# Patient Record
Sex: Male | Born: 1950
Health system: Southern US, Community
[De-identification: ages and names within clinical notes are randomized; demographics above are authoritative.]

## PROBLEM LIST (undated history)

## (undated) DIAGNOSIS — F10231 Alcohol dependence with withdrawal delirium: Secondary | ICD-10-CM

## (undated) DIAGNOSIS — I10 Essential (primary) hypertension: Secondary | ICD-10-CM

## (undated) DIAGNOSIS — E876 Hypokalemia: Secondary | ICD-10-CM

## (undated) DIAGNOSIS — E440 Moderate protein-calorie malnutrition: Secondary | ICD-10-CM

## (undated) HISTORY — PX: NO PAST SURGERIES: SHX2092

---
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
## 2015-03-06 ENCOUNTER — Encounter (HOSPITAL_COMMUNITY): Payer: Self-pay | Admitting: *Deleted

## 2015-03-06 ENCOUNTER — Emergency Department (HOSPITAL_COMMUNITY)
Admission: EM | Admit: 2015-03-06 | Discharge: 2015-03-06 | Disposition: A | Discharge status: Home / Self Care | Payer: Self-pay | Attending: Emergency Medicine | Admitting: Emergency Medicine

## 2015-03-06 DIAGNOSIS — Y9389 Activity, other specified: Secondary | ICD-10-CM | POA: Insufficient documentation

## 2015-03-06 DIAGNOSIS — Y9289 Other specified places as the place of occurrence of the external cause: Secondary | ICD-10-CM | POA: Insufficient documentation

## 2015-03-06 DIAGNOSIS — W458XXA Other foreign body or object entering through skin, initial encounter: Secondary | ICD-10-CM | POA: Insufficient documentation

## 2015-03-06 DIAGNOSIS — Y998 Other external cause status: Secondary | ICD-10-CM | POA: Insufficient documentation

## 2015-03-06 DIAGNOSIS — I10 Essential (primary) hypertension: Secondary | ICD-10-CM | POA: Insufficient documentation

## 2015-03-06 DIAGNOSIS — S30852A Superficial foreign body of penis, initial encounter: Secondary | ICD-10-CM | POA: Insufficient documentation

## 2015-03-06 DIAGNOSIS — S3994XA Unspecified injury of external genitals, initial encounter: Secondary | ICD-10-CM

## 2015-03-06 DIAGNOSIS — Z23 Encounter for immunization: Secondary | ICD-10-CM | POA: Insufficient documentation

## 2015-03-06 LAB — I-STAT CHEM 8, ED
BUN: 14 mg/dL (ref 6–20)
CALCIUM ION: 1.14 mmol/L (ref 1.13–1.30)
CHLORIDE: 103 mmol/L (ref 101–111)
CREATININE: 1 mg/dL (ref 0.61–1.24)
GLUCOSE: 117 mg/dL — AB (ref 65–99)
HCT: 47 % (ref 39.0–52.0)
Hemoglobin: 16 g/dL (ref 13.0–17.0)
POTASSIUM: 3.6 mmol/L (ref 3.5–5.1)
Sodium: 142 mmol/L (ref 135–145)
TCO2: 25 mmol/L (ref 0–100)

## 2015-03-06 LAB — CBC WITH DIFFERENTIAL/PLATELET
Basophils Absolute: 0 10*3/uL (ref 0.0–0.1)
Basophils Relative: 1 %
EOS PCT: 1 %
Eosinophils Absolute: 0.1 10*3/uL (ref 0.0–0.7)
HCT: 40.1 % (ref 39.0–52.0)
Hemoglobin: 13.7 g/dL (ref 13.0–17.0)
LYMPHS ABS: 1.6 10*3/uL (ref 0.7–4.0)
LYMPHS PCT: 32 %
MCH: 30.9 pg (ref 26.0–34.0)
MCHC: 34.2 g/dL (ref 30.0–36.0)
MCV: 90.3 fL (ref 78.0–100.0)
MONO ABS: 0.4 10*3/uL (ref 0.1–1.0)
Monocytes Relative: 8 %
Neutro Abs: 2.8 10*3/uL (ref 1.7–7.7)
Neutrophils Relative %: 58 %
PLATELETS: 263 10*3/uL (ref 150–400)
RBC: 4.44 MIL/uL (ref 4.22–5.81)
RDW: 14.6 % (ref 11.5–15.5)
WBC: 4.9 10*3/uL (ref 4.0–10.5)

## 2015-03-06 MED ORDER — TETANUS-DIPHTH-ACELL PERTUSSIS 5-2.5-18.5 LF-MCG/0.5 IM SUSP
0.5000 mL | Freq: Once | INTRAMUSCULAR | Status: AC
  Administered 2015-03-06: 0.5 mL via INTRAMUSCULAR
  Filled 2015-03-06: qty 0.5

## 2015-03-06 MED ORDER — CEPHALEXIN 500 MG PO CAPS: 500.0000 mg | ORAL_CAPSULE | Freq: Four times a day (QID) | ORAL | Status: DC

## 2015-03-06 MED ORDER — HYDROCHLOROTHIAZIDE 25 MG PO TABS: 25.0000 mg | ORAL_TABLET | Freq: Every day | ORAL | Status: DC

## 2015-03-06 MED ORDER — LIDOCAINE HCL (PF) 1 % IJ SOLN
30.0000 mL | Freq: Once | INTRAMUSCULAR | Status: AC
  Administered 2015-03-06: 30 mL
  Filled 2015-03-06: qty 30

## 2015-03-06 MED ORDER — BACITRACIN ZINC 500 UNIT/GM EX OINT
TOPICAL_OINTMENT | Freq: Two times a day (BID) | CUTANEOUS | Status: DC
  Administered 2015-03-06: 1 via TOPICAL
  Filled 2015-03-06: qty 0.9

## 2015-03-06 MED ORDER — HYDROCODONE-ACETAMINOPHEN 5-325 MG PO TABS: 1.0000 | ORAL_TABLET | ORAL | Status: DC | PRN

## 2015-03-06 MED ORDER — HYDROCHLOROTHIAZIDE 25 MG PO TABS
25.0000 mg | ORAL_TABLET | Freq: Every day | ORAL | Status: DC
  Administered 2015-03-06: 25 mg via ORAL
  Filled 2015-03-06: qty 1

## 2015-03-06 MED ORDER — BUPIVACAINE HCL (PF) 0.25 % IJ SOLN
20.0000 mL | Freq: Once | INTRAMUSCULAR | Status: DC
  Filled 2015-03-06: qty 20

## 2015-03-06 NOTE — ED Notes (Signed)
PA spoke with charge nurse. Will move pt to POD C when bed comes available.

## 2015-03-06 NOTE — Consult Note (Signed)
Urology Consult  Referring physician: Clayton Bibles, PA-C Reason for referral: Penile zipper injury  Chief Complaint: Zipper stuck to penile skin  History of Present Illness:  64 yo male who was zipping his pants yesterday at noon and caught his right distal penile shaft skin in his zipper. He was able to remove a portion of it over the past 24 hours but was unable to remove the last 2 cm of zipper and presents today. This has never happened before. The penis and zipper are tender to palpation. He denies fevers, dysuria, hematuria or other problems. He is circumcised.  History reviewed. No pertinent past medical history. History reviewed. No pertinent past surgical history.  Medications: I have reviewed the patient's current medications. Allergies: No Known Allergies  History reviewed. No pertinent family history. Social History:  reports that he has never smoked. He does not have any smokeless tobacco history on file. He reports that he drinks alcohol. He reports that he does not use illicit drugs.  ROS 12 system review was performed and was negative except for the pertinent positives listed in the HPI.  Physical Exam:  Vital signs in last 24 hours: Temp:  [98.3 F (36.8 C)-98.8 F (37.1 C)] 98.8 F (37.1 C) (11/04 1534) Pulse Rate:  [97-115] 115 (11/04 1534) Resp:  [16-18] 16 (11/04 1534) BP: (211-224)/(112-114) 224/112 mmHg (11/04 1534) SpO2:  [99 %-100 %] 99 % (11/04 1534) Weight:  [57.153 kg (126 lb)] 57.153 kg (126 lb) (11/04 1129) Physical Exam Genitourinary: Metal zipper is zipped to right distal shaft skin   Laboratory Data:  Results for orders placed or performed during the hospital encounter of 03/06/15 (from the past 72 hour(s))  CBC with Differential     Status: None   Collection Time: 03/06/15 12:54 PM  Result Value Ref Range   WBC 4.9 4.0 - 10.5 K/uL   RBC 4.44 4.22 - 5.81 MIL/uL   Hemoglobin 13.7 13.0 - 17.0 g/dL   HCT 40.1 39.0 - 52.0 %   MCV 90.3 78.0 - 100.0  fL   MCH 30.9 26.0 - 34.0 pg   MCHC 34.2 30.0 - 36.0 g/dL   RDW 14.6 11.5 - 15.5 %   Platelets 263 150 - 400 K/uL   Neutrophils Relative % 58 %   Neutro Abs 2.8 1.7 - 7.7 K/uL   Lymphocytes Relative 32 %   Lymphs Abs 1.6 0.7 - 4.0 K/uL   Monocytes Relative 8 %   Monocytes Absolute 0.4 0.1 - 1.0 K/uL   Eosinophils Relative 1 %   Eosinophils Absolute 0.1 0.0 - 0.7 K/uL   Basophils Relative 1 %   Basophils Absolute 0.0 0.0 - 0.1 K/uL  I-stat chem 8, ed     Status: Abnormal   Collection Time: 03/06/15  1:15 PM  Result Value Ref Range   Sodium 142 135 - 145 mmol/L   Potassium 3.6 3.5 - 5.1 mmol/L   Chloride 103 101 - 111 mmol/L   BUN 14 6 - 20 mg/dL   Creatinine, Ser 1.00 0.61 - 1.24 mg/dL   Glucose, Bld 117 (H) 65 - 99 mg/dL   Calcium, Ion 1.14 1.13 - 1.30 mmol/L   TCO2 25 0 - 100 mmol/L   Hemoglobin 16.0 13.0 - 17.0 g/dL   HCT 47.0 39.0 - 52.0 %   No results found for this or any previous visit (from the past 240 hour(s)). Creatinine:  Recent Labs  03/06/15 1315  CREATININE 1.00    Impression/Assessment:  64  yo M with zipper stuck to right lateral penile shaft skin.   Procedure: The patient was prepped and draped with betadine and sterile towels at the bedside. We performed a dorsal and ring block with 20 mL of 0.25% marcaine plain. We were able to slowly manipulate the zipper off the preputial skin with a resulting 4 cm x 0.5 cm defect in the skin. Additional betadine was used to clean the defect. The edges were debrided sharply and excellent hemostasis was achieved with eye cautery. The incision was closed vertically with interrupted 4-0 chromic with excellent hemostasis and cosmetic effect. We made sure to sharply trim the dog ear at each end prior to closing it. This was dressed with bacitracin, xeroform gauze, kerlix and coban.  Plan:  1. Discharge to home on 7 day course of Keflex or bactrim 2. Remove dressing tomorrow 3. Ok to use ice packs as needed over next 24  hours 4. Ok to shower no bathing for 2 weeks 5. No sexual activity for 4 weeks 6. Follow up with Dr. Junious Silk in 1-2 weeks for a post-op check  Acie Fredrickson 03/06/2015, 3:48 PM

## 2015-03-06 NOTE — ED Provider Notes (Signed)
CSN: [id number]     Arrival date & time 03/06/15  1121 History  By signing my name below, I, Soijett Blue, attest that this documentation has been prepared under the direction and in the presence of Clayton Bibles, PA-C Electronically Signed: Soijett Blue, ED Scribe. 03/06/2015. 12:46 PM.   Chief Complaint  Patient presents with  . Penis Injury      The history is provided by the patient. No language interpreter was used.    HPI Comments: Dan Henry is a 64 y.o. male who presents to the Emergency Department complaining of penis injury onset yesterday. He reports that he was zipping his pants up when he accidentally got his penis caught on the zipper. He notes that he does not have any pain at this time and that he cut off his pants but the zipper is still attached to his penis. He states that he has not tried any medications for the relief for his symptoms. He denies CP, SOB, confusion, weakness or numbness of the extremities, and any other symptoms. He denies having a PCP at this time and he denies ever taking medication for his high blood pressure.   History reviewed. No pertinent past medical history. History reviewed. No pertinent past surgical history. History reviewed. No pertinent family history. Social History  Substance Use Topics  . Smoking status: Never Smoker   . Smokeless tobacco: None  . Alcohol Use: Yes     Comment: occ    Review of Systems  Constitutional: Negative for fever.  Genitourinary: Negative for scrotal swelling and testicular pain.       Zipped up penis with zipper attached  Skin: Negative for color change and rash.  Allergic/Immunologic: Negative for immunocompromised state.  Neurological: Negative for numbness.  Hematological: Does not bruise/bleed easily.  Psychiatric/Behavioral: Negative for self-injury (accidental).      Allergies  Review of patient's allergies indicates no known allergies.  Home Medications   Prior to Admission medications    Not on File   BP 211/112 mmHg  Pulse 97  Temp(Src) 98.3 F (36.8 C) (Oral)  Resp 18  Ht 5\' 5"  (1.651 m)  Wt 126 lb (57.153 kg)  BMI 20.97 kg/m2  SpO2 100% Physical Exam  Constitutional: He appears well-developed and well-nourished. No distress.  HENT:  Head: Normocephalic and atraumatic.  Neck: Neck supple.  Pulmonary/Chest: Effort normal.  Genitourinary:     Metal zipper zipped into foreskin of right side of penis, front of zipper is flush with skin.   Neurological: He is alert.  Skin: He is not diaphoretic.  Nursing note and vitals reviewed.   ED Course  Procedures (including critical care time) DIAGNOSTIC STUDIES: Oxygen Saturation is 100% on RA, nl by my interpretation.    COORDINATION OF CARE: 12:36 PM Discussed treatment plan with pt at bedside which includes I-stat Chem 8, consult with attending Dr. Colin Rhein and pt agreed to plan.  12:49 PM-Dr Colin Rhein has seen and evaluated the patient, advised urology consultation.    1:20 PM- Consult with Urologist, Dr. Junious Silk who recommends that the pt be made NPO and he will see the pt in the ED   Labs Review Labs Reviewed  I-STAT CHEM 8, ED - Abnormal; Notable for the following:    Glucose, Bld 117 (*)    All other components within normal limits  CBC WITH DIFFERENTIAL/PLATELET    Imaging Review No results found. I have personally reviewed and evaluated these lab results as part of my medical decision-making.  EKG Interpretation None         MDM   Final diagnoses:  Superficial foreign body of penis, initial encounter  Essential hypertension  Penile trauma, initial encounter    Afebrile, nontoxic patient with accidental trauma to penis with skin of penis caught in zipper.  Please see urology note regarding repair of this, performed in ED.  Pt also noted to be hypertensive.  Pt asymptomatic.  Started on HCTZ.   D/C home with HCTZ, keflex per urology, norco, urology follow up - instructions per urology.   Discussed result, findings, treatment, and follow up  with patient.  Pt given return precautions.  Pt verbalizes understanding and agrees with plan.     I personally performed the services described in this documentation, which was scribed in my presence. The recorded information has been reviewed and is accurate.    Clayton Bibles, PA-C 03/06/15 1703  Debby Freiberg, MD 03/12/15 [phone]

## 2015-03-06 NOTE — Discharge Instructions (Signed)
Read the information below.  Use the prescribed medication as directed.  Please discuss all new medications with your pharmacist.  Do not take additional tylenol while taking the prescribed pain medication to avoid overdose.  You may return to the Emergency Department at any time for worsening condition or any new symptoms that concern you.    If you develop redness, swelling, pus draining from the wound, or fevers greater than 100.4, return to the ER immediately for a recheck.    Keep the bandage on for the next 24 hours.  You may shower but do not bathe until you see the urologist Dr Junious Silk in his office. No sexual activity for the next 6 weeks.    Please have your blood pressure rechecked within the week.  Follow up with a primary care provider as soon as possible to discuss this.  Hypertension Hypertension is another name for high blood pressure. High blood pressure forces your heart to work harder to pump blood. A blood pressure reading has two numbers, which includes a higher number over a lower number (example: 110/72). HOME CARE   Have your blood pressure rechecked by your doctor.  Only take medicine as told by your doctor. Follow the directions carefully. The medicine does not work as well if you skip doses. Skipping doses also puts you at risk for problems.  Do not smoke.  Monitor your blood pressure at home as told by your doctor. GET HELP IF:  You think you are having a reaction to the medicine you are taking.  You have repeat headaches or feel dizzy.  You have puffiness (swelling) in your ankles.  You have trouble with your vision. GET HELP RIGHT AWAY IF:   You get a very bad headache and are confused.  You feel weak, numb, or faint.  You get chest or belly (abdominal) pain.  You throw up (vomit).  You cannot breathe very well. MAKE SURE YOU:   Understand these instructions.  Will watch your condition.  Will get help right away if you are not doing well or  get worse.   This information is not intended to replace advice given to you by your health care provider. Make sure you discuss any questions you have with your health care provider.   Document Released: 10/05/2007 Document Revised: 04/23/2013 Document Reviewed: 02/08/2013 Elsevier Interactive Patient Education 2016 [employer].   Emergency Department Resource Guide 1) Find a Doctor and Pay Out of Pocket Although you won't have to find out who is covered by your insurance plan, it is a good idea to ask around and get recommendations. You will then need to call the office and see if the doctor you have chosen will accept you as a new patient and what types of options they offer for patients who are self-pay. Some doctors offer discounts or will set up payment plans for their patients who do not have insurance, but you will need to ask so you aren't surprised when you get to your appointment.  2) Contact Your Local Health Department Not all health departments have doctors that can see patients for sick visits, but many do, so it is worth a call to see if yours does. If you don't know where your local health department is, you can check in your phone book. The CDC also has a tool to help you locate your state's health department, and many state websites also have listings of all of their local health departments.  3) Find a  Walk-in Clinic If your illness is not likely to be very severe or complicated, you may want to try a walk in clinic. These are popping up all over the country in pharmacies, drugstores, and shopping centers. They're usually staffed by nurse practitioners or physician assistants that have been trained to treat common illnesses and complaints. They're usually fairly quick and inexpensive. However, if you have serious medical issues or chronic medical problems, these are probably not your best option.  No Primary Care Doctor: - Call Health Connect at  [phone] - they can help you  locate a primary care doctor that  accepts your insurance, provides certain services, etc. - Physician Referral Service- [phone]  Chronic Pain Problems: Organization         Address  Phone   Notes  [facility]  [phone] Patients need to be referred by their primary care doctor.   Medication Assistance: Organization         Address  Phone   Notes  [facility] Medication [facility] [address] [place], [place] [address] [phone] --Must be a resident of [facility] -- Must have NO insurance coverage whatsoever (no Medicaid/ Medicare, etc.) -- The pt. MUST have a primary care doctor that directs their care regularly and follows them in the community   [facility]ist  [phone]   [employer]  [phone]    Agencies that provide inexpensive medical care: Organization         Address  Phone   Notes  [place]  [phone]   [facility]    [phone]   [facility] [place], [place] [address] [phone]   [place] [address] [place] [phone]   Planned Parenthood    [phone]   [facility]    [phone]   [place] and [place] [address], [place] Phone:  [phone], Fax:  [phone] Hours of Operation:  9 am - 6 pm, M-F.  Also accepts Medicaid/Medicare and self-pay.  [facility] for [place], [address], [place] Phone: [phone], Fax: [phone]. Hours of Operation:  8:30 am - 5:30 pm, M-F.  Also accepts Medicaid and self-pay.  [facility] [address], [place] Phone: [phone]   [place], [place], [place] [phone], Ext. [phone] Mondays & Thursdays: 7-9 AM.  First 15 patients are seen on a first come, first serve basis.    [place]  Providers:  Organization         Address  Phone   Notes  [facility] [address], [place] [phone] Also accepts self-pay patients.  [facility] [address], [place]  [phone]   [place], [address], [place] [phone]   [facility] [address], [place] [phone]   Lucianne Lei [address], [place]   [phone] Only accepts [place] Access [place] patients after they have their name applied to their card.   Self-Pay (no insurance) in [facility]:  Organization         Address  Phone   Notes  Sickle Cell Patients, [facility] Internal [facility] [place],  [place] [phone]   [facility] [place] [phone]   [facility] [place], [place], [place] [phone]   [facility]/Dr. Osei-Bonsu  [address] [place] or [address], [place] [phone] Phone number for both [place] and [place] locations is the same.  Urgent Medical and [facility] [address] [place] [phone]   [facility] [address], [place] or [address] [phone]   [facility] [address], [place] [phone], phone; [phone], fax Sees patients 1st and 3rd Saturday of every month.  Must not qualify for public or private insurance (i.e. Medicaid, Medicare, [place] Health Choice, Veterans' Benefits)  Household income should be no more than 200% of the poverty level The clinic cannot treat you if you are pregnant or think you are pregnant  Sexually transmitted diseases are not treated at the clinic.    Dental Care: Organization         Address  Phone  Notes  [facility] Department of [facility] [place] [phone] Accepts children up to age 29 who are enrolled in [place] or [place]; pregnant women with a Medicaid card; and children who have applied for Medicaid or [place] Health Choice, but were declined, whose parents can pay a reduced fee at time of service.  [facility] Department of [facility]  [address], [place] [phone] Accepts children up to age 74 who are enrolled in [place] or [place]; pregnant women with a Medicaid card; and children who have applied for Medicaid or [place] Health Choice, but were declined, whose parents can pay a reduced fee at time of service.  [place] Adult Dental Access PROGRAM  [place] [phone] Patients are seen by appointment only. Walk-ins are not accepted. [place] will see patients 60 years of age and older. Monday - Tuesday (8am-5pm) Most Wednesdays (8:30-5pm) $30 per visit, cash only  [facility] Adult Dental Access PROGRAM  [address], [facility] [phone] Patients are seen by appointment only. Walk-ins are not accepted. [place] will see patients 67 years of age and older. One Wednesday Evening (Monthly: Volunteer Based).  $30 per visit, cash only  [place]  [phone] for adults; Children under age 37, call Graduate Pediatric Dentistry at [phone]. Children aged 16-14, please call [phone] to request a pediatric application.  Dental services are provided in all areas of dental care including fillings, crowns and bridges, complete and partial dentures, implants, gum treatment, root canals, and extractions. Preventive care is also provided. Treatment is provided to both adults and children. Patients are selected via a lottery and there is often a waiting list.   [facility] [address], [place]  [phone] [email or web address]   [facility]  [address] [place], [place] [phone], Ext. [phone] Second and Fourth Thursday of each month, opens at 6:30 AM; Clinic ends at 9 AM.  Patients are seen on a first-come first-served basis, and a limited number are seen during each clinic.   [facility]  [address] Hillard Danker [place], [place] [phone]   Eligibility Requirements You must have lived in [place], [place], or [place]  counties for at least the last three months.   You cannot be eligible for state or federal sponsored [employer], including [employer], [place], or [employer].   You generally cannot be eligible for healthcare insurance through your employer.    How to apply: Eligibility screenings are held every Tuesday and Wednesday afternoon from 1:00 pm until 4:00 pm. You do not need an appointment for the interview!  [facility] [address] [place], [place]   [place]  [place] Department  [place]  [phone]    Behavioral Health Resources in the Community: Intensive Outpatient Programs Organization         Address  Phone  Notes  [place] [place]. [address] [place], [place] [phone]   [facility] Outpatient [address] [place], [place]   ADS: Alcohol & Drug Svcs [address] [place], [place]   [place] [address]  [place], [place] or [phone]   Substance Abuse Resources Organization         Address  Phone  Notes  Alcohol and Drug Services  3084070831   [place]  [phone]   [facility] [place]   Chinita Pester  [phone]   Residential & Outpatient Substance Abuse Program  [phone]   Psychological Services Organization         Address  Phone  Notes  [facility] [place]  [place]  [phone]   [place] [address], [place] or [phone]    Mobile Crisis Teams Organization         Address  Phone  Notes  [facility]atives, Mobile Crisis Care Unit  [phone]   [facility]ive Psychotherapeutic Services  [address] [place], [place]   [place] [address] [place] [phone]    Self-Help/Support Groups Organization         Address  Phone             Notes  [place]. of [place] - variety of support groups  [place] Call for more information  Narcotics Anonymous (NA), [facility] [address], [employer] [place]  2 meetings at this location   Special educational needs teacher         Address  Phone  Notes  [facility] Residential Treatment [place],    [place]  [phone]   [facility]  [address], [place] [phone], [place], [place]   [place], [place] [phone] Admissions: 8am-3pm M-F  Incentives Substance [place] [address]    [place], [place] [phone]   [facility] [address] [place], [place], [place]   [facility] [address],  [place], [place]   [facility] Programs - Intensive Outpatient [address], [place], [place]   [facility] [place].,  [place], [place] or [phone]   [facility] Services (RTS) [address] [place], [place] Accepts Medicaid  [place] [address]  [place] [phone] Substance Abuse/Addiction Treatment   [facility] Resources Organization         Address  Phone  Notes  Lexicographer Services  (  [phone]   Domenic Schwab, PhD [address] [place], [place]   [phone] or [phone]    [name], [place] [phone]   [address], [place], [place] [phone] Insurance/Medicaid/sponsorship through [facility] and Families [address]                                     [place], [place] [phone] [place] [address], [place] [phone]    Dr. Adele Schilder  [phone]   [facility] Dept. 1) [address] [place] 2) [place] 3)  [address], [place] [phone]  [phone]   [place] [phone] or [phone] (After Hours)

## 2015-03-06 NOTE — ED Notes (Addendum)
Pt reports getting dressed yesterday, accidentally injured penis on zipper. Denies any bleeding, only pain. Hypertensive at triage.

## 2015-03-30 ENCOUNTER — Ambulatory Visit: Payer: Self-pay

## 2015-04-02 ENCOUNTER — Ambulatory Visit: Payer: Self-pay

## 2015-05-21 ENCOUNTER — Ambulatory Visit (INDEPENDENT_AMBULATORY_CARE_PROVIDER_SITE_OTHER): Payer: Self-pay | Admitting: Internal Medicine

## 2015-05-21 ENCOUNTER — Encounter: Payer: Self-pay | Admitting: Internal Medicine

## 2015-05-21 VITALS — BP 219/122 | HR 103 | Temp 97.8°F | Ht 65.5 in | Wt 121.3 lb

## 2015-05-21 DIAGNOSIS — I10 Essential (primary) hypertension: Secondary | ICD-10-CM

## 2015-05-21 DIAGNOSIS — Z0000 Encounter for general adult medical examination without abnormal findings: Secondary | ICD-10-CM

## 2015-05-21 MED ORDER — LISINOPRIL-HYDROCHLOROTHIAZIDE 10-12.5 MG PO TABS: 1.0000 | ORAL_TABLET | Freq: Every day | ORAL | Status: DC

## 2015-05-21 NOTE — Progress Notes (Signed)
Case discussed with Dr. Hoffman at the time of the visit.  We reviewed the resident's history and exam and pertinent patient test results.  I agree with the assessment, diagnosis and plan of care documented in the resident's note. 

## 2015-05-21 NOTE — Patient Instructions (Signed)
General Instructions:  I want you to start taking Lisinopril-HCTZ 10-12.5mg  daily and return in 2 weeks  Please bring your medicines with you each time you come to clinic.  Medicines may include prescription medications, over-the-counter medications, herbal remedies, eye drops, vitamins, or other pills.   Progress Toward Treatment Goals:  No flowsheet data found.  Self Care Goals & Plans:  No flowsheet data found.  No flowsheet data found.   Care Management & Community Referrals:  No flowsheet data found.     Hypertension Hypertension, commonly called high blood pressure, is when the force of blood pumping through your arteries is too strong. Your arteries are the blood vessels that carry blood from your heart throughout your body. A blood pressure reading consists of a higher number over a lower number, such as 110/72. The higher number (systolic) is the pressure inside your arteries when your heart pumps. The lower number (diastolic) is the pressure inside your arteries when your heart relaxes. Ideally you want your blood pressure below 120/80. Hypertension forces your heart to work harder to pump blood. Your arteries may become narrow or stiff. Having untreated or uncontrolled hypertension can cause heart attack, stroke, kidney disease, and other problems. RISK FACTORS Some risk factors for high blood pressure are controllable. Others are not.  Risk factors you cannot control include:   Race. You may be at higher risk if you are African American.  Age. Risk increases with age.  Gender. Men are at higher risk than women before age 61 years. After age 55, women are at higher risk than men. Risk factors you can control include:  Not getting enough exercise or physical activity.  Being overweight.  Getting too much fat, sugar, calories, or salt in your diet.  Drinking too much alcohol. SIGNS AND SYMPTOMS Hypertension does not usually cause signs or symptoms. Extremely  high blood pressure (hypertensive crisis) may cause headache, anxiety, shortness of breath, and nosebleed. DIAGNOSIS To check if you have hypertension, your health care provider will measure your blood pressure while you are seated, with your arm held at the level of your heart. It should be measured at least twice using the same arm. Certain conditions can cause a difference in blood pressure between your right and left arms. A blood pressure reading that is higher than normal on one occasion does not mean that you need treatment. If it is not clear whether you have high blood pressure, you may be asked to return on a different day to have your blood pressure checked again. Or, you may be asked to monitor your blood pressure at home for 1 or more weeks. TREATMENT Treating high blood pressure includes making lifestyle changes and possibly taking medicine. Living a healthy lifestyle can help lower high blood pressure. You may need to change some of your habits. Lifestyle changes may include:  Following the DASH diet. This diet is high in fruits, vegetables, and whole grains. It is low in salt, red meat, and added sugars.  Keep your sodium intake below 2,300 mg per day.  Getting at least 30-45 minutes of aerobic exercise at least 4 times per week.  Losing weight if necessary.  Not smoking.  Limiting alcoholic beverages.  Learning ways to reduce stress. Your health care provider may prescribe medicine if lifestyle changes are not enough to get your blood pressure under control, and if one of the following is true:  You are 71-76 years of age and your systolic blood pressure is above 140.  You are 43 years of age or older, and your systolic blood pressure is above 150.  Your diastolic blood pressure is above 90.  You have diabetes, and your systolic blood pressure is over [id number] or your diastolic blood pressure is over 90.  You have kidney disease and your blood pressure is above  140/90.  You have heart disease and your blood pressure is above 140/90. Your personal target blood pressure may vary depending on your medical conditions, your age, and other factors. HOME CARE INSTRUCTIONS  Have your blood pressure rechecked as directed by your health care provider.   Take medicines only as directed by your health care provider. Follow the directions carefully. Blood pressure medicines must be taken as prescribed. The medicine does not work as well when you skip doses. Skipping doses also puts you at risk for problems.  Do not smoke.   Monitor your blood pressure at home as directed by your health care provider. SEEK MEDICAL CARE IF:   You think you are having a reaction to medicines taken.  You have recurrent headaches or feel dizzy.  You have swelling in your ankles.  You have trouble with your vision. SEEK IMMEDIATE MEDICAL CARE IF:  You develop a severe headache or confusion.  You have unusual weakness, numbness, or feel faint.  You have severe chest or abdominal pain.  You vomit repeatedly.  You have trouble breathing. MAKE SURE YOU:   Understand these instructions.  Will watch your condition.  Will get help right away if you are not doing well or get worse.   This information is not intended to replace advice given to you by your health care provider. Make sure you discuss any questions you have with your health care provider.   Document Released: 04/18/2005 Document Revised: 09/02/2014 Document Reviewed: 02/08/2013 Elsevier Interactive Patient Education [employer].

## 2015-05-21 NOTE — Assessment & Plan Note (Signed)
A: Essential Hypertension, uncontrolled  P: Will start patient on lisinopril-HCTZ 10-12.5 and have close follow up in 2 weeks.  Can then titrate both medications up and monitor labs

## 2015-05-21 NOTE — Addendum Note (Signed)
Addended by: Oval Linsey D on: 05/21/2015 04:16 PM   Modules accepted: Level of Service

## 2015-05-21 NOTE — Assessment & Plan Note (Signed)
-  Patient is currently uninsured and wishes to delay non urgent issues till February when he will be eligible for medicare.

## 2015-05-21 NOTE — Progress Notes (Signed)
[facility] Subjective:   Patient ID: Dan Henry male   DOB: 30-Oct-1950 65 y.o.   MRN: [id number]  HPI: Dan Henry is a 65 y.o. male with no known PMH and no surgical history who presents to establish care.  He was seen in the ED about 2 months ago after a laceration of the penis when it was caught in his zipper.  That has healed well however he was noted to have high blood pressure at that time.  He was given a 1 month Rx of HCTZ which he took.  He however has run out and he checked his pressure at the pharmacy and it was still elevated at about 200/110.  He has a very significant family history of HTN.  He denies any headaches, blurry vision, changes in vision.      No past medical history on file. Current Outpatient Prescriptions  Medication Sig Dispense Refill  . lisinopril-hydrochlorothiazide (ZESTORETIC) 10-12.5 MG tablet Take 1 tablet by mouth daily. 30 tablet 2   No current facility-administered medications for this visit.   Family History  Problem Relation Age of Onset  . Hypertension Mother   . Hypertension Father   . Hypertension Sister   . Hypertension Sister   . Stroke Sister   . Cancer Brother     throat   Social History   Social History  . Marital Status: Single    Spouse Name: N/A  . Number of Children: N/A  . Years of Education: N/A   Social History Main Topics  . Smoking status: Never Smoker   . Smokeless tobacco: None  . Alcohol Use: Yes     Comment: occ  . Drug Use: No  . Sexual Activity: Not Asked   Other Topics Concern  . 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  HENT: Positive for nosebleeds (occasionally last one in november).   Eyes: Negative for blurred vision and photophobia.  Respiratory: Negative for cough and shortness of breath.    Cardiovascular: Negative for chest pain and leg swelling.  Gastrointestinal: Negative for heartburn and abdominal pain.  Genitourinary: Negative for dysuria and frequency.  Musculoskeletal: Negative for falls.  Neurological: Negative for dizziness and headaches.  Endo/Heme/Allergies: Negative for polydipsia.  Psychiatric/Behavioral: Negative for substance abuse.     Objective:  Physical Exam: Filed Vitals:   05/21/15 1046  BP: 219/122  Pulse: 103  Temp: 97.8 F (36.6 C)  TempSrc: Oral  Height: 5' 5.5" (1.664 m)  Weight: 121 lb 4.8 oz (55.021 kg)  SpO2: 100%  Physical Exam  Constitutional: He is oriented to person, place, and time and well-developed, well-nourished, and in no distress.  HENT:  Head: Normocephalic and atraumatic.  Cardiovascular: Normal rate and regular rhythm.   Pulmonary/Chest: Effort normal and breath sounds normal.  Abdominal: Soft. Bowel sounds are normal.  Musculoskeletal: He exhibits no edema.  Neurological: He is alert and oriented to person, place, and time.  Skin: Skin is warm and dry.  Psychiatric: Mood and affect normal.  Nursing note and vitals reviewed.   Assessment & Plan:  Case discussed with Dr. Eppie Gibson  Essential hypertension A: Essential Hypertension, uncontrolled  P: Will start patient on lisinopril-HCTZ 10-12.5 and have close follow up in 2 weeks.  Can then titrate both medications up and monitor labs  Healthcare maintenance -  Patient is currently uninsured and wishes to delay non urgent issues till February when he will be eligible for medicare.    Medications Ordered Meds ordered this encounter  Medications  . lisinopril-hydrochlorothiazide (ZESTORETIC) 10-12.5 MG tablet    Sig: Take 1 tablet by mouth daily.    Dispense:  30 tablet    Refill:  2   Other Orders No orders of the defined types were placed in this encounter.   Follow Up: Return in about 2 weeks (around 06/04/2015).

## 2015-06-04 ENCOUNTER — Ambulatory Visit (INDEPENDENT_AMBULATORY_CARE_PROVIDER_SITE_OTHER): Payer: Self-pay | Admitting: Internal Medicine

## 2015-06-04 ENCOUNTER — Ambulatory Visit: Payer: Self-pay

## 2015-06-04 ENCOUNTER — Encounter: Payer: Self-pay | Admitting: Internal Medicine

## 2015-06-04 VITALS — BP 139/84 | HR 76 | Temp 97.7°F | Wt 121.5 lb

## 2015-06-04 DIAGNOSIS — I10 Essential (primary) hypertension: Secondary | ICD-10-CM

## 2015-06-04 DIAGNOSIS — Z79899 Other long term (current) drug therapy: Secondary | ICD-10-CM

## 2015-06-04 NOTE — Progress Notes (Signed)
Dan Henry Subjective:   Patient ID: Dan Henry male   DOB: Sep 07, 1950 65 y.o.   MRN: Dan Henry  HPI: Dan Henry is a 65 y.o. male with PMH of HTN who presents for 2 week follow up of uncontrolled hypertension.  Please see problem based charting below for the status of his chronic medical problems.    No past medical history on file. Current Outpatient Prescriptions  Medication Sig Dispense Refill  . lisinopril-hydrochlorothiazide (ZESTORETIC) 10-12.5 MG tablet Take 1 tablet by mouth daily. 30 tablet 2   No current facility-administered medications for this visit.   Family History  Problem Relation Age of Onset  . Hypertension Mother   . Hypertension Father   . Hypertension Sister   . Hypertension Sister   . Stroke Sister   . Cancer Brother     throat   Social History   Social History  . Marital Status: Single    Spouse Name: N/A  . Number of Children: N/A  . Years of Education: N/A   Social History Main Topics  . Smoking status: Never Smoker   . Smokeless tobacco: None  . Alcohol Use: 0.0 oz/week    0 Standard drinks or equivalent per week     Comment: occ  . Drug Use: No  . Sexual Activity: Not Asked   Other Topics Concern  . 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   Eyes: Negative for blurred vision.  Respiratory: Negative for cough and shortness of breath.   Neurological: Negative for dizziness and headaches.     Objective:  Physical Exam: Filed Vitals:   06/04/15 1026  BP: 139/84  Pulse: 76  Temp: 97.7 F (36.5 C)  TempSrc: Oral  Weight: 121 lb 8 oz (55.112 kg)  SpO2: 100%  Physical Exam  Constitutional: He is well-developed, well-nourished, and in no distress.  Cardiovascular: Normal rate and regular rhythm.   Pulmonary/Chest: Effort normal and breath sounds normal.   Musculoskeletal: He exhibits no edema.  Nursing note and vitals reviewed.   Assessment & Plan:  Case discussed with Dr. Dareen Piano  Essential hypertension HPI: He was started on Lisinopril-HCTZ 10-12.5 2 weeks ago.  He is tolerating the medication well without side effects.  He has not had another nosebleed since starting the medication.  He denies HA, blurry vision, and cough.  A: Essential Hypertension- controlled  P: - He has responded remarkably well to lose dose Lisinopril-HCTZ 10-12.5mg .  We will continue this medication.  I will repeat a BMP today, and see him for follow up in 3 months, at that time he will be eligible for medicare and we can get a lipid panel as well as his overdue healthcare maintainence.    Medications Ordered No orders of the defined types were placed in this encounter.   Other Orders Orders Placed This Encounter  Procedures  . BMP8+Anion Gap   Follow Up: Return in about 3 months (around 09/01/2015).

## 2015-06-04 NOTE — Assessment & Plan Note (Addendum)
HPI: He was started on Lisinopril-HCTZ 10-12.5 2 weeks ago.  He is tolerating the medication well without side effects.  He has not had another nosebleed since starting the medication.  He denies HA, blurry vision, and cough.  A: Essential Hypertension- controlled  P: - He has responded remarkably well to lose dose Lisinopril-HCTZ 10-12.5mg .  We will continue this medication.  I will repeat a BMP today, and see him for follow up in 3 months, at that time he will be eligible for medicare and we can get a lipid panel as well as his overdue healthcare maintainence.

## 2015-06-04 NOTE — Progress Notes (Signed)
Internal Medicine Clinic Attending  Case discussed with Dr. Hoffman soon after the resident saw the patient.  We reviewed the resident's history and exam and pertinent patient test results.  I agree with the assessment, diagnosis, and plan of care documented in the resident's note. 

## 2015-06-04 NOTE — Patient Instructions (Signed)
General Instructions:  If you are close to running out of medication before your next appointment request refills from your pharmacy.  Keep up the good work and we will see you in May  Please bring your medicines with you each time you come to clinic.  Medicines may include prescription medications, over-the-counter medications, herbal remedies, eye drops, vitamins, or other pills.   Progress Toward Treatment Goals:  Treatment Goal 06/04/2015  Blood pressure at goal    Self Care Goals & Plans:  Self Care Goal 06/04/2015  Manage my medications take my medicines as prescribed; bring my medications to every visit; refill my medications on time  Monitor my health keep track of my blood pressure  Eat healthy foods eat more vegetables; eat foods that are low in salt; eat baked foods instead of fried foods  Be physically active find an activity I enjoy    No flowsheet data found.   Care Management & Community Referrals:  Referral 06/04/2015  Referrals made for care management support none needed

## 2015-06-05 LAB — BMP8+ANION GAP
ANION GAP: 21 mmol/L — AB (ref 10.0–18.0)
BUN/Creatinine Ratio: 13 (ref 10–22)
BUN: 17 mg/dL (ref 8–27)
CO2: 21 mmol/L (ref 18–29)
CREATININE: 1.29 mg/dL — AB (ref 0.76–1.27)
Calcium: 9.3 mg/dL (ref 8.6–10.2)
Chloride: 103 mmol/L (ref 96–106)
GFR calc Af Amer: 67 mL/min/{1.73_m2} (ref >59)
GFR calc non Af Amer: 58 mL/min/{1.73_m2} — ABNORMAL LOW (ref >59)
Glucose: 98 mg/dL (ref 65–99)
Potassium: 4.1 mmol/L (ref 3.5–5.2)
SODIUM: 145 mmol/L — AB (ref 134–144)

## 2015-06-05 NOTE — Addendum Note (Signed)
Addended by: Joni Reining C on: 06/05/2015 12:59 PM   Modules accepted: Orders

## 2015-06-08 ENCOUNTER — Telehealth: Payer: Self-pay | Admitting: Internal Medicine

## 2015-06-08 NOTE — Telephone Encounter (Signed)
APPT. REMINDER CALL, LMTCB IF HE NEEDS TO CANCEL °

## 2015-06-09 ENCOUNTER — Ambulatory Visit: Payer: Self-pay

## 2015-06-11 ENCOUNTER — Ambulatory Visit: Payer: Self-pay

## 2015-06-18 ENCOUNTER — Other Ambulatory Visit (INDEPENDENT_AMBULATORY_CARE_PROVIDER_SITE_OTHER): Payer: Self-pay

## 2015-06-18 ENCOUNTER — Ambulatory Visit: Payer: Self-pay

## 2015-06-18 DIAGNOSIS — I10 Essential (primary) hypertension: Secondary | ICD-10-CM

## 2015-06-19 LAB — BMP8+ANION GAP
Anion Gap: 19 mmol/L — ABNORMAL HIGH (ref 10.0–18.0)
BUN / CREAT RATIO: 18 (ref 10–22)
BUN: 17 mg/dL (ref 8–27)
CHLORIDE: 99 mmol/L (ref 96–106)
CO2: 23 mmol/L (ref 18–29)
Calcium: 9.6 mg/dL (ref 8.6–10.2)
Creatinine, Ser: 0.97 mg/dL (ref 0.76–1.27)
GFR calc Af Amer: 95 mL/min/{1.73_m2} (ref >59)
GFR calc non Af Amer: 82 mL/min/{1.73_m2} (ref >59)
GLUCOSE: 96 mg/dL (ref 65–99)
Potassium: 4 mmol/L (ref 3.5–5.2)
SODIUM: 141 mmol/L (ref 134–144)

## 2017-04-25 ENCOUNTER — Other Ambulatory Visit: Payer: Self-pay

## 2017-04-25 ENCOUNTER — Encounter (HOSPITAL_COMMUNITY): Payer: Self-pay | Admitting: Emergency Medicine

## 2017-04-25 ENCOUNTER — Emergency Department (HOSPITAL_COMMUNITY): Payer: Medicare Other

## 2017-04-25 ENCOUNTER — Emergency Department (HOSPITAL_COMMUNITY)
Admission: EM | Admit: 2017-04-25 | Discharge: 2017-04-25 | Disposition: A | Discharge status: Home / Self Care | Payer: Medicare Other | Attending: Emergency Medicine | Admitting: Emergency Medicine

## 2017-04-25 DIAGNOSIS — Z79899 Other long term (current) drug therapy: Secondary | ICD-10-CM | POA: Insufficient documentation

## 2017-04-25 DIAGNOSIS — R42 Dizziness and giddiness: Secondary | ICD-10-CM | POA: Diagnosis not present

## 2017-04-25 DIAGNOSIS — I10 Essential (primary) hypertension: Secondary | ICD-10-CM | POA: Diagnosis not present

## 2017-04-25 DIAGNOSIS — R61 Generalized hyperhidrosis: Secondary | ICD-10-CM | POA: Diagnosis not present

## 2017-04-25 DIAGNOSIS — R509 Fever, unspecified: Secondary | ICD-10-CM | POA: Diagnosis not present

## 2017-04-25 DIAGNOSIS — R251 Tremor, unspecified: Secondary | ICD-10-CM | POA: Diagnosis not present

## 2017-04-25 DIAGNOSIS — I160 Hypertensive urgency: Secondary | ICD-10-CM | POA: Insufficient documentation

## 2017-04-25 DIAGNOSIS — R1084 Generalized abdominal pain: Secondary | ICD-10-CM | POA: Diagnosis not present

## 2017-04-25 HISTORY — DX: Essential (primary) hypertension: I10

## 2017-04-25 LAB — DIFFERENTIAL
BASOS PCT: 0 %
Basophils Absolute: 0 10*3/uL (ref 0.0–0.1)
EOS ABS: 0 10*3/uL (ref 0.0–0.7)
EOS PCT: 0 %
Lymphocytes Relative: 14 %
Lymphs Abs: 1.3 10*3/uL (ref 0.7–4.0)
MONO ABS: 0.9 10*3/uL (ref 0.1–1.0)
Monocytes Relative: 10 %
NEUTROS PCT: 76 %
Neutro Abs: 7.2 10*3/uL (ref 1.7–7.7)

## 2017-04-25 LAB — COMPREHENSIVE METABOLIC PANEL
ALBUMIN: 4.6 g/dL (ref 3.5–5.0)
ALK PHOS: 69 U/L (ref 38–126)
ALT: 43 U/L (ref 17–63)
ANION GAP: 17 — AB (ref 5–15)
AST: 119 U/L — ABNORMAL HIGH (ref 15–41)
BUN: 18 mg/dL (ref 6–20)
CALCIUM: 9.4 mg/dL (ref 8.9–10.3)
CHLORIDE: 99 mmol/L — AB (ref 101–111)
CO2: 19 mmol/L — AB (ref 22–32)
Creatinine, Ser: 1.85 mg/dL — ABNORMAL HIGH (ref 0.61–1.24)
GFR calc non Af Amer: 36 mL/min — ABNORMAL LOW (ref >60)
GFR, EST AFRICAN AMERICAN: 42 mL/min — AB (ref >60)
Glucose, Bld: 109 mg/dL — ABNORMAL HIGH (ref 65–99)
POTASSIUM: 3.2 mmol/L — AB (ref 3.5–5.1)
SODIUM: 135 mmol/L (ref 135–145)
Total Bilirubin: 1.6 mg/dL — ABNORMAL HIGH (ref 0.3–1.2)
Total Protein: 8.3 g/dL — ABNORMAL HIGH (ref 6.5–8.1)

## 2017-04-25 LAB — APTT: aPTT: 31 seconds (ref 24–36)

## 2017-04-25 LAB — CBC
HCT: 38.2 % — ABNORMAL LOW (ref 39.0–52.0)
Hemoglobin: 13.3 g/dL (ref 13.0–17.0)
MCH: 31.4 pg (ref 26.0–34.0)
MCHC: 34.8 g/dL (ref 30.0–36.0)
MCV: 90.3 fL (ref 78.0–100.0)
PLATELETS: 237 10*3/uL (ref 150–400)
RBC: 4.23 MIL/uL (ref 4.22–5.81)
RDW: 14.4 % (ref 11.5–15.5)
WBC: 9.5 10*3/uL (ref 4.0–10.5)

## 2017-04-25 LAB — I-STAT CHEM 8, ED
BUN: 20 mg/dL (ref 6–20)
CALCIUM ION: 1.08 mmol/L — AB (ref 1.15–1.40)
CHLORIDE: 102 mmol/L (ref 101–111)
Creatinine, Ser: 1.8 mg/dL — ABNORMAL HIGH (ref 0.61–1.24)
Glucose, Bld: 111 mg/dL — ABNORMAL HIGH (ref 65–99)
HEMATOCRIT: 42 % (ref 39.0–52.0)
Hemoglobin: 14.3 g/dL (ref 13.0–17.0)
Potassium: 3.2 mmol/L — ABNORMAL LOW (ref 3.5–5.1)
SODIUM: 138 mmol/L (ref 135–145)
TCO2: 22 mmol/L (ref 22–32)

## 2017-04-25 LAB — PROTIME-INR
INR: 1.01
PROTHROMBIN TIME: 13.2 s (ref 11.4–15.2)

## 2017-04-25 LAB — ETHANOL

## 2017-04-25 LAB — I-STAT TROPONIN, ED: Troponin i, poc: 0.01 ng/mL (ref 0.00–0.08)

## 2017-04-25 LAB — CBG MONITORING, ED: Glucose-Capillary: 103 mg/dL — ABNORMAL HIGH (ref 65–99)

## 2017-04-25 MED ORDER — METOPROLOL TARTRATE 5 MG/5ML IV SOLN
5.0000 mg | Freq: Once | INTRAVENOUS | Status: AC
  Administered 2017-04-25: 5 mg via INTRAVENOUS
  Filled 2017-04-25: qty 5

## 2017-04-25 MED ORDER — HYDRALAZINE HCL 25 MG PO TABS: 25.0000 mg | ORAL_TABLET | Freq: Three times a day (TID) | ORAL | 0 refills | Status: DC

## 2017-04-25 MED ORDER — SODIUM CHLORIDE 0.9 % IV BOLUS (SEPSIS)
1000.0000 mL | Freq: Once | INTRAVENOUS | Status: AC
Start: 2017-04-25 — End: 2017-04-25
  Administered 2017-04-25: 1000 mL via INTRAVENOUS

## 2017-04-25 MED ORDER — METOPROLOL TARTRATE 50 MG PO TABS: 50.0000 mg | ORAL_TABLET | Freq: Every day | ORAL | 0 refills | Status: DC

## 2017-04-25 MED ORDER — HYDRALAZINE HCL 20 MG/ML IJ SOLN
10.0000 mg | Freq: Once | INTRAMUSCULAR | Status: AC
  Administered 2017-04-25: 10 mg via INTRAVENOUS
  Filled 2017-04-25: qty 1

## 2017-04-25 NOTE — ED Triage Notes (Signed)
Pt states he has been off his BP medication x 1 year comes to the ED today for reports of HTN, feeling off balance, shaky, dizzy, and fevers

## 2017-04-25 NOTE — ED Triage Notes (Signed)
Pt stating that is 2017 and that he is 65 but oriented to place, self, and situation.

## 2017-04-25 NOTE — ED Provider Notes (Signed)
[facility] EMERGENCY DEPARTMENT Provider Note   CSN: [id number] Arrival date & time: 04/25/17  1615     History   Chief Complaint Chief Complaint  Patient presents with  . Hypertension  . Dizziness    HPI Dan Henry is a 66 y.o. male.  The history is provided by the patient and a relative.  Illness  This is a chronic problem. Episode onset: Since Thanksgiving. Episode frequency: Intermittently. The problem has not changed since onset.Associated symptoms include abdominal pain. Pertinent negatives include no chest pain and no shortness of breath. Associated symptoms comments: Dizziness, sweating. Nothing aggravates the symptoms. Nothing relieves the symptoms. He has tried nothing for the symptoms.    Past Medical History:  Diagnosis Date  . Hypertension     Patient Active Problem List   Diagnosis Date Noted  . Essential hypertension 05/21/2015  . Healthcare maintenance 05/21/2015    History reviewed. No pertinent surgical history.     Home Medications    Prior to Admission medications   Medication Sig Start Date End Date Taking? Authorizing Provider  acetaminophen (TYLENOL) 500 MG tablet Take 1,000 mg by mouth every 6 (six) hours as needed for mild pain or headache.   Yes [provider]  ibuprofen (ADVIL,MOTRIN) 100 MG chewable tablet Chew 400 mg by mouth every 8 (eight) hours as needed for mild pain.   Yes [provider]  hydrALAZINE (APRESOLINE) 25 MG tablet Take 1 tablet (25 mg total) by mouth 3 (three) times daily. 04/25/17 05/25/17  Jenny Reichmann, MD  lisinopril-hydrochlorothiazide (ZESTORETIC) 10-12.5 MG tablet Take 1 tablet by mouth daily. Patient not taking: Reported on 04/25/2017 05/21/15   Lucious Groves, DO  metoprolol tartrate (LOPRESSOR) 50 MG tablet Take 1 tablet (50 mg total) by mouth daily. 04/25/17 05/25/17  Jenny Reichmann, MD    Family History Family History  Problem Relation Age of Onset  .  Hypertension Mother   . Hypertension Father   . Hypertension Sister   . Hypertension Sister   . Stroke Sister   . Cancer Brother        throat    Social History Social History   Tobacco Use  . Smoking status: Never Smoker  . Smokeless tobacco: Never Used  Substance Use Topics  . Alcohol use: Yes    Alcohol/week: 2.4 oz    Types: 4 Standard drinks or equivalent per week    Comment: occ  . Drug use: No     Allergies   Patient has no known allergies.   Review of Systems Review of Systems  Constitutional: Positive for diaphoresis. Negative for chills and fever.  HENT: Negative for rhinorrhea and sore throat.   Eyes: Negative for visual disturbance.  Respiratory: Negative for cough and shortness of breath.   Cardiovascular: Negative for chest pain and palpitations.  Gastrointestinal: Positive for abdominal pain. Negative for diarrhea, nausea and vomiting.  Genitourinary: Negative for dysuria.  Musculoskeletal: Negative for arthralgias, back pain and neck pain.  Skin: Negative for rash.  Neurological: Positive for dizziness and tremors. Negative for seizures and syncope.  Psychiatric/Behavioral: Negative for confusion.  All other systems reviewed and are negative.    Physical Exam Updated Vital Signs BP (!) 172/91   Pulse 99   Temp 98.2 F (36.8 C) (Oral)   Resp 17   Ht 5\' 5"  (1.651 m)   Wt 54.4 kg (120 lb)   SpO2 100%   BMI 19.97 kg/m   Physical Exam  Constitutional: He  is oriented to person, place, and time. He appears well-developed and well-nourished.  HENT:  Head: Normocephalic and atraumatic.  Eyes: Conjunctivae are normal.  Neck: Neck supple.  Cardiovascular: Regular rhythm.  No murmur heard. Tachycardic  Pulmonary/Chest: Effort normal and breath sounds normal. No respiratory distress.  Abdominal: Soft. There is no tenderness.  Musculoskeletal: Normal range of motion. He exhibits no edema.  Neurological: He is alert and oriented to person, place,  and time.  CN 2-12 grossly intact, 5/5 strength in all 4ext, no focal sensory deficits, resting tremor  Skin: Skin is warm and dry.  Psychiatric: He has a normal mood and affect.  Nursing note and vitals reviewed.    ED Treatments / Results  Labs (all labs ordered are listed, but only abnormal results are displayed) Labs Reviewed  CBC - Abnormal; Notable for the following components:      Result Value   HCT 38.2 (*)    All other components within normal limits  COMPREHENSIVE METABOLIC PANEL - Abnormal; Notable for the following components:   Potassium 3.2 (*)    Chloride 99 (*)    CO2 19 (*)    Glucose, Bld 109 (*)    Creatinine, Ser 1.85 (*)    Total Protein 8.3 (*)    AST 119 (*)    Total Bilirubin 1.6 (*)    GFR calc non Af Amer 36 (*)    GFR calc Af Amer 42 (*)    Anion gap 17 (*)    All other components within normal limits  CBG MONITORING, ED - Abnormal; Notable for the following components:   Glucose-Capillary 103 (*)    All other components within normal limits  I-STAT CHEM 8, ED - Abnormal; Notable for the following components:   Potassium 3.2 (*)    Creatinine, Ser 1.80 (*)    Glucose, Bld 111 (*)    Calcium, Ion 1.08 (*)    All other components within normal limits  PROTIME-INR  APTT  DIFFERENTIAL  ETHANOL  I-STAT TROPONIN, ED  CBG MONITORING, ED    EKG  EKG Interpretation None       Radiology Ct Head Wo Contrast  Result Date: 04/25/2017 CLINICAL DATA:  Pt states he has been off his BP medication x 1 year comes to the ED today for reports of HTN, feeling off balance, shaky, dizzy, and fevers EXAM: CT HEAD WITHOUT CONTRAST TECHNIQUE: Contiguous axial images were obtained from the base of the skull through the vertex without intravenous contrast. COMPARISON:  None. FINDINGS: Brain: No evidence of acute infarction, hemorrhage, hydrocephalus, extra-axial collection or mass lesion/mass effect. There is mild, age-appropriate, ventricular and sulcal  enlargement. Mild periventricular white matter hypoattenuation is noted consistent with chronic microvascular ischemic change. Vascular: No hyperdense vessel or unexpected calcification. Skull: Choose Sinuses/Orbits: Visualize globes and orbits are unremarkable. The visualized there is a small amount of dependent fluid in the left sphenoid sinus. Mild mucosal thickening noted in sphenoid, posterior right ethmoid and maxillary sinuses. Mastoid air cells are clear. Other: None. IMPRESSION: 1. No acute intracranial abnormalities. 2. Mild chronic microvascular ischemic change. 3. Mild sinus disease as detailed. Electronically Signed   By: Lajean Manes M.D.   On: 04/25/2017 16:58   Dg Chest Portable 1 View  Result Date: 04/25/2017 CLINICAL DATA:  Fever. EXAM: PORTABLE CHEST 1 VIEW COMPARISON:  None. FINDINGS: The heart size and mediastinal contours are within normal limits. Both lungs are clear. No pneumothorax or pleural effusion is noted. The visualized skeletal  structures are unremarkable. IMPRESSION: No acute cardiopulmonary abnormality seen. Electronically Signed   By: Marijo Conception, M.D.   On: 04/25/2017 18:56    Procedures Procedures (including critical care time)  Medications Ordered in ED Medications  metoprolol tartrate (LOPRESSOR) injection 5 mg (5 mg Intravenous Given 04/25/17 1824)  sodium chloride 0.9 % bolus 1,000 mL (0 mLs Intravenous Stopped 04/25/17 1855)  hydrALAZINE (APRESOLINE) injection 10 mg (10 mg Intravenous Given 04/25/17 1919)     Initial Impression / Assessment and Plan / ED Course  I have reviewed the triage vital signs and the nursing notes.  Pertinent labs & imaging results that were available during my care of the patient were reviewed by me and considered in my medical decision making (see chart for details).     Pt with h/o HTN presents with dizziness. Pt has been feeling intermittent episodes of dizziness, a sense of being off balance, sweating, and "stomach  pains" since Thanksgiving; he told his niece about them & intended on going to a physician, but never followed through. Today, his niece was with him for the holiday and witnessed an episode of the dizziness, so she brought him in for evaluation. Niece notes that there is a family history of HTN and stroke, so she was concerned about his symptoms. Pt was on Zestoretic (lisinopril-HCTZ combo) approximately 2yr ago, but stopped taking it and cannot explain why.  VS & exam as above. EKG: ST @ 115bpm w/LVH; wandering baseline makes it difficult to determine if there are STD in inferolateral leads & there are no priors for comparison. CXR WNL. Labs remarkable for Crt 1.80 (last prior was 0.97 in 2/17). IV Lopressor & NS bolus given in the ED.  On re-evaluation, Lopressor had minimal effect, so IV Hydralazine given with appropriate decrease in BP.  Unclear if increased creatinine represents AKI or chronic issue 2/2 to uncontrolled HTN. Will prescribe anti-hypertensives and have the Pt follow up with a PCP for medication management & repeat blood work.  Explained all results to the Pt. Will discharge the Pt home. Recommending follow-up with PCP. ED return precautions provided. Pt acknowledged understanding of, and concurrence with the plan. All questions answered to his satisfaction. In stable condition at the time of discharge.  Final Clinical Impressions(s) / ED Diagnoses   Final diagnoses:  Hypertensive urgency    ED Discharge Orders        Ordered    hydrALAZINE (APRESOLINE) 25 MG tablet  3 times daily     04/25/17 1953    metoprolol tartrate (LOPRESSOR) 50 MG tablet  Daily     04/25/17 1953       Jenny Reichmann, MD 04/25/17 2357    Drenda Freeze, MD 04/27/17 [phone]

## 2017-04-25 NOTE — ED Notes (Signed)
Patient ambulated in room without assistance. Pt endorses mild sensation of "off-balance". Upper and lower extremity tremors significantly worsened with ambulation but patient was able to ambulate back to bed.

## 2017-05-03 ENCOUNTER — Ambulatory Visit: Payer: Medicare Other

## 2017-05-08 ENCOUNTER — Encounter: Payer: Self-pay | Admitting: Internal Medicine

## 2017-05-08 ENCOUNTER — Ambulatory Visit (HOSPITAL_COMMUNITY)
Admission: RE | Admit: 2017-05-08 | Discharge: 2017-05-08 | Disposition: A | Discharge status: Home / Self Care | Payer: Medicare Other | Source: Ambulatory Visit | Attending: Internal Medicine | Admitting: Internal Medicine

## 2017-05-08 ENCOUNTER — Ambulatory Visit (INDEPENDENT_AMBULATORY_CARE_PROVIDER_SITE_OTHER): Payer: Medicare Other | Admitting: Internal Medicine

## 2017-05-08 VITALS — BP 219/95 | HR 70 | Temp 98.3°F | Wt 114.1 lb

## 2017-05-08 DIAGNOSIS — Z79899 Other long term (current) drug therapy: Secondary | ICD-10-CM | POA: Diagnosis not present

## 2017-05-08 DIAGNOSIS — F1090 Alcohol use, unspecified, uncomplicated: Secondary | ICD-10-CM

## 2017-05-08 DIAGNOSIS — R0989 Other specified symptoms and signs involving the circulatory and respiratory systems: Secondary | ICD-10-CM | POA: Diagnosis not present

## 2017-05-08 DIAGNOSIS — N179 Acute kidney failure, unspecified: Secondary | ICD-10-CM

## 2017-05-08 DIAGNOSIS — Z8249 Family history of ischemic heart disease and other diseases of the circulatory system: Secondary | ICD-10-CM | POA: Diagnosis not present

## 2017-05-08 DIAGNOSIS — R079 Chest pain, unspecified: Secondary | ICD-10-CM | POA: Diagnosis not present

## 2017-05-08 DIAGNOSIS — Z823 Family history of stroke: Secondary | ICD-10-CM | POA: Diagnosis not present

## 2017-05-08 DIAGNOSIS — Z950 Presence of cardiac pacemaker: Secondary | ICD-10-CM

## 2017-05-08 DIAGNOSIS — I10 Essential (primary) hypertension: Secondary | ICD-10-CM

## 2017-05-08 DIAGNOSIS — Z0000 Encounter for general adult medical examination without abnormal findings: Secondary | ICD-10-CM

## 2017-05-08 DIAGNOSIS — Z7289 Other problems related to lifestyle: Secondary | ICD-10-CM

## 2017-05-08 DIAGNOSIS — R9431 Abnormal electrocardiogram [ECG] [EKG]: Secondary | ICD-10-CM | POA: Diagnosis not present

## 2017-05-08 HISTORY — DX: Acute kidney failure, unspecified: N17.9

## 2017-05-08 MED ORDER — METOPROLOL TARTRATE 50 MG PO TABS: 50.0000 mg | ORAL_TABLET | Freq: Two times a day (BID) | ORAL | 2 refills | Status: DC

## 2017-05-08 MED ORDER — CHLORTHALIDONE 25 MG PO TABS: 25.0000 mg | ORAL_TABLET | Freq: Every day | ORAL | 2 refills | Status: DC

## 2017-05-08 MED ORDER — AMLODIPINE BESYLATE 10 MG PO TABS: 10.0000 mg | ORAL_TABLET | Freq: Every day | ORAL | 11 refills | Status: DC

## 2017-05-08 NOTE — Assessment & Plan Note (Signed)
Appreciated on exam today.  Plan: --will need carotid dopplers in future; getting lipids today.

## 2017-05-08 NOTE — Patient Instructions (Addendum)
For your blood pressure: Stop hydralazine (three times a day) Take metoprolol 50mg  twice daily Start amlodipine 10mg  daily Start chlorthalidone 25mg  daily  We will do some labs today and call you with results if we need to change anything. Please make an appointment to be seen in 1 week to recheck on your blood pressure and to see how the change in medication is working.

## 2017-05-08 NOTE — Assessment & Plan Note (Addendum)
Patient continues to be hypertensive to 214/89 on repeat. He is asymptomatic currently.   Plan: --stop hydral and start amlodipine 10mg  daily --switch metoprolol to 50mg  BID --start chlorthalidone 25mg  daily --repeat Bmet --f/u lipid panel - will likely need asa and statin for primary prevention but will calculate his ASCVD risk score. --will need Echo going forward to evaluate LVH --f/u in 1 wk  Addendum: ASCVD risk score 32%; at f/u please discuss starting high intensity statin and ASA 81mg  daily for primary prevention. We discussed this possibility and patient wanted to wait until f/u for any additional medication changes.

## 2017-05-08 NOTE — Progress Notes (Signed)
   CC: Hypertension  HPI:  [name] is a 67 y.o. with a PMH of HTN presenting to clinic for ED follow up on hypertensive urgency.  Patient seen in ED 04/25/17 for dizziness, tremor at rest and hypertension; BP in ED was 172/91. EKG at the time showed tachycardia and LVH; Cmet was remarkable for Cr 1.85 (previous 0.9 one year prior), hypoK to 3.2, and bicarb 19. He was discharged on metoprolol tartrate 50mg  daily and hydral 25mg  TID. Patient was previously on HCTZ-lisinopril but stopped about 9 months ago for no particular reason per patient.  Today patient states he has been compliant with medications since ED visit; he reports that prior to starting medications he used to experience frequent left sided headaches which have now subsided. He also reports that prior to starting medications he experienced tremors, unsteady gait which have also resolved. He denies vision changes, dysarthria, dysphagia, focal weakness or numbness, falls. He does endorse some dizziness for about 20 mins after taking BP meds which resolves on its own; he denies falling due to this. He endorses one episode of chest pain last week that was very focal to his LUSB, dull in quality, occurred while he was walking and resolved at rest. He denies further recurrence of this chest pain even with exertion. He denies shortness of breath, cough, LE edema, fevers, chills.  Patient denies prior surgeries. He endorses a family history of HTN and stroke in his younger sister at age 68; he denies MIs or kidney problems in his family. He denies prior tobacco or illicit drug use. He endorses prior to Christmas drinking about 2 shots of liquor per week in addition to beer; he now endorses drinking about 4 beers per week. He lives alone but has a sister and niece in town.  Please see problem based Assessment and Plan for status of patients chronic conditions.  Past Medical History:  Diagnosis Date  . Hypertension     Review of  Systems:   ROS Per HPI  Physical Exam:  Vitals:   05/08/17 0923 05/08/17 0930  BP: (!) 248/104 (!) 219/95  Pulse: (!) 273 70  Temp: 98.3 F (36.8 C)   TempSrc: Oral   SpO2: 100%   Weight: 114 lb 1.6 oz (51.8 kg)    GENERAL- alert, co-operative, appears as stated age, not in any distress. HEENT- Atraumatic, normocephalic, PERRL, EOMI, oral mucosa appears moist, R sided carotid bruit CARDIAC- RRR, no murmurs, rubs or gallops. Laterally displaced PMI RESP- Moving equal volumes of air, and clear to auscultation bilaterally, no wheezes or crackles. ABDOMEN- Soft, nontender, nondistended NEURO- CN 2-12 intact, strength and sensation intact throughout. EXTREMITIES- pulse 2+, symmetric, no pedal edema. SKIN- Warm, dry, no rash or lesion. PSYCH- Normal mood and affect, appropriate thought content and speech.  EKG personally reviewed 05/08/17: Sinus rhythm, LVH, no q waves, no ischemic ST elevation or TWI  Assessment & Plan:   See Encounters Tab for problem based charting.   Patient discussed with Dr. Gerrit Friends, MD Internal Medicine PGY2

## 2017-05-08 NOTE — Assessment & Plan Note (Signed)
Getting lipid panel and HepC screen today.  Will need colonoscopy and PNA vaccination in future.

## 2017-05-08 NOTE — Assessment & Plan Note (Signed)
During ED visit, Cr 1.85 compared to 0.9 a year prior. Etiology CKD vs AKI in setting of uncontrolled HTN. Patient denies urinary symptoms.  Plan: --F/u Bmet

## 2017-05-09 LAB — LIPID PANEL
CHOL/HDL RATIO: 1.7 ratio (ref 0.0–5.0)
Cholesterol, Total: 173 mg/dL (ref 100–199)
HDL: 104 mg/dL (ref >39)
LDL CALC: 59 mg/dL (ref 0–99)
Triglycerides: 50 mg/dL (ref 0–149)
VLDL Cholesterol Cal: 10 mg/dL (ref 5–40)

## 2017-05-09 LAB — HEPATIC FUNCTION PANEL
ALBUMIN: 4.7 g/dL (ref 3.6–4.8)
ALT: 70 IU/L — AB (ref 0–44)
AST: 73 IU/L — ABNORMAL HIGH (ref 0–40)
Alkaline Phosphatase: 91 IU/L (ref 39–117)
BILIRUBIN TOTAL: 0.4 mg/dL (ref 0.0–1.2)
BILIRUBIN, DIRECT: 0.16 mg/dL (ref 0.00–0.40)
Total Protein: 7.6 g/dL (ref 6.0–8.5)

## 2017-05-09 LAB — BMP8+ANION GAP
ANION GAP: 19 mmol/L — AB (ref 10.0–18.0)
BUN/Creatinine Ratio: 14 (ref 10–24)
BUN: 14 mg/dL (ref 8–27)
CALCIUM: 9.4 mg/dL (ref 8.6–10.2)
CHLORIDE: 100 mmol/L (ref 96–106)
CO2: 22 mmol/L (ref 20–29)
Creatinine, Ser: 1.02 mg/dL (ref 0.76–1.27)
GFR calc non Af Amer: 76 mL/min/{1.73_m2} (ref >59)
GFR, EST AFRICAN AMERICAN: 88 mL/min/{1.73_m2} (ref >59)
GLUCOSE: 95 mg/dL (ref 65–99)
Potassium: 4.2 mmol/L (ref 3.5–5.2)
Sodium: 141 mmol/L (ref 134–144)

## 2017-05-09 LAB — HEPATITIS C ANTIBODY

## 2017-05-09 NOTE — Progress Notes (Signed)
Internal Medicine Clinic Attending  Case discussed with Dr. Svalina  at the time of the visit.  We reviewed the resident's history and exam and pertinent patient test results.  I agree with the assessment, diagnosis, and plan of care documented in the resident's note.  

## 2017-05-16 ENCOUNTER — Other Ambulatory Visit: Payer: Self-pay

## 2017-05-16 ENCOUNTER — Ambulatory Visit (INDEPENDENT_AMBULATORY_CARE_PROVIDER_SITE_OTHER): Payer: Medicare Other | Admitting: Internal Medicine

## 2017-05-16 ENCOUNTER — Encounter: Payer: Self-pay | Admitting: Internal Medicine

## 2017-05-16 VITALS — BP 186/79 | HR 69 | Temp 97.7°F | Ht 64.0 in | Wt 115.7 lb

## 2017-05-16 DIAGNOSIS — I10 Essential (primary) hypertension: Secondary | ICD-10-CM

## 2017-05-16 DIAGNOSIS — R7989 Other specified abnormal findings of blood chemistry: Secondary | ICD-10-CM | POA: Diagnosis not present

## 2017-05-16 DIAGNOSIS — I2510 Atherosclerotic heart disease of native coronary artery without angina pectoris: Secondary | ICD-10-CM | POA: Diagnosis not present

## 2017-05-16 DIAGNOSIS — Z79899 Other long term (current) drug therapy: Secondary | ICD-10-CM | POA: Diagnosis not present

## 2017-05-16 DIAGNOSIS — R945 Abnormal results of liver function studies: Secondary | ICD-10-CM

## 2017-05-16 MED ORDER — CHLORTHALIDONE 50 MG PO TABS: 50.0000 mg | ORAL_TABLET | Freq: Every day | ORAL | 1 refills | Status: DC

## 2017-05-16 MED ORDER — ATORVASTATIN CALCIUM 40 MG PO TABS: 40.0000 mg | ORAL_TABLET | Freq: Every day | ORAL | 2 refills | Status: DC

## 2017-05-16 NOTE — Progress Notes (Signed)
   CC: Follow-up on essential hypertension  HPI:  [name] is a 66 y.o. male with history noted below that presents to the acute care clinic for follow-up on essential hypertension. Please see problem based charting for the status of patient's chronic medical conditions.  Past Medical History:  Diagnosis Date  . Hypertension     Review of Systems:  Review of Systems  Respiratory: Negative for shortness of breath.   Cardiovascular: Negative for chest pain and leg swelling.  Neurological: Negative for dizziness.     Physical Exam:  Vitals:   05/16/17 0920  BP: (!) 186/79  Pulse: 69  Temp: 97.7 F (36.5 C)  TempSrc: Oral  SpO2: 100%  Weight: 115 lb 11.2 oz (52.5 kg)  Height: 5\' 4"  (1.626 m)   Physical Exam  Constitutional: He is well-developed, well-nourished, and in no distress.  Cardiovascular: Normal rate, regular rhythm and normal heart sounds. Exam reveals no gallop and no friction rub.  No murmur heard. Pulmonary/Chest: Effort normal and breath sounds normal. No respiratory distress. He has no wheezes. He has no rales.    Assessment & Plan:   See encounters tab for problem based medical decision making.    Patient discussed with Dr. Angelia Mould

## 2017-05-16 NOTE — Assessment & Plan Note (Signed)
Assessment:  ASCVD risk of 32% Patient had a lipid panel on 05/08/2017 and ASCVD risk score was 32%. Will start  atorvastatin 40 mg daily for primary prevention.  Plan -atorvastatin 40mg 

## 2017-05-16 NOTE — Assessment & Plan Note (Addendum)
Assessment:  Elevated LFTs Patient has elevated AST and ALT. Hep C is negative.  Patient states he is an occasional alcohol drinker. Will obtain hep B and hep A labs. and order a right upper quadrant ultrasound.  Plan -Hep B and Hep A -RUQ ultarsound   Addendum:  Hep B antibody negative.  Will need to discuss immunization at next visit on 06/2017 RUQ US showed robable hemangioma in the right hepatic lobe. Follow-up ultrasound in 6 months is recommended.

## 2017-05-16 NOTE — Assessment & Plan Note (Addendum)
Assessment:  Essential hypertension Patient was seen in the clinic 05/08/2017 with blood pressure of 214/89. At that time hydralazine was stopped and amlodipine 10 mg started.  Patient was increased to metoprolol 50 mg twice a day and started on chlorthalidone 25 mg daily. Today's blood pressure is elevated at 186/79 however improved from previous blood pressure reading.  Will increase chlorthalidone to 50mg  daily.  Plan -BMET - Increase chlorthalidone to 50mg  daily  - follow up with primary care physician in 1.22months

## 2017-05-16 NOTE — Patient Instructions (Signed)
Dan Henry,  Please follow up with Dr. Benjamine Mola in 1.88months. Please increase your chlorthalidone to two pills a day until you run out.  Your new prescription will be one pill a day (50mg  daily)

## 2017-05-16 NOTE — Progress Notes (Signed)
Internal Medicine Clinic Attending  Case discussed with Dr. Anshul Meddings at the time of the visit.  We reviewed the resident's history and exam and pertinent patient test results.  I agree with the assessment, diagnosis, and plan of care documented in the resident's note.  

## 2017-05-17 LAB — BMP8+ANION GAP
Anion Gap: 16 mmol/L (ref 10.0–18.0)
BUN / CREAT RATIO: 18 (ref 10–24)
BUN: 20 mg/dL (ref 8–27)
CHLORIDE: 98 mmol/L (ref 96–106)
CO2: 26 mmol/L (ref 20–29)
Calcium: 9.9 mg/dL (ref 8.6–10.2)
Creatinine, Ser: 1.09 mg/dL (ref 0.76–1.27)
GFR, EST AFRICAN AMERICAN: 81 mL/min/{1.73_m2} (ref >59)
GFR, EST NON AFRICAN AMERICAN: 70 mL/min/{1.73_m2} (ref >59)
GLUCOSE: 113 mg/dL — AB (ref 65–99)
POTASSIUM: 5 mmol/L (ref 3.5–5.2)
SODIUM: 140 mmol/L (ref 134–144)

## 2017-05-17 LAB — HEPATITIS B SURFACE ANTIBODY,QUALITATIVE: Hep B Surface Ab, Qual: NONREACTIVE

## 2017-05-17 LAB — HEPATITIS B CORE ANTIBODY, TOTAL: Hep B Core Total Ab: NEGATIVE

## 2017-05-17 LAB — HEPATITIS A ANTIBODY, TOTAL: Hep A Total Ab: POSITIVE — AB

## 2017-05-17 LAB — HEPATITIS B SURFACE ANTIGEN: Hepatitis B Surface Ag: NEGATIVE

## 2017-05-22 ENCOUNTER — Ambulatory Visit (INDEPENDENT_AMBULATORY_CARE_PROVIDER_SITE_OTHER): Payer: Medicare Other | Admitting: Neurology

## 2017-05-22 ENCOUNTER — Telehealth: Payer: Self-pay | Admitting: Neurology

## 2017-05-22 ENCOUNTER — Encounter: Payer: Self-pay | Admitting: Neurology

## 2017-05-22 VITALS — BP 141/70 | HR 68 | Ht 64.0 in | Wt 114.0 lb

## 2017-05-22 DIAGNOSIS — R251 Tremor, unspecified: Secondary | ICD-10-CM

## 2017-05-22 DIAGNOSIS — R5382 Chronic fatigue, unspecified: Secondary | ICD-10-CM

## 2017-05-22 DIAGNOSIS — R0989 Other specified symptoms and signs involving the circulatory and respiratory systems: Secondary | ICD-10-CM

## 2017-05-22 DIAGNOSIS — I2510 Atherosclerotic heart disease of native coronary artery without angina pectoris: Secondary | ICD-10-CM | POA: Diagnosis not present

## 2017-05-22 NOTE — Progress Notes (Signed)
Reason for visit: Tremor  Referring physician:   [name] is a 67 y.o. male  History of present illness:  Dan Henry is a 67 year old right-handed black male with a history of a tremor that he believes started around Thanksgiving of 2018.  The patient was in the emergency room on 25 April 2017 with reports of dizziness, tremor, and his blood pressure was quite high.  The patient since has been placed on metoprolol, he has noted that his blood pressures are much better controlled and his dizziness and tremors have essentially gone away.  The patient had noted that the tremors were present with using the arms, not with rest.  The tremors were affecting both arms and both legs.  He had some change in handwriting and with holding objects, he would notice a tremor.  The patient denies any head tremor or jaw tremor, he denies any vocal tremor.  He denies any family history of tremor.  Overall, he is feeling much better now.  He comes to this office for an evaluation.  Past Medical History:  Diagnosis Date  . Hypertension     Past Surgical History:  Procedure Laterality Date  . NO PAST SURGERIES      Family History  Problem Relation Age of Onset  . Hypertension Mother   . Hypertension Father   . Hypertension Sister   . Hypertension Sister   . Stroke Sister   . Cancer Brother        throat    Social history:  reports that  has never smoked. he has never used smokeless tobacco. He reports that he drinks about 2.4 oz of alcohol per week. He reports that he does not use drugs.  Medications:  Prior to Admission medications   Medication Sig Start Date End Date Taking? Authorizing Provider  acetaminophen (TYLENOL) 500 MG tablet Take 1,000 mg by mouth every 6 (six) hours as needed for mild pain or headache.   Yes [provider]  amLODipine (NORVASC) 10 MG tablet Take 1 tablet (10 mg total) by mouth daily. 05/08/17 05/08/18 Yes Alphonzo Grieve, MD  atorvastatin  (LIPITOR) 40 MG tablet Take 1 tablet (40 mg total) by mouth daily. 05/16/17  Yes Hoffman, Jessica Ratliff, DO  chlorthalidone (HYGROTON) 50 MG tablet Take 1 tablet (50 mg total) by mouth daily. 05/16/17  Yes Hoffman, Jessica Ratliff, DO  metoprolol tartrate (LOPRESSOR) 50 MG tablet Take 1 tablet (50 mg total) by mouth 2 (two) times daily. 05/08/17 06/07/17 Yes Alphonzo Grieve, MD     No Known Allergies  ROS:  Out of a complete 14 system review of symptoms, the patient complains only of the following symptoms, and all other reviewed systems are negative.  Weight loss Chest pain Feeling hot, cold Muscle cramps Headache, tremor  Blood pressure (!) 141/70, pulse 68, height 5\' 4"  (1.626 m), weight 114 lb (51.7 kg), SpO2 98 %.  Physical Exam  General: The patient is alert and cooperative at the time of the examination.  Eyes: Pupils are equal, round, and reactive to light. Discs are flat bilaterally.  Neck: The neck is supple, bilateral carotid bruits are noted, right greater than left.  Respiratory: The respiratory examination is clear.  Cardiovascular: The cardiovascular examination reveals a regular rate and rhythm, no obvious murmurs or rubs are noted.  Skin: Extremities are without significant edema.  Neurologic Exam  Mental status: The patient is alert and oriented x 3 at the time of the examination. The patient  has apparent normal recent and remote memory, with an apparently normal attention span and concentration ability.  Cranial nerves: Facial symmetry is present. There is good sensation of the face to pinprick and soft touch bilaterally. The strength of the facial muscles and the muscles to head turning and shoulder shrug are normal bilaterally. Speech is well enunciated, no aphasia or dysarthria is noted. Extraocular movements are full. Visual fields are full. The tongue is midline, and the patient has symmetric elevation of the soft palate. No obvious hearing deficits are  noted.  Motor: The motor testing reveals 5 over 5 strength of all 4 extremities. Good symmetric motor tone is noted throughout.  Sensory: Sensory testing is intact to pinprick, soft touch, vibration sensation, and position sense on all 4 extremities. No evidence of extinction is noted.  Coordination: Cerebellar testing reveals good finger-nose-finger and heel-to-shin bilaterally.  No significant tremor was seen.  The patient is able to draw a spiral without translation of tremor into handwriting.  Gait and station: Gait is normal. Tandem gait is normal. Romberg is negative. No drift is seen.  Reflexes: Deep tendon reflexes are symmetric and normal bilaterally. Toes are downgoing bilaterally.   CT brain 04/25/17:  IMPRESSION: 1. No acute intracranial abnormalities. 2. Mild chronic microvascular ischemic change. 3. Mild sinus disease as detailed.  * CT scan images were reviewed online. I agree with the written report.    Assessment/Plan:  1.  Tremor, essential tremor or physiologic tremor  2.  Bilateral carotid bruits  The patient has had good improvement with the use of metoprolol with the tremor.  He no longer reports any dizziness.  His blood pressures have been under much better control.  He has undergone a CT scan of the brain that was relatively unremarkable.  The patient will be sent for blood work to check a thyroid panel, he will follow-up through this office if needed.  He will contact me if the tremor returns.  We will consider carotid Doppler study to evaluate the bilateral carotid bruits.   Jill Alexanders MD 05/22/2017 9:30 AM  [facility] [address] [place], [place] [address]  Phone [phone] Fax [phone]

## 2017-05-22 NOTE — Telephone Encounter (Signed)
I called the patient.  The clinical examination today revealed bilateral carotid bruits, right greater than left.  I will get a carotid Doppler study to evaluate this.

## 2017-05-23 ENCOUNTER — Telehealth: Payer: Self-pay | Admitting: *Deleted

## 2017-05-23 LAB — TSH: TSH: 0.876 u[IU]/mL (ref 0.450–4.500)

## 2017-05-23 NOTE — Telephone Encounter (Signed)
Called and spoke with patient about unremarkable labs per CW,MD note. Pt verbalized understanding.   Patient had questions about carotid doppler that was ordered yesterday by CW,MD.  I explained again that Dr. Jannifer Franklin said "The clinical examination today revealed bilateral carotid bruits, right greater than left" I explained this is a murmur sound heard from his exam. He wants to make sure there are no blockages, arteries are patent. He should hear within the next week to get scheduled. If he does not, advised him to call me. He verbalized understanding and appreciation for call.

## 2017-05-23 NOTE — Telephone Encounter (Signed)
-----   Message from Kathrynn Ducking, MD sent at 05/23/2017  7:18 AM EST -----  The blood work results are unremarkable. Please call the patient.  ----- Message ----- From: Lavone Neri Lab Results In Sent: 05/23/2017   5:41 AM To: Kathrynn Ducking, MD

## 2017-05-24 ENCOUNTER — Ambulatory Visit (HOSPITAL_COMMUNITY)
Admission: RE | Admit: 2017-05-24 | Discharge: 2017-05-24 | Disposition: A | Discharge status: Home / Self Care | Payer: Medicare Other | Source: Ambulatory Visit | Attending: Internal Medicine | Admitting: Internal Medicine

## 2017-05-24 DIAGNOSIS — R7989 Other specified abnormal findings of blood chemistry: Secondary | ICD-10-CM

## 2017-05-24 DIAGNOSIS — R945 Abnormal results of liver function studies: Secondary | ICD-10-CM | POA: Diagnosis not present

## 2017-05-25 ENCOUNTER — Other Ambulatory Visit: Payer: Self-pay | Admitting: *Deleted

## 2017-05-25 DIAGNOSIS — R0989 Other specified symptoms and signs involving the circulatory and respiratory systems: Secondary | ICD-10-CM

## 2017-05-25 NOTE — Telephone Encounter (Signed)
Called and spoke with patient again. Relayed again what the carotid doppler test was for and what we are trying to r/o as previously discussed. Again advised that he should be hearing within the next week to be scheduled. I will check with our scheduler to make sure she has the order. He verbalized understanding and appreciation for call.

## 2017-05-25 NOTE — Telephone Encounter (Signed)
Pt has called back asking to speak with RN [name] concerning what was discussed on 01-22 re: blockages in his neck.  Please call

## 2017-06-20 ENCOUNTER — Telehealth: Payer: Self-pay | Admitting: Neurology

## 2017-06-20 ENCOUNTER — Ambulatory Visit (HOSPITAL_COMMUNITY)
Admission: RE | Admit: 2017-06-20 | Discharge: 2017-06-20 | Disposition: A | Discharge status: Home / Self Care | Payer: Medicare Other | Source: Ambulatory Visit | Attending: Neurology | Admitting: Neurology

## 2017-06-20 DIAGNOSIS — R0989 Other specified symptoms and signs involving the circulatory and respiratory systems: Secondary | ICD-10-CM | POA: Insufficient documentation

## 2017-06-20 NOTE — Progress Notes (Signed)
Carotid duplex prelim: 1-39% ICA stenosis.  Kenidy Crossland Eunice, RDMS, RVT   

## 2017-06-20 NOTE — Telephone Encounter (Signed)
  I called the patient.  The carotid Doppler study is unremarkable.  No source of carotid bruits noted.  Carotid doppler 06/20/17:  Final Interpretation: Right Carotid: Velocities in the right ICA are consistent with a 1-39% stenosis.  Left Carotid: Velocities in the left ICA are consistent with a 1-39% stenosis.  Vertebrals: Both vertebral arteries were patent with antegrade flow.

## 2017-06-30 ENCOUNTER — Other Ambulatory Visit: Payer: Self-pay

## 2017-06-30 ENCOUNTER — Encounter: Payer: Self-pay | Admitting: Internal Medicine

## 2017-06-30 ENCOUNTER — Ambulatory Visit (INDEPENDENT_AMBULATORY_CARE_PROVIDER_SITE_OTHER): Payer: Medicare Other | Admitting: Internal Medicine

## 2017-06-30 VITALS — BP 135/75 | HR 80 | Temp 97.9°F | Ht 64.0 in | Wt 117.6 lb

## 2017-06-30 DIAGNOSIS — Z79899 Other long term (current) drug therapy: Secondary | ICD-10-CM | POA: Diagnosis not present

## 2017-06-30 DIAGNOSIS — R634 Abnormal weight loss: Secondary | ICD-10-CM

## 2017-06-30 DIAGNOSIS — E876 Hypokalemia: Secondary | ICD-10-CM

## 2017-06-30 DIAGNOSIS — I10 Essential (primary) hypertension: Secondary | ICD-10-CM

## 2017-06-30 DIAGNOSIS — B159 Hepatitis A without hepatic coma: Secondary | ICD-10-CM

## 2017-06-30 DIAGNOSIS — Z0000 Encounter for general adult medical examination without abnormal findings: Secondary | ICD-10-CM

## 2017-06-30 DIAGNOSIS — Z6820 Body mass index (BMI) 20.0-20.9, adult: Secondary | ICD-10-CM | POA: Diagnosis not present

## 2017-06-30 DIAGNOSIS — R945 Abnormal results of liver function studies: Secondary | ICD-10-CM

## 2017-06-30 DIAGNOSIS — R251 Tremor, unspecified: Secondary | ICD-10-CM | POA: Diagnosis not present

## 2017-06-30 DIAGNOSIS — Z972 Presence of dental prosthetic device (complete) (partial): Secondary | ICD-10-CM | POA: Diagnosis not present

## 2017-06-30 DIAGNOSIS — K029 Dental caries, unspecified: Secondary | ICD-10-CM | POA: Diagnosis not present

## 2017-06-30 DIAGNOSIS — R7989 Other specified abnormal findings of blood chemistry: Secondary | ICD-10-CM

## 2017-06-30 MED ORDER — METOPROLOL TARTRATE 50 MG PO TABS: 50.0000 mg | ORAL_TABLET | Freq: Two times a day (BID) | ORAL | 5 refills | Status: DC

## 2017-06-30 MED ORDER — CHLORTHALIDONE 50 MG PO TABS: 50.0000 mg | ORAL_TABLET | Freq: Every day | ORAL | 5 refills | Status: DC

## 2017-06-30 NOTE — Assessment & Plan Note (Addendum)
A: Dan Henry is now very much improved with BP of 135/75 down from 186/79 at his visit on 1/15 before increasing clorthalidone to 50mg  daily. Late last year he was evaluated at the ED and then in clinic with SBPs over 210. He has not had any more headaches since his previous visit.  P: Continue amlodipine 10mg  daily and chlorthalidone 50mg  daily. Repeat CMP at this time RTC in 6 months to also discuss F/U liver ultrasound is fine  Bmet checked in clinic shows mild hypokalemia at 3.2 and a serum creatining up to 1.31 from 1.09 which does not meet criteria for AKI. I will recommend Dan Henry to decrease his chlorthalidone dose back for 25mg  daily and follow up in about 2 months. If BP is uncontrolled again on this lower dose we will try a 3rd agent such as ARB rather than high dose thiazides.

## 2017-06-30 NOTE — Patient Instructions (Signed)
It was a pleasure to see you today Dan Henry.  You are doing a fantastic job with treating your high blood pressure. Keep taking your medications and staying active, this is the best way to reduce your risk of a heart attack or stroke in the future.  We are checking your blood work to make sure you are doing well with the current medicine doses. If this looks good we can decrease testing to much less often.  Please obtain a stool sample at home and deliver the kit to [place] so we can make sure you are up to date in screening for hidden intestinal disease or cancers.  We will recommend seeing you again this summer. Your previous abdominal ultrasound noted some irregularity in the liver with recommended 6 month repeat ultrasound to be sure it is nothing needing more testing.

## 2017-06-30 NOTE — Assessment & Plan Note (Signed)
A: Mildly elevated LFTs without obvious associated symptoms. At this time significance is unclear. Hep A Ab was found to be positive. He is not Hep B immune but also not at high risk. RUQ [place] identified probable hemangioma recommending 6 mo follow up.  P: LFTs on metabolic panel today Follow up approximately July with repeat US for low risk liver lesion on [place] Future Hepatitis B immunization series is recommended although not urgent

## 2017-06-30 NOTE — Progress Notes (Signed)
CC: Follow up for hypertension  HPI:  DanOmarion Henry is a 67 y.o. male with PMHx detailed below presenting at a 6 week follow up after increasing his medication doses for uncontrolled hypertension. In th past there may have been an association between his severe hypertension and headaches. He has no new acute complaints over the interval. He was also seen by Dr. Jannifer Franklin with neurology for his essential tremor and has seen a large improvement in this after starting metoprolol.  See problem based assessment and plan below for additional details.  Essential hypertension A: Mr. Cumming is now very much improved with BP of 135/75 down from 186/79 at his visit on 1/15 before increasing clorthalidone to 50mg  daily. Late last year he was evaluated at the ED and then in clinic with SBPs over 210. He has not had any more headaches since his previous visit.  P: Continue amlodipine 10mg  daily and chlorthalidone 50mg  daily. Repeat CMP at this time RTC in 6 months to also discuss F/U liver ultrasound is fine  Bmet checked in clinic shows mild hypokalemia at 3.2 and a serum creatining up to 1.31 from 1.09 which does not meet criteria for AKI. I will recommend Mr. Ceja to decrease his chlorthalidone dose back for 25mg  daily and follow up in about 2 months. If BP is uncontrolled again on this lower dose we will try a 3rd agent such as ARB rather than high dose thiazides.  Elevated LFTs A: Mildly elevated LFTs without obvious associated symptoms. At this time significance is unclear. Hep A Ab was found to be positive. He is not Hep B immune but also not at high risk. RUQ [place] identified probable hemangioma recommending 6 mo follow up.  P: LFTs on metabolic panel today Follow up approximately July with repeat US for low risk liver lesion on [place] Future Hepatitis B immunization series is recommended although not urgent  Healthcare maintenance Mr. Konigsberg is interested in getting caught up with age  appropriate healthcare screening. He is agreeable to fecal immunochemical testing for occult blood and this was provided in clinic today.   Past Medical History:  Diagnosis Date  . Hypertension     Review of Systems: Review of Systems  Constitutional: Positive for weight loss. Negative for chills, fever and malaise/fatigue.  HENT: Negative for hearing loss.   Respiratory: Negative for cough and shortness of breath.   Cardiovascular: Negative for leg swelling.  Gastrointestinal: Negative for diarrhea and nausea.  Musculoskeletal: Negative for falls.  Skin: Negative for rash.  Neurological: Positive for tremors. Negative for dizziness.  Psychiatric/Behavioral: The patient does not have insomnia.      Physical Exam: Vitals:   06/30/17 1412  BP: 135/75  Pulse: 80  Temp: 97.9 F (36.6 C)  TempSrc: Oral  SpO2: 100%  Weight: 117 lb 9.6 oz (53.3 kg)  Height: 5\' 4"  (1.626 m)   GENERAL- alert, co-operative, NAD HEENT- Dental implants, caries and gingival recession on remaining native teeth, difficulty opening mouth fully without pain, TMJ dysfunction, or obvious trismus CARDIAC- RRR, no murmurs, rubs or gallops. RESP- CTAB, no wheezes or crackles. ABDOMEN- Soft, nontender, no guarding or rebound NEURO- No tremors, strength upper and lower extremities- 5/5, Sensation intact globally EXTREMITIES- pulse 2+, symmetric, no pedal edema. SKIN- Warm, dry, No rash or lesion. PSYCH- Normal mood and affect, appropriate thought content and speech.   Assessment & Plan:   See encounters tab for problem based medical decision making.   Patient discussed with Dr. Rebeca Alert

## 2017-07-01 LAB — CMP14 + ANION GAP
A/G RATIO: 1.5 (ref 1.2–2.2)
ALK PHOS: 73 IU/L (ref 39–117)
ALT: 29 IU/L (ref 0–44)
ANION GAP: 17 mmol/L (ref 10.0–18.0)
AST: 52 IU/L — ABNORMAL HIGH (ref 0–40)
Albumin: 4.8 g/dL (ref 3.6–4.8)
BUN/Creatinine Ratio: 16 (ref 10–24)
BUN: 21 mg/dL (ref 8–27)
Bilirubin Total: 0.7 mg/dL (ref 0.0–1.2)
CO2: 20 mmol/L (ref 20–29)
CREATININE: 1.31 mg/dL — AB (ref 0.76–1.27)
Calcium: 9 mg/dL (ref 8.6–10.2)
Chloride: 101 mmol/L (ref 96–106)
GFR calc Af Amer: 65 mL/min/{1.73_m2} (ref >59)
GFR calc non Af Amer: 56 mL/min/{1.73_m2} — ABNORMAL LOW (ref >59)
Globulin, Total: 3.1 g/dL (ref 1.5–4.5)
Glucose: 92 mg/dL (ref 65–99)
Potassium: 3.2 mmol/L — ABNORMAL LOW (ref 3.5–5.2)
Sodium: 138 mmol/L (ref 134–144)
Total Protein: 7.9 g/dL (ref 6.0–8.5)

## 2017-07-03 MED ORDER — CHLORTHALIDONE 25 MG PO TABS: 25.0000 mg | ORAL_TABLET | Freq: Every day | ORAL | 2 refills | Status: DC

## 2017-07-03 NOTE — Assessment & Plan Note (Signed)
Dan Henry is interested in getting caught up with age appropriate healthcare screening. He is agreeable to fecal immunochemical testing for occult blood and this was provided in clinic today.

## 2017-07-11 NOTE — Progress Notes (Signed)
Internal Medicine Clinic Attending  Case discussed with Dr. Rice  at the time of the visit.  We reviewed the resident's history and exam and pertinent patient test results.  I agree with the assessment, diagnosis, and plan of care documented in the resident's note.  Alexander N Raines, MD   

## 2017-07-19 ENCOUNTER — Other Ambulatory Visit: Payer: Self-pay

## 2017-07-19 ENCOUNTER — Ambulatory Visit (INDEPENDENT_AMBULATORY_CARE_PROVIDER_SITE_OTHER): Payer: Medicare Other | Admitting: Internal Medicine

## 2017-07-19 VITALS — BP 133/83 | HR 75 | Temp 98.1°F | Ht 64.0 in | Wt 119.0 lb

## 2017-07-19 DIAGNOSIS — I10 Essential (primary) hypertension: Secondary | ICD-10-CM | POA: Diagnosis not present

## 2017-07-19 DIAGNOSIS — E876 Hypokalemia: Secondary | ICD-10-CM

## 2017-07-19 DIAGNOSIS — Z79899 Other long term (current) drug therapy: Secondary | ICD-10-CM | POA: Diagnosis not present

## 2017-07-19 DIAGNOSIS — G250 Essential tremor: Secondary | ICD-10-CM | POA: Insufficient documentation

## 2017-07-19 DIAGNOSIS — F1099 Alcohol use, unspecified with unspecified alcohol-induced disorder: Secondary | ICD-10-CM

## 2017-07-19 HISTORY — DX: Hypokalemia: E87.6

## 2017-07-19 NOTE — Patient Instructions (Addendum)
Thank you for visiting clinic today. It appears you had one episode of worsening of your tremors, we will check some lab today and will call you with any abnormal results. Continue taking your medication as that will help with your tremors and blood pressure both. If your symptoms recur or started becoming more frequent please come to the clinic.

## 2017-07-19 NOTE — Assessment & Plan Note (Addendum)
He has hypokalemia with potassium of 3.2 during last CMP done on June 30, 2017.  Repeat BMP, along with magnesium.  Addendum.  Patient remained hypokalemic with potassium of 3.2, sent a prescription of potassium 10 mEq daily for 2 weeks, discontinue chlorthalidone need to follow-up in 2 weeks for reevaluation of his blood pressure and BMP, may benefit with an ACE inhibitor. Called the patient with no answer, will try calling him again later today.

## 2017-07-19 NOTE — Assessment & Plan Note (Signed)
He has mild intention tremors, which do improve with alcohol intake. He came with a complaint of transient worsening of his tremors in his hands and both legs approximately 3 days ago.  Patient already on metoprolol. He had mild hypokalemia on previous lab work. No definitive explanation for his transient worsening of both hand and leg tremors. Patient is not diabetic he ate a small breakfast that morning and episode occurred around 1 PM, he normally takes lunch around 3 PM.  He denies any excessive alcohol intake preceding to this event.  No other red flag symptoms.  History of similar symptoms around Christmas time but he was hypotensive at that time. He was having normal neuro exam, just very mild intention tremors.  We will check his BMP to look for his potassium. Check magnesium. He was advised to continue taking metoprolol and let us know if any recurrence of those symptoms.

## 2017-07-19 NOTE — Progress Notes (Signed)
   CC: Transient bilateral hands and legs tremors 3 days ago.  HPI:  [name] is a 67 y.o. with past medical history significant for hypertension came to the clinic with complaint of having transient bilateral hands and legs tremor approximately 2-3 days ago.  Episode occurred around 1 PM and lasted for 15-20 minutes.  He denies any associated headache, dizziness, lightheadedness, palpitations, weakness or chest pain.  He denies any nausea or vomiting.  He has a similar episode around Christmas time and his blood pressure was high during that time.  He was started on metoprolol along with amlodipine and chlorthalidone and did not had any more episodes till this recent one.  He has mild intention tremors at baseline, most likely essential tremor as tremor improved with alcohol intake.  Patient drinks alcohol occasionally and denies any excessive drinking before that episode.  He denies any smoking.  He denies any other focal weakness.  Past Medical History:  Diagnosis Date  . Hypertension    Review of Systems: Negative except mentioned in HPI.  Physical Exam:  Vitals:   07/19/17 0942  BP: 133/83  Pulse: 75  Temp: 98.1 F (36.7 C)  TempSrc: Oral  SpO2: 100%  Weight: 119 lb (54 kg)  Height: 5\' 4"  (1.626 m)   General: Vital signs reviewed.  Patient is well-developed and well-nourished, in no acute distress and cooperative with exam.  Head: Normocephalic and atraumatic. Eyes: EOMI, conjunctivae normal, no scleral icterus.  Cardiovascular: RRR, S1 normal, S2 normal, no murmurs, gallops, or rubs. Pulmonary/Chest: Clear to auscultation bilaterally, no wheezes, rales, or rhonchi. Abdominal: Soft, non-tender, non-distended, BS +, no masses, organomegaly, or guarding present.  Extremities: Mild intention tremors of hands, no lower extremity edema bilaterally,  pulses symmetric and intact bilaterally. No cyanosis or clubbing. Neurological: A&O x3, Strength is normal and symmetric  bilaterally, cranial nerve II-XII are grossly intact, no focal motor deficit, sensory intact to light touch bilaterally.  Skin: Warm, dry and intact. No rashes or erythema. Psychiatric: Normal mood and affect. speech and behavior is normal. Cognition and memory are normal.  Assessment & Plan:   See Encounters Tab for problem based charting.  Patient seen with Dr. Evette Henry.

## 2017-07-19 NOTE — Assessment & Plan Note (Signed)
BP Readings from Last 3 Encounters:  07/19/17 133/83  06/30/17 135/75  05/22/17 (!) 141/70   His blood pressure was within goal. He is compliant with his amlodipine, metoprolol and chlorthalidone.  Continue current management.

## 2017-07-19 NOTE — Progress Notes (Signed)
Internal Medicine Clinic Attending  I saw and evaluated the patient.  I personally confirmed the key portions of the history and exam documented by Dr. Amin and I reviewed pertinent patient test results.  The assessment, diagnosis, and plan were formulated together and I agree with the documentation in the resident's note. 

## 2017-07-20 LAB — BMP8+ANION GAP
ANION GAP: 20 mmol/L — AB (ref 10.0–18.0)
BUN/Creatinine Ratio: 19 (ref 10–24)
BUN: 26 mg/dL (ref 8–27)
CO2: 24 mmol/L (ref 20–29)
CREATININE: 1.37 mg/dL — AB (ref 0.76–1.27)
Calcium: 9.8 mg/dL (ref 8.6–10.2)
Chloride: 91 mmol/L — ABNORMAL LOW (ref 96–106)
GFR, EST AFRICAN AMERICAN: 62 mL/min/{1.73_m2} (ref >59)
GFR, EST NON AFRICAN AMERICAN: 53 mL/min/{1.73_m2} — AB (ref >59)
Glucose: 109 mg/dL — ABNORMAL HIGH (ref 65–99)
Potassium: 3.2 mmol/L — ABNORMAL LOW (ref 3.5–5.2)
SODIUM: 135 mmol/L (ref 134–144)

## 2017-07-20 LAB — MAGNESIUM: Magnesium: 1.9 mg/dL (ref 1.6–2.3)

## 2017-07-20 MED ORDER — POTASSIUM CHLORIDE ER 10 MEQ PO TBCR: 10.0000 meq | EXTENDED_RELEASE_TABLET | Freq: Every day | ORAL | 0 refills | Status: DC

## 2017-07-20 NOTE — Addendum Note (Signed)
Addended by: Lorella Nimrod on: 07/20/2017 09:06 AM   Modules accepted: Orders

## 2017-09-07 ENCOUNTER — Telehealth: Payer: Self-pay | Admitting: Internal Medicine

## 2017-10-19 ENCOUNTER — Other Ambulatory Visit: Payer: Self-pay | Admitting: Internal Medicine

## 2017-10-23 NOTE — Telephone Encounter (Signed)
Needs follow up appointment with [facility], or [facility].  Needs CMET at that time

## 2017-10-25 ENCOUNTER — Other Ambulatory Visit: Payer: Self-pay | Admitting: Internal Medicine

## 2017-11-22 ENCOUNTER — Encounter: Payer: Self-pay | Admitting: *Deleted

## 2017-12-07 ENCOUNTER — Encounter: Payer: Self-pay | Admitting: Internal Medicine

## 2017-12-07 ENCOUNTER — Ambulatory Visit (INDEPENDENT_AMBULATORY_CARE_PROVIDER_SITE_OTHER): Payer: Medicare HMO | Admitting: Internal Medicine

## 2017-12-07 VITALS — BP 209/112 | HR 75 | Temp 98.2°F | Wt 115.1 lb

## 2017-12-07 DIAGNOSIS — E876 Hypokalemia: Secondary | ICD-10-CM

## 2017-12-07 DIAGNOSIS — I10 Essential (primary) hypertension: Secondary | ICD-10-CM

## 2017-12-07 DIAGNOSIS — Z79899 Other long term (current) drug therapy: Secondary | ICD-10-CM

## 2017-12-07 DIAGNOSIS — I2510 Atherosclerotic heart disease of native coronary artery without angina pectoris: Secondary | ICD-10-CM

## 2017-12-07 DIAGNOSIS — G250 Essential tremor: Secondary | ICD-10-CM

## 2017-12-07 DIAGNOSIS — Z0000 Encounter for general adult medical examination without abnormal findings: Secondary | ICD-10-CM

## 2017-12-07 MED ORDER — LISINOPRIL 20 MG PO TABS: 20.0000 mg | ORAL_TABLET | Freq: Every day | ORAL | 0 refills | Status: DC

## 2017-12-07 MED ORDER — METOPROLOL TARTRATE 50 MG PO TABS: 50.0000 mg | ORAL_TABLET | Freq: Two times a day (BID) | ORAL | 5 refills | Status: DC

## 2017-12-07 NOTE — Assessment & Plan Note (Addendum)
Hypertension-uncontrolled: Not within goal.  Found to have elevated BPs at the clinic with range 202-204/105-112.  He currently denies headache, lightheadedness, dizziness, nausea, chest pain, shortness of breath or abdominal pain.  He reports of being compliant with all his medications.  His home antihypertensive regimen are amlodipine 10 mg daily, metoprolol 50 mg BID.  He was last seen at the clinic on 07/23/2017 and his BP was 133/83.  I am concerned that patient might not be taking the right medications are appropriate medication regimen and have advised for him to bring his pill bottles at the next visit in 1 week.  He has been given strict return precautions and has been told if he begins experiencing headaches, lightheadedness, dizziness, chest pain or shortness of breath he should return to the clinic as soon as possible or to the nearest emergency department.  -Prescribe Lisinopril 20mg  qd -Continue Amlodipine 10mg  qd -Continue Metoprolol 50mg  BID -Advised to follow up with me in 1 week with ALL PILL bottles for reconciliation

## 2017-12-07 NOTE — Assessment & Plan Note (Signed)
Essential Tremor: Denies any recent episodes of tremors -Continue Metoprolol 50mg  BID

## 2017-12-07 NOTE — Assessment & Plan Note (Signed)
Hypokalemia: History of hypokalemia with K+ of 3.2 s/p 2 week course of KCl 24mEq.  -Will order BMP to follow

## 2017-12-07 NOTE — Patient Instructions (Signed)
[name],   It was a pleasure taking care of you here at the clinic.  Your blood pressure is elevated and here are is a summary of my plan:  1.  I am prescribing you a new blood pressure medication called lisinopril 20 mg which she will take daily in addition to the ones you have at home. 2.  You to come back to the clinic to see me in 1 week for blood pressure check.  Please bring all your pill bottles 3.  I am ordering some blood work today 4.  I am also giving you a stool sample kit to screen for cancer  NOTE: If you start experiencing chest pain, trouble breathing, headaches, blurry vision, dizziness please return to the clinic as soon as possible or go to the nearest emergency department.  ~Dr. Eileen Stanford

## 2017-12-07 NOTE — Progress Notes (Addendum)
   CC: Routine Follow up, HTN   HPI:  [name] is a 67 y.o. African-American gentleman with hypertension presenting today for routine follow-up.  He reports that he has been doing well otherwise and denies any complaints currently.   Hypertension-uncontrolled: Not within goal.  Found to have elevated BPs at the clinic with range 202-204/105-112.  He currently denies headache, lightheadedness, dizziness, nausea, chest pain, shortness of breath or abdominal pain.  He reports of being compliant with all his medications.  His home antihypertensive regimen are amlodipine 10 mg daily, metoprolol 50 mg BID.  He was last seen at the clinic on 07/23/2017 and his BP was 133/83.  I am concerned that patient might not be taking the right medications are appropriate medication regimen and have advised for him to bring his pill bottles at the next visit in 1 week.  He has been given strict return precautions and has been told if he begins experiencing headaches, lightheadedness, dizziness, chest pain or shortness of breath he should return to the clinic as soon as possible or to the nearest emergency department.  -Prescribe Lisinopril 20mg  qd -Continue Amlodipine 10mg  qd -Continue Metoprolol 50mg  BID -Advised to follow up with me in 1 week with ALL PILL bottles for reconciliation   ASCVD: ASCVD score of 21.1%  -Continue Atorvastatin 40mg    Essential Tremor: Denies any recent episodes of tremors -Continue Metoprolol 50mg  BID   Hypokalemia: History of hypokalemia with K+ of 3.2 s/p 2 week course of KCl 82mEq.  -Will order BMP to follow     Past Medical History:  Diagnosis Date  . Hypertension    Review of Systems:  As per HPI   Physical Exam:  Vitals:   12/07/17 1435 12/07/17 1502 12/07/17 1503  BP: (!) 204/108 (!) 202/105 (!) 209/112  Pulse: 77 87 75  Temp: 98.2 F (36.8 C)    TempSrc: Oral    SpO2: 100% 99% 100%  Weight: 115 lb 1.6 oz (52.2 kg)      Const: In NAD, very pleasant  gentleman  CV: RRR, no MGR Resp: CTABL, no wheezes, crackles, rhonchi  Abd: BS+, non distended   Assessment & Plan:   See Encounters Tab for problem based charting.  Patient discussed with Dr. Evette Doffing

## 2017-12-07 NOTE — Assessment & Plan Note (Signed)
ASCVD: ASCVD score of 21.1%  -Continue Atorvastatin 40mg 

## 2017-12-08 ENCOUNTER — Encounter: Payer: Self-pay | Admitting: Internal Medicine

## 2017-12-08 ENCOUNTER — Telehealth: Payer: Self-pay | Admitting: Internal Medicine

## 2017-12-08 LAB — BMP8+ANION GAP
Anion Gap: 19 mmol/L — ABNORMAL HIGH (ref 10.0–18.0)
BUN/Creatinine Ratio: 11 (ref 10–24)
BUN: 10 mg/dL (ref 8–27)
CALCIUM: 9.3 mg/dL (ref 8.6–10.2)
CHLORIDE: 101 mmol/L (ref 96–106)
CO2: 23 mmol/L (ref 20–29)
Creatinine, Ser: 0.95 mg/dL (ref 0.76–1.27)
GFR calc Af Amer: 95 mL/min/{1.73_m2} (ref >59)
GFR calc non Af Amer: 82 mL/min/{1.73_m2} (ref >59)
GLUCOSE: 85 mg/dL (ref 65–99)
Potassium: 4.4 mmol/L (ref 3.5–5.2)
Sodium: 143 mmol/L (ref 134–144)

## 2017-12-08 NOTE — Telephone Encounter (Signed)
I called patient to discuss lab results. Unfortunately, it went to voicemail. I will follow up by sending a letter or trying to reach out to patient again.

## 2017-12-11 NOTE — Progress Notes (Signed)
Internal Medicine Clinic Attending  I saw and evaluated the patient.  I personally confirmed the key portions of the history and exam documented by Dr. Agyei and I reviewed pertinent patient test results.  The assessment, diagnosis, and plan were formulated together and I agree with the documentation in the resident's note.  

## 2017-12-14 ENCOUNTER — Ambulatory Visit: Payer: Medicare HMO

## 2017-12-22 ENCOUNTER — Ambulatory Visit: Payer: Medicare HMO

## 2018-02-16 ENCOUNTER — Ambulatory Visit (INDEPENDENT_AMBULATORY_CARE_PROVIDER_SITE_OTHER): Payer: Medicare HMO | Admitting: Internal Medicine

## 2018-02-16 ENCOUNTER — Encounter: Payer: Self-pay | Admitting: Internal Medicine

## 2018-02-16 ENCOUNTER — Other Ambulatory Visit: Payer: Self-pay

## 2018-02-16 ENCOUNTER — Telehealth: Payer: Self-pay | Admitting: *Deleted

## 2018-02-16 VITALS — BP 170/94 | HR 85 | Temp 98.7°F | Ht 64.0 in | Wt 109.7 lb

## 2018-02-16 DIAGNOSIS — R634 Abnormal weight loss: Secondary | ICD-10-CM | POA: Diagnosis not present

## 2018-02-16 DIAGNOSIS — R945 Abnormal results of liver function studies: Secondary | ICD-10-CM | POA: Diagnosis not present

## 2018-02-16 DIAGNOSIS — R110 Nausea: Secondary | ICD-10-CM

## 2018-02-16 DIAGNOSIS — Z79899 Other long term (current) drug therapy: Secondary | ICD-10-CM

## 2018-02-16 DIAGNOSIS — R630 Anorexia: Secondary | ICD-10-CM

## 2018-02-16 DIAGNOSIS — R740 Nonspecific elevation of levels of transaminase and lactic acid dehydrogenase [LDH]: Secondary | ICD-10-CM | POA: Diagnosis not present

## 2018-02-16 DIAGNOSIS — E872 Acidosis: Secondary | ICD-10-CM

## 2018-02-16 DIAGNOSIS — I10 Essential (primary) hypertension: Secondary | ICD-10-CM | POA: Diagnosis not present

## 2018-02-16 DIAGNOSIS — Z681 Body mass index (BMI) 19 or less, adult: Secondary | ICD-10-CM | POA: Diagnosis not present

## 2018-02-16 DIAGNOSIS — R531 Weakness: Secondary | ICD-10-CM

## 2018-02-16 LAB — GLUCOSE, CAPILLARY: Glucose-Capillary: 124 mg/dL — ABNORMAL HIGH (ref 70–99)

## 2018-02-16 MED ORDER — LISINOPRIL 10 MG PO TABS: 10.0000 mg | ORAL_TABLET | Freq: Every day | ORAL | 2 refills | Status: DC

## 2018-02-16 NOTE — Assessment & Plan Note (Signed)
Blood pressure is uncontrolled today, 170/94.  He has 4 prescription bottles with him.  He is taking amlodipine 10 mg daily, metoprolol 50 mg twice daily, chlorthalidone 50 mg daily, and atorvastatin 40 mg daily.  Per last PCP note, he should be taking lisinopril, not chlorthalidone.  He has been experiencing poor p.o. intake over the past few weeks with unintentional weight loss.  Endorsing dizziness, orthostatic vitals today are negative. Continue current regimen, will add back on lisinopril.  -- Continue metoprolol 50 mg BID -- Continue amlodipine 10 mg daily -- Continue chlorthalidone 50 mg daily -- Start lisinopril 10 mg daily  --F/u CMP

## 2018-02-16 NOTE — Assessment & Plan Note (Addendum)
Patient is here after an episode this morning where he woke up from sleep feeling very diaphoretic, tremulous, weak in his legs, and nauseous.  The episode lasted approximately 20 minutes and then resolved.  He reports poor appetite over the past few weeks and had not eaten in approximately 24 hours prior to the episode.  Today he has only eaten fruit.  He denies dysphasia and odynophagia.  No early satiety.  Per chart review, he has lost 6 pounds unintentionally in 2 months.  On exam he is elderly and cachectic appearing.  BMI is 18.8.  Otherwise exam is benign and unremarkable.  CBG today in clinic is 125, however description of symptoms is highly suspicious for hypoglycemic episode.  He is not a diabetic.  Unclear reason for unintentional weight loss.  He is overdue for colonoscopy.  He is a never smoker.  We will start with basic screening labs, refer for colonoscopy with close follow-up. Instructed patient to present to the ED or call 911 if symptoms recur or become persistent.  --Encourage p.o. Intake --CBC with differential, CMP, TSH, ferritin, and HIV antibody --Follow-up early next week  ADDENDUM: Overall labs are unremarkable aside from a mild transaminitis that is unchanged and a persistent / worsening anion gap metabolic acidosis (gap of 26). Based on anion gap and expected change in bicarb, there is also an underlying metabolic alkalosis (bicarb normal, 21). Unclear etiology, possibly starvation ketoacidosis with his unintentional weight loss. Called patient with results. His acute symptoms have resolved. He denies any further episodes, but continues to have poor appetite. Recommend treating patient clinically and monitoring gap acidosis for now. Could consider ABG in future for complete work up. Will continue with work up for unintentional weight loss.

## 2018-02-16 NOTE — Progress Notes (Signed)
   CC: Weakness   HPI:  [name] is a 67 y.o. male with past medical history outlined below here for weakness. For the details of today's visit, please refer to the assessment and plan.  Past Medical History:  Diagnosis Date  . Hypertension     Review of Systems  Constitutional: Positive for diaphoresis.  Gastrointestinal: Positive for nausea.  Neurological: Positive for weakness.     Physical Exam:  Vitals:   02/16/18 1617  BP: (!) 170/94  Pulse: 85  Temp: 98.7 F (37.1 C)  TempSrc: Oral  SpO2: 100%  Weight: 109 lb 11.2 oz (49.8 kg)  Height: 5\' 4"  (1.626 m)    Constitutional: NAD, appears comfortable Cardiovascular: RRR, no murmurs, rubs, or gallops.  Pulmonary/Chest: CTAB, no wheezes, rales, or rhonchi. Abdominal: Soft, non tender, non distended. +BS.  Psychiatric: Normal mood and affect  Assessment & Plan:   See Encounters Tab for problem based charting.  Patient discussed with Dr. Rebeca Alert

## 2018-02-16 NOTE — Telephone Encounter (Signed)
CALLED PATIENT REGARDING GI REFERRAL AS TO WHERE HE WOULD LIKE TO BE SEEN.  [facility] ON CALL.

## 2018-02-16 NOTE — Patient Instructions (Signed)
Dan Henry,  It was a pleasure to meet you. I am sorry to hear you are not feeling well. Please continue to take your medicines as previously prescribed. I am adding another medicine called lisinopril. Please take this once a day in addition to your other meds.   I will call you Monday with the results of your blood work. Please follow up with [place] early next week (Monday or Tuesday). I have referred you to a gastroenterologist for a colonoscopy.   If your symptoms worsen or become persistent, please go to the emergency room or call 911.   If you have any questions or concerns, call our clinic at [phone] or after hours call [phone] and ask for the internal medicine resident on call. Thank you!  Dr. Philipp Ovens

## 2018-02-17 LAB — CBC WITH DIFFERENTIAL/PLATELET
Basophils Absolute: 0 10*3/uL (ref 0.0–0.2)
Basos: 1 %
EOS (ABSOLUTE): 0 10*3/uL (ref 0.0–0.4)
EOS: 0 %
HEMATOCRIT: 41.1 % (ref 37.5–51.0)
Hemoglobin: 14.1 g/dL (ref 13.0–17.7)
IMMATURE GRANS (ABS): 0 10*3/uL (ref 0.0–0.1)
IMMATURE GRANULOCYTES: 0 %
LYMPHS: 14 %
Lymphocytes Absolute: 0.8 10*3/uL (ref 0.7–3.1)
MCH: 30.8 pg (ref 26.6–33.0)
MCHC: 34.3 g/dL (ref 31.5–35.7)
MCV: 90 fL (ref 79–97)
Monocytes Absolute: 0.4 10*3/uL (ref 0.1–0.9)
Monocytes: 7 %
NEUTROS PCT: 78 %
Neutrophils Absolute: 4.3 10*3/uL (ref 1.4–7.0)
PLATELETS: 209 10*3/uL (ref 150–450)
RBC: 4.58 x10E6/uL (ref 4.14–5.80)
RDW: 13.7 % (ref 12.3–15.4)
WBC: 5.5 10*3/uL (ref 3.4–10.8)

## 2018-02-17 LAB — CMP14 + ANION GAP
ALT: 56 IU/L — AB (ref 0–44)
ANION GAP: 26 mmol/L — AB (ref 10.0–18.0)
AST: 87 IU/L — AB (ref 0–40)
Albumin/Globulin Ratio: 1.7 (ref 1.2–2.2)
Albumin: 5.3 g/dL — ABNORMAL HIGH (ref 3.6–4.8)
Alkaline Phosphatase: 67 IU/L (ref 39–117)
BUN/Creatinine Ratio: 13 (ref 10–24)
BUN: 15 mg/dL (ref 8–27)
Bilirubin Total: 1 mg/dL (ref 0.0–1.2)
CALCIUM: 10.3 mg/dL — AB (ref 8.6–10.2)
CO2: 21 mmol/L (ref 20–29)
CREATININE: 1.15 mg/dL (ref 0.76–1.27)
Chloride: 93 mmol/L — ABNORMAL LOW (ref 96–106)
GFR calc Af Amer: 76 mL/min/{1.73_m2} (ref >59)
GFR, EST NON AFRICAN AMERICAN: 65 mL/min/{1.73_m2} (ref >59)
GLOBULIN, TOTAL: 3.1 g/dL (ref 1.5–4.5)
GLUCOSE: 132 mg/dL — AB (ref 65–99)
Potassium: 3.9 mmol/L (ref 3.5–5.2)
Sodium: 140 mmol/L (ref 134–144)
Total Protein: 8.4 g/dL (ref 6.0–8.5)

## 2018-02-17 LAB — FERRITIN: FERRITIN: 220 ng/mL (ref 30–400)

## 2018-02-17 LAB — TSH: TSH: 0.404 u[IU]/mL — ABNORMAL LOW (ref 0.450–4.500)

## 2018-02-17 LAB — HIV ANTIBODY (ROUTINE TESTING W REFLEX): HIV SCREEN 4TH GENERATION: NONREACTIVE

## 2018-02-19 ENCOUNTER — Encounter: Payer: Self-pay | Admitting: Gastroenterology

## 2018-02-19 ENCOUNTER — Telehealth: Payer: Self-pay | Admitting: *Deleted

## 2018-02-19 NOTE — Telephone Encounter (Signed)
Pt calls for lab results from 10/18, per dr guilloud [facility] appt 10/22 at 1445. Will discuss then. Pt is agreeable

## 2018-02-20 ENCOUNTER — Ambulatory Visit (INDEPENDENT_AMBULATORY_CARE_PROVIDER_SITE_OTHER): Payer: Medicare HMO | Admitting: Internal Medicine

## 2018-02-20 ENCOUNTER — Encounter: Payer: Self-pay | Admitting: Internal Medicine

## 2018-02-20 VITALS — BP 145/77 | HR 100 | Temp 98.4°F | Wt 112.4 lb

## 2018-02-20 DIAGNOSIS — R634 Abnormal weight loss: Secondary | ICD-10-CM

## 2018-02-20 DIAGNOSIS — E872 Acidosis, unspecified: Secondary | ICD-10-CM

## 2018-02-20 DIAGNOSIS — Z23 Encounter for immunization: Secondary | ICD-10-CM

## 2018-02-20 DIAGNOSIS — R160 Hepatomegaly, not elsewhere classified: Secondary | ICD-10-CM | POA: Diagnosis not present

## 2018-02-20 DIAGNOSIS — E8729 Other acidosis: Secondary | ICD-10-CM

## 2018-02-20 DIAGNOSIS — R740 Nonspecific elevation of levels of transaminase and lactic acid dehydrogenase [LDH]: Secondary | ICD-10-CM | POA: Diagnosis not present

## 2018-02-20 DIAGNOSIS — Z681 Body mass index (BMI) 19 or less, adult: Secondary | ICD-10-CM

## 2018-02-20 HISTORY — DX: Acidosis: E87.2

## 2018-02-20 HISTORY — DX: Acidosis, unspecified: E87.20

## 2018-02-20 LAB — BLOOD GAS, ARTERIAL
Acid-base deficit: 1.7 mmol/L (ref 0.0–2.0)
BICARBONATE: 21.9 mmol/L (ref 20.0–28.0)
Drawn by: 205171
O2 SAT: 98.2 %
PCO2 ART: 33.4 mmHg (ref 32.0–48.0)
PO2 ART: 124 mmHg — AB (ref 83.0–108.0)
Patient temperature: 98.6
pH, Arterial: 7.433 (ref 7.350–7.450)

## 2018-02-20 LAB — BASIC METABOLIC PANEL
Anion gap: 10 (ref 5–15)
BUN: 34 mg/dL — ABNORMAL HIGH (ref 8–23)
CHLORIDE: 101 mmol/L (ref 98–111)
CO2: 24 mmol/L (ref 22–32)
Calcium: 9.8 mg/dL (ref 8.9–10.3)
Creatinine, Ser: 1.46 mg/dL — ABNORMAL HIGH (ref 0.61–1.24)
GFR calc non Af Amer: 48 mL/min — ABNORMAL LOW (ref >60)
GFR, EST AFRICAN AMERICAN: 56 mL/min — AB (ref >60)
Glucose, Bld: 117 mg/dL — ABNORMAL HIGH (ref 70–99)
Potassium: 3.6 mmol/L (ref 3.5–5.1)
Sodium: 135 mmol/L (ref 135–145)

## 2018-02-20 NOTE — Patient Instructions (Signed)
FOLLOW-UP INSTRUCTIONS When: 4-6 wks at your PCP's next available appointment time For: With Dr. Eileen Stanford What to bring: All of your medications  I have made not made any changes to your medications today.   Today we discussed your weight loss and persistent anion gap which is concerning given your weight loss. Today , we have ordered and will perform an lab test called and ABG to better identify the source of the abnormality.    Thank you for your visit to the [facility] today. If you have any questions or concerns please call us at [phone].

## 2018-02-20 NOTE — Assessment & Plan Note (Addendum)
Anion gap: Will obtain an ABG and BMP today to better evaluate.   Anion gap not present on BMP today with sodium 135-(chloride 101+ bicarb 24)= 10. ABG at 7.43/33.4/124/22 indicating a mild respiratory alkalosis currently without metabolic process which I suspect may be from hyperventilation due to the pain of having the procedure.  Plan:  Initial anion gap likely due to decreased PO intake, now resolved. Would monitor only with routine BMP for other causes or if repeat is indicated due to reoccurrence of his dietary concerns and/or worsening weight loss.  Called to inform patient of his normal lab results and that no further evaluation would be needed. He did not answer, left voicemail for him to call back.

## 2018-02-20 NOTE — Progress Notes (Signed)
   CC: Follow-up for weight loss  HPI:[name] is a 67 y.o. male who presents for evaluation of weight loss of unspecified origin. Please see individual problem based A/P for details.  Unexplained weightloss: Decreased appetite and weight loss from 126 to 112 for a total of 14lbs over the last three years. He stated that he has had a decreased desire to eat but feels much better today. He continues to deny dysphasia or odynophagia.  He denies early satiety.  He stated that his diet very significantly they simply felt less like eating until 3 days prior when his diet returned. ~6% weight loss in past 13 months, fluctuating pattern, patient endorses decreased PO intake.  Recent US abdomin limited indicated a possible hemangioma in the right hepatic lobe with repeat US advised at 6 months.  Will send recommendation to PCP for consideration of repeat imaging given weight loss.  Labs for HIV, ferritin, TSH, CBC, CMP, remarkable for elevated transaminases. Hep C/A and B with negative for Hep C, positive Hep A and nonimmune to Hep B.   Plan: Would recommend repeat abdominal US vs CT abdomin due to hepatic mass as well as elevated transaminases in the setting of weight loss.  Will notify patient that he will need Hep B vaccination  Unaware of a relapsing form of Hep A that would affect the patient at this stage.  We have ordered his colon cancer screening, he is in agreement with having this completed.  Will monitor. Weight appears stable today.  Patient will need to consider Hep B vaccination, can use the two dose series one month apart in our clinic.   Anion gap: Will obtain an ABG and BMP today to better evaluate.   Anion gap not present on BMP today with sodium 135-(chloride 101+ bicarb 24)= 10. ABG at 7.43/33.4/124/22 indicating a mild respiratory alkalosis currently without metabolic process which I suspect may be from hyperventilation due to the pain of having the procedure.  Plan:    Initial anion gap likely due to decreased PO intake, now resolved. Would monitor only with routine BMP for other causes or if repeat is indicated due to reoccurrence of his dietary concerns and/or worsening weight loss.  Called to inform patient of his normal lab results and that no further evaluation would be needed. He did not answer, left voicemail for him to call back.  PHQ-9: Based on the patients    Office Visit from 02/20/2018 in [facility]  PHQ-9 Total Score  5     score we have decided to monitor.  Past Medical History:  Diagnosis Date  . Hypertension    Review of Systems:  ROS negative except as per HPI.  Physical Exam: Vitals:   02/20/18 1422  BP: (!) 145/77  Pulse: 100  Temp: 98.4 F (36.9 C)  TempSrc: Oral  SpO2: 100%  Weight: 112 lb 6.4 oz (51 kg)   General: A/O x4, in no acute distress, afebrile, nondiaphoretic Cardio: RRR, no mrg's Pulmonary: CTA bilaterally MSK: BLE nontender, nonedematous  Assessment & Plan:   See Encounters Tab for problem based charting.  Patient discussed with Dr. Lynnae January

## 2018-02-21 DIAGNOSIS — Z23 Encounter for immunization: Secondary | ICD-10-CM | POA: Insufficient documentation

## 2018-02-21 NOTE — Progress Notes (Signed)
Internal Medicine Clinic Attending  Case discussed with Dr. Harbrecht at the time of the visit.  We reviewed the resident's history and exam and pertinent patient test results.  I agree with the assessment, diagnosis, and plan of care documented in the resident's note.   

## 2018-02-21 NOTE — Progress Notes (Signed)
Internal Medicine Clinic Attending  Case discussed with Dr. Philipp Ovens at the time of the visit.  We reviewed the resident's history and exam and pertinent patient test results.  I agree with the assessment, diagnosis, and plan of care documented in the resident's note.  Here with unintentional weight loss and poor appetite, unclear etiology. Labs are notable for persistent elevated anion gap (26 today), which is worse than prior but has been present for at least 2 years but has never been worked up. Likely has combined anion gap metabolic acidosis with additional metabolic alkalosis.   At next visit, would strongly consider checking blood gas, lactic acid (for possible D-lactic acidosis), salicylates, urine pH and urine anion gap, acetaminophen and 5-oxoproline levels (if possible) as long-term use of acetaminophen can lead to accumulation of 5-oxoproline, which elevates the anion gap. Although unlikely, cyanide poisoning is also on the differential and should be considered.   Would also continue workup for unintentional weight loss, as his current trend is concerning.   Lenice Pressman, M.D., Ph.D.

## 2018-02-21 NOTE — Assessment & Plan Note (Signed)
Nonimmune as per Hep screening. Is candidate for Hep B vaccination in our clinic with two dose series. Patient did not answer phone today. Will request he return for nursing visit only for Hep B vaccine.

## 2018-02-21 NOTE — Assessment & Plan Note (Signed)
Unexplained weightloss: Decreased appetite and weight loss from 126 to 112 for a total of 14lbs over the last three years. He stated that he has had a decreased desire to eat but feels much better today. He continues to deny dysphasia or odynophagia.  He denies early satiety.  He stated that his diet very significantly they simply felt less like eating until 3 days prior when his diet returned. ~6% weight loss in past 13 months, fluctuating pattern, patient endorses decreased PO intake.  Recent US abdomin limited indicated a possible hemangioma in the right hepatic lobe with repeat US advised at 6 months.  Will send recommendation to PCP for consideration of repeat imaging given weight loss.  Labs for HIV, ferritin, TSH, CBC, CMP, remarkable for elevated transaminases. Hep C/A and B with negative for Hep C, positive Hep A and nonimmune to Hep B.   Plan: Would recommend repeat abdominal US vs CT abdomin due to hepatic mass as well as elevated transaminases in the setting of weight loss.  Will notify patient that he will need Hep B vaccination  Unaware of a relapsing form of Hep A that would affect the patient at this stage.  We have ordered his colon cancer screening, he is in agreement with having this completed.  Will monitor. Weight appears stable today.  Patient will need to consider Hep B vaccination, can use the two dose series one month apart in our clinic.

## 2018-02-22 ENCOUNTER — Ambulatory Visit: Payer: Medicare HMO | Admitting: Gastroenterology

## 2018-03-06 ENCOUNTER — Other Ambulatory Visit: Payer: Self-pay | Admitting: Internal Medicine

## 2018-03-06 DIAGNOSIS — R634 Abnormal weight loss: Secondary | ICD-10-CM

## 2018-03-11 ENCOUNTER — Telehealth: Payer: Self-pay | Admitting: Internal Medicine

## 2018-03-11 NOTE — Telephone Encounter (Signed)
Tried calling patient to inform that I have placed an order for Abdominal Ultrasound but unable to reach.

## 2018-06-12 IMAGING — US US ABDOMEN LIMITED
1 series · 14 of 25 positions shown · non-contrast
Comparison: None in PACs

CLINICAL DATA: Elevated liver function studies

EXAM:
ULTRASOUND ABDOMEN LIMITED RIGHT UPPER QUADRANT

[Series 1: us abdomen limited · 0.17mm/px · 14 of 44 slices shown]
[im 1/44]
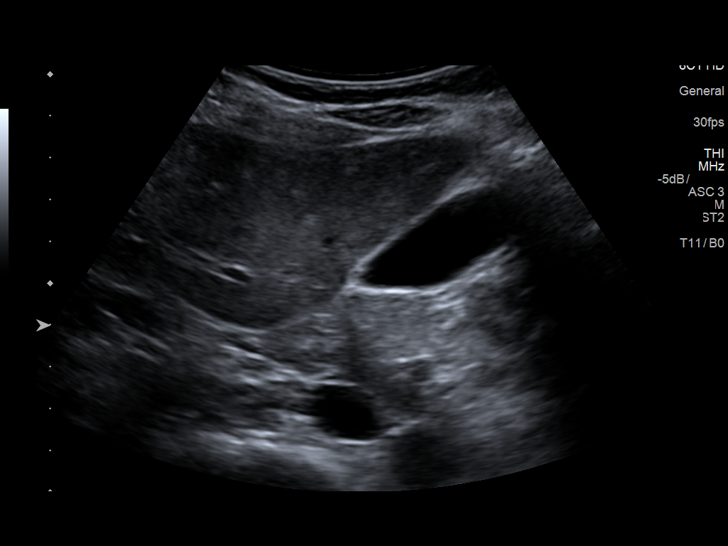
[im 4/44]
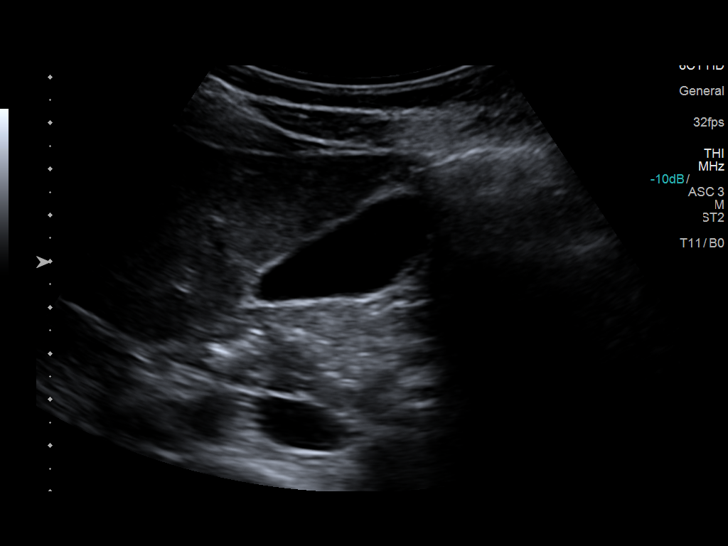
[im 8/44]
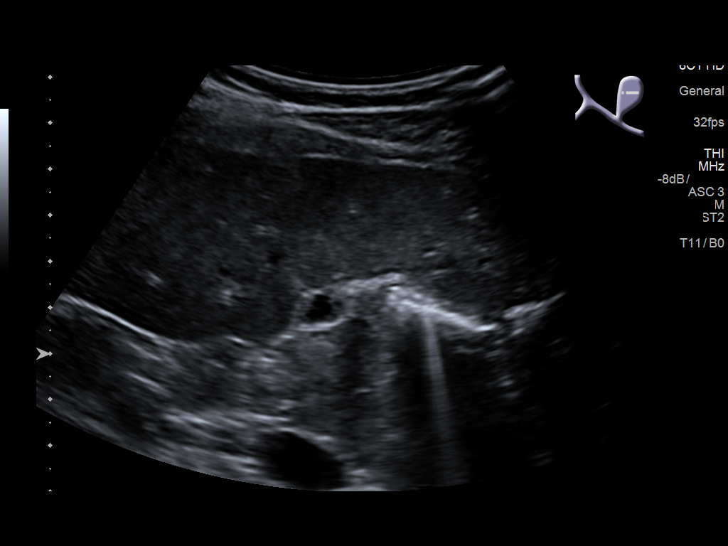
[im 11/44]
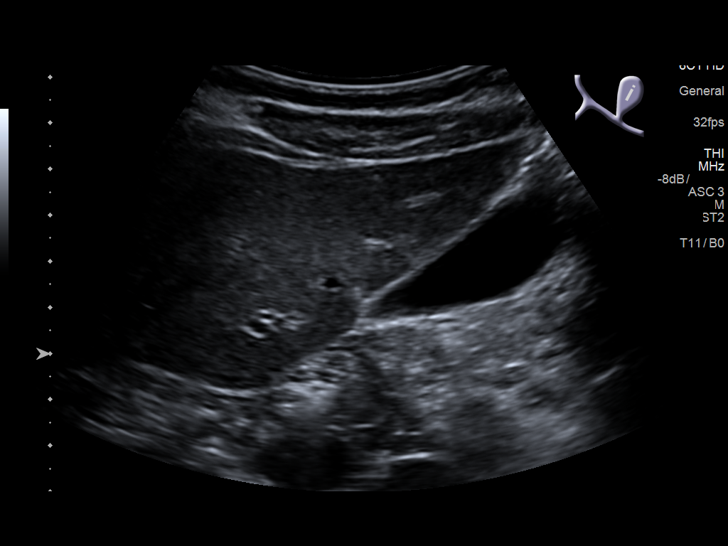
[im 15/44]
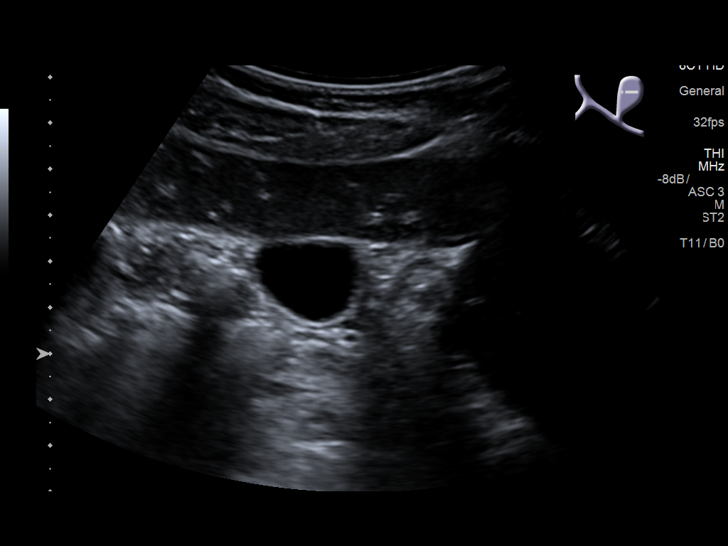
[im 17/44]
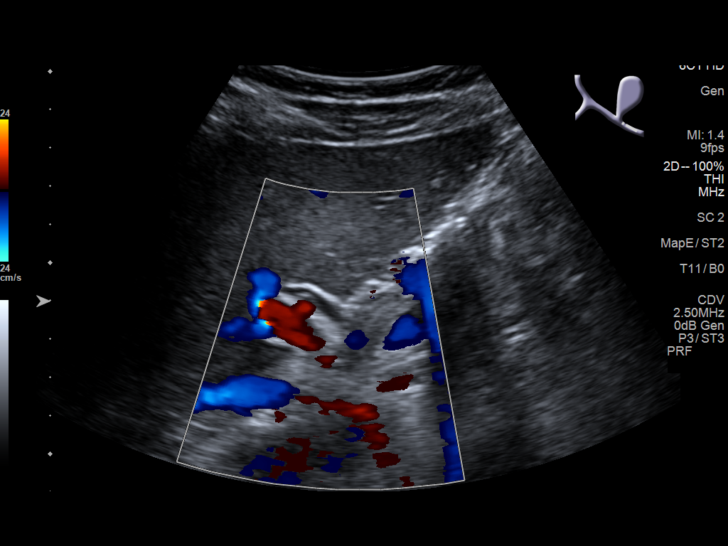
[im 20/44]
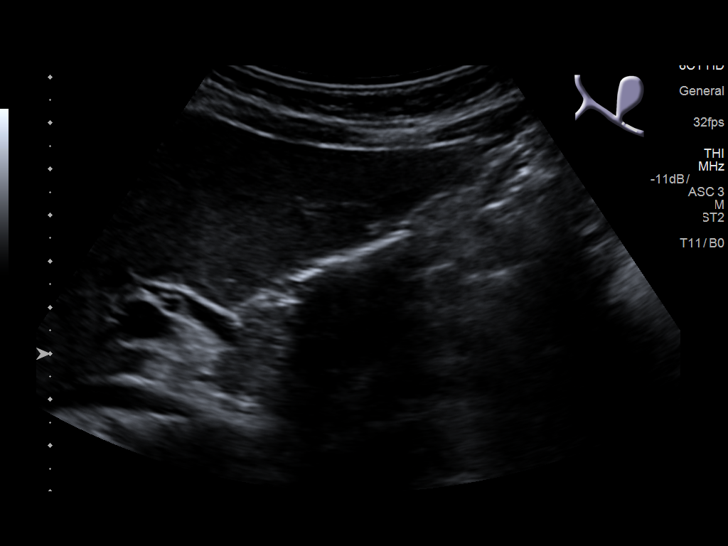
[im 24/44]
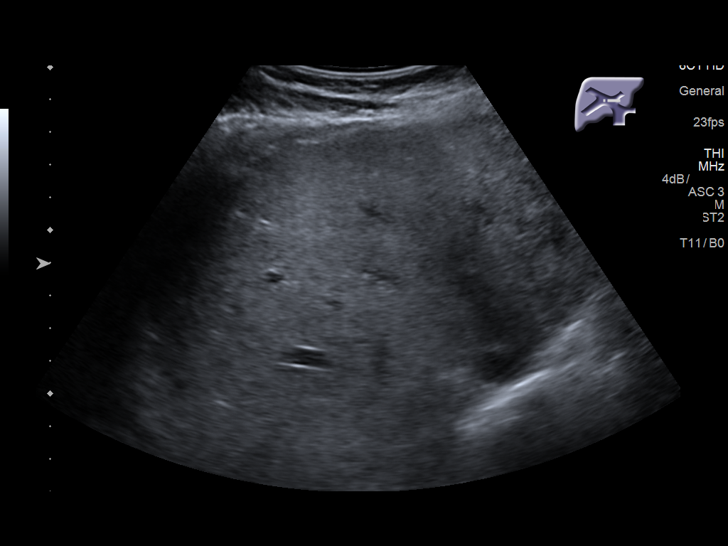
[im 27/44]
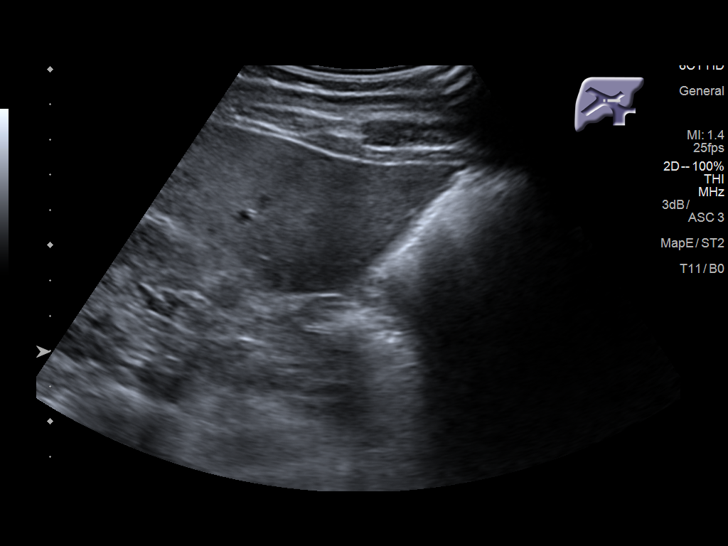
[im 29/44]
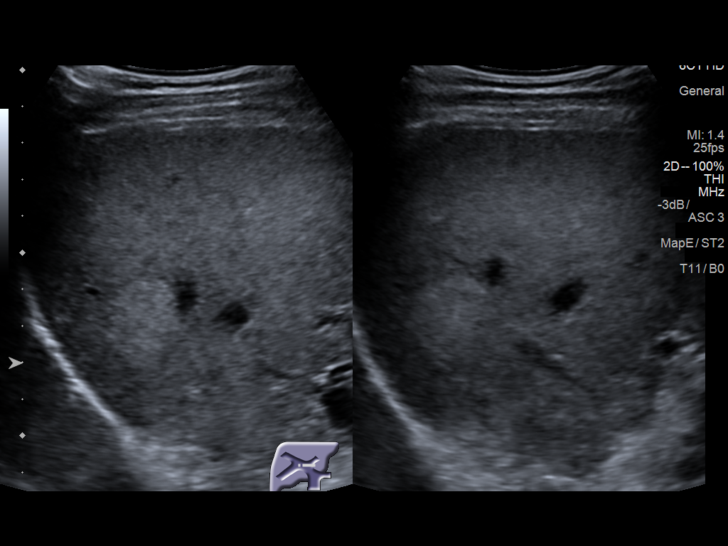
[im 33/44]
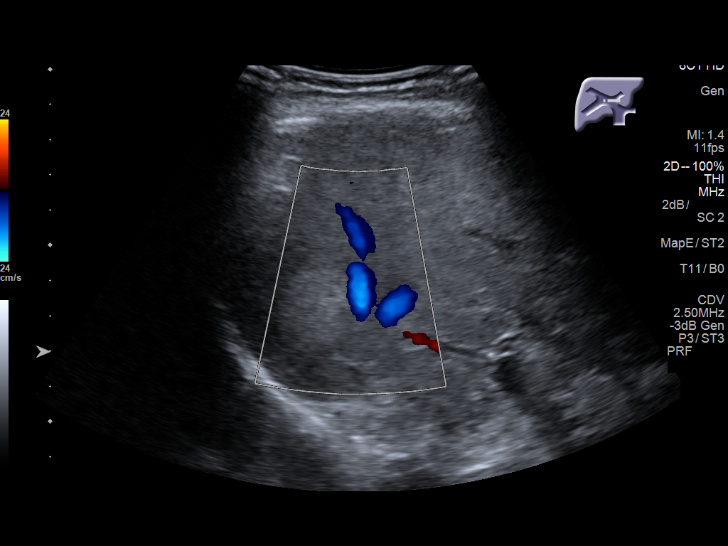
[im 36/44]
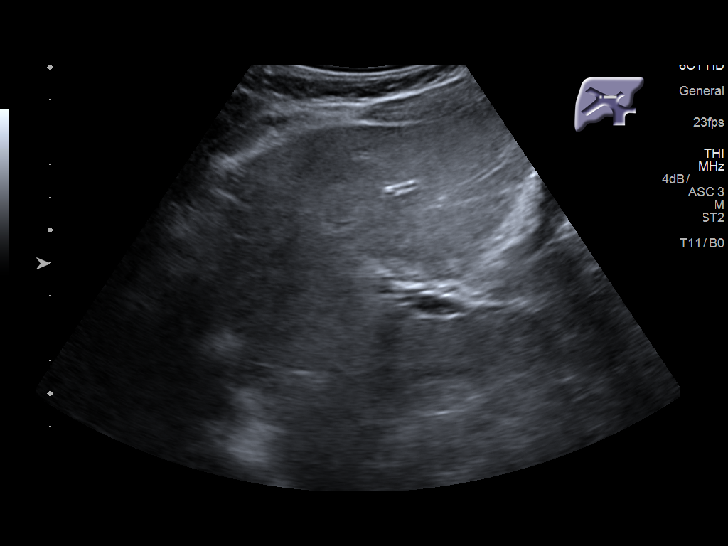
[im 40/44]
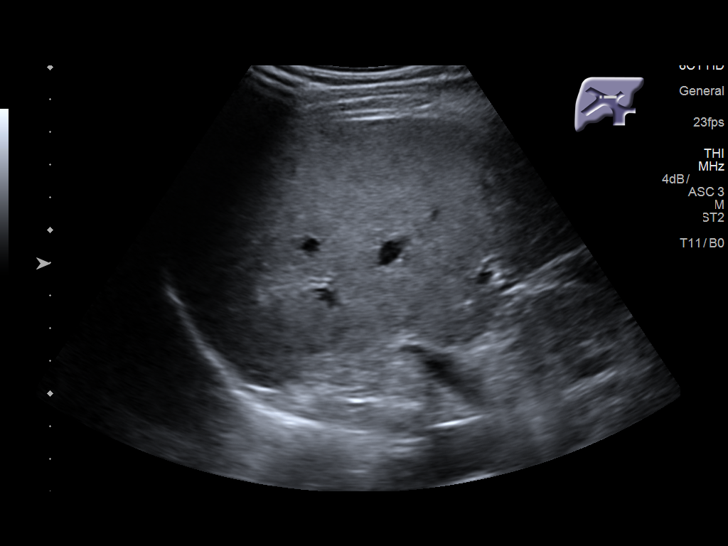
[im 44/44]
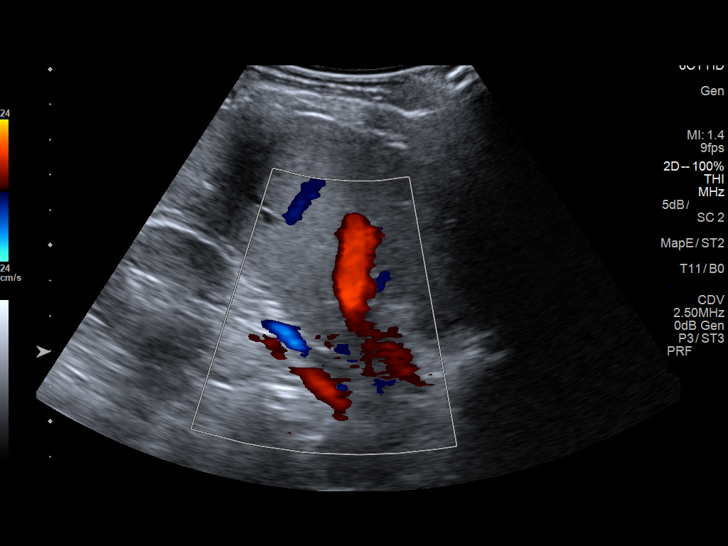

[14 of 25 positions shown; findings below may reference images not displayed]

FINDINGS: Gallbladder:

No gallstones or wall thickening visualized. No sonographic Murphy
sign noted by sonographer.

Common bile duct:

Diameter: 4.3 mm

Liver:

There is a well-marginated hyperechoic focus in the right hepatic
lobe measuring 2.1 x 2.7 x 2.8 cm.. Within normal limits in
parenchymal echogenicity. Portal vein is patent on color Doppler
imaging with normal direction of blood flow towards the liver.
IMPRESSION: Probable hemangioma in the right hepatic lobe. Follow-up ultrasound
in 6 months is recommended.

Otherwise normal appearing liver. Normal appearance of the
gallbladder and common bile duct.

## 2018-06-25 ENCOUNTER — Telehealth: Payer: Self-pay | Admitting: *Deleted

## 2018-06-25 NOTE — Telephone Encounter (Signed)
Agree with plan. Thanks

## 2018-06-25 NOTE — Telephone Encounter (Signed)
Pt is ask to call 911 if these continue or become worse. [place] 2/25 at 1415

## 2018-06-25 NOTE — Telephone Encounter (Signed)
Pt calls and states he thinks his medication has started causing him problems: hallucinations- seeing things that are not there,etc., dizziness, tremors of the legs, feverish, diaphoresis,

## 2018-06-26 ENCOUNTER — Ambulatory Visit (INDEPENDENT_AMBULATORY_CARE_PROVIDER_SITE_OTHER): Payer: Medicare HMO | Admitting: Internal Medicine

## 2018-06-26 VITALS — BP 145/84 | HR 66 | Temp 98.3°F | Ht 64.0 in | Wt 114.3 lb

## 2018-06-26 DIAGNOSIS — R634 Abnormal weight loss: Secondary | ICD-10-CM | POA: Diagnosis not present

## 2018-06-26 DIAGNOSIS — R7989 Other specified abnormal findings of blood chemistry: Secondary | ICD-10-CM | POA: Diagnosis not present

## 2018-06-26 DIAGNOSIS — Z79899 Other long term (current) drug therapy: Secondary | ICD-10-CM | POA: Diagnosis not present

## 2018-06-26 DIAGNOSIS — R42 Dizziness and giddiness: Secondary | ICD-10-CM | POA: Diagnosis not present

## 2018-06-26 DIAGNOSIS — R945 Abnormal results of liver function studies: Secondary | ICD-10-CM | POA: Diagnosis not present

## 2018-06-26 DIAGNOSIS — I10 Essential (primary) hypertension: Secondary | ICD-10-CM

## 2018-06-26 DIAGNOSIS — R441 Visual hallucinations: Secondary | ICD-10-CM | POA: Diagnosis not present

## 2018-06-26 NOTE — Progress Notes (Signed)
   CC: Visual hallucinations  HPI:  [name] is a 68 y.o. male with past medical history outlined below here for visual hallucinations. For the details of today's visit, please refer to the assessment and plan.  Past Medical History:  Diagnosis Date  . Hypertension     Review of Systems  Constitutional: Positive for diaphoresis.  Neurological: Positive for dizziness.    Physical Exam:  Vitals:   06/26/18 1405  BP: (!) 187/79  Pulse: 63  Temp: 98.3 F (36.8 C)  TempSrc: Oral  SpO2: 100%  Weight: 114 lb 4.8 oz (51.8 kg)  Height: 5\' 4"  (1.626 m)    Constitutional: NAD, appears comfortable Cardiovascular: RRR, no m/r/g Pulmonary/Chest: CTAB, no wheezes, rales, or rhonchi.  Abdominal: Soft, non tender, non distended. +BS.  Extremities: Warm and well perfused. No edema.  Psychiatric: Normal mood and affect  Assessment & Plan:   See Encounters Tab for problem based charting.  Patient discussed with Dr. Evette Doffing

## 2018-06-26 NOTE — Patient Instructions (Signed)
Mr. Stave,  It was a pleasure to see you again. I am sorry to hear about your recent episode and not feeling well.   Please continue to take all of your medications as previously prescribed. Make an appointment to follow up with [place] again in 2 weeks. I have ordered blood work and I will call you with the results. I have also ordered a repeat ultrasound of your abdomen. You will be called to schedule this. If you have any questions or concerns, call our clinic at [phone] or after hours call [phone] and ask for the internal medicine resident on call. Thank you!  Dr. Philipp Ovens

## 2018-06-27 ENCOUNTER — Other Ambulatory Visit: Payer: Self-pay | Admitting: Internal Medicine

## 2018-06-27 ENCOUNTER — Encounter: Payer: Self-pay | Admitting: Internal Medicine

## 2018-06-27 DIAGNOSIS — I10 Essential (primary) hypertension: Secondary | ICD-10-CM

## 2018-06-27 LAB — CMP14 + ANION GAP
A/G RATIO: 1.7 (ref 1.2–2.2)
ALT: 43 IU/L (ref 0–44)
AST: 59 IU/L — ABNORMAL HIGH (ref 0–40)
Albumin: 4.9 g/dL — ABNORMAL HIGH (ref 3.8–4.8)
Alkaline Phosphatase: 62 IU/L (ref 39–117)
Anion Gap: 17 mmol/L (ref 10.0–18.0)
BUN/Creatinine Ratio: 14 (ref 10–24)
BUN: 15 mg/dL (ref 8–27)
Bilirubin Total: 1.1 mg/dL (ref 0.0–1.2)
CALCIUM: 10.1 mg/dL (ref 8.6–10.2)
CO2: 23 mmol/L (ref 20–29)
CREATININE: 1.09 mg/dL (ref 0.76–1.27)
Chloride: 102 mmol/L (ref 96–106)
GFR, EST AFRICAN AMERICAN: 81 mL/min/{1.73_m2} (ref >59)
GFR, EST NON AFRICAN AMERICAN: 70 mL/min/{1.73_m2} (ref >59)
GLOBULIN, TOTAL: 2.9 g/dL (ref 1.5–4.5)
Glucose: 98 mg/dL (ref 65–99)
Potassium: 4.2 mmol/L (ref 3.5–5.2)
Sodium: 142 mmol/L (ref 134–144)
TOTAL PROTEIN: 7.8 g/dL (ref 6.0–8.5)

## 2018-06-27 LAB — CBC
HEMATOCRIT: 40.6 % (ref 37.5–51.0)
HEMOGLOBIN: 13.4 g/dL (ref 13.0–17.7)
MCH: 30.8 pg (ref 26.6–33.0)
MCHC: 33 g/dL (ref 31.5–35.7)
MCV: 93 fL (ref 79–97)
PLATELETS: 269 10*3/uL (ref 150–450)
RBC: 4.35 x10E6/uL (ref 4.14–5.80)
RDW: 13.9 % (ref 11.6–15.4)
WBC: 5 10*3/uL (ref 3.4–10.8)

## 2018-06-27 LAB — TSH: TSH: 0.468 u[IU]/mL (ref 0.450–4.500)

## 2018-06-27 NOTE — Assessment & Plan Note (Signed)
TSH checked at prior visit was very mildly depressed.  Given his current symptoms, will repeat TSH today.

## 2018-06-27 NOTE — Assessment & Plan Note (Addendum)
Blood pressure today is elevated, 187/79.  He is compliant with his amlodipine 10 mg daily, lisinopril 10 mg daily, metoprolol 50 mg twice daily, and chlorthalidone 50 mg daily.  He has his prescription bottles with him today.  On review of prior vitals, patient is intermittently uncontrolled with fluctuations in his blood pressure ranging from 758I-325Q systolic.  He presented today after an episode of visual "hallucinations" associated with diaphoresis, tremor, tachycardia, dizziness, and weakness.  He had a similar episode in 4 months ago for which he presented to clinic. Etiology at that time was not identified. Again he had experienced transient diaphoresis, tremor, tachycardia, and dizziness.  His prior episode was not associated with these visual disturbances.  Given his intermittently severely uncontrolled hypertension and episodic symptoms described above, it may be reasonable to work-up for pheochromocytoma.  If patient remains uncontrolled and plasma metanephrines are normal, consider work-up for other causes of secondary hypertension. --Continue current regimen - Follow-up plasma metanephrines  ADDENDUM: Plasma metanephrines are mildly elevated, in the indeterminate range. Patient will need 24-hour urinary fractionated catecholamines and metanephrines for further testing. Attempted to reach patient. Left voicemail that he needs to follow up for additional testing. He has an appointment scheduled in [facility] on 3/10.

## 2018-06-27 NOTE — Assessment & Plan Note (Signed)
Patient was previously noted to have a persistent mild elevation of his LFTs without clear source.  Hep A ab was found to be positive.  He is not Hep B immune but not at high risk.  RUQ ultrasound identified a probable hemangioma, six-month follow-up was recommended.   --Repeat LFTs - Repeat abdominal ultrasound

## 2018-06-27 NOTE — Progress Notes (Signed)
Internal Medicine Clinic Attending  Case discussed with Dr. Guilloud at the time of the visit.  We reviewed the resident's history and exam and pertinent patient test results.  I agree with the assessment, diagnosis, and plan of care documented in the resident's note.  

## 2018-06-27 NOTE — Assessment & Plan Note (Signed)
Patient is here after an episode of transient visual hallucinations.  Patient reports after waking in the morning he took his medications and ate his breakfast.  He walked back into the bedroom and sat down in his bed when he noticed shapes, lights, and human like figures on his bedroom wall that he described as "a movie".  He was aware that what he was seeing was not real.  It was very distressing for him.  These hallucinations were associated with tremor, diaphoresis, dizziness, heart palpitations, and feeling weak.  He got up and walked around.  The entire episode lasted approximately 45 minutes and has not recurred since.  He has never experienced hallucinations like this before.  On exam today he is alert and oriented with an intact neurological exam.  He does have an intention tremor that is not present at rest.  He denies memory loss.  Doubt parkinsonism or dementia with Lewy body, but this would be something to monitor for.  Also doubt medical delirium as he was not altered during the episode.  He denied headache making atypical migraine unlikely.  Overall unclear reason for visual hallucinations.  We are checking some basic lab work today and working up secondary causes for his hypertension.  Continue to monitor.

## 2018-06-27 NOTE — Telephone Encounter (Signed)
Refilled amlodipine 

## 2018-06-29 LAB — METANEPHRINES, PLASMA
METANEPHRINE FREE: 99 pg/mL — AB (ref 0–62)
NORMETANEPHRINE FREE: 145 pg/mL (ref 0–145)

## 2018-07-02 ENCOUNTER — Other Ambulatory Visit: Payer: Self-pay | Admitting: Internal Medicine

## 2018-07-03 NOTE — Telephone Encounter (Signed)
Pt has [facility] appt on 07/10/18.

## 2018-07-10 ENCOUNTER — Ambulatory Visit (INDEPENDENT_AMBULATORY_CARE_PROVIDER_SITE_OTHER): Payer: Medicare HMO | Admitting: Internal Medicine

## 2018-07-10 ENCOUNTER — Encounter: Payer: Self-pay | Admitting: Internal Medicine

## 2018-07-10 ENCOUNTER — Ambulatory Visit (HOSPITAL_COMMUNITY)
Admission: RE | Admit: 2018-07-10 | Discharge: 2018-07-10 | Disposition: A | Discharge status: Home / Self Care | Payer: Medicare HMO | Source: Ambulatory Visit | Attending: Internal Medicine | Admitting: Internal Medicine

## 2018-07-10 ENCOUNTER — Other Ambulatory Visit: Payer: Self-pay

## 2018-07-10 VITALS — BP 153/87 | HR 90 | Temp 98.1°F | Ht 64.0 in | Wt 116.7 lb

## 2018-07-10 DIAGNOSIS — I10 Essential (primary) hypertension: Secondary | ICD-10-CM | POA: Insufficient documentation

## 2018-07-10 DIAGNOSIS — K769 Liver disease, unspecified: Secondary | ICD-10-CM

## 2018-07-10 DIAGNOSIS — Z79899 Other long term (current) drug therapy: Secondary | ICD-10-CM

## 2018-07-10 NOTE — Progress Notes (Signed)
Case discussed with Dr. Agyei at the time of the visit.  We reviewed the resident's history and exam and pertinent patient test results.  I agree with the assessment, diagnosis and plan of care documented in the resident's note. 

## 2018-07-10 NOTE — Assessment & Plan Note (Addendum)
Uncontrolled hypertension: [name] is well-known to me given his  uncontrolled hypertension despite being on multiple antihypertensives including amlodipine 10 mg daily, lisinopril 10 mg daily, metoprolol 50 mg twice daily, chlorthalidone 50 mg daily.  He was last evaluated at the clinic by Dr. Philipp Ovens and was found to have an elevated blood pressure of 187/79.  In addition, he reported of visual hallucinations with associated diaphoresis, tachycardia, dizziness and weakness.  Is worth noting that in the past, his BP has recorded as low as 130s/80s.  At that visit, he began work-up for possible pheochromocytoma when he complained about visual hallucination, diaphoresis, palpitation and weakness.  Lab results showed elevated plasma metanephrine of 99 (which was indeterminate for pheochromocytoma).  On further chart review, he was noted to have several episodes of hypokalemia of 3.2 in March 2019 which makes me worry if he has a component of hyperaldosteronism.  Today, he reports he is doing well and denies any further episodes of visual hallucination, headache, dizziness, headedness, chest pain or shortness of breath.  He does report episodic palpitation, diaphoresis and intermittent lower extremity tremors.  Plan: - Order 24-hour urine fractionated catecholamines, VMA - Order toxAssure - Renin/aldosterone ratio - Abdominal ultrasound to evaluate renal arteries & follow-up on probable hepatic hemangioma - Continue current antihypertensive regimen (amlodipine 10 mg daily, lisinopril 10 mg daily, metoprolol 50 mg twice daily, chlorthalidone 50 mg daily).  ADDENDUM: -Renin/Aldosterone = Unremarkable -24 hour Urine VMA + Catecholamine = Unremarkable  -Abdominal U/S revealed 1.2cm hepatic lesion and recommend follor up MRI -ToxAssure = Ethyl Glucuronide and Ethyl Sulfate (advised abstinence from EtOH)

## 2018-07-10 NOTE — Progress Notes (Signed)
   CC: Follow-up hypertension  HPI:  [name] is a 68 y.o. with uncontrolled hypertension here for follow-up.  Please see problem based charting for further details.  Past Medical History:  Diagnosis Date  . Hypertension    Review of Systems: As per HPI  Physical Exam:  Vitals:   07/10/18 1423  BP: (!) 153/87  Pulse: 90  Temp: 98.1 F (36.7 C)  TempSrc: Oral  SpO2: 98%  Weight: 116 lb 11.2 oz (52.9 kg)  Height: 5\' 4"  (1.626 m)   Physical Exam Vitals signs reviewed.  Constitutional:      General: He is not in acute distress. HENT:     Head: Normocephalic and atraumatic.  Cardiovascular:     Rate and Rhythm: Normal rate and regular rhythm.     Heart sounds: Normal heart sounds. No murmur.  Pulmonary:     Effort: Pulmonary effort is normal.     Breath sounds: Normal breath sounds. No wheezing or rales.  Abdominal:     General: Bowel sounds are normal.  Neurological:     Mental Status: He is alert.  Psychiatric:        Mood and Affect: Mood normal.        Behavior: Behavior normal.     Assessment & Plan:   See Encounters Tab for problem based charting.  Patient discussed with Dr. Eppie Gibson

## 2018-07-10 NOTE — Patient Instructions (Addendum)
Dan Henry,   It was a pleasure taking care of you here at the clinic today.  Your blood pressure is a little better today so I am not going to make any changes to the medications.  I will have you continue all your blood pressure medications.  Here are my recommendations after today's visit:  1.  I am ordering for a urine test to investigate for potential secondary causes that could be driving your high blood pressure. 2.  Also, please follow-up to have the ultrasound of your abdomen performed. 3.  I would like for you to see you back here at the clinic in 2 weeks for blood pressure check  Dr. Eileen Stanford  Please call the internal medicine center clinic if you have any questions or concerns, we may be able to help and keep you from a long and expensive emergency room wait. Our clinic and after hours phone number is [phone], the best time to call is Monday through Friday 9 am to 4 pm but there is always someone available 24/7 if you have an emergency. If you need medication refills please notify your pharmacy one week in advance and they will send [place] a request.

## 2018-07-11 ENCOUNTER — Other Ambulatory Visit: Payer: Self-pay

## 2018-07-13 ENCOUNTER — Encounter: Payer: Self-pay | Admitting: Internal Medicine

## 2018-07-13 ENCOUNTER — Telehealth: Payer: Self-pay | Admitting: Internal Medicine

## 2018-07-13 ENCOUNTER — Telehealth: Payer: Self-pay | Admitting: *Deleted

## 2018-07-13 LAB — ALDOSTERONE + RENIN ACTIVITY W/ RATIO
ALDOS/RENIN RATIO: 1.1 (ref 0.0–30.0)
ALDOSTERONE: 3.8 ng/dL (ref 0.0–30.0)
Renin: 3.583 ng/mL/hr (ref 0.167–5.380)

## 2018-07-13 NOTE — Telephone Encounter (Signed)
I tried reaching out to Dan Henry to discuss results from aldosterone and renin unfortunately I was unable to reach him.  I will follow this up by sending a letter.

## 2018-07-13 NOTE — Telephone Encounter (Signed)
I called [name] 07-13-2018 at 10:25 Left a voice mail asking for a return phone call. I need to review 24hr urine collection instructions with [name].  Maryan Rued, [facility] [phone]

## 2018-07-16 ENCOUNTER — Other Ambulatory Visit: Payer: Self-pay | Admitting: Internal Medicine

## 2018-07-16 DIAGNOSIS — I10 Essential (primary) hypertension: Secondary | ICD-10-CM

## 2018-07-16 LAB — TOXASSURE SELECT,+ANTIDEPR,UR

## 2018-07-19 ENCOUNTER — Other Ambulatory Visit: Payer: Self-pay | Admitting: Internal Medicine

## 2018-07-19 DIAGNOSIS — R945 Abnormal results of liver function studies: Secondary | ICD-10-CM

## 2018-07-19 DIAGNOSIS — I10 Essential (primary) hypertension: Secondary | ICD-10-CM

## 2018-07-19 DIAGNOSIS — R7989 Other specified abnormal findings of blood chemistry: Secondary | ICD-10-CM

## 2018-07-19 DIAGNOSIS — R634 Abnormal weight loss: Secondary | ICD-10-CM

## 2018-07-19 NOTE — Addendum Note (Signed)
Addended by: [name] on: 07/19/2018 10:22 AM   Modules accepted: Orders

## 2018-07-20 ENCOUNTER — Other Ambulatory Visit: Payer: Medicare HMO

## 2018-07-20 ENCOUNTER — Other Ambulatory Visit: Payer: Self-pay

## 2018-07-20 DIAGNOSIS — I10 Essential (primary) hypertension: Secondary | ICD-10-CM

## 2018-07-23 ENCOUNTER — Ambulatory Visit (HOSPITAL_COMMUNITY)
Admission: RE | Admit: 2018-07-23 | Discharge: 2018-07-23 | Disposition: A | Discharge status: Home / Self Care | Payer: Medicare HMO | Source: Ambulatory Visit | Attending: Internal Medicine | Admitting: Internal Medicine

## 2018-07-23 ENCOUNTER — Other Ambulatory Visit: Payer: Self-pay

## 2018-07-23 ENCOUNTER — Other Ambulatory Visit: Payer: Self-pay | Admitting: Internal Medicine

## 2018-07-23 DIAGNOSIS — K7689 Other specified diseases of liver: Secondary | ICD-10-CM | POA: Diagnosis not present

## 2018-07-23 DIAGNOSIS — I10 Essential (primary) hypertension: Secondary | ICD-10-CM | POA: Diagnosis not present

## 2018-07-23 NOTE — Addendum Note (Signed)
Addended by: [name] on: 07/23/2018 08:48 AM   Modules accepted: Orders

## 2018-07-24 ENCOUNTER — Ambulatory Visit: Payer: Medicare HMO

## 2018-07-25 ENCOUNTER — Telehealth: Payer: Self-pay | Admitting: Internal Medicine

## 2018-07-25 LAB — CATECHOLAMINE+VMA, 24-HR URINE
Dopamine , 24H Ur: 170 ug/24 hr (ref 0–510)
Dopamine, Rand Ur: 170 ug/L
EPINEPHRINE RAND UR: 6 ug/L
Epinephrine, 24H Ur: 6 ug/24 hr (ref 0–20)
Norepinephrine, 24H Ur: 38 ug/24 hr (ref 0–135)
Norepinephrine, Rand Ur: 38 ug/L
VMA 24H UR ADULT: 1.2 mg/(24.h) (ref 0.0–7.5)
VMA, Urine: 1.2 mg/L

## 2018-07-25 NOTE — Telephone Encounter (Signed)
I tried to reach out to to Dan Henry regarding his Lab and U/S results but was unable to reach him. I did leave a VM  Overal, Urine catecholamine + VMA were unremarkable, Aldosterone/Renine ratio was also unremarkable.   His U/S abdomen did show a 1.2 cm liver lesion and MRI Liver protocol was suggested. I have placed an order.

## 2018-07-25 NOTE — Addendum Note (Signed)
Addended by: [name] on: 07/25/2018 02:00 PM   Modules accepted: Orders

## 2018-08-02 ENCOUNTER — Other Ambulatory Visit: Payer: Self-pay | Admitting: *Deleted

## 2018-08-02 DIAGNOSIS — I10 Essential (primary) hypertension: Secondary | ICD-10-CM

## 2018-08-03 MED ORDER — LISINOPRIL 10 MG PO TABS: 10.0000 mg | ORAL_TABLET | Freq: Every day | ORAL | 1 refills | Status: DC

## 2018-08-20 ENCOUNTER — Telehealth: Payer: Self-pay

## 2018-08-20 NOTE — Telephone Encounter (Signed)
Hi Lauren,  Thanks for the update. I will speak to the sister.

## 2018-08-20 NOTE — Telephone Encounter (Signed)
Returned call to patient's niece, Dannell Gortney. States she came with patient to his last OV with PCP and was wanting to know the results of the urine tests. Note from PCP 07/25/2018 read to niece. She doesn't think patient knows  how to retrieve VM messages and requests that PCP communicate with her [phone]). States she's not sure if patient is taking all his meds correctly, especially metoprolol, as he tells her all his meds are once daily. Not sure if he's taking atorvastatin either. She also thinks he is continuing to drink alcohol. Requesting call from PCP to discuss next steps. Hubbard Hartshorn, RN, BSN

## 2018-08-20 NOTE — Telephone Encounter (Signed)
Requesting to speak with a nurse about meds. Please call pt back.  

## 2018-08-21 ENCOUNTER — Other Ambulatory Visit: Payer: Self-pay | Admitting: Internal Medicine

## 2018-08-21 DIAGNOSIS — I10 Essential (primary) hypertension: Secondary | ICD-10-CM

## 2018-08-24 ENCOUNTER — Telehealth: Payer: Self-pay

## 2018-08-24 NOTE — Telephone Encounter (Signed)
Per note below-information received by [place] [phone])  RE: Daniel  Received: Today  Message Contents  Carmelina Peal, [place], [place]        Got it! Thanks Laverne Hursey! Let me know if I can help you with anything else.   Janae Sauce   Previous Messages    ----- Message -----  From: Judge Stall, [place]  Sent: 08/24/2018 10:58 AM EDT  To: Janae Sauce  Subject: [place] Patient for [facility]

## 2018-08-24 NOTE — Telephone Encounter (Signed)
Pt's niece requesting to speak with Dr. Eileen Stanford. Please call back.

## 2018-08-24 NOTE — Telephone Encounter (Signed)
Thanks Hme

## 2018-08-28 DIAGNOSIS — I10 Essential (primary) hypertension: Secondary | ICD-10-CM | POA: Diagnosis not present

## 2018-08-30 ENCOUNTER — Telehealth: Payer: Self-pay | Admitting: Internal Medicine

## 2018-08-30 NOTE — Telephone Encounter (Signed)
Thanks  Glenda.   I'm glad this has been arranged.

## 2018-08-30 NOTE — Telephone Encounter (Signed)
Needs VO order; pls call Starla [phone]

## 2018-08-30 NOTE — Telephone Encounter (Signed)
I had already called and talked to [facility] . I called her - stated someone else must had called/ she does not need any orders/ to disregard.

## 2018-08-30 NOTE — Telephone Encounter (Signed)
I agree

## 2018-08-30 NOTE — Telephone Encounter (Signed)
Needs VO per Lavella Hammock [phone]

## 2018-08-30 NOTE — Telephone Encounter (Signed)
Return call to [place] RN at [facility] - requesting verbal order to continue seeing pt about his HTN for " twice a week x 2 weeks; 1 time a week x 2 weeks the every other week".  VO given - if not appropriate let me know. She also stated pt has high sodium intake and he drinks beer which he add salt. Thanks

## 2018-08-31 DIAGNOSIS — I10 Essential (primary) hypertension: Secondary | ICD-10-CM | POA: Diagnosis not present

## 2018-09-03 ENCOUNTER — Telehealth: Payer: Self-pay | Admitting: Internal Medicine

## 2018-09-03 DIAGNOSIS — I10 Essential (primary) hypertension: Secondary | ICD-10-CM | POA: Diagnosis not present

## 2018-09-03 NOTE — Telephone Encounter (Signed)
PT with [facility] calling to report high blood pressure.

## 2018-09-03 NOTE — Telephone Encounter (Signed)
[facility] PT calls from pt home, first BP 160/100 waited appr 15-20 min 180/100. She was there for [facility] PT eval and treat, she states he is doing well and this will be her only visit with pt. Pt denies chest apin, short of breath, n&v, vision changes, gait problems, weakness, h/a, states he did have a slight h/a yesterday but resolved and feels generally well today. Please advise

## 2018-09-03 NOTE — Telephone Encounter (Signed)
Attempted to call pt for appt scheduling, no answer

## 2018-09-03 NOTE — Telephone Encounter (Signed)
Please have him follow up for a tele visit

## 2018-09-04 DIAGNOSIS — I10 Essential (primary) hypertension: Secondary | ICD-10-CM | POA: Diagnosis not present

## 2018-09-04 NOTE — Telephone Encounter (Signed)
Hi Helen and Dr. Dareen Piano,   I had an extensive conversation with Mr. Alter niece a couple weeks ago who made me aware that he is non-compliant with his BP meds. Also during my last visit, I was concerned about EtOH use which could be contributing to his uncontrolled HTN. I have had an extensive work up to rule out secondary causes but they have all been negative.   Thanks

## 2018-09-05 ENCOUNTER — Ambulatory Visit (HOSPITAL_COMMUNITY): Admission: RE | Admit: 2018-09-05 | Payer: Medicare HMO | Source: Ambulatory Visit

## 2018-09-05 DIAGNOSIS — I10 Essential (primary) hypertension: Secondary | ICD-10-CM | POA: Diagnosis not present

## 2018-09-06 ENCOUNTER — Telehealth: Payer: Self-pay | Admitting: Internal Medicine

## 2018-09-06 ENCOUNTER — Ambulatory Visit (HOSPITAL_COMMUNITY): Payer: Medicare HMO

## 2018-09-06 NOTE — Telephone Encounter (Signed)
Dan Henry from [facility] needs VO [phone]

## 2018-09-06 NOTE — Telephone Encounter (Signed)
vo [facility] for community services, VO given, do you agree?

## 2018-09-06 NOTE — Telephone Encounter (Signed)
OK with me DrG

## 2018-09-07 ENCOUNTER — Ambulatory Visit (HOSPITAL_COMMUNITY): Admission: RE | Admit: 2018-09-07 | Payer: Medicare HMO | Source: Ambulatory Visit

## 2018-09-26 ENCOUNTER — Telehealth: Payer: Self-pay | Admitting: *Deleted

## 2018-09-26 NOTE — Telephone Encounter (Signed)
Pt states he has for about 1 month been having episodes about 2-3 times a week of tremors on one side of body and hallucinations, these episodes last for about 1 hour and then he feels fine. No tiredness, weakness, sleepiness afterwards. Denies illegal substances. Denies loss of bladder/ bowel.  He is advised to have someone take him to urg care or ED when and if they happen again. Given appt 6/4 dr Eileen Stanford

## 2018-10-02 DIAGNOSIS — I10 Essential (primary) hypertension: Secondary | ICD-10-CM | POA: Diagnosis not present

## 2018-10-04 ENCOUNTER — Encounter: Payer: Self-pay | Admitting: Internal Medicine

## 2018-10-04 ENCOUNTER — Ambulatory Visit (INDEPENDENT_AMBULATORY_CARE_PROVIDER_SITE_OTHER): Payer: Medicare HMO | Admitting: Internal Medicine

## 2018-10-04 ENCOUNTER — Ambulatory Visit (HOSPITAL_COMMUNITY)
Admission: RE | Admit: 2018-10-04 | Discharge: 2018-10-04 | Disposition: A | Discharge status: Home / Self Care | Payer: Medicare HMO | Source: Ambulatory Visit | Attending: Internal Medicine | Admitting: Internal Medicine

## 2018-10-04 ENCOUNTER — Other Ambulatory Visit: Payer: Self-pay | Admitting: Internal Medicine

## 2018-10-04 ENCOUNTER — Other Ambulatory Visit: Payer: Self-pay

## 2018-10-04 VITALS — BP 195/101 | HR 98 | Temp 98.3°F | Ht 64.0 in | Wt 125.5 lb

## 2018-10-04 DIAGNOSIS — Z9114 Patient's other noncompliance with medication regimen: Secondary | ICD-10-CM | POA: Diagnosis not present

## 2018-10-04 DIAGNOSIS — Z7289 Other problems related to lifestyle: Secondary | ICD-10-CM | POA: Diagnosis not present

## 2018-10-04 DIAGNOSIS — Z0000 Encounter for general adult medical examination without abnormal findings: Secondary | ICD-10-CM

## 2018-10-04 DIAGNOSIS — Z79899 Other long term (current) drug therapy: Secondary | ICD-10-CM | POA: Diagnosis not present

## 2018-10-04 DIAGNOSIS — I10 Essential (primary) hypertension: Secondary | ICD-10-CM | POA: Diagnosis not present

## 2018-10-04 MED ORDER — SPIRONOLACTONE 25 MG PO TABS: 12.5000 mg | ORAL_TABLET | Freq: Every day | ORAL | 3 refills | Status: DC

## 2018-10-04 NOTE — Assessment & Plan Note (Addendum)
#  Resistant hypertension: Is been very difficult since to manage [name] blood pressure despite maximal effort.  He is on multiple blood pressure medications including metoprolol 50 mg twice daily, lisinopril 10 mg daily, chlorthalidone 50 mg daily, amlodipine 10 mg daily.  We have ruled out pheochromocytoma, hyperaldosteronism.  He tells me today that he is inconsistent with taking his antihypertensives and this has been a problem in Dan past.  I worked to get him home health assistance to monitor his blood pressure and make sure he was compliant with medications.  His niece [name]) was present at Dan visit today and she is unsure if her uncle takes his medications.  Dan last blood pressure reading by Dan home health aide was 160s/100s.  He remains asymptomatic and denies headaches, dizziness, lightheadedness, chest pain, nausea, vomiting, diaphoresis, weakness.  He did tell me that in Dan past week he had several episodes of nonexertional, intermittent chest pain which would last for only seconds without any other symptoms including nausea, vomiting, diaphoresis or radiation.  Dan reason for his persistent and resistant hypertension are multifactorial including medication nonadherence, high sodium diet, alcohol use.  He is a very nice gentleman.  We have ruled out pheochromocytoma, hyperaldosteronism and will pursue renal ultrasound with Doppler to rule out renal artery stenosis.  We will obtain an EKG at Dan clinic today and it was unremarkable.  BP Readings from Last 3 Encounters:  10/04/18 (!) 195/101  07/10/18 (!) 153/87  06/26/18 (!) 145/84     Plan: - Obtain renal ultrasound with Doppler - Advised on low-sodium diet.  Keep sodium less than 2 g a day - Advised on DASH diet - Advised to abstain from alcohol - Continue metoprolol 50 mg BID - Continue lisinopril 10 mg daily - Continue chlorthalidone 50 mg daily - Continue amlodipine 10 mg daily - ADD spironolactone 12.5 mg daily -  RTC in 2 weeks ------- We will discuss combination BP meds (Lisinopril-HCTZ, Amlodipine-Atorvastatin) in 2 weeks

## 2018-10-04 NOTE — Addendum Note (Signed)
Addended by: [name] on: 10/04/2018 04:28 PM   Modules accepted: Orders

## 2018-10-04 NOTE — Progress Notes (Signed)
   CC: Follow up resistant hypertension  HPI:  Mr.Kaisyn Shirah is a 68 y.o. with resistant hypertension here for follow-up.  Please see problem based charting for further details.    Past Medical History:  Diagnosis Date  . Hypertension    Review of Systems:  As per HPI  Physical Exam:  Vitals:   10/04/18 1538  BP: (!) 196/96  Pulse: 98  Temp: 98.3 F (36.8 C)  TempSrc: Oral  SpO2: 98%  Weight: 125 lb 8 oz (56.9 kg)   Physical Exam Vitals signs reviewed.  HENT:     Head: Normocephalic and atraumatic.  Cardiovascular:     Rate and Rhythm: Normal rate.     Heart sounds: No murmur.  Pulmonary:     Breath sounds: Normal breath sounds. No wheezing or rales.  Abdominal:     General: Bowel sounds are normal.     Tenderness: There is no abdominal tenderness.  Musculoskeletal:        General: No swelling or deformity.     Right lower leg: No edema.     Left lower leg: No edema.  Skin:    General: Skin is warm.  Neurological:     General: No focal deficit present.     Mental Status: He is alert. Mental status is at baseline.  Psychiatric:        Mood and Affect: Mood normal.     Assessment & Plan:   See Encounters Tab for problem based charting.  Patient discussed with Dr. Angelia Mould

## 2018-10-04 NOTE — Assessment & Plan Note (Signed)
#  EtOH use disorder: He tells me today that he drinks occasionally, and a couple beers when needed.  I obtained a tox assure couple months and was positive for EtOH.  I believe this could be contributing to his resistant hypertension.  He tells me there are times where he would have bilateral hand tremors.  Plan: - Advised regarding reducing alcohol intake/titrating down and eventually discontinuing.

## 2018-10-04 NOTE — Patient Instructions (Signed)
[name],   It was a pleasure taking care of you here in the clinic today.  Like we discussed in the room, is been quite challenging controlling your blood pressure and here my recommendations after today's visit.  1.  Please continue taking ALL your blood pressure medications -Metoprolol 500 mg twice daily -Lisinopril 10 mg daily -Chlorthalidone 50 mg daily -amlodipine 10 mg daily  2.  I am adding another blood pressure medication called spironolactone.  You take this medication 1 pill once a day.  3.  I want you to return to the clinic in 2 weeks for blood pressure check.  4. PLEASE DECREASE your salt intake.  5.  Ordering an ultrasound of your kidneys, you should get a call to have this done.  Take Care! Dr. Eileen Stanford!

## 2018-10-05 ENCOUNTER — Telehealth: Payer: Self-pay | Admitting: Internal Medicine

## 2018-10-05 NOTE — Telephone Encounter (Signed)
[name] is requesting a nurse to call back; [phone]

## 2018-10-05 NOTE — Telephone Encounter (Signed)
Thanks Lauren,  I will follow up on this.

## 2018-10-05 NOTE — Telephone Encounter (Signed)
No worries. But I've spoken to [place].

## 2018-10-05 NOTE — Progress Notes (Signed)
Internal Medicine Clinic Attending  Case discussed with Dr. Agyei at the time of the visit.  We reviewed the resident's history and exam and pertinent patient test results.  I agree with the assessment, diagnosis, and plan of care documented in the resident's note.    

## 2018-10-05 NOTE — Telephone Encounter (Addendum)
Returned call to [place]. States she had to leave prior to end of appt yesterday and was expecting call from PCP. [name] made aware of PCP's plan to include adding spironolactone, decrease salt to < 2g/day, decrease alcohol consumption and eventually stop and renal U/S. [name] states she has scheduled MRI of abdomen for 10/11/2018 and would like to have renal U/S done at same time. Patient's niece would still like a call back from PCP. Hubbard Hartshorn, RN, BSN

## 2018-10-10 ENCOUNTER — Telehealth: Payer: Self-pay | Admitting: Internal Medicine

## 2018-10-10 NOTE — Telephone Encounter (Signed)
Pt's MRI & Ultrasound Authorization was re-submitted to [facility]. Pt's original Authorization Approval was for 04/24//2020-09/23/2018 but it  has expired due to patient's 3 missed appts with Radiology. Waiting for a new Authorization Approval from [facility].

## 2018-10-11 ENCOUNTER — Ambulatory Visit (HOSPITAL_COMMUNITY): Payer: Medicare HMO

## 2018-10-17 DIAGNOSIS — I10 Essential (primary) hypertension: Secondary | ICD-10-CM | POA: Diagnosis not present

## 2018-10-18 ENCOUNTER — Telehealth: Payer: Self-pay | Admitting: Internal Medicine

## 2018-10-18 ENCOUNTER — Ambulatory Visit (HOSPITAL_COMMUNITY): Payer: Medicare HMO

## 2018-10-18 ENCOUNTER — Other Ambulatory Visit: Payer: Self-pay

## 2018-10-18 ENCOUNTER — Ambulatory Visit (HOSPITAL_COMMUNITY)
Admission: RE | Admit: 2018-10-18 | Discharge: 2018-10-18 | Disposition: A | Discharge status: Home / Self Care | Payer: Medicare HMO | Source: Ambulatory Visit | Attending: Internal Medicine | Admitting: Internal Medicine

## 2018-10-18 ENCOUNTER — Ambulatory Visit: Payer: Medicare HMO

## 2018-10-18 DIAGNOSIS — D1809 Hemangioma of other sites: Secondary | ICD-10-CM | POA: Diagnosis not present

## 2018-10-18 DIAGNOSIS — K769 Liver disease, unspecified: Secondary | ICD-10-CM | POA: Diagnosis not present

## 2018-10-18 DIAGNOSIS — K760 Fatty (change of) liver, not elsewhere classified: Secondary | ICD-10-CM | POA: Diagnosis not present

## 2018-10-18 DIAGNOSIS — I10 Essential (primary) hypertension: Secondary | ICD-10-CM

## 2018-10-18 DIAGNOSIS — K7689 Other specified diseases of liver: Secondary | ICD-10-CM | POA: Diagnosis not present

## 2018-10-18 LAB — CREATININE, SERUM
Creatinine, Ser: 1.46 mg/dL — ABNORMAL HIGH (ref 0.61–1.24)
GFR calc Af Amer: 56 mL/min — ABNORMAL LOW (ref >60)
GFR calc non Af Amer: 49 mL/min — ABNORMAL LOW (ref >60)

## 2018-10-18 MED ORDER — GADOBUTROL 1 MMOL/ML IV SOLN
6.0000 mL | Freq: Once | INTRAVENOUS | Status: AC | PRN
  Administered 2018-10-18: 6 mL via INTRAVENOUS

## 2018-10-18 NOTE — Telephone Encounter (Signed)
Has appt next week

## 2018-10-18 NOTE — Telephone Encounter (Signed)
Received TC from [place], [place] with [facility].  She states pt's b/p has remained "consistently high and was 192/108 yesterday".  Stanton Kidney states pt has not taken his b/p med X 2 days and never filled his aldactone.  Home Care RN states pt lives alone, but has a niece who is involved and might be able to provide assistance in medication compliance and home-care  RN will contact niece today.  Per Stanton Kidney, RN w/ [facility] pt is cognitively intact. This RN asked what pt's b/p was today, [facility] RN states she will have nurse [name] go out and check b/p.  Stanton Kidney also states she will get in touch with [facility] pharmacy to get medications for patient. Pt had an appt in [facility] today, but appt shows as cancelled per pt and rescheduled to 10/23/18. Will forward information regarding pt's non-compliance with b/p management medications to PCP. SChaplin, RN,BSN

## 2018-10-18 NOTE — Telephone Encounter (Signed)
Changed to a vas [place] order.  Please let me know if not the correct order.   Thank you!  Gilles Chiquito, MD

## 2018-10-18 NOTE — Telephone Encounter (Signed)
Rec'd Call from Pt's Caregiver [name]), [place] Appt was cancelled with [facility] Radiology this morning. Radiology scheduled appt in error per Caregiver and states it needs to be done in the Vascular Lab instead.   Spoke with Butch Penny in [employer] and confirmed current order is incorrect and need to be changed to a Vascular Renal Study instead and they will schedule the patient as soon as possible. Please advise if this change can be made.

## 2018-10-19 ENCOUNTER — Telehealth: Payer: Self-pay | Admitting: Internal Medicine

## 2018-10-19 ENCOUNTER — Other Ambulatory Visit: Payer: Self-pay | Admitting: Internal Medicine

## 2018-10-19 DIAGNOSIS — D1803 Hemangioma of intra-abdominal structures: Secondary | ICD-10-CM | POA: Insufficient documentation

## 2018-10-19 NOTE — Telephone Encounter (Signed)
Thank you :)

## 2018-10-19 NOTE — Telephone Encounter (Signed)
I attempted to call Dan Henry to discuss MRI abdomen but was unable to reach him. MRI shows 1.0 by 0.9 by 0.6 cm lesion hemangioma at the right hepatic lobe. It lesion is <5cm and no further imaging is required.

## 2018-10-19 NOTE — Telephone Encounter (Signed)
Order has been sch for 10/22/2018.  Pt's Caregiver cam in this morning and pick up the appt with Instructions for this procedure.

## 2018-10-22 ENCOUNTER — Telehealth: Payer: Self-pay

## 2018-10-22 ENCOUNTER — Ambulatory Visit (HOSPITAL_COMMUNITY): Payer: Medicare HMO

## 2018-10-22 NOTE — Telephone Encounter (Signed)
RTC to pt's niece, [name].  She states pt had his MRI last week, but did not go in for his u/s which was scheduled today.  Per his niece, pt told her he was having the u/s, but didn't show up.  Niece c/o of decreasing cognitive decline with periods of  Sluggishness.  Niece also states pt is still non-compliant with taking his b/p meds.  Pt has a f/u appt tomorrow in [facility] at 1:15, instructed niece to keep this appt, she verbalized understanding and states she will attend appt.  She is concerned about pt living alone, would like to discuss options, pt already has [place] on board. Informed niece if pt has any altered LOC or difficulty arousing, call 911 to have pt sent to ED, she verbalized understanding and states pt is alert and wanting to eat at present time. Will forward to pcp. SChaplin, RN,BSN

## 2018-10-22 NOTE — Telephone Encounter (Signed)
Pt's niece requesting to speak with a nurse. Please call back.

## 2018-10-23 ENCOUNTER — Inpatient Hospital Stay (HOSPITAL_COMMUNITY)
Admission: EM | Admit: 2018-10-23 | Discharge: 2018-10-28 | DRG: 897 | Disposition: A | Discharge status: Home Health | Payer: Medicare HMO | Attending: Student in an Organized Health Care Education/Training Program | Admitting: Student in an Organized Health Care Education/Training Program

## 2018-10-23 ENCOUNTER — Encounter (HOSPITAL_COMMUNITY): Payer: Self-pay | Admitting: Emergency Medicine

## 2018-10-23 ENCOUNTER — Ambulatory Visit: Payer: Medicare HMO

## 2018-10-23 ENCOUNTER — Other Ambulatory Visit: Payer: Self-pay

## 2018-10-23 DIAGNOSIS — K7010 Alcoholic hepatitis without ascites: Secondary | ICD-10-CM

## 2018-10-23 DIAGNOSIS — Z781 Physical restraint status: Secondary | ICD-10-CM

## 2018-10-23 DIAGNOSIS — E440 Moderate protein-calorie malnutrition: Secondary | ICD-10-CM | POA: Insufficient documentation

## 2018-10-23 DIAGNOSIS — K760 Fatty (change of) liver, not elsewhere classified: Secondary | ICD-10-CM | POA: Diagnosis present

## 2018-10-23 DIAGNOSIS — Z1159 Encounter for screening for other viral diseases: Secondary | ICD-10-CM

## 2018-10-23 DIAGNOSIS — Z79899 Other long term (current) drug therapy: Secondary | ICD-10-CM | POA: Diagnosis not present

## 2018-10-23 DIAGNOSIS — F10231 Alcohol dependence with withdrawal delirium: Principal | ICD-10-CM | POA: Diagnosis present

## 2018-10-23 DIAGNOSIS — Z6820 Body mass index (BMI) 20.0-20.9, adult: Secondary | ICD-10-CM

## 2018-10-23 DIAGNOSIS — M6282 Rhabdomyolysis: Secondary | ICD-10-CM

## 2018-10-23 DIAGNOSIS — F1020 Alcohol dependence, uncomplicated: Secondary | ICD-10-CM | POA: Diagnosis present

## 2018-10-23 DIAGNOSIS — R330 Drug induced retention of urine: Secondary | ICD-10-CM | POA: Diagnosis not present

## 2018-10-23 DIAGNOSIS — Z8249 Family history of ischemic heart disease and other diseases of the circulatory system: Secondary | ICD-10-CM

## 2018-10-23 DIAGNOSIS — R338 Other retention of urine: Secondary | ICD-10-CM | POA: Diagnosis not present

## 2018-10-23 DIAGNOSIS — G250 Essential tremor: Secondary | ICD-10-CM | POA: Diagnosis present

## 2018-10-23 DIAGNOSIS — Y92230 Patient room in hospital as the place of occurrence of the external cause: Secondary | ICD-10-CM | POA: Diagnosis present

## 2018-10-23 DIAGNOSIS — D649 Anemia, unspecified: Secondary | ICD-10-CM | POA: Diagnosis present

## 2018-10-23 DIAGNOSIS — R821 Myoglobinuria: Secondary | ICD-10-CM | POA: Diagnosis present

## 2018-10-23 DIAGNOSIS — F10930 Alcohol use, unspecified with withdrawal, uncomplicated: Secondary | ICD-10-CM

## 2018-10-23 DIAGNOSIS — F1021 Alcohol dependence, in remission: Secondary | ICD-10-CM | POA: Diagnosis present

## 2018-10-23 DIAGNOSIS — F10230 Alcohol dependence with withdrawal, uncomplicated: Secondary | ICD-10-CM

## 2018-10-23 DIAGNOSIS — N401 Enlarged prostate with lower urinary tract symptoms: Secondary | ICD-10-CM | POA: Diagnosis not present

## 2018-10-23 DIAGNOSIS — R339 Retention of urine, unspecified: Secondary | ICD-10-CM | POA: Diagnosis not present

## 2018-10-23 DIAGNOSIS — E785 Hyperlipidemia, unspecified: Secondary | ICD-10-CM | POA: Diagnosis present

## 2018-10-23 DIAGNOSIS — E876 Hypokalemia: Secondary | ICD-10-CM | POA: Diagnosis not present

## 2018-10-23 DIAGNOSIS — Z823 Family history of stroke: Secondary | ICD-10-CM | POA: Diagnosis not present

## 2018-10-23 DIAGNOSIS — K7011 Alcoholic hepatitis with ascites: Secondary | ICD-10-CM | POA: Diagnosis not present

## 2018-10-23 DIAGNOSIS — Z960 Presence of urogenital implants: Secondary | ICD-10-CM | POA: Diagnosis not present

## 2018-10-23 DIAGNOSIS — Z9114 Patient's other noncompliance with medication regimen: Secondary | ICD-10-CM

## 2018-10-23 DIAGNOSIS — R441 Visual hallucinations: Secondary | ICD-10-CM | POA: Diagnosis present

## 2018-10-23 DIAGNOSIS — T424X5A Adverse effect of benzodiazepines, initial encounter: Secondary | ICD-10-CM | POA: Diagnosis not present

## 2018-10-23 DIAGNOSIS — I10 Essential (primary) hypertension: Secondary | ICD-10-CM | POA: Diagnosis not present

## 2018-10-23 DIAGNOSIS — I160 Hypertensive urgency: Secondary | ICD-10-CM | POA: Diagnosis present

## 2018-10-23 DIAGNOSIS — Z91148 Patient's other noncompliance with medication regimen for other reason: Secondary | ICD-10-CM

## 2018-10-23 LAB — AMMONIA: Ammonia: 43 umol/L — ABNORMAL HIGH (ref 9–35)

## 2018-10-23 LAB — CBC WITH DIFFERENTIAL/PLATELET
Abs Immature Granulocytes: 0.03 10*3/uL (ref 0.00–0.07)
Basophils Absolute: 0.1 10*3/uL (ref 0.0–0.1)
Basophils Relative: 2 %
Eosinophils Absolute: 0 10*3/uL (ref 0.0–0.5)
Eosinophils Relative: 1 %
HCT: 36.1 % — ABNORMAL LOW (ref 39.0–52.0)
Hemoglobin: 11.9 g/dL — ABNORMAL LOW (ref 13.0–17.0)
Immature Granulocytes: 1 %
Lymphocytes Relative: 14 %
Lymphs Abs: 0.8 10*3/uL (ref 0.7–4.0)
MCH: 30.7 pg (ref 26.0–34.0)
MCHC: 33 g/dL (ref 30.0–36.0)
MCV: 93 fL (ref 80.0–100.0)
Monocytes Absolute: 0.5 10*3/uL (ref 0.1–1.0)
Monocytes Relative: 10 %
Neutro Abs: 4 10*3/uL (ref 1.7–7.7)
Neutrophils Relative %: 72 %
Platelets: 216 10*3/uL (ref 150–400)
RBC: 3.88 MIL/uL — ABNORMAL LOW (ref 4.22–5.81)
RDW: 16.4 % — ABNORMAL HIGH (ref 11.5–15.5)
WBC: 5.5 10*3/uL (ref 4.0–10.5)
nRBC: 0 % (ref 0.0–0.2)

## 2018-10-23 LAB — BASIC METABOLIC PANEL
Anion gap: 15 (ref 5–15)
BUN: 13 mg/dL (ref 8–23)
CO2: 23 mmol/L (ref 22–32)
Calcium: 9.5 mg/dL (ref 8.9–10.3)
Chloride: 106 mmol/L (ref 98–111)
Creatinine, Ser: 1.13 mg/dL (ref 0.61–1.24)
GFR calc Af Amer: >60 mL/min (ref >60)
GFR calc non Af Amer: >60 mL/min (ref >60)
Glucose, Bld: 130 mg/dL — ABNORMAL HIGH (ref 70–99)
Potassium: 3.3 mmol/L — ABNORMAL LOW (ref 3.5–5.1)
Sodium: 144 mmol/L (ref 135–145)

## 2018-10-23 LAB — RAPID URINE DRUG SCREEN, HOSP PERFORMED
Amphetamines: NOT DETECTED
Barbiturates: NOT DETECTED
Benzodiazepines: NOT DETECTED
Cocaine: NOT DETECTED
Opiates: NOT DETECTED
Tetrahydrocannabinol: NOT DETECTED

## 2018-10-23 LAB — URINALYSIS, ROUTINE W REFLEX MICROSCOPIC
Bacteria, UA: NONE SEEN
Bilirubin Urine: NEGATIVE
Glucose, UA: NEGATIVE mg/dL
Ketones, ur: NEGATIVE mg/dL
Leukocytes,Ua: NEGATIVE
Nitrite: NEGATIVE
Protein, ur: 100 mg/dL — AB
Specific Gravity, Urine: 1.013 (ref 1.005–1.030)
pH: 7 (ref 5.0–8.0)

## 2018-10-23 LAB — HEPATIC FUNCTION PANEL
ALT: 82 U/L — ABNORMAL HIGH (ref 0–44)
AST: 118 U/L — ABNORMAL HIGH (ref 15–41)
Albumin: 4.8 g/dL (ref 3.5–5.0)
Alkaline Phosphatase: 65 U/L (ref 38–126)
Bilirubin, Direct: <0.1 mg/dL (ref 0.0–0.2)
Total Bilirubin: 0.5 mg/dL (ref 0.3–1.2)
Total Protein: 8.5 g/dL — ABNORMAL HIGH (ref 6.5–8.1)

## 2018-10-23 LAB — PHOSPHORUS: Phosphorus: 4.1 mg/dL (ref 2.5–4.6)

## 2018-10-23 LAB — MAGNESIUM: Magnesium: 1.9 mg/dL (ref 1.7–2.4)

## 2018-10-23 LAB — ETHANOL: Alcohol, Ethyl (B): 105 mg/dL — ABNORMAL HIGH (ref <10)

## 2018-10-23 MED ORDER — LORAZEPAM 1 MG PO TABS
1.0000 mg | ORAL_TABLET | ORAL | Status: DC | PRN
  Administered 2018-10-24: 12:00:00 1 mg via ORAL
  Filled 2018-10-23: qty 1

## 2018-10-23 MED ORDER — CHLORDIAZEPOXIDE HCL 25 MG PO CAPS: 25.0000 mg | ORAL_CAPSULE | ORAL | Status: DC

## 2018-10-23 MED ORDER — METOPROLOL TARTRATE 50 MG PO TABS
50.0000 mg | ORAL_TABLET | Freq: Two times a day (BID) | ORAL | Status: DC
  Administered 2018-10-23 – 2018-10-28 (×10): 50 mg via ORAL
  Filled 2018-10-23 (×10): qty 1

## 2018-10-23 MED ORDER — LISINOPRIL 10 MG PO TABS
10.0000 mg | ORAL_TABLET | Freq: Every day | ORAL | Status: DC
  Administered 2018-10-23 – 2018-10-28 (×6): 10 mg via ORAL
  Filled 2018-10-23 (×6): qty 1

## 2018-10-23 MED ORDER — CHLORDIAZEPOXIDE HCL 25 MG PO CAPS
25.0000 mg | ORAL_CAPSULE | Freq: Four times a day (QID) | ORAL | Status: DC
  Administered 2018-10-23 – 2018-10-24 (×3): 25 mg via ORAL
  Filled 2018-10-23 (×4): qty 1

## 2018-10-23 MED ORDER — AMLODIPINE BESYLATE 5 MG PO TABS
5.0000 mg | ORAL_TABLET | Freq: Every day | ORAL | Status: DC
  Administered 2018-10-23 – 2018-10-24 (×2): 5 mg via ORAL
  Filled 2018-10-23 (×3): qty 1

## 2018-10-23 MED ORDER — VITAMIN B-1 100 MG PO TABS
100.0000 mg | ORAL_TABLET | Freq: Every day | ORAL | Status: DC
  Administered 2018-10-24 – 2018-10-28 (×5): 100 mg via ORAL
  Filled 2018-10-23 (×5): qty 1

## 2018-10-23 MED ORDER — THIAMINE HCL 100 MG/ML IJ SOLN: 100.0000 mg | Freq: Once | INTRAMUSCULAR | Status: DC

## 2018-10-23 MED ORDER — ENOXAPARIN SODIUM 40 MG/0.4ML ~~LOC~~ SOLN
40.0000 mg | SUBCUTANEOUS | Status: DC
  Administered 2018-10-24 – 2018-10-27 (×4): 40 mg via SUBCUTANEOUS
  Filled 2018-10-23 (×4): qty 0.4

## 2018-10-23 MED ORDER — CHLORDIAZEPOXIDE HCL 25 MG PO CAPS
25.0000 mg | ORAL_CAPSULE | Freq: Three times a day (TID) | ORAL | Status: DC
  Administered 2018-10-24: 25 mg via ORAL
  Filled 2018-10-23 (×2): qty 1

## 2018-10-23 MED ORDER — CHLORDIAZEPOXIDE HCL 25 MG PO CAPS
50.0000 mg | ORAL_CAPSULE | Freq: Once | ORAL | Status: AC
  Administered 2018-10-23: 20:00:00 50 mg via ORAL
  Filled 2018-10-23: qty 2

## 2018-10-23 MED ORDER — LORAZEPAM 2 MG/ML IJ SOLN: 1.0000 mg | Freq: Once | INTRAMUSCULAR | Status: DC

## 2018-10-23 MED ORDER — CHLORDIAZEPOXIDE HCL 25 MG PO CAPS: 25.0000 mg | ORAL_CAPSULE | Freq: Three times a day (TID) | ORAL | Status: DC

## 2018-10-23 MED ORDER — LORAZEPAM 2 MG/ML IJ SOLN
1.0000 mg | Freq: Once | INTRAMUSCULAR | Status: AC
  Administered 2018-10-23: 1 mg via INTRAVENOUS
  Filled 2018-10-23: qty 1

## 2018-10-23 MED ORDER — SODIUM CHLORIDE 0.9 % IV BOLUS
1000.0000 mL | Freq: Once | INTRAVENOUS | Status: AC
  Administered 2018-10-23: 1000 mL via INTRAVENOUS

## 2018-10-23 MED ORDER — CHLORDIAZEPOXIDE HCL 25 MG PO CAPS: 25.0000 mg | ORAL_CAPSULE | Freq: Every day | ORAL | Status: DC

## 2018-10-23 MED ORDER — ONDANSETRON 4 MG PO TBDP: 4.0000 mg | ORAL_TABLET | Freq: Four times a day (QID) | ORAL | Status: AC | PRN

## 2018-10-23 MED ORDER — FOLIC ACID 5 MG/ML IJ SOLN
1.0000 mg | Freq: Every day | INTRAMUSCULAR | Status: DC
  Administered 2018-10-23 – 2018-10-24 (×2): 1 mg via INTRAVENOUS
  Filled 2018-10-23 (×2): qty 0.2

## 2018-10-23 MED ORDER — LORAZEPAM 2 MG/ML IJ SOLN
1.0000 mg | INTRAMUSCULAR | Status: DC | PRN
  Administered 2018-10-23 – 2018-10-25 (×2): 1 mg via INTRAVENOUS
  Filled 2018-10-23 (×3): qty 1

## 2018-10-23 MED ORDER — LABETALOL HCL 5 MG/ML IV SOLN
20.0000 mg | INTRAVENOUS | Status: DC | PRN
  Administered 2018-10-23 (×2): 20 mg via INTRAVENOUS
  Filled 2018-10-23 (×2): qty 4

## 2018-10-23 MED ORDER — SPIRONOLACTONE 12.5 MG HALF TABLET: 12.5000 mg | ORAL_TABLET | Freq: Every day | ORAL | Status: DC

## 2018-10-23 MED ORDER — POTASSIUM CHLORIDE 2 MEQ/ML IV SOLN
INTRAVENOUS | Status: DC
  Administered 2018-10-23: 22:00:00 via INTRAVENOUS
  Filled 2018-10-23 (×2): qty 1000

## 2018-10-23 MED ORDER — ATORVASTATIN CALCIUM 40 MG PO TABS
40.0000 mg | ORAL_TABLET | Freq: Every day | ORAL | Status: DC
  Administered 2018-10-23 – 2018-10-28 (×6): 40 mg via ORAL
  Filled 2018-10-23 (×6): qty 1

## 2018-10-23 MED ORDER — ADULT MULTIVITAMIN W/MINERALS CH
1.0000 | ORAL_TABLET | Freq: Every day | ORAL | Status: DC
  Administered 2018-10-24 – 2018-10-28 (×5): 1 via ORAL
  Filled 2018-10-23 (×5): qty 1

## 2018-10-23 MED ORDER — HYDROXYZINE HCL 25 MG PO TABS: 25.0000 mg | ORAL_TABLET | Freq: Four times a day (QID) | ORAL | Status: DC | PRN

## 2018-10-23 MED ORDER — CHLORDIAZEPOXIDE HCL 25 MG PO CAPS: 25.0000 mg | ORAL_CAPSULE | Freq: Four times a day (QID) | ORAL | Status: DC

## 2018-10-23 MED ORDER — KCL-LACTATED RINGERS 20 MEQ/L IV SOLN
INTRAVENOUS | Status: DC
  Filled 2018-10-23: qty 1000

## 2018-10-23 MED ORDER — AMLODIPINE BESYLATE 5 MG PO TABS: 10.0000 mg | ORAL_TABLET | Freq: Every day | ORAL | Status: DC

## 2018-10-23 MED ORDER — SODIUM CHLORIDE 0.9 % IV BOLUS
500.0000 mL | Freq: Once | INTRAVENOUS | Status: AC
  Administered 2018-10-23: 12:00:00 500 mL via INTRAVENOUS

## 2018-10-23 MED ORDER — LOPERAMIDE HCL 2 MG PO CAPS: 2.0000 mg | ORAL_CAPSULE | ORAL | Status: AC | PRN

## 2018-10-23 MED ORDER — THIAMINE HCL 100 MG/ML IJ SOLN
100.0000 mg | Freq: Once | INTRAMUSCULAR | Status: AC
  Administered 2018-10-23: 14:00:00 100 mg via INTRAVENOUS
  Filled 2018-10-23: qty 2

## 2018-10-23 MED ORDER — LISINOPRIL 10 MG PO TABS: 10.0000 mg | ORAL_TABLET | Freq: Every day | ORAL | Status: DC

## 2018-10-23 NOTE — ED Notes (Signed)
Admitting MD in to assess pt for admission at this time 

## 2018-10-23 NOTE — ED Notes (Signed)
ED TO INPATIENT HANDOFF REPORT  ED Nurse Name and Phone #: [phone]  S Name/Age/Gender Dan Henry 68 y.o. male Room/Bed: 058C/058C  Code Status   Code Status: Full Code  Home/SNF/Other Home Patient oriented to: self Is this baseline? No   Triage Complete: Triage complete  Chief Complaint Weakness, HBP  Triage Note Pt. Stated, Dan Henry had some tremors and a headache that started this morning.   Allergies No Known Allergies  Level of Care/Admitting Diagnosis ED Disposition    ED Disposition Condition [facility] Area: [place] [[id number]]  Level of Care: Progressive [102]  Covid Evaluation: Screening Protocol (No Symptoms)  Diagnosis: Acute alcohol abuse, with delirium [facility]) [[id number]]  Admitting Physician: Axel Filler [phone]  Attending Physician: Axel Filler [[id number]]  Estimated length of stay: past midnight tomorrow  Certification:: I certify this patient will need inpatient services for at least 2 midnights  PT Class (Do Not Modify): Inpatient [101]  PT Acc Code (Do Not Modify): Private [1]       B Medical/Surgery History Past Medical History:  Diagnosis Date  . Hypertension    Past Surgical History:  Procedure Laterality Date  . NO PAST SURGERIES       A IV Location/Drains/Wounds Patient Lines/Drains/Airways Status   Active Line/Drains/Airways    None          Intake/Output Last 24 hours  Intake/Output Summary (Last 24 hours) at 10/23/2018 1933 Last data filed at 10/23/2018 1459 Gross per 24 hour  Intake 500 ml  Output -  Net 500 ml    Labs/Imaging Results for orders placed or performed during the hospital encounter of 10/23/18 (from the past 48 hour(s))  CBC with Differential     Status: Abnormal   Collection Time: 10/23/18 10:35 AM  Result Value Ref Range   WBC 5.5 4.0 - 10.5 K/uL   RBC 3.88 (L) 4.22 - 5.81 MIL/uL   Hemoglobin 11.9 (L) 13.0 - 17.0 g/dL   HCT 36.1 (L) 39.0 - 52.0  %   MCV 93.0 80.0 - 100.0 fL   MCH 30.7 26.0 - 34.0 pg   MCHC 33.0 30.0 - 36.0 g/dL   RDW 16.4 (H) 11.5 - 15.5 %   Platelets 216 150 - 400 K/uL   nRBC 0.0 0.0 - 0.2 %   Neutrophils Relative % 72 %   Neutro Abs 4.0 1.7 - 7.7 K/uL   Lymphocytes Relative 14 %   Lymphs Abs 0.8 0.7 - 4.0 K/uL   Monocytes Relative 10 %   Monocytes Absolute 0.5 0.1 - 1.0 K/uL   Eosinophils Relative 1 %   Eosinophils Absolute 0.0 0.0 - 0.5 K/uL   Basophils Relative 2 %   Basophils Absolute 0.1 0.0 - 0.1 K/uL   Immature Granulocytes 1 %   Abs Immature Granulocytes 0.03 0.00 - 0.07 K/uL    Comment: Performed at [facility], [address], [place],  [address]  Basic metabolic panel     Status: Abnormal   Collection Time: 10/23/18 10:35 AM  Result Value Ref Range   Sodium 144 135 - 145 mmol/L   Potassium 3.3 (L) 3.5 - 5.1 mmol/L   Chloride 106 98 - 111 mmol/L   CO2 23 22 - 32 mmol/L   Glucose, Bld 130 (H) 70 - 99 mg/dL   BUN 13 8 - 23 mg/dL   Creatinine, Ser 1.13 0.61 - 1.24 mg/dL   Calcium 9.5 8.9 - 10.3 mg/dL  GFR calc non Af Amer >60 >60 mL/min   GFR calc Af Amer >60 >60 mL/min   Anion gap 15 5 - 15    Comment: Performed at [facility] [address], [place], [place] [address]  Ethanol     Status: Abnormal   Collection Time: 10/23/18 11:50 AM  Result Value Ref Range   Alcohol, Ethyl (B) 105 (H) <10 mg/dL    Comment: (NOTE) Lowest detectable limit for serum alcohol is 10 mg/dL. For medical purposes only. Performed at [facility], [place] [address], [place], [place] [address]   Hepatic function panel     Status: Abnormal   Collection Time: 10/23/18 11:50 AM  Result Value Ref Range   Total Protein 8.5 (H) 6.5 - 8.1 g/dL   Albumin 4.8 3.5 - 5.0 g/dL   AST 118 (H) 15 - 41 U/L   ALT 82 (H) 0 - 44 U/L   Alkaline Phosphatase 65 38 - 126 U/L   Total Bilirubin 0.5 0.3 - 1.2 mg/dL   Bilirubin, Direct <0.1 0.0 - 0.2 mg/dL   Indirect Bilirubin NOT CALCULATED 0.3 - 0.9  mg/dL    Comment: Performed at [facility] [address], [place], [place] [address]  Ammonia     Status: Abnormal   Collection Time: 10/23/18 11:50 AM  Result Value Ref Range   Ammonia 43 (H) 9 - 35 umol/L    Comment: SLIGHT HEMOLYSIS Performed at [facility], [place] [address], [place], [place] [address]   Rapid urine drug screen (hospital performed)     Status: None   Collection Time: 10/23/18  1:05 PM  Result Value Ref Range   Opiates NONE DETECTED NONE DETECTED   Cocaine NONE DETECTED NONE DETECTED   Benzodiazepines NONE DETECTED NONE DETECTED   Amphetamines NONE DETECTED NONE DETECTED   Tetrahydrocannabinol NONE DETECTED NONE DETECTED   Barbiturates NONE DETECTED NONE DETECTED    Comment: (NOTE) DRUG SCREEN FOR MEDICAL PURPOSES ONLY.  IF CONFIRMATION IS NEEDED FOR ANY PURPOSE, NOTIFY LAB WITHIN 5 DAYS. LOWEST DETECTABLE LIMITS FOR URINE DRUG SCREEN Drug Class                     Cutoff (ng/mL) Amphetamine and metabolites    1000 Barbiturate and metabolites    200 Benzodiazepine                 683 Tricyclics and metabolites     300 Opiates and metabolites        300 Cocaine and metabolites        300 THC                            50 Performed at [facility], [place] [address] [place], [place] [address]   Urinalysis, Routine w reflex microscopic     Status: Abnormal   Collection Time: 10/23/18  1:05 PM  Result Value Ref Range   Color, Urine STRAW (A) YELLOW   APPearance CLEAR CLEAR   Specific Gravity, Urine 1.013 1.005 - 1.030   pH 7.0 5.0 - 8.0   Glucose, UA NEGATIVE NEGATIVE mg/dL   Hgb urine dipstick SMALL (A) NEGATIVE   Bilirubin Urine NEGATIVE NEGATIVE   Ketones, ur NEGATIVE NEGATIVE mg/dL   Protein, ur 100 (A) NEGATIVE mg/dL   Nitrite NEGATIVE NEGATIVE   Leukocytes,Ua NEGATIVE NEGATIVE   RBC / HPF 0-5 0 -  5 RBC/hpf   WBC, UA 0-5 0 - 5 WBC/hpf   Bacteria, UA NONE SEEN NONE SEEN    Comment: Performed at [facility], [place]  [address], [place], [place] [address]  Phosphorus     Status: None   Collection Time: 10/23/18  5:00 PM  Result Value Ref Range   Phosphorus 4.1 2.5 - 4.6 mg/dL    Comment: Performed at [facility] [address] [place], [place] [address]  Magnesium     Status: None   Collection Time: 10/23/18  5:00 PM  Result Value Ref Range   Magnesium 1.9 1.7 - 2.4 mg/dL    Comment: Performed at [facility], [place] [address], [place], [place] [address]   No results found.  Pending Labs Unresulted Labs (From admission, onward)    Start     Ordered   10/24/18 0500  Magnesium  Tomorrow morning,   R     10/23/18 1604   10/24/18 0500  Renal function panel  Tomorrow morning,   R     10/23/18 1604   10/23/18 1404  Novel Coronavirus,NAA,(SEND-OUT TO REF LAB - TAT 24-48 hrs); Hosp Order  (Asymptomatic Patients Labs)  Once,   STAT    Question:  Rule Out  Answer:  Yes   10/23/18 1404          Vitals/Pain Today's Vitals   10/23/18 1500 10/23/18 1515 10/23/18 1615 10/23/18 1833  BP: (!) 189/105 (!) 163/103 (!) 185/89 (!) 182/103  Pulse: (!) 106 92 92 93  Resp: (!) 23 16 (!) 23   Temp:      TempSrc:      SpO2: 97% 94% 99%   PainSc:        Isolation Precautions No active isolations  Medications Medications  labetalol (NORMODYNE) injection 20 mg (20 mg Intravenous Given 05/03/70 5366)  folic acid injection 1 mg (has no administration in time range)  thiamine (VITAMIN B-1) tablet 100 mg (has no administration in time range)  multivitamin with minerals tablet 1 tablet (has no administration in time range)  chlordiazePOXIDE (LIBRIUM) capsule 50 mg (has no administration in time range)  hydrOXYzine (ATARAX/VISTARIL) tablet 25 mg (has no administration in time range)  loperamide (IMODIUM) capsule 2-4 mg (has no administration in time range)  ondansetron (ZOFRAN-ODT) disintegrating tablet 4 mg (has no administration in time range)  enoxaparin (LOVENOX) injection 40 mg (has no administration  in time range)  atorvastatin (LIPITOR) tablet 40 mg (has no administration in time range)  metoprolol tartrate (LOPRESSOR) tablet 50 mg (has no administration in time range)  lactated ringers with KCl 20 mEq/L infusion (has no administration in time range)  chlordiazePOXIDE (LIBRIUM) capsule 25 mg (has no administration in time range)    Followed by  chlordiazePOXIDE (LIBRIUM) capsule 25 mg (has no administration in time range)    Followed by  chlordiazePOXIDE (LIBRIUM) capsule 25 mg (has no administration in time range)    Followed by  chlordiazePOXIDE (LIBRIUM) capsule 25 mg (has no administration in time range)  LORazepam (ATIVAN) tablet 1 mg ( Oral See Alternative 10/23/18 1905)    Or  LORazepam (ATIVAN) injection 1 mg (1 mg Intravenous Given 10/23/18 1905)  amLODipine (NORVASC) tablet 5 mg (has no administration in time range)  lisinopril (ZESTRIL) tablet 10 mg (has no administration in time range)  sodium chloride 0.9 % bolus 500 mL (0 mLs Intravenous Stopped 10/23/18 1459)  LORazepam (ATIVAN) injection 1 mg (1 mg Intravenous  Given 10/23/18 1417)  sodium chloride 0.9 % bolus 1,000 mL (0 mLs Intravenous Stopped 10/23/18 1908)  thiamine (B-1) injection 100 mg (100 mg Intravenous Given 10/23/18 1418)  LORazepam (ATIVAN) injection 1 mg (1 mg Intravenous Given 10/23/18 1504)    Mobility walks Low fall risk   Focused Assessments    R Recommendations: See Admitting Provider Note  Report given to:   Additional Notes:

## 2018-10-23 NOTE — ED Notes (Signed)
Consulted pharmacy about the administration of librium. They cleared the 50 mg.

## 2018-10-23 NOTE — ED Triage Notes (Signed)
Pt. Stated, Dan Henry had some tremors and a headache that started this morning.

## 2018-10-23 NOTE — ED Notes (Signed)
Pt drank water then vomited on floor.  Pt sitting in chair at this time

## 2018-10-23 NOTE — Progress Notes (Signed)
Pt admitted from ED with c/o of tremors and headache, however denies any pain at this time, settled in bed with call light within pt's reach, tele monitor put and verified on pt, safety measures explained and initiated accordingly, pt reassured and will continue to monitor. Dan Henry, Blenda Wisecup Efe

## 2018-10-23 NOTE — ED Notes (Signed)
Attempted to call report

## 2018-10-23 NOTE — ED Notes (Signed)
Pt pulled out IV, put clothes on and st's he has to leave.  St's he has to check on his house and get some clothes.  Advised pt that his family would take care of that for him.  Pt continues to walk toward the door.  Dr. Eulis Foster made aware.  Pt sitting in wheelchair at this time

## 2018-10-23 NOTE — ED Notes (Signed)
[name] -niece - [phone]

## 2018-10-23 NOTE — H&P (Addendum)
Date: 10/23/2018               Patient Name:  Dan Henry MRN: [id number]  DOB: 10/04/1950 Age / Sex: 68 y.o., male   PCP: Dan Rosenthal, MD         Medical Service: Internal Medicine Teaching Service         Attending Physician: Dr. Evette Henry, Dan Henry, *    First Contact: Dr. Gilberto Henry Pager: [phone]  Second Contact: Dr. Kathi Henry Pager: [phone]       After Hours (After 5p/  First Contact Pager: [phone]  weekends / holidays): Second Contact Pager: [phone]   Chief Complaint: Tremors  History of Present Illness:  Dan Henry is a 68 yo M w/ PMH of resistant HTN, alcohol use disorder and hepatic steatosis presenting with tremors and headache. He was in his usual state of health until about 10am this morning when he began to notice tremors in his hands while walking. He states he has had episode 2 weeks ago with similar symptoms but these symptoms resolved after he laid down and rested for 45 minutes. He also mentions that he has been having subjective fevers as well as seeing hallucinations, which he describes as 'man and woman fighting' He states he knows these visions are there but he knows they are not real. Denies any other Auditory or tactile hallucinations. Also endorsing diffuse headache but unable to describe in detail. He states he has history of hypertension and took all of his bp meds this morning. He denies taking any illicit substances or herbal supplements. He states he does have history of alcohol use with daily 3 24oz cans of beer. He denies prior hx of withdrawal but states his last drink was 2 days ago. Denies any blurry vision, loss of consciousness, numbness or tingling.  Spoke with Ms.Dan Henry, niece, on the phone who states that he has been having progressive cognitive decline with intermittent episodes of tremors that have been worked up for prior endocrine dysfunction with negative findings and was suspected to be due to alcohol withdrawal.  She mentions that he would repeatedly state he is taking his medications and not drinking much alcohol but would notice that his medications have not been touched and his drinking habits worsening. She mentions that he started drinking heavily after her mother, Dan Henry's sister, passed away 5 years ago as a way to cope with grief and his drinking habits have been consistently getting worse but she cannot describe exactly how much beer he drinks on a daily basis.  For his tremors and hallucinations, he was seen by [facility] as well as neurology in outpatient setting on 05/2017 for evaluation for transient episodes of tremors and hallucinations with negative work-up on CT head, TSH and CMP. He was noted to have elevated transaminitis and had further work-up with findings of right hepatic lobe lesion described as hemangioma and hepatic steatosis.  In the ED, he was found to have negative UDS, positive Ethanol at 105, mild hypokalemia at 3.3 and elevated liver enzymes with AST of 118, ALT of 82. IMTS was consulted for admission for alcohol withdrawal.  Meds:  Amloidpine 10mg  daily Atorvastatin 40mg  daily Chlorthalidone 50mg  daily Lisinopril 10mg  daily Metoprolol tartrate 50mg  BID Spironolactone 12.5mg  daily  Allergies: Allergies as of 10/23/2018   (No Known Allergies)   Past Medical History:  Diagnosis Date   Hypertension    Family History: Mother and father both had heart issues but unable  to recall age.  Social History: States he lives alone. Used to work for the health department but currently retired. Cooks at home, takes walks. Denies tobacco, illicit substance use. Mentions that he drinks 3 tall cans of beer daily.  Review of Systems: A complete ROS was negative except as per HPI.  Physical Exam: Blood pressure (!) 185/89, pulse 92, temperature 99 F (37.2 C), temperature source Oral, resp. rate (!) 23, SpO2 99 %.  Gen: Thin-appearing, tremulous CV: Tachycardic,  regular rhythm, no murmurs Pulm: CTAB, No rales, no wheezes Abd: Tense, BS+, No tenderness to palpation, no hepatomegaly Extm: ROM intact, Peripheral pulses intact, No peripheral edema Skin: Diaphoretic, Warm, normal turgor Neurologic exam: Mental status: A&Ox3 Cranial Nerves:             II: PERRL             III, IV, VI: Extra-occular motions intact bilaterally             V, VII: Face symmetric, sensation intact in all 3 divisions               VIII: hearing normal to rubbing fingers bilaterally               IX, X: palate rises symmetrically             XI: Head turn and shoulder shrug normal bilaterally               XII: tongue midline    Motor: Resting tremor in all extremities, bulk muscle and tone are normal  Sensory: Light touch intact and symmetric bilaterally  Psychiatric: Normal mood and affect  EKG: personally reviewed my interpretation is sinus tachycardia, normal axis, T wave inversions in V1, aVl unchanged from prior EKG.  Assessment & Plan by Problem: Active Problems:   Acute alcohol abuse, with delirium ([place])  [name] is a 68 yo M w/ PMH of resistant hypertension admit for tremors. Considering consistent history of alcohol consumption, most likely due to alcohol withdrawal especially with complaints of hallucinations. Telephone note mentions prior hx of medication non-adherence and possible cognitive decline which makes his account unreliable. Blood alcohol level on admission confirm history of alcohol use with transaminitis further supporting this diagnosis. Will require inpatient admission for treatment with benzodiazepines.  Delirium Tremens Daily 24ozx3 beer consumption per pt report although possibly unreliable based on recent telephone encounters. CIWA score >20 on exam with diaphoresis, tremors, nursing reports of confusion, nausea and vomiting. - Librium taper: 50mg  once + 25mg  four times daily ->TID->BID - CIWA w/ Ativan 1mg  q4hr - Hydroxyzine 25mg  PRN  for anxiety - Thiamine injection given in ED - LR w/ KCl 12mEq - C/w thiamine, folate supplementation  Hypertensive Urgency Admit bp 185/89. Consistently elevated despite on 3+ anti-hypertensive meds. Suspect unreliable history of medication adherence per family. Pheochromocytoma, Hyperaldo, Renal artery stenosis all ruled out in outpatient setting - Labetalol 20mg  PRN for BP >180  - Holding home chlorthalidone in setting of hypokalemia -  C/w home meds: amlodipine 5mg  daily, metoprolol tartrate 50mg  BID, lisinopril 10mg  daily, spironolactone 12.5mg  daily  Transaminitis 2/2 Alcoholic Hepatitis AST 161, ALT 82, On chart review, hx of Hep A but likely alcoholic this admission as his ethanol level was 105. Recent MRI Abd: Right hepatic hemangioma & 76mm cyst, diffuse hepatic steatosis. - Trend liver enzymes - Consider further work-up if continuing to be elevated w/ hepatitis panel  Normocytic Anemia Hgb 11.9, MCV 93. Borderline macrocytic, possibly due  to B12, Folate deficiency. RDW elevated at 16.4, concerning for bleed. High risk for GI bleed 2/2 alcohol use. - Monitor for melena - Trend CBC  Hyperlipidemia Lipid panel 05/2017: T.chole 173, Triglycerides 50, HDL 104, LDL 59. On atorvastatin 40mg  daily at home - C/w home med: atorvastatin 40mg  daily  Hypokalemia 2/2 poor oral intake vs thiazide use Admit K 3.3, Suspect poor oral intake in setting of alcohol use disorder - Replete K w/ IV as mentioned above  DVT prophx: Lovenox Diet: Cardiac Code: Full  Dispo: Admit patient to Inpatient with expected length of stay greater than 2 midnights.  Signed: [name], MD 10/23/2018, 4:39 PM  Pager: [phone]

## 2018-10-23 NOTE — ED Provider Notes (Signed)
[facility] EMERGENCY DEPARTMENT Provider Note   CSN: [id number] Arrival date & time: 10/23/18  1018    History   Chief Complaint Chief Complaint  Patient presents with  . Tremors  . Headache    HPI Dan Henry is a 68 y.o. male.     HPI   Patient presents for evaluation of "tremors."  Came from home.  Patient has been following with his doctor for hypertension and noted to be noncompliant with his blood pressure medicines.  He states he did take his blood pressure medicines this morning.  He denies headaches, fever, chills, cough, shortness of breath, focal weakness or paresthesia.  There have been no known sick contacts.  There are no other known modifying factors.  Past Medical History:  Diagnosis Date  . Hypertension     Patient Active Problem List   Diagnosis Date Noted  . Acute alcohol abuse, with delirium ([place]) 10/23/2018  . Hepatic hemangioma 10/19/2018  . Visual hallucination 06/26/2018  . Abnormal TSH 06/26/2018  . Need for hepatitis B vaccination 02/21/2018  . Increased anion gap metabolic acidosis 01/60/1093  . Unintentional weight loss 02/16/2018  . Hypokalemia 07/19/2017  . Benign essential tremor 07/19/2017  . Elevated LFTs 05/16/2017  . ASCVD (arteriosclerotic cardiovascular disease) 05/16/2017  . Right carotid bruit 05/08/2017  . Essential hypertension 05/21/2015  . Healthcare maintenance 05/21/2015    Past Surgical History:  Procedure Laterality Date  . NO PAST SURGERIES          Home Medications    Prior to Admission medications   Medication Sig Start Date End Date Taking? Authorizing Provider  acetaminophen (TYLENOL) 500 MG tablet Take 1,000 mg by mouth every 6 (six) hours as needed for mild pain or headache.    [provider]  amLODipine (NORVASC) 10 MG tablet TAKE 1 TABLET BY MOUTH ONCE DAILY 06/27/18   Velna Ochs, MD  atorvastatin (LIPITOR) 40 MG tablet Take 1 tablet (40 mg total) by mouth daily.  07/03/18   Oval Linsey, MD  chlorthalidone (HYGROTON) 50 MG tablet Take 1 tablet by mouth once daily 07/17/18   Agyei, Caprice Kluver, MD  lisinopril (PRINIVIL,ZESTRIL) 10 MG tablet Take 1 tablet (10 mg total) by mouth daily. 08/03/18   [name], MD  metoprolol tartrate (LOPRESSOR) 50 MG tablet Take 1 tablet (50 mg total) by mouth 2 (two) times daily. 12/07/17 01/06/18  [name], MD  spironolactone (ALDACTONE) 25 MG tablet Take 0.5 tablets (12.5 mg total) by mouth daily. 10/04/18   [name], MD    Family History Family History  Problem Relation Age of Onset  . Hypertension Mother   . Hypertension Father   . Hypertension Sister   . Hypertension Sister   . Stroke Sister   . Cancer Brother        throat    Social History Social History   Tobacco Use  . Smoking status: Never Smoker  . Smokeless tobacco: Never Used  Substance Use Topics  . Alcohol use: Yes    Alcohol/week: 4.0 standard drinks    Types: 4 Standard drinks or equivalent per week    Comment: occ  . Drug use: No     Allergies   Patient has no known allergies.   Review of Systems Review of Systems  All other systems reviewed and are negative.    Physical Exam Updated Vital Signs BP (!) 163/103   Pulse 92   Temp 99 F (37.2 C) (Oral)  Resp 16   SpO2 94%   Physical Exam Vitals signs and nursing note reviewed.  Constitutional:      General: He is not in acute distress.    Appearance: He is well-developed. He is ill-appearing. He is not toxic-appearing or diaphoretic.     Comments: He is underweight  HENT:     Head: Normocephalic and atraumatic.     Right Ear: External ear normal.     Left Ear: External ear normal.  Eyes:     General: No scleral icterus.    Conjunctiva/sclera: Conjunctivae normal.     Pupils: Pupils are equal, round, and reactive to light. Pupils are equal.  Neck:     Musculoskeletal: Normal range of motion and neck supple.     Trachea: Phonation normal.  Cardiovascular:      Rate and Rhythm: Regular rhythm. Tachycardia present.     Heart sounds: Normal heart sounds.  Pulmonary:     Effort: Pulmonary effort is normal.     Breath sounds: Normal breath sounds.  Abdominal:     General: There is no distension.     Palpations: Abdomen is soft. There is no mass.     Tenderness: There is no abdominal tenderness. There is no guarding.  Musculoskeletal: Normal range of motion.        General: No swelling or tenderness.  Skin:    General: Skin is warm and dry.  Neurological:     Mental Status: He is alert and oriented to person, place, and time.     Cranial Nerves: No cranial nerve deficit.     Sensory: No sensory deficit.     Motor: No abnormal muscle tone.     Coordination: Coordination normal.     Comments: No dysarthria, aphasia or nystagmus.  Mild bilateral tremor, hands and arms.  No clear asterixis.  No pronator drift.  Psychiatric:        Mood and Affect: Mood normal.        Speech: Speech normal.        Behavior: Behavior normal.        Thought Content: Thought content normal.        Judgment: Judgment normal.      ED Treatments / Results  Labs (all labs ordered are listed, but only abnormal results are displayed) Labs Reviewed  CBC WITH DIFFERENTIAL/PLATELET - Abnormal; Notable for the following components:      Result Value   RBC 3.88 (*)    Hemoglobin 11.9 (*)    HCT 36.1 (*)    RDW 16.4 (*)    All other components within normal limits  BASIC METABOLIC PANEL - Abnormal; Notable for the following components:   Potassium 3.3 (*)    Glucose, Bld 130 (*)    All other components within normal limits  ETHANOL - Abnormal; Notable for the following components:   Alcohol, Ethyl (B) 105 (*)    All other components within normal limits  HEPATIC FUNCTION PANEL - Abnormal; Notable for the following components:   Total Protein 8.5 (*)    AST 118 (*)    ALT 82 (*)    All other components within normal limits  AMMONIA - Abnormal; Notable for the  following components:   Ammonia 43 (*)    All other components within normal limits  URINALYSIS, ROUTINE W REFLEX MICROSCOPIC - Abnormal; Notable for the following components:   Color, Urine STRAW (*)    Hgb urine dipstick SMALL (*)  Protein, ur 100 (*)    All other components within normal limits  NOVEL CORONAVIRUS, NAA (HOSPITAL ORDER, SEND-OUT TO REF LAB)  RAPID URINE DRUG SCREEN, HOSP PERFORMED  PHOSPHORUS  MAGNESIUM    EKG EKG Interpretation  Date/Time:  Tuesday October 23 2018 13:52:44 EDT Ventricular Rate:  155 PR Interval:    QRS Duration: 100 QT Interval:  300 QTC Calculation: 482 R Axis:   36 Text Interpretation:  Supraventricular tachycardia RSR' in V1 or V2, probably normal variant Left ventricular hypertrophy ST depression, probably rate related Artifact in lead(s) III aVF and baseline wander in lead(s) III aVL aVF Since last tracing of earlier today Rate faster Confirmed by Daleen Bo [phone]) on 10/23/2018 2:00:03 PM     Radiology No results found.  Procedures .Critical Care Performed by: Daleen Bo, MD Authorized by: Daleen Bo, MD   Critical care provider statement:    Critical care time (minutes):  35   Critical care start time:  10/23/2018 11:30 AM   Critical care end time:  10/23/2018 3:22 PM   Critical care time was exclusive of:  Separately billable procedures and treating other patients   Critical care was necessary to treat or prevent imminent or life-threatening deterioration of the following conditions:  Circulatory failure   Critical care was time spent personally by me on the following activities:  Blood draw for specimens, development of treatment plan with patient or surrogate, discussions with consultants, evaluation of patient's response to treatment, examination of patient, obtaining history from patient or surrogate, ordering and performing treatments and interventions, ordering and review of laboratory studies, pulse oximetry,  re-evaluation of patient's condition, review of old charts and ordering and review of radiographic studies   (including critical care time)  Medications Ordered in ED Medications  labetalol (NORMODYNE) injection 20 mg (20 mg Intravenous Given 3/71/06 2694)  folic acid injection 1 mg (has no administration in time range)  thiamine (B-1) injection 100 mg (has no administration in time range)  thiamine (VITAMIN B-1) tablet 100 mg (has no administration in time range)  multivitamin with minerals tablet 1 tablet (has no administration in time range)  chlordiazePOXIDE (LIBRIUM) capsule 50 mg (has no administration in time range)  chlordiazePOXIDE (LIBRIUM) capsule 25 mg (has no administration in time range)    Followed by  chlordiazePOXIDE (LIBRIUM) capsule 25 mg (has no administration in time range)    Followed by  chlordiazePOXIDE (LIBRIUM) capsule 25 mg (has no administration in time range)    Followed by  chlordiazePOXIDE (LIBRIUM) capsule 25 mg (has no administration in time range)  hydrOXYzine (ATARAX/VISTARIL) tablet 25 mg (has no administration in time range)  loperamide (IMODIUM) capsule 2-4 mg (has no administration in time range)  ondansetron (ZOFRAN-ODT) disintegrating tablet 4 mg (has no administration in time range)  enoxaparin (LOVENOX) injection 40 mg (has no administration in time range)  sodium chloride 0.9 % bolus 500 mL (0 mLs Intravenous Stopped 10/23/18 1459)  LORazepam (ATIVAN) injection 1 mg (1 mg Intravenous Given 10/23/18 1417)  sodium chloride 0.9 % bolus 1,000 mL (1,000 mLs Intravenous New Bag/Given 10/23/18 1419)  thiamine (B-1) injection 100 mg (100 mg Intravenous Given 10/23/18 1418)  LORazepam (ATIVAN) injection 1 mg (1 mg Intravenous Given 10/23/18 1504)     Initial Impression / Assessment and Plan / ED Course  I have reviewed the triage vital signs and the nursing notes.  Pertinent labs & imaging results that were available during my care of the patient were  reviewed by  me and considered in my medical decision making (see chart for details).  Clinical Course as of Oct 22 1601  Tue Oct 23, 2018  1358 Normal  Rapid urine drug screen (hospital performed) [EW]  1358 Normal except total protein high, AST high, ALT high  Hepatic function panel(!) [EW]  1359 Elevated  Ethanol(!) [EW]  1359 Normal except hemoglobin low  CBC with Differential(!) [EW]  1359 Normal except presence of hemoglobin and protein  Urinalysis, Routine w reflex microscopic(!) [EW]  1359 Mild elevation  Ammonia(!) [EW]  1359 Normal except potassium low, glucose high  Basic metabolic panel(!) [EW]  5027 He continues to be alert and conversant.  Heart rate is labile, 125-145 while I am in the room with him at this time.  Continues to have bilateral tremor, is nonspecific.  He admits to ongoing alcohol use but denies prior alcohol withdrawal symptoms or delirium tremens.  At this time he is conversant, and lucid.   [EW]  1407 Per niece he has ongoing tremors and high blood pressure. Today he had a HA, and was weak. He has had trouble taking medicines as directed. He didn't take his meds over the weekend. He thinks that he take it, but the pill boxes still have meds in them. 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 No POA. [name] is his niece and she helps other family members.   [EW]  1521 This time the patient is alert and comfortable and has noticeably less tremor.  Pressure 163/101 with heart rate 97, at this time.  Patient agrees to hospitalization.   [EW]    Clinical Course User Index [EW] Daleen Bo, MD        Patient Vitals for the past 24 hrs:  BP Temp Temp src Pulse Resp SpO2  10/23/18 1515 (!) 163/103 - - 92 16 94 %  10/23/18 1500 (!) 189/105 - - (!) 106 (!) 23 97 %  10/23/18 1445 (!) 181/105 - - 96 17 97 %  10/23/18 1430 (!) 180/106 - - 98 19 98 %  10/23/18 1415 (!) 144/114 - - (!) 108 18 98 %  10/23/18 1400 (!) 200/96 - - (!)  124 (!) 26 98 %  10/23/18 1354 (!) 185/115 - - (!) 166 (!) 24 98 %  10/23/18 1330 (!) 207/107 - - (!) 118 19 100 %  10/23/18 1315 (!) 206/106 - - (!) 121 20 99 %  10/23/18 1300 (!) 204/110 - - (!) 122 18 99 %  10/23/18 1245 (!) 208/107 - - (!) 121 16 99 %  10/23/18 1230 (!) 215/105 - - (!) 127 18 100 %  10/23/18 1215 (!) 226/112 - - (!) 129 17 99 %  10/23/18 1200 (!) 210/123 - - (!) 121 17 100 %  10/23/18 1145 (!) 225/138 - - (!) 149 (!) 23 100 %  10/23/18 1023 (!) 167/118 99 F (37.2 C) Oral (!) 129 14 98 %    3:22 PM Reevaluation with update and discussion. After initial assessment and treatment, an updated evaluation reveals tremors almost completely resolved.  He remains alert, somewhat needed after 2 doses of Ativan.  Findings discussed, questions answered. Daleen Bo   Medical Decision Making: Trouble with high blood pressure and medication noncompliance.  Patient is apparent alcoholic.  I suspect he is in alcohol withdrawal, at this time without signs and symptoms of impending DTs.  Patient will require close observation and treatment in the ER setting, in the hospital.  Doubt acute CNS abnormality, hypertensive urgency, serious bacterial infection or metabolic instability.  CRITICAL CARE- yes Performed by: Daleen Bo  Nursing Notes Reviewed/ Care Coordinated Applicable Imaging Reviewed Interpretation of Laboratory Data incorporated into ED treatment  3:22 PM-Consult complete with on-call internal medicine resident. Patient case explained and discussed.  They agree to admit patient for further evaluation and treatment. Call ended at 1540  Plan: Admit  Final Clinical Impressions(s) / ED Diagnoses   Final diagnoses:  Alcohol withdrawal syndrome without complication ([place])  Hypertension, unspecified type  Noncompliance with medication regimen  Alcoholic hepatitis, unspecified whether ascites present  Hypokalemia    ED Discharge Orders    None       Daleen Bo,  MD 10/23/18 [phone]

## 2018-10-24 ENCOUNTER — Ambulatory Visit (HOSPITAL_COMMUNITY): Payer: Medicare HMO

## 2018-10-24 ENCOUNTER — Other Ambulatory Visit: Payer: Self-pay

## 2018-10-24 ENCOUNTER — Encounter (HOSPITAL_COMMUNITY): Payer: Self-pay | Admitting: *Deleted

## 2018-10-24 DIAGNOSIS — R339 Retention of urine, unspecified: Secondary | ICD-10-CM

## 2018-10-24 DIAGNOSIS — E440 Moderate protein-calorie malnutrition: Secondary | ICD-10-CM | POA: Insufficient documentation

## 2018-10-24 DIAGNOSIS — Z79899 Other long term (current) drug therapy: Secondary | ICD-10-CM

## 2018-10-24 DIAGNOSIS — I10 Essential (primary) hypertension: Secondary | ICD-10-CM

## 2018-10-24 DIAGNOSIS — F10231 Alcohol dependence with withdrawal delirium: Principal | ICD-10-CM

## 2018-10-24 DIAGNOSIS — Z960 Presence of urogenital implants: Secondary | ICD-10-CM

## 2018-10-24 HISTORY — DX: Moderate protein-calorie malnutrition: E44.0

## 2018-10-24 LAB — RENAL FUNCTION PANEL
Albumin: 3.9 g/dL (ref 3.5–5.0)
Anion gap: 13 (ref 5–15)
BUN: 10 mg/dL (ref 8–23)
CO2: 27 mmol/L (ref 22–32)
Calcium: 9 mg/dL (ref 8.9–10.3)
Chloride: 100 mmol/L (ref 98–111)
Creatinine, Ser: 1.01 mg/dL (ref 0.61–1.24)
GFR calc Af Amer: >60 mL/min (ref >60)
GFR calc non Af Amer: >60 mL/min (ref >60)
Glucose, Bld: 108 mg/dL — ABNORMAL HIGH (ref 70–99)
Phosphorus: 3.3 mg/dL (ref 2.5–4.6)
Potassium: 3.9 mmol/L (ref 3.5–5.1)
Sodium: 140 mmol/L (ref 135–145)

## 2018-10-24 LAB — MAGNESIUM: Magnesium: 1.6 mg/dL — ABNORMAL LOW (ref 1.7–2.4)

## 2018-10-24 LAB — URINALYSIS, ROUTINE W REFLEX MICROSCOPIC
Bacteria, UA: NONE SEEN
Bilirubin Urine: NEGATIVE
Glucose, UA: NEGATIVE mg/dL
Ketones, ur: 5 mg/dL — AB
Leukocytes,Ua: NEGATIVE
Nitrite: NEGATIVE
Protein, ur: 100 mg/dL — AB
Specific Gravity, Urine: 1.019 (ref 1.005–1.030)
pH: 7 (ref 5.0–8.0)

## 2018-10-24 LAB — NOVEL CORONAVIRUS, NAA (HOSP ORDER, SEND-OUT TO REF LAB; TAT 18-24 HRS): SARS-CoV-2, NAA: NOT DETECTED

## 2018-10-24 MED ORDER — ENSURE ENLIVE PO LIQD
237.0000 mL | Freq: Two times a day (BID) | ORAL | Status: DC
  Administered 2018-10-24 – 2018-10-28 (×7): 237 mL via ORAL

## 2018-10-24 MED ORDER — FOLIC ACID 1 MG PO TABS
1.0000 mg | ORAL_TABLET | Freq: Every day | ORAL | Status: DC
  Administered 2018-10-25 – 2018-10-28 (×4): 1 mg via ORAL
  Filled 2018-10-24 (×3): qty 1

## 2018-10-24 MED ORDER — MAGNESIUM SULFATE 2 GM/50ML IV SOLN
2.0000 g | Freq: Once | INTRAVENOUS | Status: AC
  Administered 2018-10-24: 2 g via INTRAVENOUS
  Filled 2018-10-24: qty 50

## 2018-10-24 NOTE — Progress Notes (Signed)
Initial Nutrition Assessment  DOCUMENTATION CODES:   Non-severe (moderate) malnutrition in context of social or environmental circumstances  INTERVENTION:  Ensure Enlive po BID, each supplement provides 350 kcal and 20 grams of protein (likes all flavors)  Magic cup TID with meals, each supplement provides 290 kcal and 9 grams of protein (no preference on flavor)  Monitor magnesium, potassium, and phosphorus daily for at least 3 days, MD to replete as needed, as pt is at risk for refeeding syndrome given history of EtOH abuse.  NUTRITION DIAGNOSIS:   Moderate Malnutrition related to social / environmental circumstances(EtOH abuse) as evidenced by moderate fat depletion, moderate muscle depletion.   GOAL:   Patient will meet greater than or equal to 90% of their needs   MONITOR:   PO intake, Supplement acceptance, Weight trends, Labs  REASON FOR ASSESSMENT:   Malnutrition Screening Tool    ASSESSMENT:   68 year old male admitted with PMH of HTN admitted for EtOH withdrawal, tremors, and visual hallucination   Patient sitting up with lunch tray at visit. Patient reports eating most of lunch and endorses good appetite during stay - stating that he has been here x 4 days, pt admitted on 6/23.   RD asked patient about eating habits at home to which he reports 3-4 meals 4 ensure daily. He could not recall a typical meal and stated that he liked all foods.   Patient endorses recent wt gains and reports UBW of 118 lbs. Patient expressed desire to gain 10 additional pounds and stated that he has always been a small guy.    Medications reviewed Labs reviewed  NUTRITION - FOCUSED PHYSICAL EXAM:    Most Recent Value  Orbital Region  Mild depletion  Upper Arm Region  Mild depletion  Thoracic and Lumbar Region  Mild depletion  Buccal Region  Mild depletion  Temple Region  Mild depletion  Clavicle Bone Region  Moderate depletion  Clavicle and Acromion Bone Region  Moderate  depletion  Scapular Bone Region  Mild depletion  Dorsal Hand  Moderate depletion  Patellar Region  Moderate depletion  Anterior Thigh Region  Moderate depletion  Posterior Calf Region  Mild depletion  Edema (RD Assessment)  None  Hair  Reviewed  Eyes  Reviewed  Mouth  Reviewed  Skin  Reviewed  Nails  Reviewed       Diet Order:   Diet Order            Diet Heart Room service appropriate? Yes; Fluid consistency: Thin  Diet effective now              EDUCATION NEEDS:   Not appropriate for education at this time  Skin:  Skin Assessment: Reviewed RN Assessment  Last BM:  unknown  Height:   Ht Readings from Last 1 Encounters:  10/23/18 5\' 4"  (1.626 m)    Weight:   Wt Readings from Last 1 Encounters:  10/23/18 54.6 kg    Ideal Body Weight:  59 kg  BMI:  Body mass index is 20.66 kg/m.  Estimated Nutritional Needs:   Kcal:  2694-8546  Protein:  65-76g  Fluid:  1.6-1.7L/day    Lajuan Lines, RD, LDN  After Hours/Weekend Pager: [phone]

## 2018-10-24 NOTE — Progress Notes (Signed)
   Subjective:  Dan Henry is a 68 y.o. M with PMH of HTN, AUD admit for tremors and hallucinations on hospital day 1  Dan Henry was examined at bedside and reports that he slept well yesterday. He still endorses tremors but denies visual or auditory hallucination. He states that his last drink was about a week ago. Typically he drinks only a couple times a week. He stopped consuming alcohol due to tremors. He states that the tremors in his lower extremity are a "strange feeling." Previously, EtOH will typically help with the tremors. He doesn't have much of an appetite. He denies any falls at home..   Objective:  Vital signs in last 24 hours: Vitals:   10/23/18 2030 10/23/18 2124 10/23/18 2324 10/24/18 0425  BP: (!) 170/88 (!) 177/103 (!) 156/88 (!) 162/91  Pulse: 91 (!) 101 84 80  Resp: 16 18 (!) 22 17  Temp:  98.6 F (37 C) 98.7 F (37.1 C) 98.4 F (36.9 C)  TempSrc:  Oral Oral Oral  SpO2: 98% 100% 96% 99%  Weight:  54.6 kg    Height:  5\' 4"  (1.626 m)     Physical Exam  Constitutional: No distress.  Thin-appearing  Abdominal: Soft. Bowel sounds are normal. He exhibits no distension. There is no abdominal tenderness.  Musculoskeletal: Normal range of motion.        General: No edema.  Neurological: He is alert.  Resting tremor much improved compared to yesterday. Lateral gaze intact.  Skin: Skin is warm and dry.   Assessment/Plan:  Active Problems:   Acute alcohol abuse, with delirium ([place])  Dan Henry is a 68 yo M w/ PMH of resistant hypertension admit for tremors. His confusion and hallucinations have resolved. Appear more comfortable this am with improved tremors. Suspect at the tail end of his withdrawal. Unclear if tremors are recurrent at baseline although PCP and family mentions only intermittent episodes. Will continue monitor until his symptoms resolve.   Delirium Tremens CIWA scores trended down overnight from 15->4->4->4. Only 1 dose of extra Ativan given.  Currently endorsing mild resting tremors but otherwise appears improved. Unclear if essential tremors vs withdrawal-induced will continue to monitor. - C/w Librium taper: 25mg  four times daily today ->TID->BID - CIWA w/ Ativan 1mg  q4hr - Hydroxyzine 25mg  PRN for anxiety - C/w thiamine, folate supplementation  Severe asymptomatic essential hypertension  BP 162/91 this am. Started on lisinopril, metoprolol and amlodipine yesterday. Likely also exacerbated by withdrawal symptoms. Improved from admission and bp should be be better controlled as his oral bp meds start taking effect and withdrawal symptoms are better treated. -  C/w amlodipine 5mg  daily, metoprolol tartrate 50mg  BID, lisinopril 10mg  daily  Drug-Induced Urinary Retention Incidentally noted to have significant urinary retention on POC ultrasound. Likely due to benzodiazepines. Nursing staff mentions at the time of bladder scan, he had a recent urine output. - UA - Foley - Monitor I&Os - Stop fluids  DVT prophx: Lovenox Diet: Cardiac Code: Full  Dispo: Anticipated discharge in approximately 1-2 day(s).   [name], MD 10/24/2018, 6:44 AM Pager: [phone]

## 2018-10-25 DIAGNOSIS — R339 Retention of urine, unspecified: Secondary | ICD-10-CM

## 2018-10-25 DIAGNOSIS — R330 Drug induced retention of urine: Secondary | ICD-10-CM

## 2018-10-25 LAB — CBC
HCT: 37 % — ABNORMAL LOW (ref 39.0–52.0)
Hemoglobin: 12.4 g/dL — ABNORMAL LOW (ref 13.0–17.0)
MCH: 30.8 pg (ref 26.0–34.0)
MCHC: 33.5 g/dL (ref 30.0–36.0)
MCV: 91.8 fL (ref 80.0–100.0)
Platelets: 188 10*3/uL (ref 150–400)
RBC: 4.03 MIL/uL — ABNORMAL LOW (ref 4.22–5.81)
RDW: 15.3 % (ref 11.5–15.5)
WBC: 5.1 10*3/uL (ref 4.0–10.5)
nRBC: 0 % (ref 0.0–0.2)

## 2018-10-25 LAB — RENAL FUNCTION PANEL
Albumin: 3.6 g/dL (ref 3.5–5.0)
Anion gap: 10 (ref 5–15)
BUN: 17 mg/dL (ref 8–23)
CO2: 25 mmol/L (ref 22–32)
Calcium: 9 mg/dL (ref 8.9–10.3)
Chloride: 104 mmol/L (ref 98–111)
Creatinine, Ser: 1.14 mg/dL (ref 0.61–1.24)
GFR calc Af Amer: >60 mL/min (ref >60)
GFR calc non Af Amer: >60 mL/min (ref >60)
Glucose, Bld: 98 mg/dL (ref 70–99)
Phosphorus: 3 mg/dL (ref 2.5–4.6)
Potassium: 3.4 mmol/L — ABNORMAL LOW (ref 3.5–5.1)
Sodium: 139 mmol/L (ref 135–145)

## 2018-10-25 LAB — MAGNESIUM: Magnesium: 2.1 mg/dL (ref 1.7–2.4)

## 2018-10-25 LAB — CK: Total CK: 1161 U/L — ABNORMAL HIGH (ref 49–397)

## 2018-10-25 MED ORDER — AMLODIPINE BESYLATE 10 MG PO TABS
10.0000 mg | ORAL_TABLET | Freq: Every day | ORAL | Status: DC
  Administered 2018-10-25 – 2018-10-28 (×4): 10 mg via ORAL
  Filled 2018-10-25 (×3): qty 1

## 2018-10-25 MED ORDER — POTASSIUM CHLORIDE CRYS ER 20 MEQ PO TBCR
20.0000 meq | EXTENDED_RELEASE_TABLET | Freq: Once | ORAL | Status: AC
  Administered 2018-10-25: 07:00:00 20 meq via ORAL
  Filled 2018-10-25: qty 1

## 2018-10-25 MED ORDER — SODIUM CHLORIDE 0.9 % IV BOLUS
1000.0000 mL | Freq: Once | INTRAVENOUS | Status: AC
  Administered 2018-10-25: 1000 mL via INTRAVENOUS

## 2018-10-25 MED ORDER — SODIUM CHLORIDE 0.9 % IV BOLUS: 1000.0000 mL | Freq: Once | INTRAVENOUS | Status: DC

## 2018-10-25 MED ORDER — CHLORDIAZEPOXIDE HCL 25 MG PO CAPS
25.0000 mg | ORAL_CAPSULE | Freq: Two times a day (BID) | ORAL | Status: AC
  Administered 2018-10-25 (×2): 25 mg via ORAL
  Filled 2018-10-25: qty 1

## 2018-10-25 MED ORDER — LORAZEPAM 2 MG/ML IJ SOLN
1.0000 mg | Freq: Once | INTRAMUSCULAR | Status: AC
  Administered 2018-10-25: 23:00:00 1 mg via INTRAVENOUS

## 2018-10-25 MED ORDER — AMLODIPINE BESYLATE 10 MG PO TABS: 10.0000 mg | ORAL_TABLET | Freq: Every day | ORAL | Status: DC

## 2018-10-25 MED ORDER — CHLORDIAZEPOXIDE HCL 25 MG PO CAPS: 25.0000 mg | ORAL_CAPSULE | Freq: Every day | ORAL | Status: DC

## 2018-10-25 NOTE — Plan of Care (Signed)
Progressing toward goals. 

## 2018-10-25 NOTE — Progress Notes (Signed)
   Subjective:  Dan Henry is a 68 y.o. M with PMH of HTN, AUD admit for tremors on hospital day 2  Dan Henry was examined at bedside and reports that he feels better this morning. He states he feels his tremors have improved and his visual hallucination has not yet returned. Also spoke with nursing staff at bedside who reports he had 1 episode of confusion but did not have additional Ativan requirement. He states he is having discomfort due to his foley catheter with ambulation, but not at rest and requests that it be taken out.  Objective:  Vital signs in last 24 hours: Vitals:   10/24/18 1555 10/24/18 1920 10/24/18 2305 10/25/18 0328  BP: (!) 152/88 (!) 148/84 136/72 (!) 150/89  Pulse: 75 80 70 66  Resp: 18     Temp: 98.2 F (36.8 C) 98.1 F (36.7 C) 98.1 F (36.7 C) 98.1 F (36.7 C)  TempSrc: Oral Oral Oral Oral  SpO2: 98% 99% 99% 100%  Weight:      Height:       Gen: Well-developed, thin-appearing, NAD Abd: BS+, NTND, No rebound, no guarding GU: Foley catheter in place with dark orange urine output Skin: Dry, Warm, normal turgor, no rashes, lesions, wounds.  Neuro: AAOx3, minimal tremors Psych: Normal mood and affect  Assessment/Plan:  Principal Problem:   Alcohol withdrawal (HCC) Active Problems:   Essential hypertension   Benign essential tremor   Visual hallucination   Malnutrition of moderate degree  Dan Henry is a 68 yo M w/ PMH of resistant hypertension admit for tremors. He had elevated His CIWA scores have been declining w/ last 3 scores being 7-3-4. Last dose of IV Ativan on 6/23. Received additional oral ativan at noon yesterday but no further requirement since. Continuing Librium taper at 25mg  TID today. Should be ready for discharge tomorrow if he is able to urinate with decreasing doses of the Librium.  Delirium Tremens CIWA scores trended down overnight from 10->7->3->4. Last additional Ativan yesterday at 12pm.  Currently endorsing mild resting  tremors but otherwise appears improved. May be able to tolerate more rapid taper. Received Librium four times daily yesterday. Will try BID today w/ close monitoring - C/w Librium taper: 25mg  BID today - CIWA w/ Ativan 1mg  q4hr - Hydroxyzine 25mg  PRN for anxiety - C/w thiamine, folate supplementation  Severe asymptomatic essential hypertension  BP 150/89 this am. Started on lisinopril, metoprolol and amlodipine yesterday. Bp starting to come down with better control of his withdrawal symptoms. Will hold off on increaseing anti-hypertensive doses at this point.  - C/w amlodipine 5mg  daily, metoprolol tartrate 50mg  BID, lisinopril 10mg  daily  Drug-Induced Urinary Retention Significant urine output noted on foley bag but with dark orange color. Muscle breakdown from tremors. UA w/ moderate hemoglobin, 11-20 RBCs - NS bolus - CK - C/w Foley - Monitor I&Os  DVT prophx: Lovenox Diet: Cardiac Code: Full  Dispo: Anticipated discharge in approximately 1-2 day(s).   [name], MD 10/25/2018, 6:29 AM Pager: [phone]

## 2018-10-25 NOTE — Progress Notes (Signed)
   10/25/18 2245  Provider Notification  Provider Name/Title Dr Sharon Seller  Date Provider Notified 10/25/18  Time Provider Notified 2245  Notification Type Page  Notification Reason Other (Comment) (pt very agitated and impulsive)  Response See new orders  Date of Provider Response 10/25/18  Time of Provider Response 2248

## 2018-10-26 DIAGNOSIS — M6282 Rhabdomyolysis: Secondary | ICD-10-CM

## 2018-10-26 DIAGNOSIS — E876 Hypokalemia: Secondary | ICD-10-CM

## 2018-10-26 HISTORY — DX: Rhabdomyolysis: M62.82

## 2018-10-26 LAB — CBC
HCT: 35.6 % — ABNORMAL LOW (ref 39.0–52.0)
Hemoglobin: 11.9 g/dL — ABNORMAL LOW (ref 13.0–17.0)
MCH: 30.4 pg (ref 26.0–34.0)
MCHC: 33.4 g/dL (ref 30.0–36.0)
MCV: 90.8 fL (ref 80.0–100.0)
Platelets: 182 10*3/uL (ref 150–400)
RBC: 3.92 MIL/uL — ABNORMAL LOW (ref 4.22–5.81)
RDW: 14.9 % (ref 11.5–15.5)
WBC: 5.6 10*3/uL (ref 4.0–10.5)
nRBC: 0 % (ref 0.0–0.2)

## 2018-10-26 LAB — COMPREHENSIVE METABOLIC PANEL
ALT: 56 U/L — ABNORMAL HIGH (ref 0–44)
AST: 80 U/L — ABNORMAL HIGH (ref 15–41)
Albumin: 3.6 g/dL (ref 3.5–5.0)
Alkaline Phosphatase: 45 U/L (ref 38–126)
Anion gap: 10 (ref 5–15)
BUN: 20 mg/dL (ref 8–23)
CO2: 23 mmol/L (ref 22–32)
Calcium: 8.9 mg/dL (ref 8.9–10.3)
Chloride: 105 mmol/L (ref 98–111)
Creatinine, Ser: 1.18 mg/dL (ref 0.61–1.24)
GFR calc Af Amer: >60 mL/min (ref >60)
GFR calc non Af Amer: >60 mL/min (ref >60)
Glucose, Bld: 118 mg/dL — ABNORMAL HIGH (ref 70–99)
Potassium: 3 mmol/L — ABNORMAL LOW (ref 3.5–5.1)
Sodium: 138 mmol/L (ref 135–145)
Total Bilirubin: 0.8 mg/dL (ref 0.3–1.2)
Total Protein: 6.6 g/dL (ref 6.5–8.1)

## 2018-10-26 LAB — POTASSIUM: Potassium: 3.2 mmol/L — ABNORMAL LOW (ref 3.5–5.1)

## 2018-10-26 LAB — PHOSPHORUS
Phosphorus: 1.5 mg/dL — ABNORMAL LOW (ref 2.5–4.6)
Phosphorus: 2.9 mg/dL (ref 2.5–4.6)

## 2018-10-26 LAB — CK: Total CK: 849 U/L — ABNORMAL HIGH (ref 49–397)

## 2018-10-26 LAB — MAGNESIUM: Magnesium: 1.7 mg/dL (ref 1.7–2.4)

## 2018-10-26 MED ORDER — POTASSIUM CHLORIDE CRYS ER 20 MEQ PO TBCR
40.0000 meq | EXTENDED_RELEASE_TABLET | ORAL | Status: AC
  Administered 2018-10-26: 10:00:00 40 meq via ORAL
  Filled 2018-10-26: qty 2

## 2018-10-26 MED ORDER — MAGNESIUM SULFATE IN D5W 1-5 GM/100ML-% IV SOLN
1.0000 g | Freq: Once | INTRAVENOUS | Status: AC
  Administered 2018-10-26: 12:00:00 1 g via INTRAVENOUS
  Filled 2018-10-26: qty 100

## 2018-10-26 MED ORDER — CHLORDIAZEPOXIDE HCL 25 MG PO CAPS
25.0000 mg | ORAL_CAPSULE | Freq: Two times a day (BID) | ORAL | Status: DC
  Administered 2018-10-26 – 2018-10-28 (×5): 25 mg via ORAL
  Filled 2018-10-26 (×5): qty 1

## 2018-10-26 MED ORDER — POTASSIUM PHOSPHATES 15 MMOLE/5ML IV SOLN
40.0000 mmol | Freq: Once | INTRAVENOUS | Status: AC
  Administered 2018-10-26: 40 mmol via INTRAVENOUS
  Filled 2018-10-26: qty 13.33

## 2018-10-26 MED ORDER — LORAZEPAM 2 MG/ML IJ SOLN
2.0000 mg | INTRAMUSCULAR | Status: DC | PRN
  Administered 2018-10-26: 03:00:00 2 mg via INTRAVENOUS
  Administered 2018-10-26 (×3): 3 mg via INTRAVENOUS
  Filled 2018-10-26 (×2): qty 2
  Filled 2018-10-26: qty 1
  Filled 2018-10-26 (×2): qty 2

## 2018-10-26 MED ORDER — POTASSIUM CHLORIDE CRYS ER 20 MEQ PO TBCR: 40.0000 meq | EXTENDED_RELEASE_TABLET | Freq: Two times a day (BID) | ORAL | Status: DC

## 2018-10-26 MED ORDER — POTASSIUM CHLORIDE CRYS ER 20 MEQ PO TBCR
40.0000 meq | EXTENDED_RELEASE_TABLET | ORAL | Status: AC
  Administered 2018-10-26 (×2): 40 meq via ORAL
  Filled 2018-10-26 (×2): qty 2

## 2018-10-26 NOTE — Progress Notes (Signed)
Pt very anxious, confused, agitated, combative, and yelling, refused to stay in bed, it took 4 Nursing staff to get him back in bed, waist belt restrain initiated at [place], Dr Sharon Seller (on call) paged and informed, said will come see pt, will however continue to monitor. Obasogie-Asidi, Johanthan Kneeland Efe

## 2018-10-26 NOTE — Progress Notes (Signed)
3mg  ativan given at 1135 witnessed by B Radiographer, therapeutic and administered by [employer]. 1 MG wasted in sharps

## 2018-10-26 NOTE — Progress Notes (Addendum)
Subjective:  Gerron Guidotti is a 68 y.o. M with PMH of PMH of HTN, AUDadmit for tremors on hospital day 3  Mr Maland was examined at bedside and was found to be in soft restraints. He states that he "feels good." He denies feeling anxious, nervous. Also reports that his tremors have improved as well. He is tolerating oral intake without any difficulties. He states that overnight "he went to the grocery store at 9 and got a ride." He denies pain.   Had a prolonged conversation with his niece, Cowen Pesqueira, who was updated on overnight events. She states she was concerned about possible infectious etiology or stroke for his confusion. Explained in detail regarding negative evidence of infection or stroke and likely exacerbation of withdrawal symptoms. She mentions that family history of MS exists as well. She again mentions that she is unable to clarify exact amount of daily alcohol consumption for Mr.Cress. All other concerns addressed.  Objective:  Vital signs in last 24 hours: Vitals:   10/26/18 0200 10/26/18 0258 10/26/18 0324 10/26/18 0506  BP: (!) 183/94 (!) 162/82 (!) 143/87 128/70  Pulse:   86 75  Resp: (!) 25  (!) 22 14  Temp:    98.5 F (36.9 C)  TempSrc:    Oral  SpO2:   96% 99%  Weight:      Height:       Physical Exam  Constitutional: He is oriented to person, place, and time and well-developed, well-nourished, and in no distress. No distress.  Abdominal: Soft. Bowel sounds are normal. He exhibits no distension. There is no abdominal tenderness.  Genitourinary:    Genitourinary Comments: Foley bag in place with yellow urine   Musculoskeletal: Normal range of motion.        General: No edema.  Neurological: He is alert and oriented to person, place, and time.  Fine resting tremors  Skin: Skin is warm and dry.   Assessment/Plan:  Principal Problem:   Alcohol withdrawal ([place]) Active Problems:   Essential hypertension   Benign essential tremor   Visual  hallucination   Malnutrition of moderate degree   Urinary retention  Mr.Ledet is a 68 yo M w/ PMH of resistant hypertension admit for tremors. He had worsening withdrawal symptoms last night with agitation, hallucinations, diaphoresis, tremors. Possible alternative explanation is underlying dementia with sundowning. Received 9mg  of IV ativan overnight. Currently CIWA about 8 on my exam. Will continue to monitor while providing benzodiazepine.  Delirium Tremens Last 3 CIWA scores 21 -> 21 -> 21Last additional Ativan yesterday at 12pm.  Currently endorsing mild resting tremors but otherwise appears improved. May be able to tolerate more rapid taper. Received Librium four times daily yesterday. Will try BID today w/ close monitoring -C/wLibrium 25mg  BIDtoday - CIWA w/ Ativan 2-3mg  q1hr - C/w thiamine, folate supplementation  Hypokalemia, Hypophosphatemia 2/2 poor oral intake K 3.0, Phos 1.5 this am. Per nursing staff, patient has not been eating his meals. - Kphos IV 61meq  - Trend  Severe asymptomatic essential hypertension  BP150/89 this am.Started on lisinopril, metoprolol and amlodipine yesterday. Bp starting to come down with better control of his withdrawal symptoms. Will hold off on increaseing anti-hypertensive doses at this point.  - C/w amlodipine 5mg  daily, metoprolol tartrate 50mg  BID, lisinopril 10mg  daily  Drug-Induced Urinary Retention CK elevated at 1161 yesterday, trended down to 849 with fluids. Yellow urine in foley bag. Possible BPH component. - POC abd ultrasound today - C/w Foley - Monitor I&Os  DVT prophx:Lovenox Diet:Cardiac Code:Full  Dispo: Anticipated discharge in approximately 2-3 day(s).   [name], MD 10/26/2018, 6:38 AM Pager: [phone]

## 2018-10-26 NOTE — Progress Notes (Signed)
Paged for patient having increasing agitation, trying to get out of bed and physically fighting with nursing. CIWA earlier in the day around 4 and progressed to 17 this evening and continued after receiving Ativan. On seeing Dan Henry, he is hallucinating and thinks he is at the [employer] and that the year is 1920. He is oriented to name. He states that he needs to leave to go home. He denies pain, nausea or shortness of breath. He does not realize he is at the hospital or why he is here. On physical exam patient is tremulous, although he does have a history of essential tremor. He is tachycardic to 125. Temperature 98.3. Saturations are 99% on room air. Foley catheter is in place due to recent retention, possibly 2/2 benzos. He is having adequate Uop with 1000cc total today. He is moving all extremities. Abd is NTTP, nondistended. PERRLA. He is not redirectable and unable to complete CN exam at this time although this appears grossly intact.  He is currently admitted for alcohol withdrawal and was having hallucinations at admission. He is being treated with librium taper and ativan prn with improvements in cognition earlier in the day. He endorsed last alcohol use as two days prior to admission on 6/23, although alcohol level was 103. I am concerned that he may previously have been undergoing alcohol hallucinosis as he was aware he was hallucinating and may now be beginning to have DTs. He does have history of alcohol withdrawal, but no history of seizures.   - increase CIWA to stepdown with ativan 2-3 mg q1h prn based on CIWA  - sitter ordered  - would like to avoid restraints with recent CK  - will draw CK, CMP, CBC early  - continue to monitor

## 2018-10-27 LAB — BASIC METABOLIC PANEL
Anion gap: 12 (ref 5–15)
BUN: 14 mg/dL (ref 8–23)
CO2: 20 mmol/L — ABNORMAL LOW (ref 22–32)
Calcium: 9.2 mg/dL (ref 8.9–10.3)
Chloride: 105 mmol/L (ref 98–111)
Creatinine, Ser: 1.07 mg/dL (ref 0.61–1.24)
GFR calc Af Amer: >60 mL/min (ref >60)
GFR calc non Af Amer: >60 mL/min (ref >60)
Glucose, Bld: 100 mg/dL — ABNORMAL HIGH (ref 70–99)
Potassium: 4.2 mmol/L (ref 3.5–5.1)
Sodium: 137 mmol/L (ref 135–145)

## 2018-10-27 LAB — MAGNESIUM: Magnesium: 2.1 mg/dL (ref 1.7–2.4)

## 2018-10-27 LAB — PHOSPHORUS: Phosphorus: 5.2 mg/dL — ABNORMAL HIGH (ref 2.5–4.6)

## 2018-10-27 NOTE — Progress Notes (Signed)
   Subjective:  Dan Henry is a 68 y.o. M with PMH of alcohol use disorder and hypertension admit for  on hospital day 4  Dan Henry was examined and evaluated at bedside this AM. He is AAOx3 to self, location and date and states he feels fine. He was observed continuing to have mild tremors. When asked about overnight events, he states he does know he had an episode of confusion but he is alright now.  Spoke with RN who mentions that he was noted repeatedly asking for 'beer and weed' overnight.  Objective:  Vital signs in last 24 hours: Vitals:   10/27/18 0000 10/27/18 0100 10/27/18 0400 10/27/18 0750  BP: 105/72  124/75 (!) 141/116  Pulse:   67 77  Resp:  (!) 22  17  Temp: 98.3 F (36.8 C)  98.2 F (36.8 C)   TempSrc: Oral  Oral   SpO2:    99%  Weight:      Height:       Physical Exam  Constitutional: He is oriented to person, place, and time.  Thin-appearing  Abdominal: Soft. Bowel sounds are normal. He exhibits no distension. There is no abdominal tenderness.  Musculoskeletal: Normal range of motion.        General: No edema.  Neurological: He is alert and oriented to person, place, and time.  Faint resting tremors  Skin: Skin is warm and dry.  Psychiatric:  Flat affect   Assessment/Plan:  Principal Problem:   Alcohol withdrawal (HCC) Active Problems:   Essential hypertension   Benign essential tremor   Visual hallucination   Malnutrition of moderate degree   Urinary retention   Non-traumatic rhabdomyolysis  Dan Henry is a 68 yo M w/ PMH of resistant hypertension admit for tremors. He had episodes of disorientation overnight but at this point appears to at the end of his withdrawal symptoms. Most likely will be ready for discharge today. Unfortunately he has expressed no interest in alcohol cessation at this time and his continued alcohol use will result in severe medical complications in the long term. He will need continued encouragement from his support  group to encourage alcohol cessation at home.  Delirium Tremens Last 3 CIWA scores 12->12->12 with disorientation but redirectable. Last additional Ativan yesterday at 11am 3mg .CIWA score of 7 on my exam -C/wLibrium 25mg BID - CIWA w/ Ativan 2-3mg  q1hr - C/w thiamine, folate supplementation - Likely discharge home today  Hypokalemia, Hypophosphatemia 2/2 poor oral intake K 3.0->4.2, Phos 1.5->5.2 this am with IV and oral repletion - Resolved  Drug-Induced Urinary Retention POC abd ultrasound showed no obvious BPH but poor visualization. Endorsed good urinary output with foley  -D/cFoley, post-void trial - Monitor I&Os  DVT prophx:Lovenox Diet:Cardiac Code:Full  Dispo: Anticipated discharge in approximately 0-1 day(s).   Dan Anis, MD 10/27/2018, 8:44 AM Pager: [phone]

## 2018-10-27 NOTE — Progress Notes (Signed)
Pt had one episode at approx 0330 of attempting to get out of bed. He stated that he had to go home to take the trash out. He was agreeable to going back to bed, but stated that when it became light outside he was going home.

## 2018-10-28 DIAGNOSIS — R338 Other retention of urine: Secondary | ICD-10-CM

## 2018-10-28 DIAGNOSIS — N401 Enlarged prostate with lower urinary tract symptoms: Secondary | ICD-10-CM

## 2018-10-28 DIAGNOSIS — R821 Myoglobinuria: Secondary | ICD-10-CM

## 2018-10-28 LAB — RENAL FUNCTION PANEL
Albumin: 3.6 g/dL (ref 3.5–5.0)
Anion gap: 11 (ref 5–15)
BUN: 18 mg/dL (ref 8–23)
CO2: 23 mmol/L (ref 22–32)
Calcium: 9.5 mg/dL (ref 8.9–10.3)
Chloride: 104 mmol/L (ref 98–111)
Creatinine, Ser: 1.19 mg/dL (ref 0.61–1.24)
GFR calc Af Amer: >60 mL/min (ref >60)
GFR calc non Af Amer: >60 mL/min (ref >60)
Glucose, Bld: 97 mg/dL (ref 70–99)
Phosphorus: 4.4 mg/dL (ref 2.5–4.6)
Potassium: 4.2 mmol/L (ref 3.5–5.1)
Sodium: 138 mmol/L (ref 135–145)

## 2018-10-28 MED ORDER — TAMSULOSIN HCL 0.4 MG PO CAPS
0.4000 mg | ORAL_CAPSULE | Freq: Once | ORAL | Status: AC
  Administered 2018-10-28: 11:00:00 0.4 mg via ORAL
  Filled 2018-10-28: qty 1

## 2018-10-28 MED ORDER — ENSURE ENLIVE PO LIQD: 237.0000 mL | Freq: Two times a day (BID) | ORAL | 12 refills | Status: DC

## 2018-10-28 MED ORDER — TAMSULOSIN HCL 0.4 MG PO CAPS: 0.4000 mg | ORAL_CAPSULE | Freq: Every day | ORAL | 0 refills | Status: DC

## 2018-10-28 MED ORDER — NALTREXONE HCL 50 MG PO TABS: 50.0000 mg | ORAL_TABLET | Freq: Every day | ORAL | 0 refills | Status: DC

## 2018-10-28 MED ORDER — CHLORDIAZEPOXIDE HCL 25 MG PO CAPS: 25.0000 mg | ORAL_CAPSULE | Freq: Every day | ORAL | 0 refills | Status: AC

## 2018-10-28 NOTE — Progress Notes (Signed)
   Subjective:  Dan Henry is a 68 y.o. M with PMH of alcohol use disorder, htn admit for tremors on hospital day 5  Dan Henry was examined and evaluated at bedside chair this am. He was observed eating breakfast this morning. He states he is ready to go. Discussed regarding importance of avoiding alcohol use and risk of reoccurrence of his symptoms if he resumes alcohol use. He states he is willing to quit his alcohol use at this time. Discussed option of starting naltrexone to reduce alcohol use cravings. Dan Henry agreed. Denies any nausea, vomiting, abdominal pain. He states he did urinate yesterday but only a small amount.  Also had a prolonged conversation with niece, Dan Henry, regarding concern for medication non-adherence at home and recurrence of alcohol use disorder. She states she is planning on getting him to stay with his sister so that she can care for them together and requests home health nursing. She mentions she had home health set up and asks if it needs to be set up. Re-assured her that the order should stand and will f/u with PCP regarding the issue.  Objective:  Vital signs in last 24 hours: Vitals:   10/27/18 2305 10/28/18 0405 10/28/18 0630 10/28/18 0805  BP: 103/70 111/68 136/86 114/87  Pulse: 65 79 70 66  Resp: 14 (!) 21  14  Temp:  98 F (36.7 C)    TempSrc:  Oral    SpO2: 99%   100%  Weight:      Height:       Gen: NAD Abd: Soft, BS+, Distended, non-tender Extm: ROM intact, Peripheral pulses intact, No peripheral edema Skin: Dry, Warm  Neuro: AAOx4, no gross tremors but perceptible on palpation Psych: Normal mood and affect  Assessment/Plan:  Principal Problem:   Alcohol withdrawal (HCC) Active Problems:   Essential hypertension   Benign essential tremor   Visual hallucination   Malnutrition of moderate degree   Urinary retention  Dan Henry is a 68 yo M w/ PMH of resistant hypertension admit for tremors. No acute event overnight.  AAOx4. Appears to be more motivated to pursue alcohol cessation this morning. Will discharge home with naltrexone and 2 more doses of librium for taper.  Delirium Tremens Last 3 CIWA scores 12->4->3. Barely perceptable tremors. Last additional Ativan 6/26 at 11am 3mg .CIWA score of 2 on my exam - Discharge home today with naltrexone and 2 more doses of Librium  Drug-Induced Urinary Retention Foley removed yesterday without complication. 650cc output yesterday. Distended abdomen but not tender. -Bladder scan, straigth cath if full - Monitor I&Os - Should resolve after finishing taper of Librium  DVT prophx:Lovenox Diet:Cardiac Code:Full  Dispo: Anticipated discharge in approximately today(s).   Dan Anis, MD 10/28/2018, 10:11 AM Pager: [phone]

## 2018-10-28 NOTE — TOC Transition Note (Signed)
Transition of Care [facility]) - CM/SW Discharge Note   Patient Details  Name: Dan Henry MRN: [id number] Date of Birth: Apr 04, 1951  Transition of Care [facility]) CM/SW Contact:  Carles Collet, RN Phone Number: 10/28/2018, 11:29 AM   Clinical Narrative:    Patient active w [facility] for nursing. He is agreeable to continue service after DC. Resumption order placed and referral made. No DME needs. Patient states he will stay with his sister for the next two days before returning home. He states that she will provide transport home    Final next level of care: Home w [place] Barriers to Discharge: No Barriers Identified   Patient Goals and CMS Choice Patient states their goals for this hospitalization and ongoing recovery are:: to return home   Choice offered to / list presented to : NA  Discharge Placement                       Discharge Plan and Services                          HH Arranged: RN [facility] Agency: [place] ([facility]) Date [facility] Agency Contacted: 10/28/18 Time [place]: 1128 Representative spoke with at [place]: [place] ([place]) Interventions     Readmission Risk Interventions No flowsheet data found.

## 2018-10-28 NOTE — Plan of Care (Signed)
Adequate for discharge.

## 2018-10-28 NOTE — Discharge Summary (Signed)
Name: Dan Henry MRN: [id number] DOB: 28-Aug-1950 68 y.o. PCP: [name], MD  Date of Admission: 10/23/2018 10:41 AM Date of Discharge: 10/28/2018 Attending Physician: Axel Filler, *  Discharge Diagnosis: 1. Delirium Tremens 2. Urinary Retention 3. Myoglobinuria 4. Hypophosphatemia 5. Hypokalemia  Discharge Medications: Allergies as of 10/28/2018   No Known Allergies     Medication List    STOP taking these medications   acetaminophen 500 MG tablet Commonly known as: TYLENOL   aspirin EC 325 MG tablet   aspirin-acetaminophen-caffeine 250-250-65 MG tablet Commonly known as: EXCEDRIN MIGRAINE   chlorthalidone 50 MG tablet Commonly known as: HYGROTON   spironolactone 25 MG tablet Commonly known as: ALDACTONE     TAKE these medications   amLODipine 10 MG tablet Commonly known as: NORVASC TAKE 1 TABLET BY MOUTH ONCE DAILY   atorvastatin 40 MG tablet Commonly known as: LIPITOR Take 1 tablet (40 mg total) by mouth daily. What changed: when to take this   chlordiazePOXIDE 25 MG capsule Commonly known as: LIBRIUM Take 1 capsule (25 mg total) by mouth at bedtime for 2 doses. Start taking on: October 29, 2018   feeding supplement (ENSURE ENLIVE) Liqd Take 237 mLs by mouth 2 (two) times daily between meals.   lisinopril 10 MG tablet Commonly known as: ZESTRIL Take 1 tablet (10 mg total) by mouth daily.   metoprolol tartrate 50 MG tablet Commonly known as: LOPRESSOR Take 1 tablet (50 mg total) by mouth 2 (two) times daily.   multivitamin Tabs tablet Take 1 tablet by mouth daily.   naltrexone 50 MG tablet Commonly known as: DEPADE Take 1 tablet (50 mg total) by mouth daily.   tamsulosin 0.4 MG Caps capsule Commonly known as: FLOMAX Take 1 capsule (0.4 mg total) by mouth daily.       Disposition and follow-up:   [name] was discharged from [facility] in Stable condition.  At the hospital follow up visit please  address:  1. Delirium Tremens - Ensure he finishes his Librium taper appropriately (1 more dose on night of discharge, 1 more dose the next day at night time) - Confirm that he takes his naltrexone as prescribed - Continue to encourage alcohol cessation - Assess for withdrawal symptoms  2. Urinary Retention - Drug-induced due to benzos + BPH - Started on tamsulosin at discharge - Consider further work-up if appropriate  3. Myoglobinuria - Consider Bmp to check renal function  4. Hypophosphatemia - Encourage good nutrition at home  5. Hypokalemia - Consider Bmp to check stable potassium  2.  Labs / imaging needed at time of follow-up: BMP  3.  Pending labs/ test needing follow-up: N/A  Follow-up Appointments: Follow-up Information    [name], MD. Call.   Specialty: Internal Medicine Contact information: [address] [address] [place] Hospital Course by problem list: 1. Delirium Tremens: [name] is a 68 yo M w/ PMH of alcohol use disorder and HTN who presented to [facility] w/ complaints of tremors and hallucinations. On admission he was found to have alcohol level of 105 and was started on scheduled Librium and put on CIWA protocol with IV Ativan as needed. Throughout the hospital course he had intermittent episodes of tremors, anxiety, disorientation and hallucinations. On hospital day his symptoms had completely resolved and he was observed eating and drinking without difficulty. He was discharged on 1 more day of  Librium taper and naloxone with recommendation to cease alcohol use at home.  2. Urinary Retention: On hospital day 2, he was noted to have significantly distended bladder. Presumed to be drug induced due to anti-cholinergic and benzodiazepine use, foley catheter was placed after a failed voiding trial. POC ultrasound of his abdomen was performed with difficulty visualized his prostate size. Foley catheter was taken  out on hospital day 5 as his benzodiazepine doses were taped. On day of discharge he failed another voiding trial and was given tamsulosin with good urinary output. Discharged on tamsulosin with recommendation to f/u with PCP.  3. Myoglobinuria: On admission he was noted to have tea-colored urine. UA showed hemoglobin and 0-5 red blood cells. CK was elevated at 1161. He was given IV fluid resuscitation. CK trended down to 849 and his urine turned to yellow color. Discharged with recommendation for adequate hydration at home.  4. Hypophosphatemia / Hypokalemia: Throughout the hospital course, noted to have intermittent drop in phosphorous and potassium as he ate due to refeeding syndrome. Both electrolytes were replete with IV and oral formulations. At discharge, potassium was at 4.2. Phosphorous was at 4.4. Discharged with recommendation to pursue good nutrition at home.  Discharge Vitals:   BP 114/87 (BP Location: Left Arm)   Pulse 66   Temp 98 F (36.7 C) (Oral)   Resp 14   Ht 5\' 4"  (1.626 m)   Wt 54.6 kg   SpO2 100%   BMI 20.66 kg/m   Pertinent Labs, Studies, and Procedures:  BMP Latest Ref Rng & Units 10/28/2018 10/27/2018 10/26/2018  Glucose 70 - 99 mg/dL 97 100(H) -  BUN 8 - 23 mg/dL 18 14 -  Creatinine 0.61 - 1.24 mg/dL 1.19 1.07 -  BUN/Creat Ratio 10 - 24 - - -  Sodium 135 - 145 mmol/L 138 137 -  Potassium 3.5 - 5.1 mmol/L 4.2 4.2 3.2(L)  Chloride 98 - 111 mmol/L 104 105 -  CO2 22 - 32 mmol/L 23 20(L) -  Calcium 8.9 - 10.3 mg/dL 9.5 9.2 -   Discharge Instructions: Discharge Instructions    Call MD for:  persistant dizziness or light-headedness   Complete by: As directed    Call MD for:  persistant nausea and vomiting   Complete by: As directed    Call MD for:  redness, tenderness, or signs of infection (pain, swelling, redness, odor or green/yellow discharge around incision site)   Complete by: As directed    Call MD for:  temperature >100.4   Complete by: As directed     Diet - low sodium heart healthy   Complete by: As directed    Discharge instructions   Complete by: As directed    Dear Dan Henry  You came to [place] with tremors and hallucinations. We have determined this was caused by alcohol withdrawal. Here are our recommendations for you at discharge:  Please try to avoid any and all alcohol use Please start naltrexone 50mg  daily Please take Librium 1 tablets at night time today and tomorrow Please stop taking your chlorthalidone and spironolactone Please take your metoprolol, amlodipine and lisinopril as prescribed Please follow up with the internal medicine clinic  Thank you for choosing [facility].   Increase activity slowly   Complete by: As directed       Signed: [name], MD 10/28/2018, 2:06 PM   Pager: [phone]

## 2018-10-29 ENCOUNTER — Telehealth: Payer: Self-pay

## 2018-10-29 NOTE — Telephone Encounter (Signed)
I thought the case manager from the hospital put in the order and referral for home health service (for medication management) to resume

## 2018-10-29 NOTE — Telephone Encounter (Signed)
Pt's niece requesting to speak with a nurse about home health services and PCS. Please call back.

## 2018-10-29 NOTE — Telephone Encounter (Signed)
I returned phone call to patients niece,Michelle about Home Health services for patient.The patient was just released from the hospital.. This patient has used [facility] before. I will need to get in touch with his physician about what orders the patient needs [place], [place] C6/29/202012:13 PM

## 2018-11-01 NOTE — Telephone Encounter (Signed)
Yes, the orders were placed during patient's hospital admission. Hubbard Hartshorn, RN, BSN

## 2018-11-02 DIAGNOSIS — R339 Retention of urine, unspecified: Secondary | ICD-10-CM | POA: Diagnosis not present

## 2018-11-02 DIAGNOSIS — G250 Essential tremor: Secondary | ICD-10-CM | POA: Diagnosis not present

## 2018-11-02 DIAGNOSIS — I10 Essential (primary) hypertension: Secondary | ICD-10-CM | POA: Diagnosis not present

## 2018-11-02 DIAGNOSIS — D649 Anemia, unspecified: Secondary | ICD-10-CM | POA: Diagnosis not present

## 2018-11-02 DIAGNOSIS — E785 Hyperlipidemia, unspecified: Secondary | ICD-10-CM | POA: Diagnosis not present

## 2018-11-02 DIAGNOSIS — F10231 Alcohol dependence with withdrawal delirium: Secondary | ICD-10-CM | POA: Diagnosis not present

## 2018-11-02 DIAGNOSIS — E440 Moderate protein-calorie malnutrition: Secondary | ICD-10-CM | POA: Diagnosis not present

## 2018-11-05 ENCOUNTER — Telehealth: Payer: Self-pay

## 2018-11-05 NOTE — Telephone Encounter (Signed)
Hi Stacee,   That should be fine. Thanks

## 2018-11-05 NOTE — Telephone Encounter (Signed)
Received TC from Ms. Rod Can, [place] with [facility] requesting VO for  PT evaluation  States RN w/ [facility] was at pt's home on Friday and noted pt was "unsteady when Ambulating". VO given. Will forward to PCP for agreement or denial. SChaplin, RN,BSN

## 2018-11-07 ENCOUNTER — Encounter: Payer: Self-pay | Admitting: Internal Medicine

## 2018-11-07 ENCOUNTER — Other Ambulatory Visit: Payer: Self-pay

## 2018-11-07 ENCOUNTER — Ambulatory Visit (INDEPENDENT_AMBULATORY_CARE_PROVIDER_SITE_OTHER): Payer: Medicare HMO | Admitting: Internal Medicine

## 2018-11-07 DIAGNOSIS — Z7289 Other problems related to lifestyle: Secondary | ICD-10-CM

## 2018-11-07 DIAGNOSIS — Z9111 Patient's noncompliance with dietary regimen: Secondary | ICD-10-CM

## 2018-11-07 DIAGNOSIS — F10230 Alcohol dependence with withdrawal, uncomplicated: Secondary | ICD-10-CM

## 2018-11-07 DIAGNOSIS — Z79899 Other long term (current) drug therapy: Secondary | ICD-10-CM

## 2018-11-07 DIAGNOSIS — I10 Essential (primary) hypertension: Secondary | ICD-10-CM | POA: Diagnosis not present

## 2018-11-07 DIAGNOSIS — F10930 Alcohol use, unspecified with withdrawal, uncomplicated: Secondary | ICD-10-CM

## 2018-11-07 DIAGNOSIS — Z9114 Patient's other noncompliance with medication regimen: Secondary | ICD-10-CM

## 2018-11-07 MED ORDER — METOPROLOL TARTRATE 50 MG PO TABS: 50.0000 mg | ORAL_TABLET | Freq: Two times a day (BID) | ORAL | 5 refills | Status: DC

## 2018-11-07 NOTE — Progress Notes (Signed)
   CC: Follow up hypertension   HPI:  [name] is a 68 y.o. very pleasant African-American gentleman with medical history listed below presenting to follow-up on hypertension after recent admission to the hospital.  Please see problem based charting for further details.  Active Ambulatory Problems    Diagnosis Date Noted  . Essential hypertension 05/21/2015  . Healthcare maintenance 05/21/2015  . Right carotid bruit 05/08/2017  . Elevated LFTs 05/16/2017  . ASCVD (arteriosclerotic cardiovascular disease) 05/16/2017  . Hypokalemia 07/19/2017  . Benign essential tremor 07/19/2017  . Unintentional weight loss 02/16/2018  . Increased anion gap metabolic acidosis 02/40/9735  . Need for hepatitis B vaccination 02/21/2018  . Visual hallucination 06/26/2018  . Abnormal TSH 06/26/2018  . Hepatic hemangioma 10/19/2018  . Alcohol withdrawal ([place]) 10/23/2018  . Malnutrition of moderate degree 10/24/2018  . Urinary retention 10/25/2018   Resolved Ambulatory Problems    Diagnosis Date Noted  . AKI (acute kidney injury) ([place]) 05/08/2017  . Non-traumatic rhabdomyolysis 10/26/2018   Past Medical History:  Diagnosis Date  . Hypertension     Review of Systems:  As per HPI  Physical Exam:  Vitals:   11/07/18 1356  BP: (!) 181/84  Pulse: 70  Temp: 98 F (36.7 C)  TempSrc: Oral  SpO2: 100%  Weight: 129 lb 12.8 oz (58.9 kg)  Height: 5\' 4"  (1.626 m)   Physical Exam Vitals signs and nursing note reviewed.  Constitutional:      Appearance: He is normal weight.  HENT:     Head: Normocephalic and atraumatic.  Neck:     Musculoskeletal: Neck supple.  Cardiovascular:     Rate and Rhythm: Normal rate and regular rhythm.     Pulses: Normal pulses.     Heart sounds: No murmur. No gallop.   Pulmonary:     Effort: Pulmonary effort is normal.     Breath sounds: Normal breath sounds. No wheezing, rhonchi or rales.  Musculoskeletal:     Right lower leg: No edema.     Left  lower leg: No edema.  Neurological:     Mental Status: He is alert.     Assessment & Plan:   See Encounters Tab for problem based charting.  Patient discussed with Dr. Dareen Piano

## 2018-11-07 NOTE — Assessment & Plan Note (Signed)
EtOH use disorder: Dan Henry reports that he has not consumed alcohol since being discharged from the hospital and is compliant with naltrexone 50 mg daily.  He does not report hallucination.  Plan: -Congratulated him on alcohol cessation -Continue naltrexone 50 mg daily

## 2018-11-07 NOTE — Progress Notes (Deleted)
   CC: ***  HPI:  [name] is a 68 y.o.   Past Medical History:  Diagnosis Date  . Hypertension    Review of Systems:  ***  Physical Exam:  There were no vitals filed for this visit. ***  Assessment & Plan:   See Encounters Tab for problem based charting.  Patient {GC/GE:[id number]::"discussed with","seen with"} Dr. {NAMES:[id number]::"Butcher","Granfortuna","E. Hoffman","Mullen","Narendra","Raines","Vincent"}

## 2018-11-07 NOTE — Assessment & Plan Note (Signed)
Hypertension: Previously poorly controlled on multiple blood pressure medications including spironolactone, chlorthalidone, amlodipine, lisinopril and Lopressor.  This was thought to be multifactorial due to dietary indiscretion, alcohol use and medication noncompliance.  He was recently admitted to the hospital from October 23, 2018 to  October 28, 2018 with delirium tremons.  During his stay his blood pressure were better controlled on only 3 of his antihypertensives.  At discharge, his blood pressure was 114/87.  And he was subsequently discharged on amlodipine 10 mg daily, lisinopril 10 mg daily and metoprolol tartrate 50 mg twice daily.  Given that he was found to have urinary retention due to benzodiazepine, he was also discharged on tamsulosin 0.4 mg daily.  Today, he tells me he does not monitor his blood pressure at home however on Friday during a home health visit, his blood pressure was 164/90.  He denies chest pain, shortness of breath, headaches, dizziness, lightheadedness.  His BP today is 181/84.  And he reports compliance to his antihypertensives.  In regards to his diet, he tells me he does not consume a lot of salt products or processed foods.  However, I did advise him on cessation of food high in salt content.  Plan: -Continue amlodipine 10 mg daily, lisinopril 10 mg daily, Lopressor 50 mg twice daily, tamsulosin 0.4 mg daily -Advised to purchase blood pressure cuff and record BP for the next 2 weeks. -I will reach out in 2 weeks to discuss BP management. -If BP remains elevated, will increase lisinopril. -Continue to hold spironolactone 25 mg daily and chlorthalidone 50 mg daily

## 2018-11-07 NOTE — Patient Instructions (Signed)
Hi Dan Henry,   It was a pleasure taking care of you here at the clinic today.  I am very glad to hear that you are doing well.  Your blood pressure was elevated here today.  Here my recommendations after today's visit.  1.  Continue taking the 3 blood pressure medications that you were discharged with from the hospital (amlodipine 10 mg daily, lisinopril 10 mg daily, Lopressor 50 mg twice daily).  2.  I would advise that you cut back on salt and processed foods.  3.  Like we discussed, I would like for you to purchase the blood pressure machine and keep a log of your blood pressures.  I will give you a call in about 2 weeks to check up.  Take Care! Dr. Eileen Stanford

## 2018-11-08 ENCOUNTER — Telehealth: Payer: Self-pay | Admitting: *Deleted

## 2018-11-08 NOTE — Progress Notes (Signed)
Internal Medicine Clinic Attending  Case discussed with Dr. Agyei at the time of the visit.  We reviewed the resident's history and exam and pertinent patient test results.  I agree with the assessment, diagnosis, and plan of care documented in the resident's note.    

## 2018-11-08 NOTE — Telephone Encounter (Signed)
HH PT did eval today will see 1 more time in 2 weeks for f/u and possible closure of PT, VO given, do you agree?

## 2018-11-08 NOTE — Telephone Encounter (Signed)
Agree 

## 2018-11-19 ENCOUNTER — Telehealth: Payer: Self-pay

## 2018-11-19 NOTE — Telephone Encounter (Signed)
Kiouna with [facility] want to informed the office, pt had a missed visit last week for skilled nursing.

## 2018-11-19 NOTE — Telephone Encounter (Signed)
noted 

## 2019-01-07 ENCOUNTER — Other Ambulatory Visit: Payer: Self-pay | Admitting: Internal Medicine

## 2019-01-07 DIAGNOSIS — I10 Essential (primary) hypertension: Secondary | ICD-10-CM

## 2019-01-09 ENCOUNTER — Other Ambulatory Visit: Payer: Self-pay | Admitting: Internal Medicine

## 2019-02-14 ENCOUNTER — Other Ambulatory Visit: Payer: Self-pay

## 2019-02-14 ENCOUNTER — Ambulatory Visit (INDEPENDENT_AMBULATORY_CARE_PROVIDER_SITE_OTHER): Payer: Medicare HMO | Admitting: Internal Medicine

## 2019-02-14 ENCOUNTER — Encounter: Payer: Self-pay | Admitting: Internal Medicine

## 2019-02-14 ENCOUNTER — Inpatient Hospital Stay (HOSPITAL_COMMUNITY)
Admission: AD | Admit: 2019-02-14 | Discharge: 2019-02-19 | DRG: 897 | Disposition: A | Discharge status: Home / Self Care | Payer: Medicare HMO | Source: Ambulatory Visit | Attending: Internal Medicine | Admitting: Internal Medicine

## 2019-02-14 VITALS — BP 153/81 | HR 98 | Temp 99.5°F | Resp 20 | Wt 121.1 lb

## 2019-02-14 DIAGNOSIS — Z79899 Other long term (current) drug therapy: Secondary | ICD-10-CM

## 2019-02-14 DIAGNOSIS — Z9114 Patient's other noncompliance with medication regimen: Secondary | ICD-10-CM | POA: Diagnosis not present

## 2019-02-14 DIAGNOSIS — N179 Acute kidney failure, unspecified: Secondary | ICD-10-CM | POA: Diagnosis not present

## 2019-02-14 DIAGNOSIS — F10932 Alcohol use, unspecified with withdrawal with perceptual disturbance: Secondary | ICD-10-CM

## 2019-02-14 DIAGNOSIS — I10 Essential (primary) hypertension: Secondary | ICD-10-CM | POA: Diagnosis present

## 2019-02-14 DIAGNOSIS — F10231 Alcohol dependence with withdrawal delirium: Secondary | ICD-10-CM | POA: Diagnosis not present

## 2019-02-14 DIAGNOSIS — N400 Enlarged prostate without lower urinary tract symptoms: Secondary | ICD-10-CM | POA: Diagnosis present

## 2019-02-14 DIAGNOSIS — E440 Moderate protein-calorie malnutrition: Secondary | ICD-10-CM | POA: Diagnosis present

## 2019-02-14 DIAGNOSIS — G250 Essential tremor: Secondary | ICD-10-CM | POA: Diagnosis not present

## 2019-02-14 DIAGNOSIS — F10931 Alcohol use, unspecified with withdrawal delirium: Secondary | ICD-10-CM | POA: Diagnosis present

## 2019-02-14 DIAGNOSIS — Z823 Family history of stroke: Secondary | ICD-10-CM

## 2019-02-14 DIAGNOSIS — Z20828 Contact with and (suspected) exposure to other viral communicable diseases: Secondary | ICD-10-CM | POA: Diagnosis present

## 2019-02-14 DIAGNOSIS — R441 Visual hallucinations: Secondary | ICD-10-CM | POA: Diagnosis present

## 2019-02-14 DIAGNOSIS — E876 Hypokalemia: Secondary | ICD-10-CM | POA: Diagnosis present

## 2019-02-14 DIAGNOSIS — F10232 Alcohol dependence with withdrawal with perceptual disturbance: Secondary | ICD-10-CM

## 2019-02-14 DIAGNOSIS — F419 Anxiety disorder, unspecified: Secondary | ICD-10-CM | POA: Diagnosis present

## 2019-02-14 DIAGNOSIS — R251 Tremor, unspecified: Secondary | ICD-10-CM | POA: Diagnosis present

## 2019-02-14 DIAGNOSIS — Z800 Family history of malignant neoplasm of digestive organs: Secondary | ICD-10-CM | POA: Diagnosis not present

## 2019-02-14 DIAGNOSIS — Z8249 Family history of ischemic heart disease and other diseases of the circulatory system: Secondary | ICD-10-CM | POA: Diagnosis not present

## 2019-02-14 DIAGNOSIS — F10239 Alcohol dependence with withdrawal, unspecified: Secondary | ICD-10-CM | POA: Diagnosis not present

## 2019-02-14 HISTORY — DX: Alcohol use, unspecified with withdrawal delirium: F10.931

## 2019-02-14 HISTORY — DX: Alcohol dependence with withdrawal delirium: F10.231

## 2019-02-14 LAB — CBC WITH DIFFERENTIAL/PLATELET
Abs Immature Granulocytes: 0.04 10*3/uL (ref 0.00–0.07)
Basophils Absolute: 0.1 10*3/uL (ref 0.0–0.1)
Basophils Relative: 1 %
Eosinophils Absolute: 0 10*3/uL (ref 0.0–0.5)
Eosinophils Relative: 0 %
HCT: 32.1 % — ABNORMAL LOW (ref 39.0–52.0)
Hemoglobin: 10.5 g/dL — ABNORMAL LOW (ref 13.0–17.0)
Immature Granulocytes: 1 %
Lymphocytes Relative: 9 %
Lymphs Abs: 0.6 10*3/uL — ABNORMAL LOW (ref 0.7–4.0)
MCH: 29.6 pg (ref 26.0–34.0)
MCHC: 32.7 g/dL (ref 30.0–36.0)
MCV: 90.4 fL (ref 80.0–100.0)
Monocytes Absolute: 0.4 10*3/uL (ref 0.1–1.0)
Monocytes Relative: 6 %
Neutro Abs: 5.5 10*3/uL (ref 1.7–7.7)
Neutrophils Relative %: 83 %
Platelets: 280 10*3/uL (ref 150–400)
RBC: 3.55 MIL/uL — ABNORMAL LOW (ref 4.22–5.81)
RDW: 17.1 % — ABNORMAL HIGH (ref 11.5–15.5)
WBC: 6.5 10*3/uL (ref 4.0–10.5)
nRBC: 0 % (ref 0.0–0.2)

## 2019-02-14 LAB — RAPID URINE DRUG SCREEN, HOSP PERFORMED
Amphetamines: NOT DETECTED
Barbiturates: NOT DETECTED
Benzodiazepines: NOT DETECTED
Cocaine: NOT DETECTED
Opiates: NOT DETECTED
Tetrahydrocannabinol: NOT DETECTED

## 2019-02-14 LAB — ETHANOL: Alcohol, Ethyl (B): <10 mg/dL (ref <10)

## 2019-02-14 LAB — COMPREHENSIVE METABOLIC PANEL
ALT: 72 U/L — ABNORMAL HIGH (ref 0–44)
AST: 88 U/L — ABNORMAL HIGH (ref 15–41)
Albumin: 4.5 g/dL (ref 3.5–5.0)
Alkaline Phosphatase: 67 U/L (ref 38–126)
Anion gap: 14 (ref 5–15)
BUN: 12 mg/dL (ref 8–23)
CO2: 25 mmol/L (ref 22–32)
Calcium: 9.4 mg/dL (ref 8.9–10.3)
Chloride: 105 mmol/L (ref 98–111)
Creatinine, Ser: 0.96 mg/dL (ref 0.61–1.24)
GFR calc Af Amer: >60 mL/min (ref >60)
GFR calc non Af Amer: >60 mL/min (ref >60)
Glucose, Bld: 159 mg/dL — ABNORMAL HIGH (ref 70–99)
Potassium: 4 mmol/L (ref 3.5–5.1)
Sodium: 144 mmol/L (ref 135–145)
Total Bilirubin: 1 mg/dL (ref 0.3–1.2)
Total Protein: 8.3 g/dL — ABNORMAL HIGH (ref 6.5–8.1)

## 2019-02-14 LAB — PHOSPHORUS: Phosphorus: 3.3 mg/dL (ref 2.5–4.6)

## 2019-02-14 LAB — MAGNESIUM: Magnesium: 1.4 mg/dL — ABNORMAL LOW (ref 1.7–2.4)

## 2019-02-14 MED ORDER — LORAZEPAM 1 MG PO TABS
1.0000 mg | ORAL_TABLET | ORAL | Status: AC | PRN
  Administered 2019-02-15 – 2019-02-16 (×3): 1 mg via ORAL
  Filled 2019-02-14 (×4): qty 1

## 2019-02-14 MED ORDER — CHLORDIAZEPOXIDE HCL 25 MG PO CAPS
25.0000 mg | ORAL_CAPSULE | Freq: Three times a day (TID) | ORAL | Status: DC
  Administered 2019-02-14: 25 mg via ORAL
  Filled 2019-02-14 (×2): qty 1

## 2019-02-14 MED ORDER — LORAZEPAM 2 MG/ML IJ SOLN
1.0000 mg | INTRAMUSCULAR | Status: AC | PRN
  Administered 2019-02-15 – 2019-02-16 (×4): 2 mg via INTRAVENOUS
  Filled 2019-02-14 (×4): qty 1

## 2019-02-14 MED ORDER — ACETAMINOPHEN 325 MG PO TABS: 650.0000 mg | ORAL_TABLET | Freq: Four times a day (QID) | ORAL | Status: DC | PRN

## 2019-02-14 MED ORDER — FOLIC ACID 1 MG PO TABS
1.0000 mg | ORAL_TABLET | Freq: Every day | ORAL | Status: DC
  Administered 2019-02-14 – 2019-02-19 (×6): 1 mg via ORAL
  Filled 2019-02-14 (×6): qty 1

## 2019-02-14 MED ORDER — VITAMIN B-1 50 MG PO TABS
250.0000 mg | ORAL_TABLET | Freq: Once | ORAL | Status: AC
  Administered 2019-02-14: 13:00:00 250 mg via ORAL

## 2019-02-14 MED ORDER — SODIUM CHLORIDE 0.9 % IV SOLN
INTRAVENOUS | Status: DC | PRN
  Administered 2019-02-14: 250 mL via INTRAVENOUS
  Administered 2019-02-16: 1000 mL via INTRAVENOUS

## 2019-02-14 MED ORDER — ATORVASTATIN CALCIUM 40 MG PO TABS
40.0000 mg | ORAL_TABLET | Freq: Every day | ORAL | Status: DC
  Administered 2019-02-14 – 2019-02-18 (×5): 40 mg via ORAL
  Filled 2019-02-14 (×5): qty 1

## 2019-02-14 MED ORDER — METOPROLOL TARTRATE 50 MG PO TABS
50.0000 mg | ORAL_TABLET | Freq: Two times a day (BID) | ORAL | Status: DC
  Administered 2019-02-14 – 2019-02-18 (×9): 50 mg via ORAL
  Filled 2019-02-14 (×10): qty 1

## 2019-02-14 MED ORDER — MAGNESIUM SULFATE 2 GM/50ML IV SOLN
2.0000 g | Freq: Once | INTRAVENOUS | Status: AC
  Administered 2019-02-14: 22:00:00 2 g via INTRAVENOUS
  Filled 2019-02-14 (×2): qty 50

## 2019-02-14 MED ORDER — ACETAMINOPHEN 650 MG RE SUPP: 650.0000 mg | Freq: Four times a day (QID) | RECTAL | Status: DC | PRN

## 2019-02-14 MED ORDER — ADULT MULTIVITAMIN W/MINERALS CH
1.0000 | ORAL_TABLET | Freq: Every day | ORAL | Status: DC
  Administered 2019-02-14 – 2019-02-18 (×5): 1 via ORAL
  Filled 2019-02-14 (×5): qty 1

## 2019-02-14 MED ORDER — VITAMIN B-1 100 MG PO TABS
100.0000 mg | ORAL_TABLET | Freq: Every day | ORAL | Status: DC
  Administered 2019-02-14 – 2019-02-19 (×6): 100 mg via ORAL
  Filled 2019-02-14 (×6): qty 1

## 2019-02-14 MED ORDER — CHLORDIAZEPOXIDE HCL 5 MG PO CAPS
50.0000 mg | ORAL_CAPSULE | Freq: Once | ORAL | Status: AC
  Administered 2019-02-14: 50 mg via ORAL

## 2019-02-14 MED ORDER — ENOXAPARIN SODIUM 40 MG/0.4ML ~~LOC~~ SOLN
40.0000 mg | SUBCUTANEOUS | Status: DC
  Administered 2019-02-14 – 2019-02-18 (×5): 40 mg via SUBCUTANEOUS
  Filled 2019-02-14 (×5): qty 0.4

## 2019-02-14 MED ORDER — AMLODIPINE BESYLATE 10 MG PO TABS
10.0000 mg | ORAL_TABLET | Freq: Every day | ORAL | Status: DC
  Administered 2019-02-14 – 2019-02-18 (×5): 10 mg via ORAL
  Filled 2019-02-14 (×6): qty 1

## 2019-02-14 MED ORDER — LISINOPRIL 10 MG PO TABS
10.0000 mg | ORAL_TABLET | Freq: Every day | ORAL | Status: DC
  Administered 2019-02-14 – 2019-02-18 (×5): 10 mg via ORAL
  Filled 2019-02-14 (×6): qty 1

## 2019-02-14 MED ORDER — TAMSULOSIN HCL 0.4 MG PO CAPS
0.4000 mg | ORAL_CAPSULE | Freq: Every day | ORAL | Status: DC
  Administered 2019-02-14 – 2019-02-19 (×6): 0.4 mg via ORAL
  Filled 2019-02-14 (×6): qty 1

## 2019-02-14 NOTE — Progress Notes (Signed)
Internal Medicine Clinic Attending  Case discussed with Dr. Melvin at the time of the visit.  We reviewed the resident's history and exam and pertinent patient test results.  I agree with the assessment, diagnosis, and plan of care documented in the resident's note.  Alexander Raines, M.D., Ph.D.  

## 2019-02-14 NOTE — Progress Notes (Signed)
   CC: Alcohol Withdrawal  HPI:  [name] is a 68 y.o. M with PMHx listed below presenting for Alcohol Withdrawal. Please see the A&P for the status of the patient's chronic medical problems.  Past Medical History:  Diagnosis Date  . Hypertension    Review of Systems:  Performed and all others negative.  Physical Exam:  Vitals:   02/14/19 1029  BP: (!) 164/89  Pulse: 80  Temp: 98.4 F (36.9 C)  TempSrc: Oral  SpO2: 94%  Weight: 121 lb 1.6 oz (54.9 kg)    Physical Exam Constitutional:      Appearance: Normal appearance. He is diaphoretic.     Comments: Thank male in mild distress  Cardiovascular:     Rate and Rhythm: Normal rate and regular rhythm.     Pulses: Normal pulses.     Heart sounds: Normal heart sounds.  Pulmonary:     Effort: Pulmonary effort is normal. No respiratory distress.     Breath sounds: Normal breath sounds.  Abdominal:     General: Bowel sounds are normal. There is no distension.     Palpations: Abdomen is soft.     Tenderness: There is no abdominal tenderness.  Musculoskeletal:        General: No swelling or deformity.  Skin:    General: Skin is warm.  Neurological:     General: No focal deficit present.     Mental Status: Mental status is at baseline.     Comments: Tremulous  Psychiatric:     Comments: Reports hallucinations of a man and a woman    Assessment & Plan:   See Encounters Tab for problem based charting.  Patient discussed with Dr. Rebeca Alert

## 2019-02-14 NOTE — H&P (Signed)
Date: 02/14/2019               Patient Name:  Dan Henry MRN: [id number]  DOB: 1950-08-22 Age / Sex: 68 y.o., male   PCP: [name], MD         Medical Service: Internal Medicine Teaching Service         Attending Physician: Dr. Velna Ochs, MD    First Contact: Dan Henry, MSIV Pager: [phone]  Second Contact: Dr. Sharon Seller Pager: [phone]       After Hours (After 5p/  First Contact Pager: [phone]  weekends / holidays): Second Contact Pager: [phone]   Chief Complaint: "Tremor when I stop drinking"  History of Present Illness: Dan Henry is 68 yo F w/ a PMHx notable for HTN, EtOH abuse, and BPH who presents to [facility] internal medicine clinic for evaluation of tremor that is worsened over the past 24 hours and is associated with diaphoresis and hallucinations.  He stated that he saw a couple standing in the corner talking to each other but not to him..  Patient states that the tremor is improved with alcohol when he consumes at least 2-3 beers. He stated that it initially improved for about a month after his last discharge but he began to drink again and his symptoms possibly worsened. The patient further stated that he is unaware of the quantity of food or other fluids he consumes a given day.  He stated that this is "awful confusing for him", and is unable to recall or name any thing that he ate the prior day.  He said that he likes steak, potatoes and fruit. He endorsed He denied fever, chills, nausea, vomiting, joint pains, muscle aches, headaches or visual changes.   Meds:  Current Outpatient Medications  Medication Instructions  . amLODipine (NORVASC) 10 MG tablet TAKE 1 TABLET BY MOUTH ONCE DAILY  . atorvastatin (LIPITOR) 40 mg, Oral, Daily  . feeding supplement, ENSURE ENLIVE, (ENSURE ENLIVE) LIQD 237 mLs, Oral, 2 times daily between meals  . lisinopril (ZESTRIL) 10 mg, Oral, Daily  . metoprolol tartrate (LOPRESSOR) 50 mg, Oral, 2 times daily  .  multivitamin (ONE-A-DAY MEN'S) TABS tablet 1 tablet, Oral, Daily  . naltrexone (DEPADE) 50 mg, Oral, Daily  . tamsulosin (FLOMAX) 0.4 mg, Oral, Daily   Allergies: Allergies as of 02/14/2019  . (No Known Allergies)   Past Medical History:  Diagnosis Date  . Hypertension    Family History:  Family History  Problem Relation Age of Onset  . Hypertension Mother   . Hypertension Father   . Hypertension Sister   . Hypertension Sister   . Stroke Sister   . Cancer Brother        throat   Social History:  Social History   Tobacco Use  . Smoking status: Never Smoker  . Smokeless tobacco: Never Used  Substance Use Topics  . Alcohol use: Yes    Alcohol/week: 4.0 standard drinks    Types: 4 Standard drinks or equivalent per week    Comment: occ  . Drug use: No   Review of Systems: A complete ROS was negative except as per HPI.   Physical Exam: Blood pressure (!) 147/80, pulse 73, temperature 99.5 F (37.5 C), temperature source Oral, resp. rate 18, SpO2 99 %. Physical Exam Vitals signs reviewed.  Constitutional:      General: He is not in acute distress.    Appearance: He is well-developed. He is not diaphoretic.  HENT:     Head: Normocephalic and atraumatic.  Eyes:     Conjunctiva/sclera: Conjunctivae normal.  Neck:     Musculoskeletal: Normal range of motion.  Cardiovascular:     Rate and Rhythm: Normal rate and regular rhythm.     Heart sounds: No murmur.  Pulmonary:     Effort: Pulmonary effort is normal. No respiratory distress.     Breath sounds: Normal breath sounds. No stridor.  Abdominal:     General: Bowel sounds are normal. There is no distension.     Palpations: Abdomen is soft.  Skin:    General: Skin is warm.  Neurological:     Mental Status: He is alert and oriented to person, place, and time.  Psychiatric:        Mood and Affect: Mood normal.        Behavior: Behavior normal.    EKG: personally reviewed my interpretation is NSR w/ LVH   Assessment & Plan by Problem: Active Problems:   Malnutrition of moderate degree   Alcohol withdrawal delirium (HCC)  Assessment: Dan Henry is 68 yo F w/ a PMHx notable for HTN, EtOH abuse, and BPH who presents to [facility] internal medicine clinic for evaluation of tremor that is worsened over the past 24 hours and is associated with diaphoresis and hallucinations. Although certainly concerning for EtOH withdrawal tremor I am not convinced. His tremor could be explained by either benign essential tremor given his improvement in symptoms with alcohol and chronicity versus EtOH use disorder. She will need further evaluation.   Plan: Tremor: EtOH use disorder: Concern for alcohol withdrawal.  Patient wishing to detox given onset of tremor and symptoms to be unsafe in outpatient setting.  EKG unremarkable.  We will assess patient for hallucinations and CIWA scores daily.  Will need social work consult. -Librium taper started -CIWA with Ativan 1 to 4 mg per protocol -Patient given thiamine and folic acid -CMP stable -Mag 1.4 repleted with 2 g of IV -Repeat mag, CMP and phosphorus in the morning -Phosphorus stable -UDS pending -Telemetry monitoring  HTN: BP 147/80.  We will continue his home medications. -Continue amlodipine 10 mg daily -Continue lisinopril 10 mg daily  Diet: Regular Code: Full DVT PPX: Enoxaparin Dispo: Admit patient to Inpatient with expected length of stay greater than 2 midnights.  Signed: Kathi Ludwig, MD 02/14/2019, 5:12 PM  Pager: # [phone]

## 2019-02-14 NOTE — Addendum Note (Signed)
Addended by: Nicola Girt on: 02/14/2019 12:15 PM   Modules accepted: Orders, SmartSet

## 2019-02-14 NOTE — Assessment & Plan Note (Signed)
Patient presents with 1 day of worsening tremors, diaphoresis, and hallucinations. He also reports recent nose bleeds, elevated blood pressure, weakness, and decreased PO intake. He is alert and oriented. He has diffuse tremors and significant diaphoresis. He reports hallucinations of a man and a women talking to each other, but not to him. He had simlar symptoms prior to a previous admission for alcohol withdrawal in June. He state his last drink was yesterday. He reports drinking just 2 or so can of beer per day, but this is difficult to confirm as he lives alone and has a niece (who accompanied him today) who come and checks on him every now and then. He had a degree of refeeding syndrome during his most recent admission.  He state he wants to quit drinking to improve his health and prevent these symptoms for re-occurring. He will require admission for delirium tremens and medically supervised withdrawal. He will also need to be monitored for refeeding syndrome given poor PO intake. - Admit to inpatient - CMP, CBC, ETOH, Mg, Phos - UDS - Thiamine 250mg  x1 - Librium 50mg  x1 - EKG

## 2019-02-15 DIAGNOSIS — Z79899 Other long term (current) drug therapy: Secondary | ICD-10-CM

## 2019-02-15 DIAGNOSIS — F10232 Alcohol dependence with withdrawal with perceptual disturbance: Secondary | ICD-10-CM

## 2019-02-15 DIAGNOSIS — N400 Enlarged prostate without lower urinary tract symptoms: Secondary | ICD-10-CM | POA: Diagnosis not present

## 2019-02-15 DIAGNOSIS — I10 Essential (primary) hypertension: Secondary | ICD-10-CM

## 2019-02-15 LAB — COMPREHENSIVE METABOLIC PANEL
ALT: 57 U/L — ABNORMAL HIGH (ref 0–44)
AST: 70 U/L — ABNORMAL HIGH (ref 15–41)
Albumin: 4.1 g/dL (ref 3.5–5.0)
Alkaline Phosphatase: 48 U/L (ref 38–126)
Anion gap: 12 (ref 5–15)
BUN: 16 mg/dL (ref 8–23)
CO2: 25 mmol/L (ref 22–32)
Calcium: 8.9 mg/dL (ref 8.9–10.3)
Chloride: 101 mmol/L (ref 98–111)
Creatinine, Ser: 1.35 mg/dL — ABNORMAL HIGH (ref 0.61–1.24)
GFR calc Af Amer: >60 mL/min (ref >60)
GFR calc non Af Amer: 54 mL/min — ABNORMAL LOW (ref >60)
Glucose, Bld: 119 mg/dL — ABNORMAL HIGH (ref 70–99)
Potassium: 2.9 mmol/L — ABNORMAL LOW (ref 3.5–5.1)
Sodium: 138 mmol/L (ref 135–145)
Total Bilirubin: 1.3 mg/dL — ABNORMAL HIGH (ref 0.3–1.2)
Total Protein: 7.4 g/dL (ref 6.5–8.1)

## 2019-02-15 LAB — MAGNESIUM: Magnesium: 1.8 mg/dL (ref 1.7–2.4)

## 2019-02-15 LAB — SARS CORONAVIRUS 2 (TAT 6-24 HRS): SARS Coronavirus 2: NEGATIVE

## 2019-02-15 LAB — CBC
HCT: 31.3 % — ABNORMAL LOW (ref 39.0–52.0)
Hemoglobin: 10.6 g/dL — ABNORMAL LOW (ref 13.0–17.0)
MCH: 29.9 pg (ref 26.0–34.0)
MCHC: 33.9 g/dL (ref 30.0–36.0)
MCV: 88.4 fL (ref 80.0–100.0)
Platelets: 244 10*3/uL (ref 150–400)
RBC: 3.54 MIL/uL — ABNORMAL LOW (ref 4.22–5.81)
RDW: 16.8 % — ABNORMAL HIGH (ref 11.5–15.5)
WBC: 5.6 10*3/uL (ref 4.0–10.5)
nRBC: 0 % (ref 0.0–0.2)

## 2019-02-15 LAB — PHOSPHORUS: Phosphorus: 2.9 mg/dL (ref 2.5–4.6)

## 2019-02-15 MED ORDER — CHLORDIAZEPOXIDE HCL 25 MG PO CAPS
25.0000 mg | ORAL_CAPSULE | Freq: Three times a day (TID) | ORAL | Status: DC
  Administered 2019-02-15 (×2): 25 mg via ORAL
  Filled 2019-02-15 (×2): qty 1

## 2019-02-15 MED ORDER — POTASSIUM CHLORIDE CRYS ER 20 MEQ PO TBCR
40.0000 meq | EXTENDED_RELEASE_TABLET | ORAL | Status: AC
  Administered 2019-02-15 (×2): 40 meq via ORAL
  Filled 2019-02-15 (×3): qty 2

## 2019-02-15 MED ORDER — CHLORDIAZEPOXIDE HCL 25 MG PO CAPS
25.0000 mg | ORAL_CAPSULE | Freq: Two times a day (BID) | ORAL | Status: DC
  Administered 2019-02-15: 25 mg via ORAL
  Filled 2019-02-15: qty 1

## 2019-02-15 MED ORDER — SODIUM CHLORIDE 0.9 % IV BOLUS
500.0000 mL | Freq: Once | INTRAVENOUS | Status: AC
  Administered 2019-02-15: 500 mL via INTRAVENOUS

## 2019-02-15 NOTE — Progress Notes (Signed)
Patient progressively getting weaker-  But pt refused to stay in bed.  Still trying to leave through fire exit.    Patient being restrained in chair.  Was able to calm down for food, but he just tried getting back up again.   Another 2 mg of ativan given, MD notified.     MD came assess patient-  Will order non-violent restraints.    Both wrist restrained- assess down of extremities, and patient still trying to crawl out of bed.

## 2019-02-15 NOTE — Progress Notes (Signed)
CSW has completed and reviewed the IVC. CSW has faxed the IVC to the magistrate's office and is awaiting confirmation. CSW will call and have the patient served. Also, will place on the patient's chart.   Domenic Schwab, MSW, [place] Worker [facility]  [phone]

## 2019-02-15 NOTE — Progress Notes (Signed)
Pt noncompliant with alarms. Continues to walk unaccompanied and has removed EKG leads. Replaced and educated. Will continue to monitor.

## 2019-02-15 NOTE — Progress Notes (Signed)
I was paged to Mr. [place] room earlier today because the patient stated that he was going to go home.  I went up to personally evaluate the patient and found him walking in the hallways with his nurse by his side. He appeared unstable while walking. The patient's nurse stated that she was concerned regarding letting the patient leave due to risk of falling.  Upon evaluation of the patient, he appeared disoriented.  He was oriented to person but when asked where he was he would state " at your place".  When asked what year it is he stated "1926".  During my evaluation the patient had visual hallucinations stating that there were 15 other people in the room with [place].  Patient was unable to give a clear reason for why he was wanting to leave, but appeared anxious and stated that he wanted to go home. When I evaluated the patient earlier her clearly stated that he wanted to remain in the hospital to treat his withdrawal. This information was corroborated by the patient's niece.  At this time, the patient does not have capacity to make decisions due to his altered mental status and I will involuntarily commit him for his safety.   Marianna Payment, D.O. Date 02/15/2019 Time 4:02 PM IMTP, PGY-1 Pager: [phone]

## 2019-02-15 NOTE — Progress Notes (Signed)
   Subjective:   Patient is only alert to person at this time. Previously stated that he usually drinks 2-3 16oz beers per day, but has recently tried to cut down. He admits to having ETOH withdrawals in the past. Patient States that he continues to have tremors in both of his hands and appears agitated.  Objective:  Vital signs in last 24 hours: Vitals:   02/15/19 0518 02/15/19 0736 02/15/19 1054 02/15/19 1145  BP: 108/64 123/73 (!) 136/120 105/71  Pulse: 96 91 (!) 155 86  Resp: 17 16  19   Temp: 98.2 F (36.8 C) 98.4 F (36.9 C)  (!) 97.5 F (36.4 C)  TempSrc: Oral Oral  Oral  SpO2: 100% 100%  100%  Weight:        Physical Exam: Physical Exam  Constitutional: He appears distressed.  HENT:  Head: Atraumatic.  Eyes: EOM are normal.  Neck: Normal range of motion.  Cardiovascular: Normal rate, regular rhythm, normal heart sounds and intact distal pulses. Exam reveals no gallop and no friction rub.  No murmur heard. Pulmonary/Chest: Effort normal. No respiratory distress. He exhibits no tenderness.  Abdominal: Soft. There is no abdominal tenderness.  Musculoskeletal: Normal range of motion.        General: No tenderness.  Neurological: He is alert. He is agitated and disoriented. He displays tremor. No cranial nerve deficit (grossly intact).  Skin: Skin is warm and dry.  Psychiatric: His mood appears anxious.    Pertinent labs/Imaging: #Acute Alcohol Withdrawal:    Assessment/Plan:  Active Problems:   Malnutrition of moderate degree   Alcohol withdrawal delirium [facility])    Patient Summary: 68 year old male with a pertinent past medical history of hypertension, alcohol use disorder, and benign prostatic hyperplasia who presented to the [facility] health internal medicine clinic with tremors, visual hallucinations, and diaphoresis consistent with alcohol withdrawal.  #Alcohol Withdrawal: Patient signs and symptoms are concerning for alcohol withdrawal.  The patient stated  that he would like to remain inpatient in order to detox.  He is currently symptomatic with tremors in bilateral extremities and tongue, agitation/anxiety, with visual hallucinations. - Increased dose of Librium taper to 25 mg 3 times daily - CIWA with Ativan every 4 hours - Continue thiamine and folic acid supplementation - Replete mag if less than 2 and phosphorus less than 4 - UDS pending - Continue telemetry. - Patient actively trying to leave the hospital at this time despite disorientation.  Therefore, we will involuntarily commit the patient for treatment.   #HTN:  - Continue amlodipine 10 mg - Continue lisinopril 10 mg   Diet: Regular IVF: none VTE: Enoxaparin Code: Full  Dispo: Anticipated discharge pending clinical improvement.  Marianna Payment, MD 02/15/2019, 2:40 PM Pager: [phone]

## 2019-02-15 NOTE — Progress Notes (Signed)
Pt remains noncompliant with alarms and fall procedures. Was wandering past the nurse's station looking for someone he was unable to identify. Reoriented to unit and walked him back to his room. Will continue to monitor.

## 2019-02-15 NOTE — Progress Notes (Signed)
Patient trying to leave by way of fire exits.  Seems very agitated.  Originally plan to give 1 mg ativan, but patient ended up requiring 2 mg (see CIWA) Paged MD and patient's niece-  Niece called room and spoke to patient.   MD came by to see patient

## 2019-02-15 NOTE — Progress Notes (Addendum)
Date: 02/15/2019  Patient name: Dan Henry  Medical record number: [id number]  Date of birth: [birth year]   I have seen and evaluated Dan Henry and discussed their care with the Residency Team.  In brief, patient is a 68 year old male with a past medical history of hypertension, alcohol use disorder and BPH who presented to the internal medicine clinic for evaluation of tremors.  Patient states that he began drinking again after his last discharge from the hospital for alcohol withdrawal in June.  States that he drinks 2 to 3 16 ounce beers a day.  States that when he does not drink he develops these tremors and that drinking helps with his tremors.  Over the last 24 hours prior to admission patient noted that in addition to his tremors he also had diaphoresis and visual hallucinations.  Patient states that he saw a couple standing in the corner talking but they were not actually there.  He states that he does not want to go back to drinking and was worried about withdrawal symptoms and followed up in [facility] for this.  Given patient appeared to be in alcohol withdrawal at the time of his presentation to [facility] he was admitted to the hospital for further evaluation.  No fevers or chills, no chest pain, no shortness of breath, no palpitations, no headache, no blurry vision, no focal weakness, no tingling or numbness, no nausea or vomiting, no abdominal pain, no diarrhea.  Today patient states that he feels a little better and has no visual hallucinations but still has persistent tremors.  PMHx, Fam Hx, and/or Soc Hx : As per resident admit note  Vitals:   02/15/19 0736 02/15/19 1054  BP: 123/73 (!) 136/120  Pulse: 91 (!) 155  Resp: 16   Temp: 98.4 F (36.9 C)   SpO2: 100%    General: Awake, alert, oriented x3, NAD CVS: Mildly tachycardic Lungs: CTA bilaterally Abdomen: Soft, nontender, nondistended, normoactive bowel sounds Extremities: No edema noted, nontender to palpation HEENT:  Normocephalic, atraumatic Neuro: Oriented x3, bilateral hand tremor noted on exam as well as mild tongue fasciculations. Psych: No hallucinations currently, normal mood and affect, denies anxiety  Assessment and Plan: I have seen and evaluated the patient as outlined above. I agree with the formulated Assessment and Plan as detailed in the residents' note, with the following changes:   1.  Acute alcohol withdrawal: -Patient presented to [facility] with worsening tremors, diaphoresis and visual hallucinations in setting of attempting to cut back on his alcohol consistent with an acute alcohol withdrawal.  It is also possible that his hand tremors are essential tremors that improved with alcohol but given his visual hallucinations and tongue fasciculations I am inclined to think that this is alcohol withdrawal. -Patient required only 1 dose of Ativan overnight -We will decrease Librium to 25 mg twice a day for now and taper as tolerated -Continue with CIWA protocol with Ativan as needed -Patient also had mildly elevated LFTs on admission AST 88, ALT 72.  This has improved slightly today to AST 70, ALT 57.  We will continue to monitor for now. -UDS is within normal limits and patient alcohol level is less than 10 -Of note, patient's creatinine has worsened 1.35 from 0.96 today.  I suspect this is likely prerenal in nature.  Will encourage oral intake and monitor closely.  If his creatinine remains persistently elevated would start patient on IV fluids and monitor -We will monitor on telemetry for now -I suspect the patient should  be able to be discharged this weekend or early next week if he continues to improve.  Aldine Contes, MD 10/16/202011:00 AM

## 2019-02-15 NOTE — Progress Notes (Signed)
CIWA still elevated- concern for delirium if give ativan too frequently.   Patient's speech is not as clear as was earlier-  Patient pocketed pill.   Seem more unsteady, but still wanting to walk.   Sitter at bedside at this time.    Positive for tremor, 1 episode of vomiting, mild itching and headache per pt, still slightly agitated.

## 2019-02-16 DIAGNOSIS — N179 Acute kidney failure, unspecified: Secondary | ICD-10-CM

## 2019-02-16 DIAGNOSIS — F10239 Alcohol dependence with withdrawal, unspecified: Secondary | ICD-10-CM

## 2019-02-16 DIAGNOSIS — E876 Hypokalemia: Secondary | ICD-10-CM

## 2019-02-16 LAB — BASIC METABOLIC PANEL
Anion gap: 11 (ref 5–15)
Anion gap: 10 (ref 5–15)
BUN: 15 mg/dL (ref 8–23)
BUN: 14 mg/dL (ref 8–23)
CO2: 20 mmol/L — ABNORMAL LOW (ref 22–32)
CO2: 21 mmol/L — ABNORMAL LOW (ref 22–32)
Calcium: 8.8 mg/dL — ABNORMAL LOW (ref 8.9–10.3)
Calcium: 8.7 mg/dL — ABNORMAL LOW (ref 8.9–10.3)
Chloride: 107 mmol/L (ref 98–111)
Chloride: 106 mmol/L (ref 98–111)
Creatinine, Ser: 1.08 mg/dL (ref 0.61–1.24)
Creatinine, Ser: 0.91 mg/dL (ref 0.61–1.24)
GFR calc Af Amer: >60 mL/min (ref >60)
GFR calc Af Amer: >60 mL/min (ref >60)
GFR calc non Af Amer: >60 mL/min (ref >60)
GFR calc non Af Amer: >60 mL/min (ref >60)
Glucose, Bld: 101 mg/dL — ABNORMAL HIGH (ref 70–99)
Glucose, Bld: 103 mg/dL — ABNORMAL HIGH (ref 70–99)
Potassium: 3.6 mmol/L (ref 3.5–5.1)
Potassium: 4 mmol/L (ref 3.5–5.1)
Sodium: 138 mmol/L (ref 135–145)
Sodium: 137 mmol/L (ref 135–145)

## 2019-02-16 LAB — PHOSPHORUS
Phosphorus: 2.2 mg/dL — ABNORMAL LOW (ref 2.5–4.6)
Phosphorus: 4.6 mg/dL (ref 2.5–4.6)

## 2019-02-16 LAB — CBC
HCT: 30 % — ABNORMAL LOW (ref 39.0–52.0)
Hemoglobin: 9.8 g/dL — ABNORMAL LOW (ref 13.0–17.0)
MCH: 29.2 pg (ref 26.0–34.0)
MCHC: 32.7 g/dL (ref 30.0–36.0)
MCV: 89.3 fL (ref 80.0–100.0)
Platelets: 210 10*3/uL (ref 150–400)
RBC: 3.36 MIL/uL — ABNORMAL LOW (ref 4.22–5.81)
RDW: 16.4 % — ABNORMAL HIGH (ref 11.5–15.5)
WBC: 7.2 10*3/uL (ref 4.0–10.5)
nRBC: 0 % (ref 0.0–0.2)

## 2019-02-16 LAB — MAGNESIUM
Magnesium: 1.7 mg/dL (ref 1.7–2.4)
Magnesium: 1.8 mg/dL (ref 1.7–2.4)

## 2019-02-16 MED ORDER — MAGNESIUM SULFATE 2 GM/50ML IV SOLN
2.0000 g | Freq: Once | INTRAVENOUS | Status: AC
  Administered 2019-02-16: 18:00:00 2 g via INTRAVENOUS
  Filled 2019-02-16: qty 50

## 2019-02-16 MED ORDER — CHLORDIAZEPOXIDE HCL 25 MG PO CAPS
50.0000 mg | ORAL_CAPSULE | Freq: Three times a day (TID) | ORAL | Status: DC
  Administered 2019-02-16 (×3): 50 mg via ORAL
  Filled 2019-02-16 (×3): qty 2

## 2019-02-16 MED ORDER — POTASSIUM PHOSPHATES 15 MMOLE/5ML IV SOLN
30.0000 mmol | Freq: Once | INTRAVENOUS | Status: AC
  Administered 2019-02-16: 13:00:00 30 mmol via INTRAVENOUS
  Filled 2019-02-16: qty 10

## 2019-02-16 NOTE — Progress Notes (Signed)
Subjective: Patient was seen this morning during rounds.  Patient's withdrawal symptoms seem to have improved.  He continues to have tremors at this time.  His orientation has improved and he is alert and oriented to person place time and event.  He denies any chest pain, shortness of breath, dizziness, abdominal pain.  Objective:  Vital signs in last 24 hours: Vitals:   02/16/19 0436 02/16/19 0742 02/16/19 1148 02/16/19 1151  BP:  119/75 100/64 100/64  Pulse:  86 81 81  Resp:    16  Temp:  98.3 F (36.8 C) 98.4 F (36.9 C) 98.4 F (36.9 C)  TempSrc:  Oral Oral Oral  SpO2:  100% 98% 98%  Weight: 54.3 kg     Height: 5\' 4"  (1.626 m)       Physical Exam: Physical Exam  Constitutional: He is oriented to person, place, and time. No distress.  HENT:  Head: Atraumatic.  Eyes: EOM are normal.  Neck: Normal range of motion.  Cardiovascular: Normal rate, regular rhythm, normal heart sounds and intact distal pulses. Exam reveals no gallop and no friction rub.  No murmur heard. Pulmonary/Chest: Effort normal and breath sounds normal.  Abdominal: Soft. He exhibits no distension. There is no abdominal tenderness.  Musculoskeletal: Normal range of motion.        General: No tenderness or edema.  Neurological: He is alert and oriented to person, place, and time. He displays tremor.  Skin: Skin is warm and dry. He is not diaphoretic.     Pertinent labs/Imaging: CMP Latest Ref Rng & Units 02/16/2019 02/15/2019 02/14/2019  Glucose 70 - 99 mg/dL 101(H) 119(H) 159(H)  BUN 8 - 23 mg/dL 15 16 12   Creatinine 0.61 - 1.24 mg/dL 1.08 1.35(H) 0.96  Sodium 135 - 145 mmol/L 138 138 144  Potassium 3.5 - 5.1 mmol/L 3.6 2.9(L) 4.0  Chloride 98 - 111 mmol/L 107 101 105  CO2 22 - 32 mmol/L 20(L) 25 25  Calcium 8.9 - 10.3 mg/dL 8.8(L) 8.9 9.4  Total Protein 6.5 - 8.1 g/dL - 7.4 8.3(H)  Total Bilirubin 0.3 - 1.2 mg/dL - 1.3(H) 1.0  Alkaline Phos 38 - 126 U/L - 48 67  AST 15 - 41 U/L - 70(H) 88(H)   ALT 0 - 44 U/L - 57(H) 72(H)       Assessment/Plan:  Active Problems:   Malnutrition of moderate degree   Alcohol withdrawal delirium [facility])   Patient Summary: 68 year old male with a pertinent past medical history of hypertension, alcohol use disorder, and benign prostatic hyperplasia who presented to the [facility] health internal medicine clinic with alcohol withdrawal complicated by acute kidney injury.   #Alcohol Withdrawal: Patient's mentation has improved in the last 24 hours.  He is alert to person place time and event.  He continues to have tremors and anxiety, but it is difficult to assess whether or not he is still actively hallucinating.  Patient was IVC'd yesterday and remains IVC'd at this time.  If he tries to leave he will need to be reevaluated for competency at that time. Patient has had hypokalemia, hypomagnesemia and hypophosphatemia over the last 24 hours.  - Continue CIWA's with Ativan every q1 hours - We will increase his Librium today to 50 mg 3 times daily - Continue thiamine and folic acid supplementation - Discontinue telemetry today. - We will follow his magnesium and phosphorus closely. Replete today and repeat labs this afternoon.   #HTN:  - Continue amlodipine 10 mg - Continue  lisinopril 10 mg  #AKI: -Resolving with fluids.  Diet: Regular IVF: none VTE: Enoxaparin  Code: Full  Dispo: Anticipated discharge pending clinical improvement  Marianna Payment, MD 02/16/2019, 12:17 PM Pager: [phone]

## 2019-02-16 NOTE — Progress Notes (Addendum)
Paged to the patient's room regarding increased agitation.  I personally evaluated the patient.  Patient had received multiple doses of Ativan but can continued to be agitated.  He was unable to be redirected and continued to insist on leaving and walking around the halls.  During this time the patient was noted to be very weak and was running into objects.  The nurse and I were concerned that he was at risk of falling or potentially injuring himself.  At that time the patient was not oriented place, time or event and and was deemed not competent to make medical decisions.  For these reasons, will order one-to-one patient monitoring and wrist restraints for the patient safety.  Marianna Payment, D.O. Date 02/15/2019 Time 6:00pm IMTP, PGY-1 Pager: [phone]

## 2019-02-17 LAB — BASIC METABOLIC PANEL
Anion gap: 9 (ref 5–15)
BUN: 14 mg/dL (ref 8–23)
CO2: 23 mmol/L (ref 22–32)
Calcium: 8.8 mg/dL — ABNORMAL LOW (ref 8.9–10.3)
Chloride: 107 mmol/L (ref 98–111)
Creatinine, Ser: 0.99 mg/dL (ref 0.61–1.24)
GFR calc Af Amer: >60 mL/min (ref >60)
GFR calc non Af Amer: >60 mL/min (ref >60)
Glucose, Bld: 96 mg/dL (ref 70–99)
Potassium: 3.6 mmol/L (ref 3.5–5.1)
Sodium: 139 mmol/L (ref 135–145)

## 2019-02-17 LAB — PHOSPHORUS: Phosphorus: 4.2 mg/dL (ref 2.5–4.6)

## 2019-02-17 LAB — MAGNESIUM: Magnesium: 2.2 mg/dL (ref 1.7–2.4)

## 2019-02-17 MED ORDER — CHLORDIAZEPOXIDE HCL 25 MG PO CAPS
50.0000 mg | ORAL_CAPSULE | Freq: Three times a day (TID) | ORAL | Status: DC
  Administered 2019-02-17 (×2): 50 mg via ORAL
  Filled 2019-02-17 (×2): qty 2

## 2019-02-17 MED ORDER — CHLORDIAZEPOXIDE HCL 25 MG PO CAPS
25.0000 mg | ORAL_CAPSULE | Freq: Three times a day (TID) | ORAL | Status: DC
  Administered 2019-02-17: 25 mg via ORAL
  Filled 2019-02-17: qty 1

## 2019-02-17 NOTE — Progress Notes (Signed)
   Subjective:  Mr. Breheny is feeling much better today. He denies pain, SOB, anxiety, or current symptoms of withdrawal. Seen while eating breakfast. He continues to state he would like to stop drinking alcohol.   Objective:  Vital signs in last 24 hours: Vitals:   02/16/19 1148 02/16/19 1151 02/16/19 1552 02/16/19 2004  BP: 100/64 100/64 (!) 98/57 (!) 102/58  Pulse: 81 81 80 77  Resp:  16 16 18   Temp: 98.4 F (36.9 C) 98.4 F (36.9 C) 98.6 F (37 C) 98.6 F (37 C)  TempSrc: Oral Oral Oral Oral  SpO2: 98% 98% 97% 99%  Weight:      Height:       Constitution: NAD, sitting up in bed Cardio: rrr, no m/r/g  Respiratory: cta, no w/r/r  MSK: tremor present Neuro: alert and orientedx3, normal affect Skin: non-diaphoretic   Assessment/Plan:  Active Problems:   Malnutrition of moderate degree   Alcohol withdrawal delirium ([place])  68 year old male with a pertinent past medical history of hypertension, alcohol use disorder, and benign prostatic hyperplasia who presented to the [facility] health internal medicine clinic with alcohol withdrawal complicated by acute kidney injury.   Alcohol Withdrawal: Significantly improved, has only required 2mg  IV ativan overnight. Will continue libirum 50 mg tid. If he does not require IV ativan overnight, he likely will be able to be discharged with librium taper. This was discussed with his niece as well.  - cont. To trend electrolytes and replete - Continue CIWA's with Ativan every q1 hours - cont. Librium 50 mg tid - Continue thiamine and folic acid supplementation  HTN:  - Continue amlodipine 10 mg - Continue lisinopril 10 mg  AKI: resolved  VTE: lovenox IVF: none Diet: regular Code: full   Dispo: Anticipated discharge in 1-2 days.   Marty Heck, DO 02/17/2019, 6:27 AM Pager: [phone]

## 2019-02-18 DIAGNOSIS — F10239 Alcohol dependence with withdrawal, unspecified: Secondary | ICD-10-CM | POA: Diagnosis not present

## 2019-02-18 LAB — BASIC METABOLIC PANEL
Anion gap: 10 (ref 5–15)
BUN: 15 mg/dL (ref 8–23)
CO2: 20 mmol/L — ABNORMAL LOW (ref 22–32)
Calcium: 8.6 mg/dL — ABNORMAL LOW (ref 8.9–10.3)
Chloride: 108 mmol/L (ref 98–111)
Creatinine, Ser: 1.08 mg/dL (ref 0.61–1.24)
GFR calc Af Amer: >60 mL/min (ref >60)
GFR calc non Af Amer: >60 mL/min (ref >60)
Glucose, Bld: 113 mg/dL — ABNORMAL HIGH (ref 70–99)
Potassium: 3.5 mmol/L (ref 3.5–5.1)
Sodium: 138 mmol/L (ref 135–145)

## 2019-02-18 LAB — CBC
HCT: 28.6 % — ABNORMAL LOW (ref 39.0–52.0)
Hemoglobin: 9.4 g/dL — ABNORMAL LOW (ref 13.0–17.0)
MCH: 29.6 pg (ref 26.0–34.0)
MCHC: 32.9 g/dL (ref 30.0–36.0)
MCV: 89.9 fL (ref 80.0–100.0)
Platelets: 193 10*3/uL (ref 150–400)
RBC: 3.18 MIL/uL — ABNORMAL LOW (ref 4.22–5.81)
RDW: 16.4 % — ABNORMAL HIGH (ref 11.5–15.5)
WBC: 4.3 10*3/uL (ref 4.0–10.5)
nRBC: 0 % (ref 0.0–0.2)

## 2019-02-18 LAB — PHOSPHORUS: Phosphorus: 3.9 mg/dL (ref 2.5–4.6)

## 2019-02-18 LAB — MAGNESIUM: Magnesium: 2 mg/dL (ref 1.7–2.4)

## 2019-02-18 MED ORDER — CHLORDIAZEPOXIDE HCL 25 MG PO CAPS
50.0000 mg | ORAL_CAPSULE | Freq: Two times a day (BID) | ORAL | Status: DC
  Administered 2019-02-18 – 2019-02-19 (×3): 50 mg via ORAL
  Filled 2019-02-18 (×3): qty 2

## 2019-02-18 NOTE — Care Management Important Message (Signed)
Important Message  Patient Details  Name: Kingdavid Todhunter MRN: [id number] Date of Birth: [birth year]   Medicare Important Message Given:  Yes     [name] 02/18/2019, 1:20 PM

## 2019-02-18 NOTE — Evaluation (Signed)
Occupational Therapy Evaluation Patient Details Name: Dan Henry MRN: [id number] DOB: [birth year] Today's Date: 02/18/2019    History of Present Illness 68 yo F w/ a PMHx notable for HTN, EtOH abuse, and BPH who presents to [facility] internal medicine clinic for evaluation of tremor that is worsened over the past 24 hours and is associated with diaphoresis and hallucinations.   Clinical Impression   Pt currently performing self care tasks at mod I level per pt report and staff report. At time of evaluation, pt very restless and agitated over wanting to leave hospital and being told discharge planned for tomorrow. Pt declines further OT intervention and likely does not need except for higher level balance tasks. Functionally pt appears at mod I level within the room. Pt seated in recliner chair at end of session with call bell within reach and chair alarm activated. RN notified of behavior. OT to SIGN OFF. Thank you for referral.      Follow Up Recommendations  No OT follow up    Equipment Recommendations  None recommended by OT    Recommendations for Other Services (none at this time)     Precautions / Restrictions Precautions Precautions: Fall Restrictions Weight Bearing Restrictions: No      Mobility Bed Mobility Overal bed mobility: Independent   Transfers Overall transfer level: Independent Equipment used: None       Balance Overall balance assessment: Needs assistance Sitting-balance support: Feet supported;Bilateral upper extremity supported Sitting balance-Leahy Scale: Good    ADL either performed or assessed with clinical judgement   ADL Overall ADL's : Modified independent      General ADL Comments: pt is fully dressed and sitting in recliner chair this session. Pt ambulating about unit without use of AD.     Vision Baseline Vision/History: Wears glasses Wears Glasses: Reading only Patient Visual Report: No change from baseline Vision Assessment?:  No apparent visual deficits     Perception     Praxis      Pertinent Vitals/Pain Pain Assessment: No/denies pain     Hand Dominance Right   Extremity/Trunk Assessment Upper Extremity Assessment Upper Extremity Assessment: Overall WFL for tasks assessed   Lower Extremity Assessment Lower Extremity Assessment: Overall WFL for tasks assessed   Cervical / Trunk Assessment Cervical / Trunk Assessment: Normal   Communication Communication Communication: No difficulties   Cognition Arousal/Alertness: Awake/alert Behavior During Therapy: Restless;Agitated Overall Cognitive Status: Within Functional Limits for tasks assessed                   Home Living Family/patient expects to be discharged to:: Private residence Living Arrangements: Alone Available Help at Discharge: Family;Available PRN/intermittently Type of Home: House Home Access: Level entry     Home Layout: One level     Bathroom Shower/Tub: Teacher, early years/pre: Standard     Home Equipment: Cane - single point          Prior Functioning/Environment Level of Independence: Needs assistance  Gait / Transfers Assistance Needed: pt requires assistance for ambulation when experiencing tremors (reports they happen ~1 week per month) ADL's / Homemaking Assistance Needed: Independent                     OT Goals(Current goals can be found in the care plan section) Acute Rehab OT Goals Patient Stated Goal: I want to go home OT Goal Formulation: With patient Time For Goal Achievement: 03/04/19   AM-PAC OT "6 Clicks" Daily Activity  Outcome Measure Help from another person eating meals?: None Help from another person taking care of personal grooming?: None Help from another person toileting, which includes using toliet, bedpan, or urinal?: None Help from another person bathing (including washing, rinsing, drying)?: None Help from another person to put on and taking off regular upper  body clothing?: None Help from another person to put on and taking off regular lower body clothing?: None 6 Click Score: 24   End of Session Equipment Utilized During Treatment: (none needed) Nurse Communication: Precautions  Activity Tolerance: Treatment limited secondary to agitation Patient left: in chair;with call bell/phone within reach;with chair alarm set  OT Visit Diagnosis: Unsteadiness on feet (R26.81)                Time: 1320-1330 OT Time Calculation (min): 10 min Charges:  OT General Charges $OT Visit: 1 Visit OT Evaluation $OT Eval Low Complexity: 1 Low  Mareesa Gathright P MS, OTR/L 02/18/2019, 2:03 PM

## 2019-02-18 NOTE — Progress Notes (Signed)
   Subjective: The patient continues to request discharge while at the same time stating that he wishes to complete his EtOH withdrawal treatment. He denied pain, agitation, or hallucinations.   Objective:  Vital signs in last 24 hours: Vitals:   02/18/19 0500 02/18/19 0508 02/18/19 1002 02/18/19 1625  BP:  133/76 127/82 104/71  Pulse:  77 80 81  Resp:    18  Temp:  98.2 F (36.8 C) 98.3 F (36.8 C) 97.8 F (36.6 C)  TempSrc:  Oral Oral Oral  SpO2:  98% 99% 99%  Weight: 57.2 kg     Height:       General: A/O x4, in no acute distress, afebrile, nondiaphoretic MSK: BLE nontender, nonedematous Neuro: Ambulating w/o assistance, gait normal, no focal deficits  Psych: Appropriate affect, not depressed in appearance, engages well  Patient Encounter summary: 68 year old male with a pertinent past medical history of hypertension, alcohol use disorder, and benign prostatic hyperplasia who presented to the [facility] health internal medicine clinic withalcohol withdrawal complicated by acute kidney injury.  Assessment/Plan:  Active Problems:   Malnutrition of moderate degree   Alcohol withdrawal delirium ([facility])  Alcohol withdrawal: Significantly improved again today.  Tremors stabilized.  Patient denied hallucinations.  The patient does not appear to have the capacity to completely understand his condition despite being oriented. He requests to complete detoxification from EtOH also requesting discharge.  He does not appear to understand the possible lethality of nonmedically guided alcohol detoxification. He has agreed to remain hospitalized. He will complete his treatment as an inpatient with likely discharge home tomorrow after his last dose of Librium. -Decrease Librium to 50 mg twice daily today -Continue thiamine folic acid and mentation -Continue CIWA, as he was greater than 10 consistently okay to resume MedSurg Ativan -Continue to trend electrolytes and replete  Hypertension: BP  stable with most recent at 104/71. -Continue amlodipine 10 mg daily -Continue lisinopril 10 mg daily  Diet: Regular Code: Full DVT prophylaxis: Lovenox Dispo: Anticipated discharge in approximately 1 day(s).   Kathi Ludwig, MD 02/18/2019, 5:28 PM Pager: # [phone]

## 2019-02-18 NOTE — Plan of Care (Signed)

## 2019-02-18 NOTE — Progress Notes (Signed)
  Date: 02/18/2019  Patient name: Dan Henry  Medical record number: [id number]  Date of birth: Mar 08, 1951        I have seen and evaluated this patient and I have discussed the plan of care with the house staff. Please see their note for complete details. I concur with their finding.  Patient remains IVC'd for treatment of his alcohol withdrawal. Did request to leave again today but was pleasant and redirectable. Continue with librium taper, likely discharge tomorrow.   Velna Ochs, MD 02/18/2019, 7:39 PM

## 2019-02-18 NOTE — Progress Notes (Signed)
During shift assessment, patient described tremors giving him a bad feeling and sometimes splashes drinks out of cups because of tremor. Stated lies down for 1.5 hours when develops tremors at home. Upon inquiry patient stated drinks 3 beers per week and has tremors prior to drinking and tremors decrease with drinking. Patient inquired what causes tremors, informed patient ETOH withdrawal can cause tremors. Patient stated has a neighbor, two sisters and a niece that check in on him.   Spoke with niece Aariz Viebrock via phone at 1250. Informed niece unsure of discharge date/time, however, at this time have no discharge orders. Informed niece patient's VS stable, patient eating well, able to dress himself with minimal assistance, sitting in chair watching TV and worked with PT using gait belt and steady x1 assist. Informed recommended that patient use cane to prevent falls per PT note.   Upon rounding at 1315 patient and RN inquiry patient stated not ok because he thought he was going home. Patient stated he was told he was going home today. Informed patient understanding was he would be d/c either today or tomorrow 10/20. Paged MD Coe at [phone].   1329 spoke with [facility] via phone, informed not safe to d/c patient with Librium taper and concerns for underlying dementia. Coe indicated prior plan was to possibly d/c today, but given Librium need to remain admitted. Informed Coe to follow up with SW and CM about continuing IVC given discussion regarding removing IVC during progression.  1335 spoke with patient, patient upset thought was going home, informed patient of MD Coe plan to d/c 1-2 days due to Librium. Patient stated has neighbor that can look after him, informed patient he needs medical supervision given medication.  Patient requested talk to his sister in [place] and upon inquiry patient stated wanted to talk to MD. Paged Marianna Payment at [place]  Upon reassessment, after patient took a nap, patient stated "I  understand what you mean" in reference to safety concerns and needing to walk with assistance. Inquired if patient felt sleepy/drowsy; patient stated felt a little sleepy. Informed patient Librium can cause some drowsiness. Assisted patient to chair.   Spoke with doctor Basaraba at [employer] and informed able to calm patient and explain discharge plan when he became upset earlier in the day, however, to ensure MDs in AM communicate discharge plan to patient in morning to minimize frustration.   During 1800 reassessment, patient somewhat more unsteady on feet when ambulating/transferring from chair to bed. Disoriented to place stating "needing to go back to his room" however informed patient currently in hospital room, patient easily reoriented.   Received call from niece at approximately 1820 and provided updates since last call. Informed [place] patient may be discharged 10/20 or 10/21 due to patient receiving Librium medication and needing monitoring. Informed patient somewhat unsteady and drowsy at this time, however, could be attributed to Librium.

## 2019-02-18 NOTE — Evaluation (Signed)
Physical Therapy Evaluation Patient Details Name: Kacen Fossen MRN: [id number] DOB: 17-Mar-1951 Today's Date: 02/18/2019   History of Present Illness  68 yo F w/ a PMHx notable for HTN, EtOH abuse, and BPH who presents to [facility] internal medicine clinic for evaluation of tremor that is worsened over the past 24 hours and is associated with diaphoresis and hallucinations.  Clinical Impression  Pt demonstrates deficits in higher level gait and balance tasks, however is able to perform bed mobility, transfer, and ambulate without physical assistance. Pt ambulates with a slow yet functional gait speed, able to perform head turns, yet unable to significantly change his gait speed. Pt will benefit from continued acute PT services to improve upon dynamic gait and balance tasks. PT recommends discharge home with no PT or DME needs, pt should utilize cane at home to reduce falls risk.    Follow Up Recommendations No PT follow up    Equipment Recommendations  None recommended by PT    Recommendations for Other Services       Precautions / Restrictions Precautions Precautions: Fall Restrictions Weight Bearing Restrictions: No      Mobility  Bed Mobility Overal bed mobility: Independent                Transfers Overall transfer level: Independent Equipment used: None                Ambulation/Gait Ambulation/Gait assistance: Min guard;Supervision(minG progressing to supervision) Gait Distance (Feet): 150 Feet Assistive device: None Gait Pattern/deviations: Step-through pattern;Decreased stride length;Drifts right/left Gait velocity: functional Gait velocity interpretation: 1.31 - 2.62 ft/sec, indicative of limited community ambulator General Gait Details: pt with shortened step through gait, pt unable to significantly change gait speed, performing head turns with increased sway and drift but no LOB.  Stairs            Wheelchair Mobility    Modified Rankin  (Stroke Patients Only)       Balance Overall balance assessment: Needs assistance Sitting-balance support: Feet supported;Bilateral upper extremity supported Sitting balance-Leahy Scale: Fair Sitting balance - Comments: supervision for dynamic sitting balance   Standing balance support: No upper extremity supported Standing balance-Leahy Scale: Good Standing balance comment: supervision for static and dynamic standing balance                             Pertinent Vitals/Pain Pain Assessment: No/denies pain    Home Living Family/patient expects to be discharged to:: Private residence Living Arrangements: Alone Available Help at Discharge: Family;Available PRN/intermittently Type of Home: House Home Access: Level entry     Home Layout: One level Home Equipment: Cane - single point      Prior Function Level of Independence: Needs assistance   Gait / Transfers Assistance Needed: pt requires assistance for ambulation when experiencing tremors (reports they happen ~1 week per month)  ADL's / Homemaking Assistance Needed: Independent        Hand Dominance        Extremity/Trunk Assessment   Upper Extremity Assessment Upper Extremity Assessment: Overall WFL for tasks assessed    Lower Extremity Assessment Lower Extremity Assessment: Overall WFL for tasks assessed       Communication   Communication: No difficulties  Cognition Arousal/Alertness: Awake/alert Behavior During Therapy: WFL for tasks assessed/performed Overall Cognitive Status: Within Functional Limits for tasks assessed  General Comments      Exercises     Assessment/Plan    PT Assessment Patient needs continued PT services  PT Problem List Decreased balance;Decreased mobility;Decreased knowledge of use of DME       PT Treatment Interventions Gait training;Neuromuscular re-education;Balance training    PT Goals (Current  goals can be found in the Care Plan section)  Acute Rehab PT Goals Patient Stated Goal: To go home and stop drinking PT Goal Formulation: With patient Time For Goal Achievement: 03/04/19 Potential to Achieve Goals: Good Additional Goals Additional Goal #1: Pt will maintain dynamic standing balance within 10 inches of his base of support modI, utilizing the least restrictive assistive device, to aide in performance of ADLs and reduce falls risk.    Frequency Min 2X/week   Barriers to discharge        Co-evaluation               AM-PAC PT "6 Clicks" Mobility  Outcome Measure Help needed turning from your back to your side while in a flat bed without using bedrails?: None Help needed moving from lying on your back to sitting on the side of a flat bed without using bedrails?: None Help needed moving to and from a bed to a chair (including a wheelchair)?: None Help needed standing up from a chair using your arms (e.g., wheelchair or bedside chair)?: None Help needed to walk in hospital room?: None Help needed climbing 3-5 steps with a railing? : None 6 Click Score: 24    End of Session Equipment Utilized During Treatment: Gait belt Activity Tolerance: Patient tolerated treatment well Patient left: in chair;with call bell/phone within reach;with chair alarm set Nurse Communication: Mobility status PT Visit Diagnosis: Other abnormalities of gait and mobility (R26.89)    Time: [id number] PT Time Calculation (min) (ACUTE ONLY): 15 min   Charges:   PT Evaluation $PT Eval Low Complexity: [place], PT, DPT Acute Rehabilitation Pager: [phone]   Zenaida Niece 02/18/2019, 11:36 AM

## 2019-02-19 ENCOUNTER — Telehealth: Payer: Self-pay | Admitting: Internal Medicine

## 2019-02-19 ENCOUNTER — Other Ambulatory Visit: Payer: Self-pay | Admitting: Internal Medicine

## 2019-02-19 DIAGNOSIS — G250 Essential tremor: Secondary | ICD-10-CM

## 2019-02-19 NOTE — Progress Notes (Signed)
   Subjective: Patient was seen this morning on rounds. Patient is alert and oriented to person, place, time, and event. He states that he is feeling much better. He denies any new symptoms inculding denies dizziness, weakness, chest pain, confusion, anxiety.   Objective:  Vital signs in last 24 hours: Vitals:   02/19/19 0511 02/19/19 0903 02/19/19 1054 02/19/19 1128  BP: 108/62 119/68 (!) 90/59 98/72  Pulse: 65 75 75   Resp: 18     Temp: 97.9 F (36.6 C) 97.6 F (36.4 C)    TempSrc: Oral Oral    SpO2: 100% 100%    Weight: 55.7 kg     Height:        Physical Exam: Physical Exam  Constitutional: He is oriented to person, place, and time. No distress.  HENT:  Head: Atraumatic.  Eyes: EOM are normal.  Neck: Normal range of motion.  Cardiovascular: Normal rate, regular rhythm, normal heart sounds and intact distal pulses. Exam reveals no gallop and no friction rub.  No murmur heard. Pulmonary/Chest: Effort normal. No respiratory distress. He exhibits no tenderness.  Abdominal: Soft. There is no abdominal tenderness.  Musculoskeletal: Normal range of motion.        General: No tenderness or edema.  Neurological: He is alert and oriented to person, place, and time.  Skin: Skin is warm and dry. He is not diaphoretic.     Pertinent labs/Imaging: none   Assessment/Plan:  Active Problems:   Malnutrition of moderate degree   Alcohol withdrawal delirium [facility])    Patient Summary: 68-year-old male with pertinent past medical history of alcohol use disorder presented to [facility] health with signs and symptoms concerning for alcohol withdrawal.  #Alcohol withdrawal: [place] patient's alcohol withdrawal symptoms have substantially improved at this time.  He is alert and oriented to person place time and event.  He does not appear to have any apparent tremors.  He denies visual hallucinations at this time.  He does not appear agitated/anxious.  Patient is stable and ready for  discharge. - We will give him one last dose of Librium 50 mg today   #HTN: Patient had single episode of low blood pressures at night 90s/50s.  Repeat blood pressure was 107/73.  Considering recent AKI will discontinue lisinopril but keep patient on metoprolol and on amlodipine.  Patient will go home on these medications and follow-up in the clinic to restart lisinopril as needed. - Continue Metoprolol and lisinopril  #AKI - Resolved   Diet: Regular IVF: none VTE: Enoxaparin Code: Full code  Dispo: Anticipated discharge today.  Marianna Payment, MD 02/19/2019, 6:33 PM Pager: [phone]

## 2019-02-19 NOTE — Telephone Encounter (Signed)
HFU per Dr Garlan Fillers; pt 10/23 915am./nw

## 2019-02-19 NOTE — Progress Notes (Signed)
notified MD Coe at 1055 patient BP 90/59, ok to hold HTN meds and repeat BP and notify MD before discharge.  Paged Coe at 1130 and notified patient BP reassessed 98/72 (80). HR 74. Spoke with Coe via phone; patient ok to be discharged and AVS ready to be printed.   Spoke with patient's niece Tsuyoshi Whary via phone and notified patient ready for discharge at approx 1140.

## 2019-02-19 NOTE — Progress Notes (Signed)
  Date: 02/19/2019  Patient name: Dan Henry  Medical record number: [id number]  Date of birth: 18-Jan-1951        I have seen and evaluated this patient and I have discussed the plan of care with the house staff. Please see their note for complete details. I concur with their findings.  Patient will finish his librium taper today and discharge home. He has not required any prn ativan in over 48 hours, CIWA scores have been low, 0-5. He does have a chronic essential tremor which complicates his clinical picture. Overall he is very stable. Will resume his naltrexone and discharge with [facility] follow up.   Rest per resident note.   Velna Ochs, MD 02/19/2019, 6:55 PM

## 2019-02-19 NOTE — Progress Notes (Signed)
[name] to be D/C'd Home per MD order.  Discussed with the patient and all questions fully answered.  VSS, Skin clean, dry and intact without evidence of skin break down, no evidence of skin tears noted.  IV catheter discontinued intact. Site without signs and symptoms of complications. Dressing and pressure applied.  An After Visit Summary was printed and given to the patient. Patient received prescription.  D/c education completed with patient/family including follow up instructions, medication list, d/c activities limitations if indicated, with other d/c instructions as indicated by MD - patient able to verbalize understanding, all questions fully answered.   Patient instructed to return to ED, call 911, or call MD for any changes in condition.   Patient escorted via [place] by NT to main entrance, and D/C home via private auto (niece Lavin Mcannally).   Howard Pouch 02/19/2019 7:55 PM

## 2019-02-19 NOTE — Progress Notes (Signed)
CSW has faxed rescinded IVC paperwork to Magistrate that was signed by MD, patient no longer under IVC. Patient able to be discharged at this time. Paperwork on chart.   [place], [place]

## 2019-02-19 NOTE — Consult Note (Signed)
   [facility] CM Inpatient Consult   02/19/2019  Dan Henry 20-Feb-1951 [id number]    Patient's chart checked for 17% risk score for unplanned readmission and 2 hospitalizations in the past 6 months; and to check for potential [place] Management services needed under his [employer] benefits, however, patient is currently active with an external care management program ([employer]), who follows and provides his care management needs.  Membership roster used to check current status.  Plan: Notify [employer] coordinator of disposition.  Will sign off.   For questions and additional information, please contact:  Leanna Hamid A. Enmanuel Zufall, BSN, RN-BC [facility] Liaison Cell: [phone]

## 2019-02-20 NOTE — Discharge Planning (Deleted)
Name: Dan Henry MRN: [id number] DOB: [birth year] 68 y.o. PCP: Dan Rosenthal, MD  Date of Admission: 02/14/2019  4:25 PM Date of Discharge: 02/19/2019 Attending Physician: No att. providers found  Discharge Diagnosis: 1. Alcohol Withdrawal   Discharge Medications: Allergies as of 02/19/2019   No Known Allergies     Medication List    STOP taking these medications   lisinopril 10 MG tablet Commonly known as: ZESTRIL     TAKE these medications   amLODipine 10 MG tablet Commonly known as: NORVASC TAKE 1 TABLET BY MOUTH ONCE DAILY   atorvastatin 40 MG tablet Commonly known as: LIPITOR Take 1 tablet (40 mg total) by mouth daily. What changed: when to take this   feeding supplement (ENSURE ENLIVE) Liqd Take 237 mLs by mouth 2 (two) times daily between meals.   metoprolol tartrate 50 MG tablet Commonly known as: LOPRESSOR Take 1 tablet (50 mg total) by mouth 2 (two) times daily.   multivitamin with minerals Tabs tablet Take 1 tablet by mouth every morning.       Disposition and follow-up:   Dan Henry was discharged from [facility] in Good condition.  At the hospital follow up visit please address:  1.  Follow up:  (1) Alcohol Withdrawal - please make sure she is taking his naltrexone appropriately.  (2) AKI -although patients AKI resolved during hospital admission patient may benefit from repeating BMP in outpatient setting.  (3) HTN -patient was restarted on antihypertensive medications including lisinopril, amlodipine, metoprolol.  Patient's blood pressures were relatively low with an [id number] systolic.  Lisinopril was continued during the hospital course due to AKI.  Please assess need for blood pressure medications and restart them accordingly.  2.  Labs / imaging needed at time of follow-up: BMP  3.  Pending labs/ test needing follow-up: none  Follow-up Appointments:   Hospital Course by problem list: 21 without 68 year old male  with a pertinent past medical history of hypertension, alcohol use disorder, benign prostate hyperplasia who presented to [facility] with signs and symptoms concerning for alcohol withdrawal.  1.  Patient presented to [facility] with tremors, diaphoresis and visual hallucinations concerning for alcohol withdrawals.  Patient states that he/she consumes roughly 2 -3 16 ounce beers daily. He states that he wanted to stay in patient for withdrawal treatment.  Patient's niece states that he has not had ETOH withdrawals for some time since starting Naltrexone.  Patient's niece is concerned for underlying dementia which may be contributing to his medication noncompliance leading up to this hospital visit. During his hospital course the patient became disoriented and significantly agitated to the point where we were concerned for the patient's safety.  The patient was subsequently deemed in involuntarily committed to finish his withdrawal treatment. patient was treated with Ativan and Librium with resolution of his withdrawal symptoms.  2. Acute kidney Injury -patient developed an acute kidney injury during the hospital course likely secondary to decreased p.o. intake.  Patient was fluid responsive with resolution of his AKI.  3.  Hypertension -patient was discharged with amlodipine and metoprolol.  Blood pressures were around [id number] systolic.  Patient's lisinopril was discontinued in the setting of acute kidney injury.  He will need further follow-up to determine whether or not he can tolerate lisinopril moving forward.  Discharge Vitals:   BP 98/72 (BP Location: Left Arm)   Pulse 75   Temp 97.6 F (36.4 C) (Oral)   Resp 18   Ht 5\' 4"  (  1.626 m)   Wt 55.7 kg Comment: scale b  SpO2 100%   BMI 21.10 kg/m   Pertinent Labs, Studies, and Procedures:  CBC Latest Ref Rng & Units 02/18/2019 02/16/2019 02/15/2019  WBC 4.0 - 10.5 K/uL 4.3 7.2 5.6  Hemoglobin 13.0 - 17.0 g/dL 9.4(L) 9.8(L) 10.6(L)  Hematocrit 39.0  - 52.0 % 28.6(L) 30.0(L) 31.3(L)  Platelets 150 - 400 K/uL 193 210 244   CMP Latest Ref Rng & Units 02/18/2019 02/17/2019 02/16/2019  Glucose 70 - 99 mg/dL 113(H) 96 103(H)  BUN 8 - 23 mg/dL 15 14 14   Creatinine 0.61 - 1.24 mg/dL 1.08 0.99 0.91  Sodium 135 - 145 mmol/L 138 139 137  Potassium 3.5 - 5.1 mmol/L 3.5 3.6 4.0  Chloride 98 - 111 mmol/L 108 107 106  CO2 22 - 32 mmol/L 20(L) 23 21(L)  Calcium 8.9 - 10.3 mg/dL 8.6(L) 8.8(L) 8.7(L)  Total Protein 6.5 - 8.1 g/dL - - -  Total Bilirubin 0.3 - 1.2 mg/dL - - -  Alkaline Phos 38 - 126 U/L - - -  AST 15 - 41 U/L - - -  ALT 0 - 44 U/L - - -     Discharge Instructions: Discharge Instructions    Diet - low sodium heart healthy   Complete by: As directed    Discharge instructions   Complete by: As directed    You were hospitalized for Alcohol Withdrawal and Acute Kidney Injury. Thank you for allowing [place] to be part of your care.   We arranged for you to follow up at:   (1) [facility] on Friday 10/23 at 9:45am.   Please note these changes made to your medications:   Please START taking:   (1) Resume home medications except what is listed below. (2) Please continue librium medications as prescribed.   Please STOP taking: none  Please make sure to Follow up at the [facility] and take all medications as prescribed. If you have any questions please do not hesitate to call us.   Please call our clinic if you have any questions or concerns, we may be able to help and keep you from a long and expensive emergency room wait. Our clinic and after hours phone number is [phone], the best time to call is Monday through Friday 9 am to 4 pm but there is always someone available 24/7 if you have an emergency. If you need medication refills please notify your pharmacy one week in advance and they will send [place] a request.   Increase activity slowly   Complete by: As directed       Signed:  Marianna Payment, MD 02/20/2019, 7:12 AM   Pager: [phone]

## 2019-02-21 NOTE — Telephone Encounter (Addendum)
Clarified with Dr. Marianna Payment that patient should have stopped lisinopril and librium. Attempted to reach patient for [facility] call. No answer and mailbox is full and cannot receive messages. Patient r/s appt till 02/26/2019. Will make second attempt tomorrow. Hubbard Hartshorn, BSN, RN-BC

## 2019-02-22 ENCOUNTER — Ambulatory Visit: Payer: Medicare HMO

## 2019-02-26 ENCOUNTER — Other Ambulatory Visit: Payer: Self-pay

## 2019-02-26 ENCOUNTER — Ambulatory Visit (INDEPENDENT_AMBULATORY_CARE_PROVIDER_SITE_OTHER): Payer: Medicare HMO | Admitting: Internal Medicine

## 2019-02-26 VITALS — BP 151/79 | HR 64 | Temp 98.2°F | Ht 64.0 in | Wt 126.6 lb

## 2019-02-26 DIAGNOSIS — D649 Anemia, unspecified: Secondary | ICD-10-CM | POA: Diagnosis not present

## 2019-02-26 DIAGNOSIS — I10 Essential (primary) hypertension: Secondary | ICD-10-CM

## 2019-02-26 DIAGNOSIS — F1021 Alcohol dependence, in remission: Secondary | ICD-10-CM

## 2019-02-26 DIAGNOSIS — Z79899 Other long term (current) drug therapy: Secondary | ICD-10-CM | POA: Diagnosis not present

## 2019-02-26 DIAGNOSIS — Z1211 Encounter for screening for malignant neoplasm of colon: Secondary | ICD-10-CM

## 2019-02-26 DIAGNOSIS — Z0000 Encounter for general adult medical examination without abnormal findings: Secondary | ICD-10-CM

## 2019-02-26 MED ORDER — AMLODIPINE BESYLATE 10 MG PO TABS: 10.0000 mg | ORAL_TABLET | Freq: Every day | ORAL | 2 refills | Status: DC

## 2019-02-26 MED ORDER — METOPROLOL TARTRATE 50 MG PO TABS: 50.0000 mg | ORAL_TABLET | Freq: Two times a day (BID) | ORAL | 5 refills | Status: DC

## 2019-02-26 NOTE — Assessment & Plan Note (Signed)
Patient noted to have asymptomatic anemia on recent labs. Unclear etiology, possibly nutritional deficiency verses marrow suppression given chronic alcohol use. No bleeding reported. Will refer patient for screen conlonscopy and have him follow up with PCP, - Colonoscopy - Consider Repeat CBC at future visit to monitor for improvement now that he has stopped drinking alcohol.

## 2019-02-26 NOTE — Assessment & Plan Note (Addendum)
Patient's BP is elevated today to the [id number] systolic, he states this is consistent with he measurement's he has been getting at home. His Lisinopril was stopped during recent admission for alcohol withdrawal due to AKI and soft BP. Given BP elevation today will restart Lisinopril provided repeat BMP looks okay. - Metoprolol 50mg  BID - Amlodipine 10mg  Daily - Lisinopril 10mg  Daily, provided BMP looks okay - BMP  ADDENDUM: BMP WNL, will resume Lisinopril and have patient return in 1 month. Patient updated by phone.

## 2019-02-26 NOTE — Patient Instructions (Addendum)
Thank you for allowing [place] to care for you  For your alcohol use - Continue daily Naltrexone - We will refer you to our counselor - Keep up the good work!!  For your blood pressure - BP is elevated - Refills today' - We will restart lisinopril after blood work comes back  Colonoscopy ordered  Please follow up with PCP in about 2 months

## 2019-02-26 NOTE — Progress Notes (Signed)
   CC: Alcohol Use, Anemia, Healthcare Maintenance, Hospital follow up  HPI:   [name] is a 68 y.o. M with PMHx listed below presenting for Alcohol Use, Anemia, Healthcare Maintenance, Hospital follow up. Please see the A&P for the status of the patient's chronic medical problems.   Past Medical History:  Diagnosis Date  . Hypertension    Review of Systems:  Performed and all others negative.  Physical Exam:  Vitals:   02/26/19 0933  BP: (!) 151/79  Pulse: 64  Temp: 98.2 F (36.8 C)  TempSrc: Oral  SpO2: 100%  Weight: 126 lb 9.6 oz (57.4 kg)  Height: 5\' 4"  (1.626 m)   Physical Exam Constitutional:      General: He is not in acute distress.    Appearance: Normal appearance.     Comments: Thin male  Cardiovascular:     Rate and Rhythm: Normal rate and regular rhythm.     Pulses: Normal pulses.     Heart sounds: Normal heart sounds.  Pulmonary:     Effort: Pulmonary effort is normal. No respiratory distress.     Breath sounds: Normal breath sounds.  Abdominal:     General: Bowel sounds are normal. There is no distension.     Palpations: Abdomen is soft.     Tenderness: There is no abdominal tenderness.  Musculoskeletal:        General: No swelling or deformity.  Skin:    General: Skin is warm and dry.  Neurological:     General: No focal deficit present.     Mental Status: Mental status is at baseline.    Assessment & Plan:   See Encounters Tab for problem based charting.  Patient discussed with Dr. Lynnae January

## 2019-02-26 NOTE — Assessment & Plan Note (Signed)
Colonoscopy referral placed

## 2019-02-26 NOTE — Assessment & Plan Note (Signed)
Patient presents for follow up after admission for alcohol withdrawal. This was his second admission for withdrawal this year. He was motivated on admission to quit alcohol and after discharge has done well. He has no further hallucinations nor tremor. He states his appetite is improved and he has been eating more. He also has been exercising more.  He was started on Naltrexone on discharge and he has continued to take this daily. He states he is happy with his decision to quit drinking and his craving are controlled. He reports having som up and down feelings since discharge and we discussed referral to a counselor, which he felt was a good idea. We will continue to monitor his progress with IBH and PCP follow up - Referral to IBH - Naltrexone 50mg  Daily

## 2019-02-27 ENCOUNTER — Telehealth: Payer: Self-pay

## 2019-02-27 LAB — BMP8+ANION GAP
Anion Gap: 17 mmol/L (ref 10.0–18.0)
BUN/Creatinine Ratio: 7 — ABNORMAL LOW (ref 10–24)
BUN: 8 mg/dL (ref 8–27)
CO2: 22 mmol/L (ref 20–29)
Calcium: 9.5 mg/dL (ref 8.6–10.2)
Chloride: 104 mmol/L (ref 96–106)
Creatinine, Ser: 1.08 mg/dL (ref 0.76–1.27)
GFR calc Af Amer: 81 mL/min/{1.73_m2} (ref >59)
GFR calc non Af Amer: 70 mL/min/{1.73_m2} (ref >59)
Glucose: 91 mg/dL (ref 65–99)
Potassium: 4.3 mmol/L (ref 3.5–5.2)
Sodium: 143 mmol/L (ref 134–144)

## 2019-02-27 MED ORDER — LISINOPRIL 10 MG PO TABS: 10.0000 mg | ORAL_TABLET | Freq: Every day | ORAL | 2 refills | Status: DC

## 2019-02-27 NOTE — Progress Notes (Signed)
Internal Medicine Clinic Attending  Case discussed with Dr. Trilby Drummer at the time of the visit.  We reviewed the resident's history and exam and pertinent patient test results.  I agree with the assessment, diagnosis, and plan of care documented in the resident's note.  Sent message to front desk to schedule 1 month follow-up.

## 2019-02-27 NOTE — Telephone Encounter (Signed)
Pt's niece, [name], wants to know about pt's appt yesterday with Dr Trilby Drummer. Stated she was unable to come with him - phone# [phone]. Thanks

## 2019-02-27 NOTE — Addendum Note (Signed)
Addended by: Neva Seat B on: 02/27/2019 10:11 AM   Modules accepted: Orders

## 2019-02-27 NOTE — Telephone Encounter (Signed)
Spoke with patient's Niece about his visit yesterday. She was also informed of his normal BMP and plan to restart Lisinopril as there is a system wide DPR on file and I was unable to reach the patient by phone.  She will update the patient. They are to schedule a follow up in about 1 month for BP check, dose adjustment, and possible lab work.  She states there is some concern about memory disturbance, but it was unclear if was related to his acute illness, long term damage from alcohol use, or something else. This will need to be addressed at follow up visit, likely would be best to do so with PCP in January.

## 2019-02-27 NOTE — Telephone Encounter (Signed)
Pt's niece requesting to speak with a nurse. Please call back.  

## 2019-02-27 NOTE — Telephone Encounter (Signed)
Pt came to his appt yesterday; seen by Dr Trilby Drummer.

## 2019-02-28 ENCOUNTER — Encounter: Payer: Self-pay | Admitting: Licensed Clinical Social Worker

## 2019-02-28 ENCOUNTER — Telehealth: Payer: Self-pay | Admitting: Licensed Clinical Social Worker

## 2019-02-28 NOTE — Discharge Summary (Signed)
Name: Dan Henry MRN: [id number] DOB: [birth year] 68 y.o. PCP: [name], MD  Date of Admission: 02/14/2019  4:25 PM Date of Discharge: 02/19/2019 Attending Physician: Dr. Philipp Ovens   Discharge Diagnosis: 1. Alcohol Withdrawal   Discharge Medications: Allergies as of 02/19/2019   No Known Allergies     Medication List    STOP taking these medications   lisinopril 10 MG tablet Commonly known as: ZESTRIL     TAKE these medications   amLODipine 10 MG tablet Commonly known as: NORVASC TAKE 1 TABLET BY MOUTH ONCE DAILY   atorvastatin 40 MG tablet Commonly known as: LIPITOR Take 1 tablet (40 mg total) by mouth daily. What changed: when to take this   feeding supplement (ENSURE ENLIVE) Liqd Take 237 mLs by mouth 2 (two) times daily between meals.   metoprolol tartrate 50 MG tablet Commonly known as: LOPRESSOR Take 1 tablet (50 mg total) by mouth 2 (two) times daily.   multivitamin with minerals Tabs tablet Take 1 tablet by mouth every morning.       Disposition and follow-up:   Mr.Yassir Boulet was discharged from [facility] in Good condition.  At the hospital follow up visit please address:  1.  Follow up:  (1) Alcohol Withdrawal - please make sure she is taking his naltrexone appropriately.  (2) AKI -although patients AKI resolved during hospital admission patient may benefit from repeating BMP in outpatient setting.  (3) HTN -patient was restarted on antihypertensive medications including lisinopril, amlodipine, metoprolol.  Patient's blood pressures were relatively low with an A999333 systolic.  Lisinopril was continued during the hospital course due to AKI.  Please assess need for blood pressure medications and restart them accordingly.  2.  Labs / imaging needed at time of follow-up: BMP  3.  Pending labs/ test needing follow-up: none  Follow-up Appointments:   Hospital Course by problem list: 31 without 68 year old male with a  pertinent past medical history of hypertension, alcohol use disorder, benign prostate hyperplasia who presented to [facility] with signs and symptoms concerning for alcohol withdrawal.  1.  Patient presented to [facility] with tremors, diaphoresis and visual hallucinations concerning for alcohol withdrawals.  Patient states that he/she consumes roughly 2 -3 16 ounce beers daily. He states that he wanted to stay in patient for withdrawal treatment.  Patient's niece states that he has not had ETOH withdrawals for some time since starting Naltrexone.  Patient's niece is concerned for underlying dementia which may be contributing to his medication noncompliance leading up to this hospital visit. During his hospital course the patient became disoriented and significantly agitated to the point where we were concerned for the patient's safety.  The patient was subsequently deemed in involuntarily committed to finish his withdrawal treatment. patient was treated with Ativan and Librium with resolution of his withdrawal symptoms.  2. Acute kidney Injury -patient developed an acute kidney injury during the hospital course likely secondary to decreased p.o. intake.  Patient was fluid responsive with resolution of his AKI.  3.  Hypertension -patient was discharged with amlodipine and metoprolol.  Blood pressures were around A999333 systolic.  Patient's lisinopril was discontinued in the setting of acute kidney injury.  He will need further follow-up to determine whether or not he can tolerate lisinopril moving forward.  Discharge Vitals:   BP 98/72 (BP Location: Left Arm)   Pulse 75   Temp 97.6 F (36.4 C) (Oral)   Resp 18   Ht 5\' 4"  (1.626  m)   Wt 55.7 kg Comment: scale b  SpO2 100%   BMI 21.10 kg/m   Pertinent Labs, Studies, and Procedures:  CBC Latest Ref Rng & Units 02/18/2019 02/16/2019 02/15/2019  WBC 4.0 - 10.5 K/uL 4.3 7.2 5.6  Hemoglobin 13.0 - 17.0 g/dL 9.4(L) 9.8(L) 10.6(L)  Hematocrit 39.0 - 52.0  % 28.6(L) 30.0(L) 31.3(L)  Platelets 150 - 400 K/uL 193 210 244   CMP Latest Ref Rng & Units 02/18/2019 02/17/2019 02/16/2019  Glucose 70 - 99 mg/dL 113(H) 96 103(H)  BUN 8 - 23 mg/dL 15 14 14   Creatinine 0.61 - 1.24 mg/dL 1.08 0.99 0.91  Sodium 135 - 145 mmol/L 138 139 137  Potassium 3.5 - 5.1 mmol/L 3.5 3.6 4.0  Chloride 98 - 111 mmol/L 108 107 106  CO2 22 - 32 mmol/L 20(L) 23 21(L)  Calcium 8.9 - 10.3 mg/dL 8.6(L) 8.8(L) 8.7(L)  Total Protein 6.5 - 8.1 g/dL - - -  Total Bilirubin 0.3 - 1.2 mg/dL - - -  Alkaline Phos 38 - 126 U/L - - -  AST 15 - 41 U/L - - -  ALT 0 - 44 U/L - - -     Discharge Instructions: Discharge Instructions    Diet - low sodium heart healthy   Complete by: As directed    Discharge instructions   Complete by: As directed    You were hospitalized for Alcohol Withdrawal and Acute Kidney Injury. Thank you for allowing [place] to be part of your care.   We arranged for you to follow up at:   (1) [facility] on Friday 10/23 at 9:45am.   Please note these changes made to your medications:   Please START taking:   (1) Resume home medications except what is listed below. (2) Please continue librium medications as prescribed.   Please STOP taking: none  Please make sure to Follow up at the [facility] and take all medications as prescribed. If you have any questions please do not hesitate to call us.   Please call our clinic if you have any questions or concerns, we may be able to help and keep you from a long and expensive emergency room wait. Our clinic and after hours phone number is [phone], the best time to call is Monday through Friday 9 am to 4 pm but there is always someone available 24/7 if you have an emergency. If you need medication refills please notify your pharmacy one week in advance and they will send [place] a request.   Increase activity slowly   Complete by: As directed       Signed: Marianna Payment, MD 02/20/2019, 7:12 AM   Pager: [phone]

## 2019-02-28 NOTE — Telephone Encounter (Signed)
Patient was called (1st attempt) to discuss the referral from his doctor. Patient did not answer, and a vm was left for the patient to contact our office to schedule a future appointment.

## 2019-03-04 ENCOUNTER — Encounter: Payer: Self-pay | Admitting: Internal Medicine

## 2019-03-06 ENCOUNTER — Telehealth: Payer: Self-pay | Admitting: Licensed Clinical Social Worker

## 2019-03-06 NOTE — Telephone Encounter (Signed)
Patient was contacted to discuss the referral. Patient declined services.

## 2019-03-06 NOTE — Addendum Note (Signed)
Addended by: Hulan Fray on: 03/06/2019 02:29 PM   Modules accepted: Orders

## 2019-04-03 ENCOUNTER — Encounter: Payer: Self-pay | Admitting: Internal Medicine

## 2019-04-03 ENCOUNTER — Encounter: Payer: Medicare HMO | Admitting: Internal Medicine

## 2019-04-03 NOTE — Addendum Note (Signed)
Addended by: Hulan Fray on: 04/03/2019 06:25 PM   Modules accepted: Orders

## 2019-04-23 ENCOUNTER — Encounter (HOSPITAL_COMMUNITY): Payer: Self-pay | Admitting: Emergency Medicine

## 2019-04-23 ENCOUNTER — Inpatient Hospital Stay (HOSPITAL_COMMUNITY): Admission: AD | Admit: 2019-04-23 | Payer: Medicare HMO | Source: Ambulatory Visit | Admitting: Internal Medicine

## 2019-04-23 ENCOUNTER — Other Ambulatory Visit: Payer: Self-pay

## 2019-04-23 ENCOUNTER — Inpatient Hospital Stay (HOSPITAL_COMMUNITY)
Admission: EM | Admit: 2019-04-23 | Discharge: 2019-04-25 | DRG: 897 | Disposition: A | Discharge status: Home / Self Care | Payer: Medicare HMO | Attending: Internal Medicine | Admitting: Internal Medicine

## 2019-04-23 ENCOUNTER — Ambulatory Visit (INDEPENDENT_AMBULATORY_CARE_PROVIDER_SITE_OTHER): Payer: Medicare HMO | Admitting: Internal Medicine

## 2019-04-23 VITALS — BP 99/60 | HR 74 | Temp 97.7°F | Ht 64.0 in | Wt 125.9 lb

## 2019-04-23 DIAGNOSIS — F10931 Alcohol use, unspecified with withdrawal delirium: Secondary | ICD-10-CM

## 2019-04-23 DIAGNOSIS — R040 Epistaxis: Secondary | ICD-10-CM

## 2019-04-23 DIAGNOSIS — R441 Visual hallucinations: Secondary | ICD-10-CM | POA: Diagnosis present

## 2019-04-23 DIAGNOSIS — F10231 Alcohol dependence with withdrawal delirium: Secondary | ICD-10-CM | POA: Diagnosis not present

## 2019-04-23 DIAGNOSIS — Z03818 Encounter for observation for suspected exposure to other biological agents ruled out: Secondary | ICD-10-CM | POA: Diagnosis not present

## 2019-04-23 DIAGNOSIS — D509 Iron deficiency anemia, unspecified: Secondary | ICD-10-CM | POA: Diagnosis present

## 2019-04-23 DIAGNOSIS — Z7289 Other problems related to lifestyle: Secondary | ICD-10-CM | POA: Diagnosis not present

## 2019-04-23 DIAGNOSIS — F10239 Alcohol dependence with withdrawal, unspecified: Secondary | ICD-10-CM

## 2019-04-23 DIAGNOSIS — Y902 Blood alcohol level of 40-59 mg/100 ml: Secondary | ICD-10-CM | POA: Diagnosis present

## 2019-04-23 DIAGNOSIS — R509 Fever, unspecified: Secondary | ICD-10-CM

## 2019-04-23 DIAGNOSIS — Z79899 Other long term (current) drug therapy: Secondary | ICD-10-CM | POA: Diagnosis not present

## 2019-04-23 DIAGNOSIS — G250 Essential tremor: Secondary | ICD-10-CM | POA: Diagnosis present

## 2019-04-23 DIAGNOSIS — I10 Essential (primary) hypertension: Secondary | ICD-10-CM | POA: Diagnosis present

## 2019-04-23 DIAGNOSIS — K7460 Unspecified cirrhosis of liver: Secondary | ICD-10-CM | POA: Diagnosis not present

## 2019-04-23 DIAGNOSIS — N400 Enlarged prostate without lower urinary tract symptoms: Secondary | ICD-10-CM | POA: Diagnosis not present

## 2019-04-23 DIAGNOSIS — Z8249 Family history of ischemic heart disease and other diseases of the circulatory system: Secondary | ICD-10-CM

## 2019-04-23 DIAGNOSIS — K7010 Alcoholic hepatitis without ascites: Secondary | ICD-10-CM | POA: Diagnosis not present

## 2019-04-23 DIAGNOSIS — D649 Anemia, unspecified: Secondary | ICD-10-CM | POA: Diagnosis not present

## 2019-04-23 DIAGNOSIS — F10939 Alcohol use, unspecified with withdrawal, unspecified: Secondary | ICD-10-CM

## 2019-04-23 DIAGNOSIS — Z20828 Contact with and (suspected) exposure to other viral communicable diseases: Secondary | ICD-10-CM | POA: Diagnosis present

## 2019-04-23 HISTORY — DX: Epistaxis: R04.0

## 2019-04-23 HISTORY — DX: Alcohol dependence with withdrawal, unspecified: F10.239

## 2019-04-23 HISTORY — DX: Alcohol use, unspecified with withdrawal, unspecified: F10.939

## 2019-04-23 LAB — CBC WITH DIFFERENTIAL/PLATELET
Abs Immature Granulocytes: 0.03 10*3/uL (ref 0.00–0.07)
Abs Immature Granulocytes: 0.01 10*3/uL (ref 0.00–0.07)
Basophils Absolute: 0.1 10*3/uL (ref 0.0–0.1)
Basophils Absolute: 0.1 10*3/uL (ref 0.0–0.1)
Basophils Relative: 1 %
Basophils Relative: 1 %
Eosinophils Absolute: 0.1 10*3/uL (ref 0.0–0.5)
Eosinophils Absolute: 0 10*3/uL (ref 0.0–0.5)
Eosinophils Relative: 2 %
Eosinophils Relative: 0 %
HCT: 24.9 % — ABNORMAL LOW (ref 39.0–52.0)
HCT: 23.4 % — ABNORMAL LOW (ref 39.0–52.0)
Hemoglobin: 7.9 g/dL — ABNORMAL LOW (ref 13.0–17.0)
Hemoglobin: 7.3 g/dL — ABNORMAL LOW (ref 13.0–17.0)
Immature Granulocytes: 1 %
Immature Granulocytes: 0 %
Lymphocytes Relative: 16 %
Lymphocytes Relative: 17 %
Lymphs Abs: 1 10*3/uL (ref 0.7–4.0)
Lymphs Abs: 0.9 10*3/uL (ref 0.7–4.0)
MCH: 25.7 pg — ABNORMAL LOW (ref 26.0–34.0)
MCH: 25.8 pg — ABNORMAL LOW (ref 26.0–34.0)
MCHC: 31.7 g/dL (ref 30.0–36.0)
MCHC: 31.2 g/dL (ref 30.0–36.0)
MCV: 81.1 fL (ref 80.0–100.0)
MCV: 82.7 fL (ref 80.0–100.0)
Monocytes Absolute: 0.5 10*3/uL (ref 0.1–1.0)
Monocytes Absolute: 0.3 10*3/uL (ref 0.1–1.0)
Monocytes Relative: 7 %
Monocytes Relative: 5 %
Neutro Abs: 4.6 10*3/uL (ref 1.7–7.7)
Neutro Abs: 3.9 10*3/uL (ref 1.7–7.7)
Neutrophils Relative %: 73 %
Neutrophils Relative %: 77 %
Platelets: 159 10*3/uL (ref 150–400)
Platelets: 144 10*3/uL — ABNORMAL LOW (ref 150–400)
RBC: 3.07 MIL/uL — ABNORMAL LOW (ref 4.22–5.81)
RBC: 2.83 MIL/uL — ABNORMAL LOW (ref 4.22–5.81)
RDW: 19.9 % — ABNORMAL HIGH (ref 11.5–15.5)
RDW: 20.3 % — ABNORMAL HIGH (ref 11.5–15.5)
WBC: 6.3 10*3/uL (ref 4.0–10.5)
WBC: 5.2 10*3/uL (ref 4.0–10.5)
nRBC: 0 % (ref 0.0–0.2)
nRBC: 0 % (ref 0.0–0.2)

## 2019-04-23 LAB — COMPREHENSIVE METABOLIC PANEL
ALT: 33 U/L (ref 0–44)
ALT: 32 U/L (ref 0–44)
AST: 67 U/L — ABNORMAL HIGH (ref 15–41)
AST: 58 U/L — ABNORMAL HIGH (ref 15–41)
Albumin: 4.1 g/dL (ref 3.5–5.0)
Albumin: 3.7 g/dL (ref 3.5–5.0)
Alkaline Phosphatase: 36 U/L — ABNORMAL LOW (ref 38–126)
Alkaline Phosphatase: 31 U/L — ABNORMAL LOW (ref 38–126)
Anion gap: 16 — ABNORMAL HIGH (ref 5–15)
Anion gap: 13 (ref 5–15)
BUN: 16 mg/dL (ref 8–23)
BUN: 15 mg/dL (ref 8–23)
CO2: 17 mmol/L — ABNORMAL LOW (ref 22–32)
CO2: 16 mmol/L — ABNORMAL LOW (ref 22–32)
Calcium: 8.9 mg/dL (ref 8.9–10.3)
Calcium: 8.1 mg/dL — ABNORMAL LOW (ref 8.9–10.3)
Chloride: 102 mmol/L (ref 98–111)
Chloride: 105 mmol/L (ref 98–111)
Creatinine, Ser: 1.32 mg/dL — ABNORMAL HIGH (ref 0.61–1.24)
Creatinine, Ser: 1.38 mg/dL — ABNORMAL HIGH (ref 0.61–1.24)
GFR calc Af Amer: >60 mL/min (ref >60)
GFR calc Af Amer: >60 mL/min (ref >60)
GFR calc non Af Amer: 55 mL/min — ABNORMAL LOW (ref >60)
GFR calc non Af Amer: 52 mL/min — ABNORMAL LOW (ref >60)
Glucose, Bld: 117 mg/dL — ABNORMAL HIGH (ref 70–99)
Glucose, Bld: 117 mg/dL — ABNORMAL HIGH (ref 70–99)
Potassium: 4.1 mmol/L (ref 3.5–5.1)
Potassium: 4.5 mmol/L (ref 3.5–5.1)
Sodium: 135 mmol/L (ref 135–145)
Sodium: 134 mmol/L — ABNORMAL LOW (ref 135–145)
Total Bilirubin: 0.7 mg/dL (ref 0.3–1.2)
Total Bilirubin: 0.9 mg/dL (ref 0.3–1.2)
Total Protein: 7.4 g/dL (ref 6.5–8.1)
Total Protein: 6.6 g/dL (ref 6.5–8.1)

## 2019-04-23 LAB — RAPID URINE DRUG SCREEN, HOSP PERFORMED
Amphetamines: NOT DETECTED
Barbiturates: NOT DETECTED
Benzodiazepines: NOT DETECTED
Cocaine: NOT DETECTED
Opiates: NOT DETECTED
Tetrahydrocannabinol: NOT DETECTED

## 2019-04-23 LAB — GLUCOSE, CAPILLARY: Glucose-Capillary: 106 mg/dL — ABNORMAL HIGH (ref 70–99)

## 2019-04-23 LAB — SARS CORONAVIRUS 2 (TAT 6-24 HRS): SARS Coronavirus 2: NEGATIVE

## 2019-04-23 LAB — ETHANOL: Alcohol, Ethyl (B): 49 mg/dL — ABNORMAL HIGH (ref <10)

## 2019-04-23 MED ORDER — ONDANSETRON 4 MG PO TBDP: 4.0000 mg | ORAL_TABLET | Freq: Four times a day (QID) | ORAL | Status: DC | PRN

## 2019-04-23 MED ORDER — CHLORDIAZEPOXIDE HCL 5 MG PO CAPS
25.0000 mg | ORAL_CAPSULE | Freq: Three times a day (TID) | ORAL | Status: DC
  Administered 2019-04-23 – 2019-04-24 (×2): 25 mg via ORAL
  Filled 2019-04-23: qty 1
  Filled 2019-04-23: qty 5

## 2019-04-23 MED ORDER — SODIUM CHLORIDE 0.9 % IV SOLN
INTRAVENOUS | Status: DC
  Administered 2019-04-23: 100 mL/h via INTRAVENOUS

## 2019-04-23 MED ORDER — ADULT MULTIVITAMIN W/MINERALS CH
1.0000 | ORAL_TABLET | Freq: Every day | ORAL | Status: DC
  Administered 2019-04-23 – 2019-04-25 (×3): 1 via ORAL
  Filled 2019-04-23 (×3): qty 1

## 2019-04-23 MED ORDER — LORAZEPAM 2 MG/ML IJ SOLN
1.0000 mg | INTRAMUSCULAR | Status: DC | PRN
  Administered 2019-04-24: 2 mg via INTRAVENOUS
  Administered 2019-04-25: 1 mg via INTRAVENOUS
  Filled 2019-04-23 (×2): qty 1

## 2019-04-23 MED ORDER — FOLIC ACID 1 MG PO TABS
1.0000 mg | ORAL_TABLET | Freq: Every day | ORAL | Status: DC
  Administered 2019-04-23 – 2019-04-25 (×3): 1 mg via ORAL
  Filled 2019-04-23 (×3): qty 1

## 2019-04-23 MED ORDER — THIAMINE HCL 100 MG PO TABS
100.0000 mg | ORAL_TABLET | Freq: Every day | ORAL | Status: DC
  Administered 2019-04-23 – 2019-04-25 (×3): 100 mg via ORAL
  Filled 2019-04-23 (×3): qty 1

## 2019-04-23 MED ORDER — LOPERAMIDE HCL 2 MG PO CAPS: 2.0000 mg | ORAL_CAPSULE | ORAL | Status: DC | PRN

## 2019-04-23 MED ORDER — ENOXAPARIN SODIUM 40 MG/0.4ML ~~LOC~~ SOLN
40.0000 mg | SUBCUTANEOUS | Status: DC
  Administered 2019-04-23 – 2019-04-24 (×2): 40 mg via SUBCUTANEOUS
  Filled 2019-04-23 (×2): qty 0.4

## 2019-04-23 MED ORDER — LORAZEPAM 1 MG PO TABS: 1.0000 mg | ORAL_TABLET | ORAL | Status: DC | PRN

## 2019-04-23 MED ORDER — CHLORDIAZEPOXIDE HCL 25 MG PO CAPS
25.0000 mg | ORAL_CAPSULE | Freq: Once | ORAL | Status: AC
  Administered 2019-04-23: 15:00:00 25 mg via ORAL
  Filled 2019-04-23: qty 1

## 2019-04-23 NOTE — Assessment & Plan Note (Addendum)
Patient reports having visual hallucinations for about 1 week. He sees faces in the wall that talk to him but he cannot hear them. He is aware these images are not real, but they are distressing for him. Episodes lasts about 1 hour, sometimes longer. He leaves the house and goes for a walk and when he comes back they are no there anymore. States he has never had this happened to him before, but he was seen for this in 06/2018. He will sometime get a light HA but it is not associated with episodes. Reports subjective fevers, slight chills, and sweating at times. Denies vision changes, GI and urinary symptoms. No falls.  No recent changes in medications. Denies substance use. States he drinks alcohol every now and then, about 1-2x/week ("just 1 can of beer"). However, I see in his chart he has a history severe AUD and used to be on naltrexone, but not sure he is taking it. He has also been admitted for alcohol withdrawal.   Initially thought this is secondary to underlying psychiatric illness versus substance abuse. However, patient became diaphoretic, dizzy, and tremulous while he was walking. VS are stable. Orthostatic VS negative. CBG 106. Suspect symptoms are secondary to alcohol withdrawal. Will admit for inpatient treatment of alcohol withdrawal. Case discussed with Dr. Koleen Distance.

## 2019-04-23 NOTE — ED Provider Notes (Signed)
[facility] EMERGENCY DEPARTMENT Provider Note   CSN: [id number] Arrival date & time: 04/23/19  1705     History Chief Complaint  Patient presents with  . Alcohol Problem  . admission    Dan Henry is a 68 y.o. male.  Patient presents to the ED for preadmission COVID screening. Patient has been seen in the internal medicine clinic and is being admitted for alcohol withdrawal. He has been having visual hallucinations for one week. He is not suicidal.  The history is provided by the patient and medical records.  Alcohol Problem This is a recurrent problem. The current episode started 12 to 24 hours ago.       Past Medical History:  Diagnosis Date  . Alcohol withdrawal delirium ([place]) 02/14/2019  . Hypertension   . Hypokalemia 07/19/2017  . Malnutrition of moderate degree 10/24/2018    Patient Active Problem List   Diagnosis Date Noted  . Nosebleed 04/23/2019  . Alcohol withdrawal ([place]) 04/23/2019  . Anemia 02/26/2019  . Urinary retention 10/25/2018  . Alcohol use disorder, severe, in early remission ([place]) 10/23/2018  . Hepatic hemangioma 10/19/2018  . Visual hallucinations 06/26/2018  . Abnormal TSH 06/26/2018  . Need for hepatitis B vaccination 02/21/2018  . Increased anion gap metabolic acidosis [id number]  . Unintentional weight loss 02/16/2018  . Benign essential tremor 07/19/2017  . Elevated LFTs 05/16/2017  . ASCVD (arteriosclerotic cardiovascular disease) 05/16/2017  . Right carotid bruit 05/08/2017  . Essential hypertension 05/21/2015  . Healthcare maintenance 05/21/2015    Past Surgical History:  Procedure Laterality Date  . NO PAST SURGERIES         Family History  Problem Relation Age of Onset  . Hypertension Mother   . Hypertension Father   . Hypertension Sister   . Hypertension Sister   . Stroke Sister   . Cancer Brother        throat    Social History   Tobacco Use  . Smoking status: Never Smoker  . Smokeless  tobacco: Never Used  Substance Use Topics  . Alcohol use: Yes    Alcohol/week: 4.0 standard drinks    Types: 4 Standard drinks or equivalent per week    Comment: occ  . Drug use: No    Home Medications Prior to Admission medications   Medication Sig Start Date End Date Taking? Authorizing Provider  amLODipine (NORVASC) 10 MG tablet Take 1 tablet (10 mg total) by mouth daily. 02/26/19   Neva Seat, MD  atorvastatin (LIPITOR) 40 MG tablet Take 1 tablet (40 mg total) by mouth daily. Patient taking differently: Take 40 mg by mouth at bedtime.  07/03/18   Oval Linsey, MD  feeding supplement, ENSURE ENLIVE, (ENSURE ENLIVE) LIQD Take 237 mLs by mouth 2 (two) times daily between meals. 10/28/18   [name], MD  lisinopril (ZESTRIL) 10 MG tablet Take 1 tablet (10 mg total) by mouth daily. 02/27/19 02/27/20  Neva Seat, MD  metoprolol tartrate (LOPRESSOR) 50 MG tablet Take 1 tablet (50 mg total) by mouth 2 (two) times daily. 02/26/19 03/28/19  Neva Seat, MD  Multiple Vitamin (MULTIVITAMIN WITH MINERALS) TABS tablet Take 1 tablet by mouth every morning.    [provider]  naltrexone (DEPADE) 50 MG tablet Take 1 tablet by mouth once daily 02/19/19   [name], MD  tamsulosin (FLOMAX) 0.4 MG CAPS capsule Take 1 capsule by mouth once daily 02/19/19   [name], MD    Allergies  Patient has no known allergies.  Review of Systems   Review of Systems  Neurological: Negative for tremors.  Psychiatric/Behavioral: Negative for agitation.  All other systems reviewed and are negative.   Physical Exam Updated Vital Signs BP (!) 106/55   Pulse 76   Temp 98.5 F (36.9 C) (Oral)   Resp 16   SpO2 100%   Physical Exam Vitals and nursing note reviewed.  Constitutional:      Appearance: He is well-developed.  HENT:     Head: Normocephalic and atraumatic.     Mouth/Throat:     Mouth: Mucous membranes are moist.  Eyes:     Conjunctiva/sclera:  Conjunctivae normal.  Cardiovascular:     Rate and Rhythm: Regular rhythm. Tachycardia present.     Heart sounds: No murmur.  Pulmonary:     Effort: Pulmonary effort is normal. No respiratory distress.     Breath sounds: Normal breath sounds.  Abdominal:     Palpations: Abdomen is soft.     Tenderness: There is no abdominal tenderness.  Musculoskeletal:     Cervical back: Neck supple.  Skin:    General: Skin is warm and dry.  Neurological:     Mental Status: He is alert and oriented to person, place, and time.     Motor: Tremor present.     ED Results / Procedures / Treatments   Labs (all labs ordered are listed, but only abnormal results are displayed) Labs Reviewed  ETHANOL - Abnormal; Notable for the following components:      Result Value   Alcohol, Ethyl (B) 49 (*)    All other components within normal limits  COMPREHENSIVE METABOLIC PANEL - Abnormal; Notable for the following components:   Sodium 134 (*)    CO2 16 (*)    Glucose, Bld 117 (*)    Creatinine, Ser 1.38 (*)    Calcium 8.1 (*)    AST 58 (*)    Alkaline Phosphatase 31 (*)    GFR calc non Af Amer 52 (*)    All other components within normal limits  CBC WITH DIFFERENTIAL/PLATELET - Abnormal; Notable for the following components:   RBC 2.83 (*)    Hemoglobin 7.3 (*)    HCT 23.4 (*)    MCH 25.8 (*)    RDW 20.3 (*)    Platelets 144 (*)    All other components within normal limits  SARS CORONAVIRUS 2 (TAT 6-24 HRS)  RAPID URINE DRUG SCREEN, HOSP PERFORMED  RAPID URINE DRUG SCREEN, HOSP PERFORMED  CBC  COMPREHENSIVE METABOLIC PANEL  MAGNESIUM  PHOSPHORUS    EKG None  Radiology No results found.  Procedures Procedures (including critical care time)  Medications Ordered in ED Medications  folic acid (FOLVITE) tablet 1 mg (has no administration in time range)  multivitamin with minerals tablet 1 tablet (has no administration in time range)  thiamine tablet 100 mg (has no administration in time  range)  enoxaparin (LOVENOX) injection 40 mg (has no administration in time range)  loperamide (IMODIUM) capsule 2-4 mg (has no administration in time range)  ondansetron (ZOFRAN-ODT) disintegrating tablet 4 mg (has no administration in time range)  LORazepam (ATIVAN) tablet 1-4 mg (has no administration in time range)    Or  LORazepam (ATIVAN) injection 1-4 mg (has no administration in time range)  chlordiazePOXIDE (LIBRIUM) capsule 25 mg (has no administration in time range)    ED Course  I have reviewed the triage vital signs and the nursing notes.  Pertinent labs & imaging results that were available during my care of the patient were reviewed by me and considered in my medical decision making (see chart for details).    MDM Rules/Calculators/A&P                      Patient already has orders for admission to the internal medicine service. Screening labs obtained in the ED. Patient awaiting bed assignment. Final Clinical Impression(s) / ED Diagnoses Final diagnoses:  Alcohol withdrawal syndrome, with delirium [facility])    Rx / DC Orders ED Discharge Orders    None       Etta Quill, NP 04/23/19 2216    Daleen Bo, MD 04/26/19 [phone]

## 2019-04-23 NOTE — Addendum Note (Signed)
Addended by: Welford Roche on: 04/23/2019 03:00 PM   Modules accepted: Orders

## 2019-04-23 NOTE — Addendum Note (Signed)
Addended by: Modena Nunnery D on: 04/23/2019 03:20 PM   Modules accepted: Orders, SmartSet

## 2019-04-23 NOTE — Addendum Note (Signed)
Addended by: Welford Roche on: 04/23/2019 03:28 PM   Modules accepted: Orders

## 2019-04-23 NOTE — ED Provider Notes (Signed)
  Face-to-face evaluation   History: Patient presents from his doctor's office, on the way to being admitted to the hospital.  He states he had a nosebleed 3 days ago that improved with use of Afrin.  He states he drinks alcohol daily, last about noon today.  He states yesterday he only drank "1 beer."  Physical exam: He is alert, cooperative.  No dysarthria.  Mild tremor noted.  No asterixis.  Abdomen somewhat distended, firm but not tender to palpation.  No tachypnea.  No increased work of breathing.  He is tachycardic, 113.  Medical screening examination/treatment/procedure(s) were conducted as a shared visit with non-physician practitioner(s) and myself.  I personally evaluated the patient during the encounter    Daleen Bo, MD 04/26/19 [phone]

## 2019-04-23 NOTE — H&P (Deleted)
  The note originally documented on this encounter has been moved the the encounter in which it belongs.  

## 2019-04-23 NOTE — Progress Notes (Signed)
IV N?S at 100 ml/hr continees in left arm.  Patient transported to ER via w/c.Marland Kitchen  Sander Nephew, RN 04/23/2019 4:55 PM.

## 2019-04-23 NOTE — Progress Notes (Signed)
   CC: Nosebleeds and visual hallucination  HPI:  [name] is a 68 y.o. year-old male with PMH listed below who presents to clinic for nosebleeds and visual hallucinations. Please see problem based assessment and plan for further details.   Past Medical History:  Diagnosis Date  . Alcohol withdrawal delirium ([place]) 02/14/2019  . Hypertension   . Hypokalemia 07/19/2017  . Malnutrition of moderate degree 10/24/2018   Review of Systems:   Review of Systems  Constitutional: Positive for chills, fever and malaise/fatigue.  Respiratory: Negative for shortness of breath.   Cardiovascular: Negative for chest pain, palpitations and leg swelling.  Musculoskeletal: Negative for myalgias.  Psychiatric/Behavioral: Positive for hallucinations.    Physical Exam:  Vitals:   04/23/19 1310  BP: 122/64  Pulse: 74  Temp: 97.7 F (36.5 C)  TempSrc: Oral  SpO2: 100%  Weight: 125 lb 14.4 oz (57.1 kg)  Height: 5\' 4"  (1.626 m)    General: Initially well-appearing in no acute distress. By the end of visit patient became diaphoretic and was in mild distress  Nose: dry mucosal membranes, no blood or drainage noted  CV: RRR, no mrg  Neuro: Initially A&O x3, able to answer questions appropriately. No tremors. When reassessed patient was tremulous.    Assessment & Plan:   See Encounters Tab for problem based charting.  Patient discussed with Dr. Evette Doffing

## 2019-04-23 NOTE — Patient Instructions (Addendum)
Mr. Little,   For your nosebleed, make sure to keep noise moist with nasal saline. This will help prevent further bleeding.   For your visual hallucinations: A doctor will be calling you to discuss this further.   - Dr. Frederico Hamman

## 2019-04-23 NOTE — Addendum Note (Signed)
Addended by: Nicola Girt on: 04/23/2019 03:07 PM   Modules accepted: Miquel Dunn

## 2019-04-23 NOTE — ED Notes (Addendum)
ED TO INPATIENT HANDOFF REPORT  ED Nurse Name and Phone #: William Hamburger, RN [phone]  S Name/Age/Gender Dan Henry 68 y.o. male Room/Bed: 038C/038C  Code Status   Code Status: Full Code  Home/SNF/Other Home Patient oriented to: self, place, time and situation Is this baseline? No   Triage Complete: Triage complete  Chief Complaint Alcohol withdrawal [facility]) [F10.239]  Triage Note Pt to ED from Internal Medicine Clinic.  PT was supposed to be direct admit but was sent to ED because he needed COVID test prior to admission to floor.  Pt reports visual hallucinations x 1 week.  Per clinic pt was diaphoretic, dizzy, and having tremors.  Denies SI/HI.  Pt being admitted for ETOH withdrawal.  Librium given and IV started in Clinic.  Pt hypotensive on arrival to triage.    Allergies No Known Allergies  Level of Care/Admitting Diagnosis ED Disposition    ED Disposition Condition [place] [facility] Area: [place] [100100]  Level of Care: Progressive [102]  Admit to Progressive based on following criteria: ACUTE MENTAL DISORDER-RELATED Drug/Alcohol Ingestion/Overdose/Withdrawal, Suicidal Ideation/attempt requiring safety sitter and < Q2h monitoring/assessments, moderate to severe agitation that is managed with medication/sitter, CIWA-Ar score < 20.  Covid Evaluation: Asymptomatic Screening Protocol (No Symptoms)  Diagnosis: Alcohol withdrawal ([place]) [291.81.ICD-9-CM]  Admitting Physician: Velna Ochs [id number]  Attending Physician: Velna Ochs [id number]  Estimated length of stay: past midnight tomorrow  Certification:: I certify this patient will need inpatient services for at least 2 midnights       B Medical/Surgery History Past Medical History:  Diagnosis Date  . Alcohol withdrawal delirium ([place]) 02/14/2019  . Hypertension   . Hypokalemia 07/19/2017  . Malnutrition of moderate degree 10/24/2018   Past Surgical History:  Procedure  Laterality Date  . NO PAST SURGERIES       A IV Location/Drains/Wounds Patient Lines/Drains/Airways Status   Active Line/Drains/Airways    Name:   Placement date:   Placement time:   Site:   Days:   Peripheral IV 04/23/19 Anterior;Left Forearm   04/23/19    1445    Forearm   less than 1   Peripheral IV 04/23/19 Right Antecubital   04/23/19    1755    Antecubital   less than 1   Peripheral IV Left Arm   --    --    Arm             Intake/Output Last 24 hours No intake or output data in the 24 hours ending 04/23/19 1927  Labs/Imaging Results for orders placed or performed during the hospital encounter of 04/23/19 (from the past 48 hour(s))  Ethanol     Status: Abnormal   Collection Time: 04/23/19  5:31 PM  Result Value Ref Range   Alcohol, Ethyl (B) 49 (H) <10 mg/dL    Comment: (NOTE) Lowest detectable limit for serum alcohol is 10 mg/dL. For medical purposes only. Performed at [facility], [place] [address], [place], [place] [address]    No results found.  Pending Labs Unresulted Labs (From admission, onward)    Start     Ordered   04/24/19 0500  CBC  Tomorrow morning,   R     04/23/19 1917   04/24/19 0500  Comprehensive metabolic panel  Tomorrow morning,   R     04/23/19 1917   04/23/19 1918  Magnesium  Add-on,   AD     04/23/19 1917   04/23/19 1918  Phosphorus  Add-on,   AD     04/23/19 1917   04/23/19 1804  Comprehensive metabolic panel  ONCE - STAT,   STAT     04/23/19 1902   04/23/19 1804  Urine rapid drug screen (hosp performed)  ONCE - STAT,   STAT     04/23/19 1902   04/23/19 1804  CBC with Diff  ONCE - STAT,   STAT     04/23/19 1902   04/23/19 1755  SARS CORONAVIRUS 2 (TAT 6-24 HRS) Nasopharyngeal Nasopharyngeal Swab  (Tier 3 (TAT 6-24 hrs))  Once,   STAT    Question Answer Comment  Is this test for diagnosis or screening Screening   Symptomatic for COVID-19 as defined by CDC No   Hospitalized for COVID-19 No   Admitted to ICU for COVID-19 No    Previously tested for COVID-19 Yes   Resident in a congregate (group) care setting No   Employed in healthcare setting No      04/23/19 1801   04/23/19 1725  Rapid urine drug screen (hospital performed)  Once,   STAT     04/23/19 1725          Vitals/Pain Today's Vitals   04/23/19 1830 04/23/19 1845 04/23/19 1900 04/23/19 1915  BP: 115/60 110/62 (!) 109/52 (!) 106/55  Pulse:   79 76  Resp: 16 14 17 16   Temp:      TempSrc:      SpO2:   100% 100%  PainSc:        Isolation Precautions No active isolations  Medications Medications  folic acid (FOLVITE) tablet 1 mg (has no administration in time range)  multivitamin with minerals tablet 1 tablet (has no administration in time range)  thiamine tablet 100 mg (has no administration in time range)  enoxaparin (LOVENOX) injection 40 mg (has no administration in time range)  loperamide (IMODIUM) capsule 2-4 mg (has no administration in time range)  ondansetron (ZOFRAN-ODT) disintegrating tablet 4 mg (has no administration in time range)  LORazepam (ATIVAN) tablet 1-4 mg (has no administration in time range)    Or  LORazepam (ATIVAN) injection 1-4 mg (has no administration in time range)    Mobility walks High fall risk   Focused Assessments Neuro Assessment Handoff:  Swallow screen pass? No  Cardiac Rhythm: Normal sinus rhythm       Neuro Assessment: Exceptions to WDL Neuro Checks:      Last Documented NIHSS Modified Score:   Has TPA been given? No If patient is a Neuro Trauma and patient is going to OR before floor call report to [place] nurse: [phone] or [phone]     R Recommendations: See Admitting Provider Note  Report given to:   Additional Notes: CIWA: 3 (some tremors, reported visual hallucinations earlier that has resolved) last alcohol intake reported yesterday @ 5PM

## 2019-04-23 NOTE — Progress Notes (Signed)
Internal Medicine Clinic Attending  I saw and evaluated the patient.  I personally confirmed the key portions of the history and exam documented by Dr. Santos-Sanchez and I reviewed pertinent patient test results.  The assessment, diagnosis, and plan were formulated together and I agree with the documentation in the resident's note. 

## 2019-04-23 NOTE — H&P (Signed)
Date: 04/23/2019               Patient Name:  Dan Henry MRN: [id number]  DOB: Jan 22, 1951 Age / Sex: 68 y.o., male   PCP: Dan Rosenthal, MD         Medical Service: Internal Medicine Teaching Service         Attending Physician: Dr. Oda Kilts, MD    First Contact: Dr. Court Henry Pager: [phone]  Second Contact: Dr. Koleen Henry  Pager: [phone]       After Hours (After 5p/  First Contact Pager: [phone]  weekends / holidays): Second Contact Pager: [phone]   Chief Complaint: Visual hallucinations  History of Present Illness: Dan Henry 68 year old male with a past medical history of HTN, alcohol use disorder, and BPH who presents from [facility] internal medicine clinic for evaluation of visual hallucinations and found to be diaphoretic , dizzy, and tremulous. Pattient reports seeing hallucinations for about a week now. Says he sees faces. He is not currently seeing anyone, but has seen people in the room while in clinic and sometimes hear them speaking but cant make out what they say.Denies them telling him to harm anyone or himself. Reports he no longer drinks liquor, but drinks 2 cans of beer a day. Endorses previous admissions for withdrawal and is known to go into DT's per previous admissions. Denies any vision changes , urinary symptoms, cough , or sob.    Meds:  Current Facility-Administered Medications on File Prior to Encounter  Medication Dose Route Frequency Provider Last Rate Last Admin  . 0.9 %  sodium chloride infusion   Intravenous Continuous Dan Henry, Idalys, MD 100 mL/hr at 04/23/19 1445 100 mL/hr at 04/23/19 1445   Current Outpatient Medications on File Prior to Encounter  Medication Sig Dispense Refill  . amLODipine (NORVASC) 10 MG tablet Take 1 tablet (10 mg total) by mouth daily. 90 tablet 2  . atorvastatin (LIPITOR) 40 MG tablet Take 1 tablet (40 mg total) by mouth daily. (Patient taking differently: Take 40 mg by mouth at bedtime. ) 90 tablet  3  . feeding supplement, ENSURE ENLIVE, (ENSURE ENLIVE) LIQD Take 237 mLs by mouth 2 (two) times daily between meals. 237 mL 12  . lisinopril (ZESTRIL) 10 MG tablet Take 1 tablet (10 mg total) by mouth daily. 30 tablet 2  . metoprolol tartrate (LOPRESSOR) 50 MG tablet Take 1 tablet (50 mg total) by mouth 2 (two) times daily. 60 tablet 5  . Multiple Vitamin (MULTIVITAMIN WITH MINERALS) TABS tablet Take 1 tablet by mouth every morning.    . naltrexone (DEPADE) 50 MG tablet Take 1 tablet by mouth once daily 90 tablet 0  . tamsulosin (FLOMAX) 0.4 MG CAPS capsule Take 1 capsule by mouth once daily 90 capsule 0     Allergies: Allergies as of 04/23/2019  . (No Known Allergies)   Past Medical History:  Diagnosis Date  . Alcohol withdrawal delirium ([place]) 02/14/2019  . Hypertension   . Hypokalemia 07/19/2017  . Malnutrition of moderate degree 10/24/2018    Family History:  Family History  Problem Relation Age of Onset  . Hypertension Mother   . Hypertension Father   . Hypertension Sister   . Hypertension Sister   . Stroke Sister   . Cancer Brother        throat     Social History:  Social History   Tobacco Use  . Smoking status: Never Smoker  . Smokeless tobacco: Never  Used  Substance Use Topics  . Alcohol use: Yes    Alcohol/week: 4.0 standard drinks    Types: 4 Standard drinks or equivalent per week    Comment: occ  . Drug use: No     Review of Systems: A complete ROS was negative except as per HPI.   Physical Exam: There were no vitals taken for this visit.Vitals stable in Clinic .   Physical Exam Constitutional:      Appearance: Normal appearance. He is not ill-appearing or diaphoretic.  HENT:     Head: Normocephalic and atraumatic.     Mouth/Throat:     Mouth: Mucous membranes are dry.  Eyes:     General: No scleral icterus.    Conjunctiva/sclera: Conjunctivae normal.  Cardiovascular:     Rate and Rhythm: Normal rate and regular rhythm.     Heart sounds:  No murmur. No friction rub. No gallop.   Pulmonary:     Effort: Pulmonary effort is normal.     Breath sounds: No wheezing, rhonchi or rales.  Abdominal:     General: Bowel sounds are normal. There is no distension.     Tenderness: There is no abdominal tenderness.  Musculoskeletal:        General: No deformity or signs of injury.  Skin:    General: Skin is warm and dry.  Neurological:     Mental Status: He is alert and oriented to person, place, and time.  Psychiatric:        Mood and Affect: Mood normal.        Thought Content: Thought content normal.     Comments: Denies HI/SI. Denies seeing anyone in the room currently.      Assessment & Plan by Problem: Active Problems:   * No active hospital problems. *  [name] 68 year old male with a past medical history of HTN, alcohol use disorder, and BPH who is being admitted for alcohol withdrawal.   #Alcohol use disorder #Alcohol withdrawal  Patient presents with symptoms consistent with alcohol withdrawal.  Reports his last drink was this morning.Has a history of being sensitive and going into withdrawal quickly.  -Given Librium 25 mg in clinic, continue Librium 25 mg TID - CIWA w/ ativan - Started IV fluids in clinic   #HTN - Continued Metoprlol 50 mg BID and Amlodipine 10 mg daily - Holding Lisinopril 10 mg ,  Dispo: Admit patient to Inpatient with expected length of stay greater than 2 midnights.  Signed:  Tamsen Snider, MD PGY1  [phone]

## 2019-04-23 NOTE — ED Triage Notes (Addendum)
Pt to ED from Internal Medicine Clinic.  PT was supposed to be direct admit but was sent to ED because he needed COVID test prior to admission to floor.  Pt reports visual hallucinations x 1 week.  Per clinic pt was diaphoretic, dizzy, and having tremors.  Denies SI/HI.  Pt being admitted for ETOH withdrawal.  Librium given and IV started in Clinic.  Pt hypotensive on arrival to triage.

## 2019-04-23 NOTE — Assessment & Plan Note (Signed)
Patient reports having a nosebleed 2 days ago. He called EMS for help who applied pressure to the nose and use Afrin to stop the bleeding. He has not experienced further bleeding since then. Not on AC. Denies illicit substance use. On exam he does have dry nasal passages. Advised to use nasal saline to prevent irritation and keep sinuses moisturized.

## 2019-04-23 NOTE — Addendum Note (Signed)
Addended by: Truddie Crumble on: 04/23/2019 02:54 PM   Modules accepted: Orders

## 2019-04-23 NOTE — ED Notes (Signed)
Lab notified of blood sample sent earlier,

## 2019-04-24 ENCOUNTER — Inpatient Hospital Stay (HOSPITAL_COMMUNITY): Payer: Medicare HMO

## 2019-04-24 DIAGNOSIS — G250 Essential tremor: Secondary | ICD-10-CM

## 2019-04-24 DIAGNOSIS — Z7289 Other problems related to lifestyle: Secondary | ICD-10-CM

## 2019-04-24 DIAGNOSIS — D649 Anemia, unspecified: Secondary | ICD-10-CM

## 2019-04-24 DIAGNOSIS — I10 Essential (primary) hypertension: Secondary | ICD-10-CM

## 2019-04-24 DIAGNOSIS — K7010 Alcoholic hepatitis without ascites: Secondary | ICD-10-CM

## 2019-04-24 LAB — CBC
HCT: 23.8 % — ABNORMAL LOW (ref 39.0–52.0)
Hemoglobin: 7.5 g/dL — ABNORMAL LOW (ref 13.0–17.0)
MCH: 25.6 pg — ABNORMAL LOW (ref 26.0–34.0)
MCHC: 31.5 g/dL (ref 30.0–36.0)
MCV: 81.2 fL (ref 80.0–100.0)
Platelets: 153 10*3/uL (ref 150–400)
RBC: 2.93 MIL/uL — ABNORMAL LOW (ref 4.22–5.81)
RDW: 20.3 % — ABNORMAL HIGH (ref 11.5–15.5)
WBC: 5.4 10*3/uL (ref 4.0–10.5)
nRBC: 0 % (ref 0.0–0.2)

## 2019-04-24 LAB — FERRITIN: Ferritin: 26 ng/mL (ref 24–336)

## 2019-04-24 LAB — COMPREHENSIVE METABOLIC PANEL
ALT: 32 U/L (ref 0–44)
AST: 50 U/L — ABNORMAL HIGH (ref 15–41)
Albumin: 3.7 g/dL (ref 3.5–5.0)
Alkaline Phosphatase: 33 U/L — ABNORMAL LOW (ref 38–126)
Anion gap: 12 (ref 5–15)
BUN: 18 mg/dL (ref 8–23)
CO2: 20 mmol/L — ABNORMAL LOW (ref 22–32)
Calcium: 8.6 mg/dL — ABNORMAL LOW (ref 8.9–10.3)
Chloride: 105 mmol/L (ref 98–111)
Creatinine, Ser: 1.26 mg/dL — ABNORMAL HIGH (ref 0.61–1.24)
GFR calc Af Amer: >60 mL/min (ref >60)
GFR calc non Af Amer: 58 mL/min — ABNORMAL LOW (ref >60)
Glucose, Bld: 105 mg/dL — ABNORMAL HIGH (ref 70–99)
Potassium: 4.6 mmol/L (ref 3.5–5.1)
Sodium: 137 mmol/L (ref 135–145)
Total Bilirubin: 1.2 mg/dL (ref 0.3–1.2)
Total Protein: 6.5 g/dL (ref 6.5–8.1)

## 2019-04-24 LAB — MRSA PCR SCREENING: MRSA by PCR: NEGATIVE

## 2019-04-24 LAB — IRON AND TIBC
Iron: 14 ug/dL — ABNORMAL LOW (ref 45–182)
Saturation Ratios: 4 % — ABNORMAL LOW (ref 17.9–39.5)
TIBC: 378 ug/dL (ref 250–450)
UIBC: 364 ug/dL

## 2019-04-24 LAB — MAGNESIUM: Magnesium: 1.9 mg/dL (ref 1.7–2.4)

## 2019-04-24 LAB — PHOSPHORUS: Phosphorus: 4.7 mg/dL — ABNORMAL HIGH (ref 2.5–4.6)

## 2019-04-24 MED ORDER — CHLORDIAZEPOXIDE HCL 5 MG PO CAPS
25.0000 mg | ORAL_CAPSULE | Freq: Two times a day (BID) | ORAL | Status: DC
  Administered 2019-04-24: 25 mg via ORAL
  Filled 2019-04-24: qty 5

## 2019-04-24 MED ORDER — SODIUM CHLORIDE 0.9 % IV SOLN: INTRAVENOUS | Status: AC

## 2019-04-24 NOTE — Progress Notes (Signed)
Chaplain responded to a consult for an advanced directive.  The patient already had the paperwork and did not have any questions.  The chaplain will follow-up when needed.  Brion Aliment Chaplain Resident For questions concerning this note please contact me by pager [phone]

## 2019-04-24 NOTE — Progress Notes (Signed)
Internal Medicine Attending:  I have seen and evaluated Dan Henry and discussed their care with the Residency Team.   In brief, patient is a 68 yo M well known to me with a pmhx of HTN, alcohol use disorder complicated by alcoholic hepatitis and history of withdrawal requiring hospitalization for detox. He presented to our clinic yesterday with symptoms of diaphoresis, dizziness, and tremor. He endorsed hallucinations x 1 week. There was concern to alcohol withdrawal and he was admitted to our service and started on a librium taper.   PMHx, Fam Hx, and/or Soc Hx : Per resident note  Vitals:   04/24/19 0340 04/24/19 0856  BP: (!) 144/73 130/68  Pulse:  86  Resp: 16 15  Temp: 98.7 F (37.1 C) 98.4 F (36.9 C)  SpO2: 100% 100%   Physical Exam Constitutional: NAD, appears comfortable Cardiovascular: RRR, no murmurs, rubs, or gallops.  Pulmonary/Chest: CTAB, no wheezes, rales, or rhonchi.  Abdominal: Soft, non tender, non distended. +BS.  Extremities: Warm and well perfused. Distal pulses intact. No edema. Chronic tremor  Psychiatric: Normal mood and affect   Assessment and Plan: I have seen and evaluated the patient as outlined above. I agree with the formulated Assessment and Plan as detailed in the residents' note, with the following changes:   Alcohol Use Disorder Alcoholic Hepatitis  Possible Withdrawal syndrome  Doing well this morning, CIWA scores have remained essentially zero. He is scoring only for tremor, however he has prominent chronic essential tremor which complicates his clinical picture. He has received 2 doses of librium from what I can tell (one in clinic yesterday and one here in the hospital). He is not tachycardic and is not exhibiting any signs of withdrawal. Will decrease his librium taper to q12 hours today and likely discharge in the morning if he continues to do well.   Acute on Chronic Normocytic Anemia Patient has chronic anemia which was worsened over  the past few months. Hemoglobin today is 7.5 down from 10.5 two months ago. He was referred for a colonoscopy in October but did not get this done. He has not had any work up for his anemia from what I can tell. We will check iron studies while he is here. He reported an episode of epistaxis in clinic yesterday for which he called EMS two days ago. With his history of alcoholic hepatitis, I am concerned for worsening synthetic liver function vs development of varices with occult bleed. However he has no other signs of portal hypertension from what I can tell.  Plan to repeat liver imaging today with RUQ ultrasound to evaluate liver parenchyma (last MR abdomen 6 months ago showed hepatic steatosis).  Rest per resident note.   Velna Ochs, MD 12/23/20209:18 AM

## 2019-04-25 LAB — COMPREHENSIVE METABOLIC PANEL
ALT: 27 U/L (ref 0–44)
AST: 48 U/L — ABNORMAL HIGH (ref 15–41)
Albumin: 3.3 g/dL — ABNORMAL LOW (ref 3.5–5.0)
Alkaline Phosphatase: 28 U/L — ABNORMAL LOW (ref 38–126)
Anion gap: 8 (ref 5–15)
BUN: 12 mg/dL (ref 8–23)
CO2: 24 mmol/L (ref 22–32)
Calcium: 8.4 mg/dL — ABNORMAL LOW (ref 8.9–10.3)
Chloride: 107 mmol/L (ref 98–111)
Creatinine, Ser: 1.03 mg/dL (ref 0.61–1.24)
GFR calc Af Amer: >60 mL/min (ref >60)
GFR calc non Af Amer: >60 mL/min (ref >60)
Glucose, Bld: 93 mg/dL (ref 70–99)
Potassium: 4.3 mmol/L (ref 3.5–5.1)
Sodium: 139 mmol/L (ref 135–145)
Total Bilirubin: 0.8 mg/dL (ref 0.3–1.2)
Total Protein: 6 g/dL — ABNORMAL LOW (ref 6.5–8.1)

## 2019-04-25 LAB — PROTIME-INR
INR: 1.1 (ref 0.8–1.2)
Prothrombin Time: 14.4 seconds (ref 11.4–15.2)

## 2019-04-25 LAB — CBC
HCT: 23.5 % — ABNORMAL LOW (ref 39.0–52.0)
Hemoglobin: 7.3 g/dL — ABNORMAL LOW (ref 13.0–17.0)
MCH: 25.6 pg — ABNORMAL LOW (ref 26.0–34.0)
MCHC: 31.1 g/dL (ref 30.0–36.0)
MCV: 82.5 fL (ref 80.0–100.0)
Platelets: 167 10*3/uL (ref 150–400)
RBC: 2.85 MIL/uL — ABNORMAL LOW (ref 4.22–5.81)
RDW: 20.7 % — ABNORMAL HIGH (ref 11.5–15.5)
WBC: 4.7 10*3/uL (ref 4.0–10.5)
nRBC: 0 % (ref 0.0–0.2)

## 2019-04-25 MED ORDER — ACETAMINOPHEN 650 MG RE SUPP: 650.0000 mg | Freq: Four times a day (QID) | RECTAL | Status: DC | PRN

## 2019-04-25 MED ORDER — ACETAMINOPHEN 325 MG PO TABS
650.0000 mg | ORAL_TABLET | Freq: Four times a day (QID) | ORAL | Status: DC | PRN
  Administered 2019-04-25: 650 mg via ORAL

## 2019-04-25 MED ORDER — FERROUS SULFATE 325 (65 FE) MG PO TABS: 325.0000 mg | ORAL_TABLET | Freq: Every day | ORAL | 0 refills | Status: DC

## 2019-04-25 MED ORDER — FERROUS SULFATE 325 (65 FE) MG PO TABS
325.0000 mg | ORAL_TABLET | Freq: Every day | ORAL | Status: DC
  Administered 2019-04-25: 325 mg via ORAL
  Filled 2019-04-25: qty 1

## 2019-04-25 MED ORDER — THIAMINE HCL 100 MG PO TABS: 100.0000 mg | ORAL_TABLET | Freq: Every day | ORAL | 0 refills | Status: DC

## 2019-04-25 MED ORDER — CHLORDIAZEPOXIDE HCL 5 MG PO CAPS
25.0000 mg | ORAL_CAPSULE | Freq: Every day | ORAL | Status: DC
  Administered 2019-04-25: 25 mg via ORAL
  Filled 2019-04-25: qty 5

## 2019-04-25 MED ORDER — FOLIC ACID 1 MG PO TABS: 1.0000 mg | ORAL_TABLET | Freq: Every day | ORAL | 0 refills | Status: DC

## 2019-04-25 NOTE — Progress Notes (Signed)
Alcohol cessation resource placed on AVS for patient to follow up with.   Percell Locus Leeandra Ellerson LCSW [phone]

## 2019-04-25 NOTE — Progress Notes (Signed)
Niece called to get update on patient. Suggested creating a password to retrieve information

## 2019-04-25 NOTE — Discharge Summary (Signed)
Name: Dan Henry MRN: [id number] DOB: 1950-09-10 68 y.o. PCP: [name], MD  Date of Admission: 04/23/2019  5:19 PM Date of Discharge: 04/25/2019 Attending Physician: Velna Ochs, MD  Discharge Diagnosis: 1. Alcohol withdrawal 2. Alcohol use disorder 3. Normocytic anemia  Discharge Medications: Allergies as of 04/25/2019   No Known Allergies     Medication List    TAKE these medications   amLODipine 10 MG tablet Commonly known as: NORVASC Take 1 tablet (10 mg total) by mouth daily.   atorvastatin 40 MG tablet Commonly known as: LIPITOR Take 1 tablet (40 mg total) by mouth daily. What changed: when to take this   feeding supplement (ENSURE ENLIVE) Liqd Take 237 mLs by mouth 2 (two) times daily between meals.   ferrous sulfate 325 (65 FE) MG tablet Take 1 tablet (325 mg total) by mouth daily. Start taking on: December 25, [id number]   folic acid 1 MG tablet Commonly known as: FOLVITE Take 1 tablet (1 mg total) by mouth daily. Start taking on: April 26, 2019   lisinopril 10 MG tablet Commonly known as: ZESTRIL Take 1 tablet (10 mg total) by mouth daily.   multivitamin with minerals Tabs tablet Take 1 tablet by mouth every morning.   naltrexone 50 MG tablet Commonly known as: DEPADE Take 1 tablet by mouth once daily   tamsulosin 0.4 MG Caps capsule Commonly known as: FLOMAX Take 1 capsule by mouth once daily   thiamine 100 MG tablet Take 1 tablet (100 mg total) by mouth daily. Start taking on: April 26, 2019       Disposition and follow-up:   [name] was discharged from [facility] in Stable condition.  At the hospital follow up visit please address:  1.  Please assess patient for continued visual hallucinations, withdrawal symptoms. Please refer patient to ophthalmology as visual hallucinations may be secondary to primary eye abnormality.  Please also refer patient for colonoscopy due to anemia.  2.  Labs /  imaging needed at time of follow-up: CBC to evaluate anemia, CMP to evaluate renal and liver function.  3.  Pending labs/ test needing follow-up: None  Follow-up Appointments: Follow-up Information    [name], MD Follow up.   Specialty: Internal Medicine Why: Please call for a followup appointment within the next week Contact information: [address] [address] [place] Hospital Course by problem list:  # Alcohol use disorder: # Alcohol withdrawal:  On presentation, patient reported visual hallucinations for about 1 week, had subjective fever, chills, and sweating.  Patient had history of significant withdrawal symptoms and reported his last drink was morning of 12/22.  Patient was started on Librium 5 mg 3 times daily which on the following day was spaced out to 5 mg 2 times daily.  On day of discharge, patient was without significant withdrawal symptoms and was without visual hallucinations.  # Visual hallucinations: Visual hallucinations may be secondary to alcohol withdrawal.  However, given unclear temporal association with alcohol usage, visual hallucinations may be secondary to primary eye abnormality.  Patient would likely benefit from outpatient referral to ophthalmology.  Visual hallucinations were not associated with increased or decreased light.  # Acute on chronic normocytic anemia: Hemoglobin in range of 7 to 8 during hospitalization.  Patient was without signs of active bleeding.  Iron studies revealed low iron (14) and low-normal ferritin (26) consistent with iron  deficiency anemia.  INR (1.1) and platelets (167) were within normal limits.  Patient's right upper quadrant ultrasound showed no evidence of cirrhosis, demonstrated fatty infiltration which may be secondary to alcoholic liver disease.  Patient would likely benefit from outpatient colonoscopy to investigate source of blood loss.  Patient was discharged on iron  supplementation.  # Elevated creatinine: Creatinine of 1.32 on presentation.  With IV fluids and p.o. intake, creatinine was within normal limits on day of discharge.  Discharge Vitals:   BP 139/74 (BP Location: Right Arm)   Pulse 92   Temp 98.4 F (36.9 C) (Oral)   Resp 15   Ht 5\' 5"  (1.651 m)   Wt 55.4 kg   SpO2 99%   BMI 20.32 kg/m   Pertinent Labs, Studies, and Procedures:  CBC Latest Ref Rng & Units 04/25/2019 04/24/2019 04/23/2019  WBC 4.0 - 10.5 K/uL 4.7 5.4 5.2  Hemoglobin 13.0 - 17.0 g/dL 7.3(L) 7.5(L) 7.3(L)  Hematocrit 39.0 - 52.0 % 23.5(L) 23.8(L) 23.4(L)  Platelets 150 - 400 K/uL 167 153 144(L)   BMP Latest Ref Rng & Units 04/25/2019 04/24/2019 04/23/2019  Glucose 70 - 99 mg/dL 93 105(H) 117(H)  BUN 8 - 23 mg/dL 12 18 15   Creatinine 0.61 - 1.24 mg/dL 1.03 1.26(H) 1.38(H)  BUN/Creat Ratio 10 - 24 - - -  Sodium 135 - 145 mmol/L 139 137 134(L)  Potassium 3.5 - 5.1 mmol/L 4.3 4.6 4.5  Chloride 98 - 111 mmol/L 107 105 105  CO2 22 - 32 mmol/L 24 20(L) 16(L)  Calcium 8.9 - 10.3 mg/dL 8.4(L) 8.6(L) 8.1(L)    RUQ [place] (04/24/2019): IMPRESSION: 1.  No gallstones or biliary distention.  2. Increase hepatic echogenicity consistent fatty infiltration. 8 mm simple cyst left hepatic lobe. Previously identified 1.2 cm subcapsular hypoechoic focus in the right hepatic lobe not visualized on today's exam.   Discharge Instructions: Discharge Instructions    Diet - low sodium heart healthy   Complete by: As directed    Discharge instructions   Complete by: As directed    You were seen in the hospital for alcohol withdrawal symptoms.  To prevent future withdrawal symptoms, it is important for you to stop drinking alcohol.  The hallucinations you were having may be from issues with your eyes and not from alcohol withdrawal. It is important for you to followup with your primary care doctor where they can refer you to be seen by an ophthalmologist.   You were also found to  have low blood counts.  You may be losing blood in your colon and will need a colonoscopy to find out what is causing your bleeding.  Please follow-up with your primary care doctor can refer you for a colonoscopy.  We have also started you on iron supplements (ferrous sulfate) which can help your body to make more blood.    Thank you for allowing [place] to be part of your medical care!   Increase activity slowly   Complete by: As directed       Signed: Jeanmarie Hubert, MD 04/25/2019, 12:40 PM   Pager: [phone]

## 2019-04-25 NOTE — Progress Notes (Addendum)
  Subjective:  Patient seen at bedside this morning.  Patient states that he feels well.  Denies tremor, sweating, visual hallucinations, difficulty breathing. Endorses mild headache. Patient states that he feels ready for discharge.  Patient counseled on need for alcohol cessation.  Patient also counseled on his anemia, need for outpatient follow-up and a colonoscopy.  Objective:    Vital Signs (last 24 hours): Vitals:   04/24/19 1520 04/24/19 1955 04/24/19 2350 04/25/19 0340  BP: (!) 148/67 137/67 132/65 124/77  Pulse: 100 91 80 82  Resp: (!) 21 14 14 15   Temp: 98.5 F (36.9 C) 98.4 F (36.9 C) 98.8 F (37.1 C) 98.5 F (36.9 C)  TempSrc: Oral Oral Oral Oral  SpO2: 98% 100% 100% 99%  Weight:    55.4 kg  Height:        Physical Exam: General Alert and answers questions appropriately, no acute distress  Cardiac Regular rate and rhythm, no murmurs, rubs, or gallops  Pulmonary Clear to auscultation bilaterally without wheezes, rhonchi, or rales   CBC Latest Ref Rng & Units 04/25/2019 04/24/2019 04/23/2019  WBC 4.0 - 10.5 K/uL 4.7 5.4 5.2  Hemoglobin 13.0 - 17.0 g/dL 7.3(L) 7.5(L) 7.3(L)  Hematocrit 39.0 - 52.0 % 23.5(L) 23.8(L) 23.4(L)  Platelets 150 - 400 K/uL 167 153 144(L)   BMP Latest Ref Rng & Units 04/25/2019 04/24/2019 04/23/2019  Glucose 70 - 99 mg/dL 93 105(H) 117(H)  BUN 8 - 23 mg/dL 12 18 15   Creatinine 0.61 - 1.24 mg/dL 1.03 1.26(H) 1.38(H)  BUN/Creat Ratio 10 - 24 - - -  Sodium 135 - 145 mmol/L 139 137 134(L)  Potassium 3.5 - 5.1 mmol/L 4.3 4.6 4.5  Chloride 98 - 111 mmol/L 107 105 105  CO2 22 - 32 mmol/L 24 20(L) 16(L)  Calcium 8.9 - 10.3 mg/dL 8.4(L) 8.6(L) 8.1(L)    Assessment/Plan:   Active Problems:   Alcohol withdrawal (HCC)   Alcoholic hepatitis   Patient is a 68 year old male with past medical history of hypertension, alcohol use disorder, and BPH who was admitted on 12/22 for alcohol withdrawal.  # Alcohol use disorder: # Alcohol withdrawal:  On presentation, patient reported visual hallucinations for about 1 week, had subjective fever, chills, sweating.  Patient had history of significant withdrawal symptoms and reported his last drink was morning of 12/22.  * Patient was started on Librium 5 mg 3 times daily on admission, which was spaced out to twice daily yesterday. * Patient currently without significant withdrawal symptoms  # Acute on chronic normocytic anemia: Hemoglobin stable at 7.3, no signs of active bleeding. Iron studies have low iron( 14) and low-normal ferritin (26) consistent with iron deficiency anemia. INR (1.1) and platelets (167) are WNL.  Patient's RUQ ultrasound shows no evidence of cirrhosis, demonstrates fatty infiltration which may be secondary to alcoholic liver disease. *Patient counseled on need for outpatient follow-up, colonoscopy. *Start iron supplementation  # Elevated creatinine: Creatinine of 1.32 on presentation.  With IV fluids and p.o. intake, creatinine is now within normal limits.  Diet: Heart healthy DVT Ppx: Lovenox 40 mg daily Dispo: Anticipated discharge today  Jeanmarie Hubert, MD 04/25/2019, 6:11 AM Pager: [phone]

## 2019-05-01 ENCOUNTER — Telehealth: Payer: Self-pay | Admitting: Licensed Clinical Social Worker

## 2019-05-01 ENCOUNTER — Encounter: Payer: Self-pay | Admitting: Licensed Clinical Social Worker

## 2019-05-01 NOTE — Telephone Encounter (Signed)
Patient was called, no answer and no vm available. A letter will also be mailed today.

## 2019-05-08 ENCOUNTER — Ambulatory Visit (INDEPENDENT_AMBULATORY_CARE_PROVIDER_SITE_OTHER): Payer: Medicare HMO | Admitting: Internal Medicine

## 2019-05-08 ENCOUNTER — Encounter: Payer: Self-pay | Admitting: Internal Medicine

## 2019-05-08 ENCOUNTER — Ambulatory Visit (HOSPITAL_COMMUNITY)
Admission: RE | Admit: 2019-05-08 | Discharge: 2019-05-08 | Disposition: A | Discharge status: Home / Self Care | Payer: Medicare HMO | Source: Ambulatory Visit | Attending: Internal Medicine | Admitting: Internal Medicine

## 2019-05-08 VITALS — BP 185/99 | HR 109 | Temp 98.0°F | Ht 64.0 in | Wt 128.8 lb

## 2019-05-08 DIAGNOSIS — R000 Tachycardia, unspecified: Secondary | ICD-10-CM | POA: Diagnosis not present

## 2019-05-08 DIAGNOSIS — I10 Essential (primary) hypertension: Secondary | ICD-10-CM

## 2019-05-08 DIAGNOSIS — F1021 Alcohol dependence, in remission: Secondary | ICD-10-CM

## 2019-05-08 DIAGNOSIS — Z79899 Other long term (current) drug therapy: Secondary | ICD-10-CM

## 2019-05-08 DIAGNOSIS — R9431 Abnormal electrocardiogram [ECG] [EKG]: Secondary | ICD-10-CM | POA: Diagnosis not present

## 2019-05-08 DIAGNOSIS — I209 Angina pectoris, unspecified: Secondary | ICD-10-CM | POA: Diagnosis not present

## 2019-05-08 DIAGNOSIS — D649 Anemia, unspecified: Secondary | ICD-10-CM | POA: Diagnosis not present

## 2019-05-08 DIAGNOSIS — I208 Other forms of angina pectoris: Secondary | ICD-10-CM | POA: Insufficient documentation

## 2019-05-08 DIAGNOSIS — Z0000 Encounter for general adult medical examination without abnormal findings: Secondary | ICD-10-CM

## 2019-05-08 DIAGNOSIS — Z7289 Other problems related to lifestyle: Secondary | ICD-10-CM

## 2019-05-08 MED ORDER — NITROGLYCERIN 0.4 MG SL SUBL: 0.4000 mg | SUBLINGUAL_TABLET | SUBLINGUAL | 3 refills | Status: DC | PRN

## 2019-05-08 NOTE — Progress Notes (Signed)
   CC: Follow-up hypertension  HPI:  [name] is a 69 y.o. gentleman with medical history listed below presenting to follow-up on chronic medical problems.  Please see problem based charting for further details.   Past Medical History:  Diagnosis Date  . Alcohol withdrawal delirium ([place]) 02/14/2019  . Hypertension   . Hypokalemia 07/19/2017  . Malnutrition of moderate degree 10/24/2018   Review of Systems: As per HPI  Physical Exam:  Vitals:   05/08/19 1354  BP: (!) 185/99  Pulse: (!) 109  Temp: 98 F (36.7 C)  TempSrc: Oral  SpO2: 99%  Weight: 128 lb 12.8 oz (58.4 kg)  Height: 5\' 4"  (1.626 m)   Physical Exam  HENT:  Head: Normocephalic and atraumatic.  Cardiovascular: Normal heart sounds. Tachycardia present. Exam reveals no friction rub.  No murmur heard. Pulmonary/Chest: Effort normal and breath sounds normal. No respiratory distress. He has no wheezes. He has no rales.    Assessment & Plan:   See Encounters Tab for problem based charting.  Patient discussed with Dr. Evette Doffing

## 2019-05-08 NOTE — Patient Instructions (Signed)
[name],   It was a pleasure taking care of you here in the clinic today.  Here are my recommendations after our visit today.  1.  For your blood pressure, I would like for you to please continue taking your medicines.  I would like for you to see me in the clinic next week and bring all your blood pressure medicines.  2.  For your chest pain, I am starting you on a new medication called nitroglycerin.  Whenever you experience the chest pain, please put 1 pill under your tongue.  I am also referring you to a cardiologist.  3.  For the anemia, I schedule you for a colonoscopy.  I am also checking blood count today.  Take care!  Dr. Eileen Stanford  Please call the internal medicine center clinic if you have any questions or concerns, we may be able to help and keep you from a long and expensive emergency room wait. Our clinic and after hours phone number is [phone], the best time to call is Monday through Friday 9 am to 4 pm but there is always someone available 24/7 if you have an emergency. If you need medication refills please notify your pharmacy one week in advance and they will send [place] a request.

## 2019-05-08 NOTE — Assessment & Plan Note (Signed)
Alcohol use disorder: He tells me that he has abstained from hard liquor and his last drink was last week when he drank 112 ounce beer.  His visual hallucinations is also improved  Plan: -Advised on alcohol cessation -Continue naltrexone

## 2019-05-08 NOTE — Assessment & Plan Note (Addendum)
Stable angina: He tells me that about 3 weeks ago he began experiencing chest pain with exertion that occurs after 20 minutes of work.  The pain resolves at rest.  Is located centrally and left-sided with associated fatigue and lightheadedness.  He denies syncope, shortness of breath, radiation of pain, diaphoresis, nausea.  He does have several risk factors for an ischemic heart disease. STAT EKG without acute ST changes, T-waves seemed somewhat elevated but unchanged from old EKG  Plan: -Prescription for sublingual nitrogen given as needed -Refer to cardiology for ischemic evaluation

## 2019-05-08 NOTE — Assessment & Plan Note (Addendum)
Normocytic anemia: During his recent hospitalization, he was found to have normocytic anemia with hemoglobin 7.3, MCV 82.5.  Iron studies reviewed low iron of 14, TIBC 378, saturation of 4 and ferritin of 26.    A: Mixed pattern normocytic anemia likely secondary to both iron deficiency anemia and bone marrow suppression from EtOH use.  Plan -Refer to GI for colonoscopy -CBC today -Continue iron supplementation

## 2019-05-08 NOTE — Assessment & Plan Note (Signed)
Hypertension: Uncontrolled and not at goal  BP Readings from Last 3 Encounters:  05/08/19 (!) 185/99  04/25/19 139/74  04/23/19 99/60   A: I am suspecting both medication noncompliance and dietary indiscretion.  He has been admitted to the hospital several times in the past year (Alcohol withdrawal and Hypertensive urgency)  and each time his blood pressure is very well under controlled during hospitalization however he is ambulatory blood pressures are uncontrolled.  I advised him to continue taking his medications.  Plan: -Advised to continue antihypertensives daily -Continue amlodipine 10 mg daily, metoprolol 50 mg twice daily, lisinopril 10 mg daily -Return to clinic in 1 week for hypertension follow-up

## 2019-05-08 NOTE — Assessment & Plan Note (Signed)
Health maintenance: -Declined pneumonia and flu vaccine.

## 2019-05-09 LAB — CBC
Hematocrit: 29.2 % — ABNORMAL LOW (ref 37.5–51.0)
Hemoglobin: 8.9 g/dL — ABNORMAL LOW (ref 13.0–17.7)
MCH: 24.3 pg — ABNORMAL LOW (ref 26.6–33.0)
MCHC: 30.5 g/dL — ABNORMAL LOW (ref 31.5–35.7)
MCV: 80 fL (ref 79–97)
Platelets: 371 10*3/uL (ref 150–450)
RBC: 3.66 x10E6/uL — ABNORMAL LOW (ref 4.14–5.80)
RDW: 18.3 % — ABNORMAL HIGH (ref 11.6–15.4)
WBC: 5.9 10*3/uL (ref 3.4–10.8)

## 2019-05-09 LAB — BMP8+ANION GAP
Anion Gap: 18 mmol/L (ref 10.0–18.0)
BUN/Creatinine Ratio: 10 (ref 10–24)
BUN: 11 mg/dL (ref 8–27)
CO2: 22 mmol/L (ref 20–29)
Calcium: 9.4 mg/dL (ref 8.6–10.2)
Chloride: 104 mmol/L (ref 96–106)
Creatinine, Ser: 1.05 mg/dL (ref 0.76–1.27)
GFR calc Af Amer: 84 mL/min/{1.73_m2} (ref >59)
GFR calc non Af Amer: 73 mL/min/{1.73_m2} (ref >59)
Glucose: 89 mg/dL (ref 65–99)
Potassium: 4.2 mmol/L (ref 3.5–5.2)
Sodium: 144 mmol/L (ref 134–144)

## 2019-05-09 NOTE — Progress Notes (Signed)
Internal Medicine Clinic Attending  Case discussed with Dr. Agyei at the time of the visit.  We reviewed the resident's history and exam and pertinent patient test results.  I agree with the assessment, diagnosis, and plan of care documented in the resident's note.    

## 2019-05-13 ENCOUNTER — Encounter: Payer: Self-pay | Admitting: Internal Medicine

## 2019-05-15 ENCOUNTER — Ambulatory Visit (INDEPENDENT_AMBULATORY_CARE_PROVIDER_SITE_OTHER): Payer: Medicare HMO | Admitting: Cardiology

## 2019-05-15 ENCOUNTER — Encounter: Payer: Self-pay | Admitting: Cardiology

## 2019-05-15 ENCOUNTER — Other Ambulatory Visit: Payer: Self-pay

## 2019-05-15 ENCOUNTER — Encounter: Payer: Medicare HMO | Admitting: Internal Medicine

## 2019-05-15 ENCOUNTER — Encounter: Payer: Self-pay | Admitting: Internal Medicine

## 2019-05-15 VITALS — BP 150/70 | HR 116 | Ht 64.0 in | Wt 127.0 lb

## 2019-05-15 DIAGNOSIS — I10 Essential (primary) hypertension: Secondary | ICD-10-CM | POA: Diagnosis not present

## 2019-05-15 DIAGNOSIS — Z01812 Encounter for preprocedural laboratory examination: Secondary | ICD-10-CM | POA: Diagnosis not present

## 2019-05-15 DIAGNOSIS — I209 Angina pectoris, unspecified: Secondary | ICD-10-CM

## 2019-05-15 DIAGNOSIS — I2510 Atherosclerotic heart disease of native coronary artery without angina pectoris: Secondary | ICD-10-CM

## 2019-05-15 LAB — BASIC METABOLIC PANEL
BUN/Creatinine Ratio: 13 (ref 10–24)
BUN: 13 mg/dL (ref 8–27)
CO2: 18 mmol/L — ABNORMAL LOW (ref 20–29)
Calcium: 8.7 mg/dL (ref 8.6–10.2)
Chloride: 103 mmol/L (ref 96–106)
Creatinine, Ser: 1.04 mg/dL (ref 0.76–1.27)
GFR calc Af Amer: 85 mL/min/{1.73_m2} (ref >59)
GFR calc non Af Amer: 73 mL/min/{1.73_m2} (ref >59)
Glucose: 92 mg/dL (ref 65–99)
Potassium: 4.5 mmol/L (ref 3.5–5.2)
Sodium: 139 mmol/L (ref 134–144)

## 2019-05-15 LAB — CBC
Hematocrit: 28.9 % — ABNORMAL LOW (ref 37.5–51.0)
Hemoglobin: 8.4 g/dL — ABNORMAL LOW (ref 13.0–17.7)
MCH: 23 pg — ABNORMAL LOW (ref 26.6–33.0)
MCHC: 29.1 g/dL — ABNORMAL LOW (ref 31.5–35.7)
MCV: 79 fL (ref 79–97)
Platelets: 179 10*3/uL (ref 150–450)
RBC: 3.65 x10E6/uL — ABNORMAL LOW (ref 4.14–5.80)
RDW: 18.4 % — ABNORMAL HIGH (ref 11.6–15.4)
WBC: 5.1 10*3/uL (ref 3.4–10.8)

## 2019-05-15 NOTE — Patient Instructions (Addendum)
Medication Instructions:  The current medical regimen is effective;  continue present plan and medications.  *If you need a refill on your cardiac medications before your next appointment, please call your pharmacy*  Lab Work: Please have blood work today (BMP, CBC)  If you have labs (blood work) drawn today and your tests are completely normal, you will receive your results only by: Marland Kitchen MyChart Message (if you have MyChart) OR . A paper copy in the mail If you have any lab test that is abnormal or we need to change your treatment, we will call you to review the results.  Testing/Procedures: Your physician has requested that you have a cardiac catheterization. Cardiac catheterization is used to diagnose and/or treat various heart conditions. Doctors may recommend this procedure for a number of different reasons. The most common reason is to evaluate chest pain. Chest pain can be a symptom of coronary artery disease (CAD), and cardiac catheterization can show whether plaque is narrowing or blocking your heart's arteries. This procedure is also used to evaluate the valves, as well as measure the blood flow and oxygen levels in different parts of your heart. For further information please visit HugeFiesta.[email or web address]. Please follow instruction sheet, as given.  Follow-Up: Follow up after your cardiac cath with Dr Marlou Porch or one of his team members.  Thank you for choosing [facility]!!       [facility] OFFICE [place], [place] [address] Dept: [phone] Loc: [place]  05/15/2019  You are scheduled for a Cardiac Catheterization on  Monday May 20, 2019 with Dr. Saunders Revel .  1. Please arrive at the [facility] (Main Entrance A) at [facility]: [place], [address] [address] at  8:30 AM  (two hours before your procedure to ensure your preparation).  Free valet parking service is available.   Special note: Every effort is made to have your procedure done on time. Please understand that emergencies sometimes delay scheduled procedures.  2. Diet: Do not eat or drink anything after midnight prior to your procedure except sips of water to take medications.  3. Labs: Please have blood work today (CBC, BMP)   You will need a Covid screen complete before the cardiac cath.  This will be done at [facility].  Please stay in your car and in the right hand lane.  Follow the "pre-procedure" signs.  Appt for Friday 05/17/2019 at 1:25 PM.  4. Medication instructions in preparation for your procedure:  On the morning of your procedure, take Aspirin 81 mg and any other normal morning medicines.  You may use sips of water.  5. Plan for one night stay--bring personal belongings. 6. Bring a current list of your medications and current insurance cards. 7. You MUST have a responsible person to drive you home. 8. Someone MUST be with you the first 24 hours after you arrive home or your discharge will be delayed. 9. Please wear clothes that are easy to get on and off and wear slip-on shoes.  Thank you for allowing [place] to care for you!   -- [place] [facility]

## 2019-05-15 NOTE — Progress Notes (Addendum)
Cardiology Office Note:    Date:  05/17/2019   ID:  Dan Henry, DOB [birth year], MRN [id number]  PCP:  [name], MD  Cardiologist:  Candee Furbish, MD  Electrophysiologist:  None   Referring MD: Sid Falcon, MD     History of Present Illness:    Dan Henry is a 69 y.o. male here for the evaluation of angina at the request of Dr. Daryll Henry.  Hospitalization was reviewed from December 2020, had alcohol withdrawal hallucinations for 1 week Librium utilized.  Had visual hallucinations thought to be possibly secondary to eye abnormality as well.  He also had normocytic anemia with hemoglobin 7-8 during hospitalization.  Ferritin was 26.  He underwent a right upper quadrant ultrasound that showed no cirrhosis but did demonstrate fatty infiltration which may be alcoholic liver disease.  Plan was to proceed with colonoscopy.  They are still struggling with control of his hypertension.  He has been admitted several times to the hospital with alcohol withdrawal and hypertensive urgency.  Blood pressure is very well controlled under hospitalization but then in the ambulatory setting can be uncontrolled.  He also on 05/08/2019 told his primary care doctor that he was feeling anginal symptoms, chest pain exertional after 20 minutes of work.  Resolved with rest.  Left-sided associated with fatigue and lightheadedness.  No diaphoresis syncope chest pain.  EKG was performed in the office setting that showed no acute ST changes.  T waves were slightly abnormal but unchanged from previous EKG.  He was referred to [place] for further evaluation.  Sublingual nitroglycerin prescription was given.  He still continues to have this chest discomfort with exertion.  He states that the nitroglycerin has helped him.  He also states that he has cut back on his drinking.  No longer drinking the hard stuff he states.   Past Medical History:  Diagnosis Date  . Alcohol withdrawal delirium ([place]) 02/14/2019  . Hypertension    . Hypokalemia 07/19/2017  . Malnutrition of moderate degree 10/24/2018    Past Surgical History:  Procedure Laterality Date  . NO PAST SURGERIES      Current Medications: Current Meds  Medication Sig  . amLODipine (NORVASC) 10 MG tablet Take 1 tablet (10 mg total) by mouth daily.  Marland Kitchen atorvastatin (LIPITOR) 40 MG tablet Take 1 tablet (40 mg total) by mouth daily.  . feeding supplement, ENSURE ENLIVE, (ENSURE ENLIVE) LIQD Take 237 mLs by mouth 2 (two) times daily between meals.  Marland Kitchen lisinopril (ZESTRIL) 10 MG tablet Take 1 tablet (10 mg total) by mouth daily.  . Multiple Vitamin (MULTIVITAMIN WITH MINERALS) TABS tablet Take 1 tablet by mouth every morning.  . naltrexone (DEPADE) 50 MG tablet Take 1 tablet by mouth once daily  . nitroGLYCERIN (NITROSTAT) 0.4 MG SL tablet Place 1 tablet (0.4 mg total) under the tongue every 5 (five) minutes as needed for chest pain.  . tamsulosin (FLOMAX) 0.4 MG CAPS capsule Take 1 capsule by mouth once daily  . thiamine 100 MG tablet Take 1 tablet (100 mg total) by mouth daily.  . [DISCONTINUED] ferrous sulfate 325 (65 FE) MG tablet Take 1 tablet (325 mg total) by mouth daily.  . [DISCONTINUED] folic acid (FOLVITE) 1 MG tablet Take 1 tablet (1 mg total) by mouth daily.     Allergies:   Patient has no known allergies.   Social History   Socioeconomic History  . Marital status: Single    Spouse name: Not on file  . Number  of children: 0  . Years of education: 83  . Highest education level: Not on file  Occupational History  . Occupation: Retired  Tobacco Use  . Smoking status: Never Smoker  . Smokeless tobacco: Never Used  Substance and Sexual Activity  . Alcohol use: Yes    Alcohol/week: 4.0 standard drinks    Types: 4 Standard drinks or equivalent per week    Comment: occ  . Drug use: No  . Sexual activity: Not on file  Other Topics Concern  . 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 Difficulty of Paying Living Expenses: Not on file  Food Insecurity:   . Worried About Charity fundraiser in the Last Year: Not on file  . Ran Out of Food in the Last Year: Not on file  Transportation Needs:   . Lack of Transportation (Medical): Not on file  . Lack of Transportation (Non-Medical): Not on file  Physical Activity:   . Days of Exercise per Week: Not on file  . Minutes of Exercise per Session: Not on file  Stress:   . Feeling of Stress : Not on file  Social Connections:   . Frequency of Communication with Friends and Family: Not on file  . Frequency of Social Gatherings with Friends and Family: Not on file  . Attends Religious Services: Not on file  . Active Member of Clubs or Organizations: Not on file  . Attends Archivist Meetings: Not on file  . Marital Status: Not on file     Family History: The patient's family history includes Cancer in his brother; Hypertension in his father, mother, sister, and sister; Stroke in his sister.  ROS:   Please see the history of present illness.    No fevers chills nausea vomiting syncope no melena no bleeding all other systems reviewed and are negative.  EKGs/Labs/Other Studies Reviewed:    The following studies were reviewed today: EKGs reviewed  EKG:  EKG is  ordered today.  The ekg ordered today demonstrates 05/15/2019-sinus rhythm, subtle peaking of T waves in V3, heart rate 116 beats per minute.  LVH, no change from prior prior EKG from 05/08/2019 shows sinus rhythm LVH no other significant abnormalities.  Recent Labs: 06/26/2018: TSH 0.468 04/24/2019: Magnesium 1.9 04/25/2019: ALT 27 05/15/2019: BUN 13; Creatinine, Ser 1.04; Hemoglobin 8.4; Platelets 179; Potassium 4.5; Sodium 139  Recent Lipid Panel    Component Value Date/Time   CHOL 173 05/08/2017 1030   TRIG 50 05/08/2017  1030   HDL 104 05/08/2017 1030   CHOLHDL 1.7 05/08/2017 1030   LDLCALC 59 05/08/2017 1030    Physical Exam:    VS:  BP (!) 150/70   Pulse (!) 116   Ht 5\' 4"  (1.626 m)   Wt 127 lb (57.6 kg)   SpO2 95%   BMI 21.80 kg/m     Wt Readings from Last 3 Encounters:  05/15/19 127 lb (57.6 kg)  05/08/19 128 lb 12.8 oz (58.4 kg)  04/25/19 122 lb 2.2 oz (55.4 kg)     GEN: Thin in no acute distress HEENT: Normal NECK: No JVD; No carotid bruits LYMPHATICS: No lymphadenopathy CARDIAC: RRR, no murmurs, rubs, gallops RESPIRATORY:  Clear to auscultation without rales, wheezing or rhonchi  ABDOMEN: Soft, non-tender,  non-distended MUSCULOSKELETAL:  No edema; No deformity  SKIN: Warm and dry NEUROLOGIC:  Alert and oriented x 3 PSYCHIATRIC:  Normal affect   ASSESSMENT:    1. Angina pectoris ([place])   2. ASCVD (arteriosclerotic cardiovascular disease)   3. Essential hypertension   4. Pre-procedure lab exam    PLAN:    In order of problems listed above:  Angina -EKGs have been unremarkable.  Certainly differential diagnosis includes gastritis from alcohol use especially given his iron deficiency anemia.  Heart rate is too elevated to perform coronary CT at this time.  We could try to block him however this may be challenging.  Given these recurrent symptoms, with exertional component, age, risk factors, we will go ahead and proceed with cardiac catheterization, left heart catheterization with possible percutaneous intervention.  Risks and benefits of been explained to the patient including stroke heart attack death renal impairment bleeding.  I will ask him to take an aspirin on the morning of the test.  Otherwise, I will refrain from giving him daily aspirin given his anemia.  He has nitroglycerin.  He states that the nitroglycerin has been helping him when he is exerting himself and feeling the chest discomfort.  I think it behooves [place] to get a clear answer on whether or not he has significant  coronary artery disease.  Obviously the risks and benefits of potential need for dual antiplatelet therapy will have to be discussed.  GI evaluation has been ordered by primary doctor.  In his chart he does have ASCVD listed however I do not see any other documentation in searching.  I do not see any prior documentation of stress test or cardiac catheterizations.  Hyperlipidemia -Atorvastatin 40.  Per primary care physician.  Continue to monitor.  Alcohol use -States that he has cut back.  Prior hospitalization in late December with delirium tremens.   Medication Adjustments/Labs and Tests Ordered: Current medicines are reviewed at length with the patient today.  Concerns regarding medicines are outlined above.  Orders Placed This Encounter  Procedures  . CBC  . Basic Metabolic Panel (BMET)  . EKG 12-Lead   No orders of the defined types were placed in this encounter.   Patient Instructions  Medication Instructions:  The current medical regimen is effective;  continue present plan and medications.  *If you need a refill on your cardiac medications before your next appointment, please call your pharmacy*  Lab Work: Please have blood work today (BMP, CBC)  If you have labs (blood work) drawn today and your tests are completely normal, you will receive your results only by: Marland Kitchen MyChart Message (if you have MyChart) OR . A paper copy in the mail If you have any lab test that is abnormal or we need to change your treatment, we will call you to review the results.  Testing/Procedures: Your physician has requested that you have a cardiac catheterization. Cardiac catheterization is used to diagnose and/or treat various heart conditions. Doctors may recommend this procedure for a number of different reasons. The most common reason is to evaluate chest pain. Chest pain can be a symptom of coronary artery disease (CAD), and cardiac catheterization can show whether plaque is narrowing or  blocking your heart's arteries. This procedure is also used to evaluate the valves, as well as measure the blood flow and oxygen levels in different parts of your heart. For further information please visit HugeFiesta.[email or web address]. Please follow instruction sheet, as given.  Follow-Up: Follow up after your cardiac cath  with Dr Marlou Porch or one of his team members.  Thank you for choosing [facility]!!       [facility] OFFICE [place], [place] [address] Dept: [phone] Loc: [place]  05/15/2019  You are scheduled for a Cardiac Catheterization on  Monday May 20, 2019 with Dr. Saunders Revel .  1. Please arrive at the [facility] (Main Entrance A) at [facility]: [place],  [address] at  8:30 AM  (two hours before your procedure to ensure your preparation). Free valet parking service is available.   Special note: Every effort is made to have your procedure done on time. Please understand that emergencies sometimes delay scheduled procedures.  2. Diet: Do not eat or drink anything after midnight prior to your procedure except sips of water to take medications.  3. Labs: Please have blood work today (CBC, BMP)   You will need a Covid screen complete before the cardiac cath.  This will be done at [facility].  Please stay in your car and in the right hand lane.  Follow the "pre-procedure" signs.  Appt for Friday 05/17/2019 at 1:25 PM.  4. Medication instructions in preparation for your procedure:  On the morning of your procedure, take Aspirin 81 mg and any other normal morning medicines.  You may use sips of water.  5. Plan for one night stay--bring personal belongings. 6. Bring a current list of your medications and current insurance cards. 7. You MUST have a responsible person to drive you home. 8. Someone MUST be with you the  first 24 hours after you arrive home or your discharge will be delayed. 9. Please wear clothes that are easy to get on and off and wear slip-on shoes.  Thank you for allowing [place] to care for you!   -- [facility] Cardi[facility] services     Signed, Candee Furbish, MD  05/17/2019 11:00 AM    [facility]   Addendum 05/17/2019:  After further review and request for prior authorization/peer review for cardiac catheterization, I am going to change his order to a Lexiscan stress test.  The rationale here is that if he is overall low risk from a stress test perspective, then he can safely proceed with his GI evaluation, upper and lower endoscopy.  With his heavy alcohol use, he certainly could have bleeding ulcer for instance.  If the stress test however is markedly abnormal, we will then proceed with heart catheterization in that setting.  This will also minimize the potential bleeding risk betrayed by cardiac catheterization.  Candee Furbish, MD

## 2019-05-16 ENCOUNTER — Telehealth: Payer: Self-pay | Admitting: *Deleted

## 2019-05-16 ENCOUNTER — Telehealth: Payer: Self-pay | Admitting: Cardiology

## 2019-05-16 ENCOUNTER — Other Ambulatory Visit: Payer: Self-pay | Admitting: Internal Medicine

## 2019-05-16 DIAGNOSIS — F10231 Alcohol dependence with withdrawal delirium: Secondary | ICD-10-CM

## 2019-05-16 DIAGNOSIS — F10931 Alcohol use, unspecified with withdrawal delirium: Secondary | ICD-10-CM

## 2019-05-16 NOTE — Telephone Encounter (Addendum)
Pt contacted pre-catheterization scheduled at [facility] for: Monday May 20, 2019 10:30 AM Verified arrival time and place: [facility]) at: 8:30 AM   No solid food after midnight prior to cath, clear liquids until 5 AM day of procedure. Contrast allergy: no   AM meds can be  taken pre-cath with sip of water including: ASA 81 mg-see Dr Marlou Porch 05/15/19 office note   Confirmed patient has responsible adult to drive home post procedure and observe 24 hours after arriving home: see notes below.  Currently, due to Covid-19 pandemic, only one support person will be allowed with patient. Must be the same support person for that patient's entire stay, will be screened and required to wear a mask. They will be asked to wait in the waiting room for the duration of the patient's stay.  Patients are required to wear a mask when they enter the hospital.  I attempted to contact patient at phone number listed for pt and received message that is non working phone number. I did speak with patient's niece, Gered Delponte [facility]), she confirmed that pt did not have a working phone number at this time. She states she is contact for him right now and helping him with medical appointments. I reviewed procedure instructions with Adele Barthel, including patient has responsible adult to drive home post procedure and observe 24 hours after arriving home-Michele states she and her aunt will work this out. Adele Barthel is aware that pt has pre procedure Covid-19 scheduled for tomorrow 05/17/19 at 1:25 PM. Per  Michele-pt is a bus rider but  has made arrangements with a neighbor to take him to Covid appt 05/17/19.  Selinda Eon is aware after Covid test patient should quarantine at home until procedure 05/20/19.

## 2019-05-16 NOTE — Telephone Encounter (Signed)
Ryan from Dr. Richelle Ito office calling on behalf of [employer] to give a call back number in regards to a peer to peer review for left heart cath that is scheduled.   Phone number [phone]

## 2019-05-17 ENCOUNTER — Encounter: Payer: Self-pay | Admitting: *Deleted

## 2019-05-17 ENCOUNTER — Telehealth: Payer: Self-pay | Admitting: Cardiology

## 2019-05-17 ENCOUNTER — Other Ambulatory Visit (HOSPITAL_COMMUNITY): Payer: Medicare HMO

## 2019-05-17 DIAGNOSIS — R079 Chest pain, unspecified: Secondary | ICD-10-CM

## 2019-05-17 NOTE — Telephone Encounter (Signed)
Spoke with niece who is aware covid test and cath for Monday have been cancelled and a lexiscan myoview has been ordered instead.  She will notify the patient.  Reviewed instructions for lexi - which she repeated with understanding.  She is aware she will be called for pt to be scheduled, since he does not have a working phone at this time. Cath cancelled with Barbaraann Rondo at cath lab.          Electronically signed by Shellia Cleverly, RN at 05/17/2019 11:52 AM

## 2019-05-17 NOTE — Telephone Encounter (Signed)
New Message:     Pt is having a Cath on Monday. His niece wants to know if she goes in with the pt for his procedure, will she be allowed to leave and come back in?

## 2019-05-17 NOTE — Telephone Encounter (Signed)
Lm on # listed to c/b to discuss peer to peer with Dr Marlou Porch

## 2019-05-17 NOTE — Telephone Encounter (Signed)
Spoke with niece who is aware covid test and cath for Monday have been cancelled and a lexiscan myoview has been ordered instead.  She will notify the patient.  Reviewed instructions for lexi - which she repeated with understanding.  She is aware she will be called for pt to be scheduled, since he does not have a working phone at this time. Cath cancelled with Barbaraann Rondo at cath lab.

## 2019-05-17 NOTE — Telephone Encounter (Signed)
Addendum 05/17/2019:  After further review and request for prior authorization/peer review for cardiac catheterization, I am going to change his order to a Lexiscan stress test.  The rationale here is that if he is overall low risk from a stress test perspective, then he can safely proceed with his GI evaluation, upper and lower endoscopy.  With his heavy alcohol use, he certainly could have bleeding ulcer for instance.  If the stress test however is markedly abnormal, we will then proceed with heart catheterization in that setting.  This will also minimize the potential bleeding risk betrayed by cardiac catheterization.  Candee Furbish, MD  Attempted to contact patient at phone number listed in chart.  Received message this is not a working number.  [name] - pt's niece is listed on his DPR.  Will attempt to contact her at

## 2019-05-17 NOTE — Telephone Encounter (Signed)
Called from [place] from Dr [facility] office didn't occur.  Dr Marlou Porch did cancel the cath and ordered lexiscan.

## 2019-05-17 NOTE — Telephone Encounter (Signed)
Addendum 05/17/2019:  After further review and request for prior authorization/peer review for cardiac catheterization, I am going to change his order to a Lexiscan stress test.  The rationale here is that if he is overall low risk from a stress test perspective, then he can safely proceed with his GI evaluation, upper and lower endoscopy.  With his heavy alcohol use, he certainly could have bleeding ulcer for instance.  If the stress test however is markedly abnormal, we will then proceed with heart catheterization in that setting.  This will also minimize the potential bleeding risk betrayed by cardiac catheterization.  Candee Furbish, MD  Left message on niece's # to call back to review the above information.  Also advised I am not able to contact pt at his number as listed (not a working number).  Included in this message is that pt does not need to have Covid screening today.

## 2019-05-18 ENCOUNTER — Telehealth: Payer: Self-pay | Admitting: Internal Medicine

## 2019-05-18 NOTE — Telephone Encounter (Signed)
Called Dan Henry to discuss his lab results and left a voice mail.

## 2019-05-20 ENCOUNTER — Encounter (HOSPITAL_COMMUNITY): Admission: RE | Payer: Medicare HMO | Source: Home / Self Care

## 2019-05-20 ENCOUNTER — Ambulatory Visit (HOSPITAL_COMMUNITY): Admission: RE | Admit: 2019-05-20 | Payer: Medicare HMO | Source: Home / Self Care | Admitting: Internal Medicine

## 2019-05-20 SURGERY — LEFT HEART CATH AND CORONARY ANGIOGRAPHY
Anesthesia: LOCAL

## 2019-05-23 ENCOUNTER — Telehealth: Payer: Self-pay | Admitting: Internal Medicine

## 2019-05-23 NOTE — Telephone Encounter (Signed)
Pt calling back regarding results [phone]

## 2019-05-24 ENCOUNTER — Encounter (HOSPITAL_COMMUNITY): Payer: Medicare HMO

## 2019-05-28 ENCOUNTER — Telehealth (HOSPITAL_COMMUNITY): Payer: Self-pay | Admitting: *Deleted

## 2019-05-28 NOTE — Telephone Encounter (Signed)
Patient given detailed instructions per Myocardial Perfusion Study Information Sheet for the test on 05/31/19 at 10:15. Patient notified to arrive 15 minutes early and that it is imperative to arrive on time for appointment to keep from having the test rescheduled.  If you need to cancel or reschedule your appointment, please call the office within 24 hours of your appointment. . Patient verbalized understanding.Dan Henry

## 2019-05-31 ENCOUNTER — Other Ambulatory Visit: Payer: Self-pay

## 2019-05-31 ENCOUNTER — Ambulatory Visit (HOSPITAL_COMMUNITY): Payer: Medicare HMO | Attending: Cardiovascular Disease

## 2019-05-31 ENCOUNTER — Encounter (INDEPENDENT_AMBULATORY_CARE_PROVIDER_SITE_OTHER): Payer: Self-pay

## 2019-05-31 DIAGNOSIS — R079 Chest pain, unspecified: Secondary | ICD-10-CM | POA: Diagnosis not present

## 2019-05-31 LAB — MYOCARDIAL PERFUSION IMAGING
LV dias vol: 70 mL (ref 62–150)
LV sys vol: 24 mL
Peak HR: 144 {beats}/min
Rest HR: 96 {beats}/min
SDS: 0
SRS: 0
SSS: 0
TID: 1.08

## 2019-05-31 MED ORDER — TECHNETIUM TC 99M TETROFOSMIN IV KIT
10.1000 | PACK | Freq: Once | INTRAVENOUS | Status: AC | PRN
  Administered 2019-05-31: 10.1 via INTRAVENOUS
  Filled 2019-05-31: qty 11

## 2019-05-31 MED ORDER — REGADENOSON 0.4 MG/5ML IV SOLN
0.4000 mg | Freq: Once | INTRAVENOUS | Status: AC
  Administered 2019-05-31: 0.4 mg via INTRAVENOUS

## 2019-05-31 MED ORDER — AMINOPHYLLINE 25 MG/ML IV SOLN
75.0000 mg | Freq: Once | INTRAVENOUS | Status: AC
  Administered 2019-05-31: 75 mg via INTRAVENOUS

## 2019-05-31 MED ORDER — TECHNETIUM TC 99M TETROFOSMIN IV KIT
32.8000 | PACK | Freq: Once | INTRAVENOUS | Status: AC | PRN
  Administered 2019-05-31: 32.8 via INTRAVENOUS
  Filled 2019-05-31: qty 33

## 2019-05-31 NOTE — Telephone Encounter (Signed)
Please call pt or if you have please let triage know in this encounter, thank you

## 2019-05-31 NOTE — Addendum Note (Signed)
Addended by: Hulan Fray on: 05/31/2019 07:28 AM   Modules accepted: Orders

## 2019-05-31 NOTE — Telephone Encounter (Signed)
Hi Helen,   I called the patient last week.  Thanks

## 2019-06-03 ENCOUNTER — Encounter: Payer: Self-pay | Admitting: Internal Medicine

## 2019-06-10 ENCOUNTER — Encounter (HOSPITAL_COMMUNITY): Payer: Self-pay | Admitting: Emergency Medicine

## 2019-06-10 ENCOUNTER — Emergency Department (HOSPITAL_COMMUNITY)
Admission: EM | Admit: 2019-06-10 | Discharge: 2019-06-10 | Disposition: A | Discharge status: Home / Self Care | Payer: Medicare HMO | Attending: Emergency Medicine | Admitting: Emergency Medicine

## 2019-06-10 ENCOUNTER — Ambulatory Visit: Payer: Medicare HMO | Admitting: Cardiology

## 2019-06-10 ENCOUNTER — Other Ambulatory Visit: Payer: Self-pay

## 2019-06-10 DIAGNOSIS — I10 Essential (primary) hypertension: Secondary | ICD-10-CM | POA: Diagnosis not present

## 2019-06-10 DIAGNOSIS — R040 Epistaxis: Secondary | ICD-10-CM | POA: Diagnosis not present

## 2019-06-10 DIAGNOSIS — R000 Tachycardia, unspecified: Secondary | ICD-10-CM | POA: Diagnosis not present

## 2019-06-10 DIAGNOSIS — Z5321 Procedure and treatment not carried out due to patient leaving prior to being seen by health care provider: Secondary | ICD-10-CM | POA: Insufficient documentation

## 2019-06-10 LAB — CBC WITH DIFFERENTIAL/PLATELET
Abs Immature Granulocytes: 0.03 10*3/uL (ref 0.00–0.07)
Basophils Absolute: 0.1 10*3/uL (ref 0.0–0.1)
Basophils Relative: 3 %
Eosinophils Absolute: 0 10*3/uL (ref 0.0–0.5)
Eosinophils Relative: 0 %
HCT: 27.5 % — ABNORMAL LOW (ref 39.0–52.0)
Hemoglobin: 8.3 g/dL — ABNORMAL LOW (ref 13.0–17.0)
Immature Granulocytes: 1 %
Lymphocytes Relative: 22 %
Lymphs Abs: 0.8 10*3/uL (ref 0.7–4.0)
MCH: 22.5 pg — ABNORMAL LOW (ref 26.0–34.0)
MCHC: 30.2 g/dL (ref 30.0–36.0)
MCV: 74.5 fL — ABNORMAL LOW (ref 80.0–100.0)
Monocytes Absolute: 0.3 10*3/uL (ref 0.1–1.0)
Monocytes Relative: 9 %
Neutro Abs: 2.4 10*3/uL (ref 1.7–7.7)
Neutrophils Relative %: 65 %
Platelets: 136 10*3/uL — ABNORMAL LOW (ref 150–400)
RBC: 3.69 MIL/uL — ABNORMAL LOW (ref 4.22–5.81)
RDW: 20.3 % — ABNORMAL HIGH (ref 11.5–15.5)
WBC: 3.7 10*3/uL — ABNORMAL LOW (ref 4.0–10.5)
nRBC: 0 % (ref 0.0–0.2)

## 2019-06-10 LAB — COMPREHENSIVE METABOLIC PANEL
ALT: 135 U/L — ABNORMAL HIGH (ref 0–44)
AST: 345 U/L — ABNORMAL HIGH (ref 15–41)
Albumin: 4 g/dL (ref 3.5–5.0)
Alkaline Phosphatase: 61 U/L (ref 38–126)
Anion gap: 15 (ref 5–15)
BUN: 10 mg/dL (ref 8–23)
CO2: 20 mmol/L — ABNORMAL LOW (ref 22–32)
Calcium: 8.5 mg/dL — ABNORMAL LOW (ref 8.9–10.3)
Chloride: 103 mmol/L (ref 98–111)
Creatinine, Ser: 0.91 mg/dL (ref 0.61–1.24)
GFR calc Af Amer: >60 mL/min (ref >60)
GFR calc non Af Amer: >60 mL/min (ref >60)
Glucose, Bld: 109 mg/dL — ABNORMAL HIGH (ref 70–99)
Potassium: 3.3 mmol/L — ABNORMAL LOW (ref 3.5–5.1)
Sodium: 138 mmol/L (ref 135–145)
Total Bilirubin: 0.9 mg/dL (ref 0.3–1.2)
Total Protein: 7.7 g/dL (ref 6.5–8.1)

## 2019-06-10 LAB — PROTIME-INR
INR: 0.9 (ref 0.8–1.2)
Prothrombin Time: 12.2 seconds (ref 11.4–15.2)

## 2019-06-10 NOTE — ED Notes (Signed)
Pt stated he was leaving, IV was removed.

## 2019-06-10 NOTE — ED Triage Notes (Signed)
Patient arrived with EMS from home reports epistaxis at both nares this evening , denies injury /repirations unlabored , still bleeding at arrival . No anticoagulant /hypertensive at arrival .

## 2019-06-11 ENCOUNTER — Encounter (HOSPITAL_COMMUNITY): Payer: Self-pay | Admitting: Emergency Medicine

## 2019-06-11 ENCOUNTER — Emergency Department (HOSPITAL_COMMUNITY)
Admission: EM | Admit: 2019-06-11 | Discharge: 2019-06-11 | Disposition: A | Discharge status: Home / Self Care | Payer: Medicare HMO | Attending: Emergency Medicine | Admitting: Emergency Medicine

## 2019-06-11 DIAGNOSIS — I10 Essential (primary) hypertension: Secondary | ICD-10-CM | POA: Diagnosis not present

## 2019-06-11 DIAGNOSIS — R040 Epistaxis: Secondary | ICD-10-CM | POA: Diagnosis not present

## 2019-06-11 DIAGNOSIS — D696 Thrombocytopenia, unspecified: Secondary | ICD-10-CM | POA: Insufficient documentation

## 2019-06-11 DIAGNOSIS — D649 Anemia, unspecified: Secondary | ICD-10-CM | POA: Insufficient documentation

## 2019-06-11 DIAGNOSIS — R000 Tachycardia, unspecified: Secondary | ICD-10-CM | POA: Diagnosis not present

## 2019-06-11 DIAGNOSIS — Z79899 Other long term (current) drug therapy: Secondary | ICD-10-CM | POA: Insufficient documentation

## 2019-06-11 NOTE — Discharge Instructions (Addendum)
Your nosebleed was controlled with silver nitrate If it recurs, hold pressure as discussed for at least 20 minutes Your blood count and platelets are low.  Please take your iron.  Recheck with your doctor this week.  Return if ongoing bleeding that is not controlled.

## 2019-06-11 NOTE — ED Triage Notes (Signed)
Pt came in [facility] c/o of noseblled. Pt was seen here yesterday but left AMA d/t wait time. Started around 7pm last night, stopped and restarted at 5am. Pt complains of palpitations at 0600. Pt says he took BP meds around 0400,  18GLFA

## 2019-06-11 NOTE — ED Provider Notes (Signed)
[facility] EMERGENCY DEPARTMENT Provider Note   CSN: [id number] Arrival date & time: 06/11/19  [id number]     History Chief Complaint  Patient presents with  . Epistaxis    Dan Henry is a 69 y.o. male.  HPI  69 yo male ho epistaxis, iron deficiency anemia, etoh abuse presents with nosebleed.  Patient tells me his nose began bleeding at 0730 (at [facility]) so time frame is unclear.  He denies injury, weakness, or lightheadedness.  He admits to recent alcohol use.  He thinks he is taking his iron- prescribed on discharge 12/24 after admission for anemia.  Review of records reveals stable hgb but decreasing platelets. He denies other bleeding.  He thinks the blood is coming from the left nares.  He is unable to tell me if he did anything to stop it, but it is not bleeding now.  He does not know what causes it, but has had many nosebleeds in the past. He thinks he has seen ent in the distant past but is unable to tell me who he has seen stating that he had a broken blood vessel in his nose.      Past Medical History:  Diagnosis Date  . Alcohol withdrawal delirium ([place]) 02/14/2019  . Hypertension   . Hypokalemia 07/19/2017  . Malnutrition of moderate degree 10/24/2018    Patient Active Problem List   Diagnosis Date Noted  . Stable angina ([place]) 05/08/2019  . Alcoholic hepatitis   . Nosebleed 04/23/2019  . Alcohol withdrawal ([place]) 04/23/2019  . Anemia 02/26/2019  . Urinary retention 10/25/2018  . Alcohol use disorder, severe, in early remission ([place]) 10/23/2018  . Hepatic hemangioma 10/19/2018  . Visual hallucinations 06/26/2018  . Abnormal TSH 06/26/2018  . Need for hepatitis B vaccination 02/21/2018  . Increased anion gap metabolic acidosis [id number]  . Unintentional weight loss 02/16/2018  . Benign essential tremor 07/19/2017  . Elevated LFTs 05/16/2017  . ASCVD (arteriosclerotic cardiovascular disease) 05/16/2017  . Right carotid bruit 05/08/2017  . Essential  hypertension 05/21/2015  . Healthcare maintenance 05/21/2015    Past Surgical History:  Procedure Laterality Date  . NO PAST SURGERIES         Family History  Problem Relation Age of Onset  . Hypertension Mother   . Hypertension Father   . Hypertension Sister   . Hypertension Sister   . Stroke Sister   . Cancer Brother        throat    Social History   Tobacco Use  . Smoking status: Never Smoker  . Smokeless tobacco: Never Used  Substance Use Topics  . Alcohol use: Yes    Alcohol/week: 4.0 standard drinks    Types: 4 Standard drinks or equivalent per week    Comment: occ  . Drug use: No    Home Medications Prior to Admission medications   Medication Sig Start Date End Date Taking? Authorizing Provider  amLODipine (NORVASC) 10 MG tablet Take 1 tablet (10 mg total) by mouth daily. 02/26/19   Neva Seat, MD  atorvastatin (LIPITOR) 40 MG tablet Take 1 tablet (40 mg total) by mouth daily. 07/03/18   Oval Linsey, MD  feeding supplement, ENSURE ENLIVE, (ENSURE ENLIVE) LIQD Take 237 mLs by mouth 2 (two) times daily between meals. 10/28/18   [name], MD  ferrous sulfate 325 (65 FE) MG tablet TAKE 1 TABLET BY MOUTH EVERY DAY 05/16/19   [name], MD  folic acid (FOLVITE) 1 MG tablet TAKE  1 TABLET BY MOUTH EVERY DAY 05/16/19   [name], MD  lisinopril (ZESTRIL) 10 MG tablet Take 1 tablet (10 mg total) by mouth daily. 02/27/19 02/27/20  Neva Seat, MD  Multiple Vitamin (MULTIVITAMIN WITH MINERALS) TABS tablet Take 1 tablet by mouth every morning.    [provider]  naltrexone (DEPADE) 50 MG tablet Take 1 tablet by mouth once daily 02/19/19   Agyei, Caprice Kluver, MD  nitroGLYCERIN (NITROSTAT) 0.4 MG SL tablet Place 1 tablet (0.4 mg total) under the tongue every 5 (five) minutes as needed for chest pain. 05/08/19   [name], MD  tamsulosin (FLOMAX) 0.4 MG CAPS capsule Take 1 capsule by mouth once daily 02/19/19   [name], MD  thiamine 100 MG  tablet Take 1 tablet (100 mg total) by mouth daily. 04/26/19   Jeanmarie Hubert, MD    Allergies    Patient has no known allergies.  Review of Systems   Review of Systems  All other systems reviewed and are negative.   Physical Exam Updated Vital Signs There were no vitals taken for this visit.  Physical Exam Vitals and nursing note reviewed.  Constitutional:      General: He is not in acute distress.    Comments: Unkempt appearing  HENT:     Head: Atraumatic.     Nose:     Comments: Dried blood at left nares    Mouth/Throat:     Comments: Dark tongue discoloration Eyes:     Extraocular Movements: Extraocular movements intact.     Pupils: Pupils are equal, round, and reactive to light.  Cardiovascular:     Rate and Rhythm: Regular rhythm. Tachycardia present.  Pulmonary:     Effort: Pulmonary effort is normal.     Breath sounds: Normal breath sounds.  Abdominal:     Palpations: Abdomen is soft.  Musculoskeletal:     Cervical back: Normal range of motion.  Skin:    General: Skin is warm.  Neurological:     General: No focal deficit present.     Mental Status: He is alert and oriented to person, place, and time.  Psychiatric:        Mood and Affect: Mood normal.     ED Results / Procedures / Treatments   Labs (all labs ordered are listed, but only abnormal results are displayed) Labs Reviewed - No data to display  EKG None  Radiology No results found.  Procedures .Epistaxis Management  Date/Time: 06/11/2019 8:08 AM Performed by: Pattricia Boss, MD Authorized by: Pattricia Boss, MD   Consent:    Consent obtained:  Verbal   Consent given by:  Patient   Risks discussed:  Bleeding and nasal injury   Alternatives discussed:  No treatment Anesthesia (see MAR for exact dosages):    Anesthesia method:  Topical application   Topical anesthesia: cetocaine. Procedure details:    Treatment site:  R anterior   Treatment method:  Silver nitrate   Treatment  complexity:  Limited   Treatment episode: initial   Post-procedure details:    Assessment:  Bleeding stopped   Patient tolerance of procedure:  Tolerated well, no immediate complications Comments:     Patient cleared nares of clots Bleeding noted at right anterior nares Cetacaine applied Silver nitrate applied Bleeding resolved    (including critical care time)  Medications Ordered in ED Medications - No data to display  ED Course  I have reviewed the triage vital signs and the  nursing notes.  Pertinent labs & imaging results that were available during my care of the patient were reviewed by me and considered in my medical decision making (see chart for details).    MDM Rules/Calculators/A&P                     Observed with no return of bleeding Discussed return precautions and need for follow up. Anemia- stable Thrombocytopenia worsening- advised follow up with pmd Referred to ent if continued problems with nose bleeding Final Clinical Impression(s) / ED Diagnoses Final diagnoses:  Right-sided epistaxis  Anemia, unspecified type  Thrombocytopenia ([place])    Rx / DC Orders ED Discharge Orders    None    Your nosebleed was controlled with silver nitrate If it recurs, hold pressure as discussed for at least 20 minutes Your blood count and platelets are low.  Please take your iron.  Recheck with your doctor this week.  Return if ongoing bleeding that is not controlled.     Pattricia Boss, MD 06/11/19 [phone]

## 2019-06-12 NOTE — Addendum Note (Signed)
Addended by: Hulan Fray on: 06/12/2019 06:45 PM   Modules accepted: Orders

## 2019-06-13 ENCOUNTER — Encounter: Payer: Self-pay | Admitting: Internal Medicine

## 2019-06-13 ENCOUNTER — Encounter: Payer: Medicare HMO | Admitting: Internal Medicine

## 2019-06-21 ENCOUNTER — Other Ambulatory Visit: Payer: Self-pay | Admitting: Internal Medicine

## 2019-06-21 DIAGNOSIS — I10 Essential (primary) hypertension: Secondary | ICD-10-CM

## 2019-07-03 ENCOUNTER — Ambulatory Visit: Payer: Self-pay | Admitting: *Deleted

## 2019-07-03 ENCOUNTER — Telehealth: Payer: Medicare HMO

## 2019-07-03 NOTE — Chronic Care Management (AMB) (Signed)
  Care Management   Note  07/03/2019 Name: Dan Henry MRN: [id number] DOB: [birth year]   Patient's niece, Driston Aderholt,  returned call to this [facility] as the contact number for the patient is Michelle's phone. The patient does not have a working phone number and [name] says he will not complete the application she took to him to secure a free phone for him. She states she will discuss the care management program with patient this weekend and call this RNCM back next week. She also states patient has a history of agreeing to services but then will not actively participate. She gave the example of a home health  RN calling her because patient would not answer the door when they arrived for the intake visit.  If niece does not return call, the care management team will reach out to the patient again over the next 7 days.   Danella Maiers, CCM, [facility] RN Care Manager [phone]

## 2019-07-03 NOTE — Chronic Care Management (AMB) (Signed)
  Care Management   Outreach Note  07/03/2019 Name: Dan Henry MRN: [id number] DOB: 09-20-1950  Referred by: [name], MD Reason for referral : Care Coordination (Initial telephone outreach HTN)   An unsuccessful telephone outreach was attempted today. The patient was referred to the case management team for assistance with care management and care coordination related  to improving HTN self management strategies and adherence to provider appointments.   Follow Up Plan: A HIPPA compliant phone message was left for the patient providing contact information and requesting a return call.   Kelli Churn RN, CCM ,[facility] RN Care Manager [phone]

## 2019-07-08 ENCOUNTER — Ambulatory Visit: Payer: Self-pay | Admitting: *Deleted

## 2019-07-08 DIAGNOSIS — I2510 Atherosclerotic heart disease of native coronary artery without angina pectoris: Secondary | ICD-10-CM

## 2019-07-08 DIAGNOSIS — I10 Essential (primary) hypertension: Secondary | ICD-10-CM

## 2019-07-08 NOTE — Chronic Care Management (AMB) (Addendum)
  Chronic Care Management   Note  07/08/2019 Name: Dan Henry MRN: [id number] DOB: [birth year]   Patient's niece Dan Henry returned call to this RNCM. She said she spoke to her uncle and he is receptive to hearing more about the chronic care management program but he does not have a working phone and will not complete paperwork to get a free phone so it will be difficult to contact patient for telephone assessments without burdening Dan Henry, who is already overwhelmed with working 2 jobs and providing care to several other family members. Dan Henry says Dan Henry has a long history of not keeping provider appointments and then calls her to arrange for an urgent appointment or to take him to the emergency room when he doesn't fell well.  Follow up plan: The care management team will reach out to the patient again over the next 30 days and/or niece will contact this RNCM if she schedules an urgent office visit for him in the next few weeks so this RNCM can meet with patient face to face to discuss program and assess his motivation and/or ability to participate in the program.  Kelli Churn RN, CCM, [facility] RN Care Manager [phone]

## 2019-07-25 ENCOUNTER — Ambulatory Visit: Payer: Self-pay | Admitting: *Deleted

## 2019-07-25 DIAGNOSIS — I2510 Atherosclerotic heart disease of native coronary artery without angina pectoris: Secondary | ICD-10-CM

## 2019-07-25 DIAGNOSIS — I10 Essential (primary) hypertension: Secondary | ICD-10-CM

## 2019-07-25 NOTE — Chronic Care Management (AMB) (Signed)
  Chronic Care Management   Note  07/25/2019 Name: Traevon Lathem MRN: [id number] DOB: 05-02-1951  Mr Groesbeck does not have a working phone and according to his niece [name] he doesn't want one. [name] took a form to him for him to complete to qualify him for a free phone and he will not fill the form out.  The contact number on the patient's chart is the niece's phone, [name].  In order to conduct any type of follow up/care management with the patient, it will mean the niece will have to drive to his home to provide a working phone. She is working two jobs and trying to help care for other family members in addition to Mr. Kabir. Therefore the burden of Mr. Knope follow up CCM care would depend on the niece.  [name] stated she notify this RNCM when Mr Stroud has an appointment at the clinic so the CCM team can meet with him face to face to explain our services and engage him if he will agree to securing a phone.  Dr. Eileen Stanford notified that CCM is closing referral today.  Follow up plan: No further follow up required: Unable to contact patient as he has no working phone. Closing referral to CCM services.  Kelli Churn RN, CCM, [facility] RN Care Manager [phone]

## 2019-10-26 IMAGING — US ULTRASOUND ABDOMEN COMPLETE
1 series · 14 of 25 positions shown · non-contrast
Comparison: 05/24/2017

CLINICAL DATA: Pheochromocytoma workup, evaluate kidneys and
adrenal glands., follow-up liver lesion

EXAM:
COMPLETE ABDOMINAL ULTRASOUND

[Series 2: ultrasound abdomen complete · 14 of 89 slices shown]
[im 1/89]
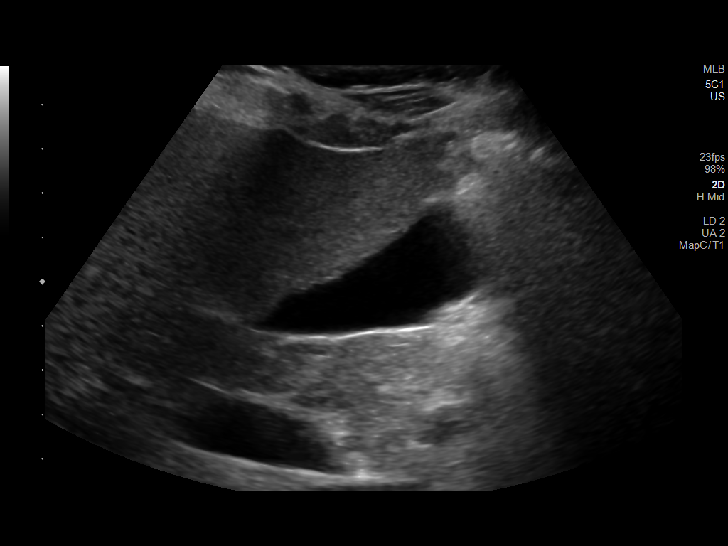
[im 8/89]
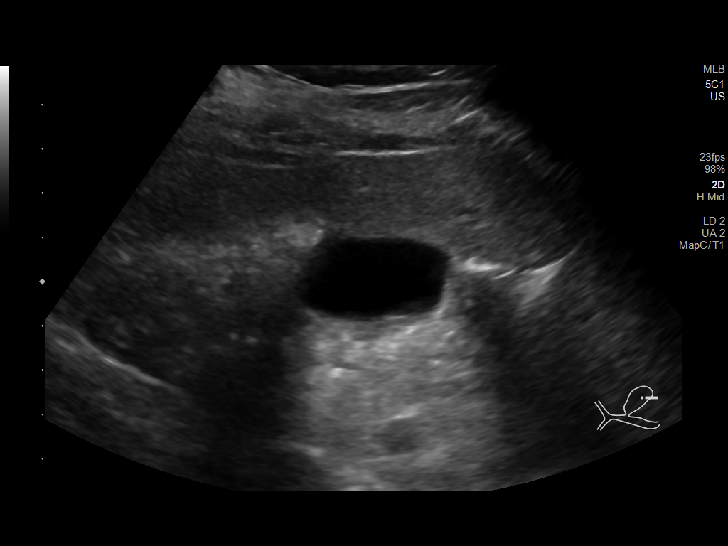
[im 15/89]
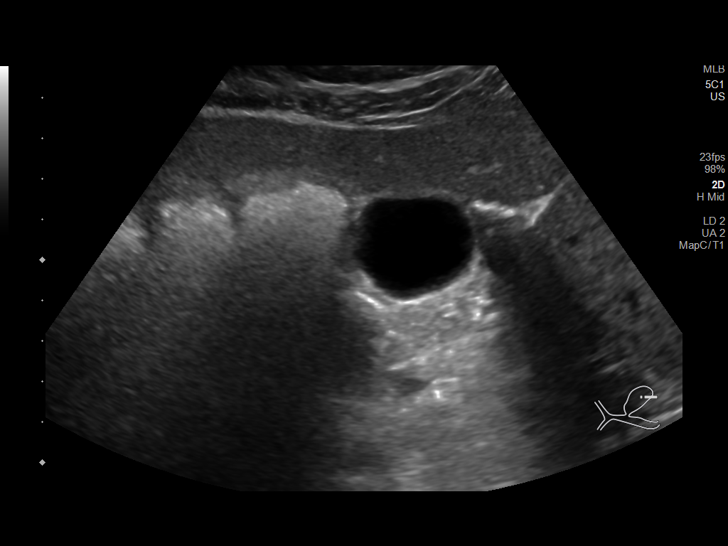
[im 23/89]
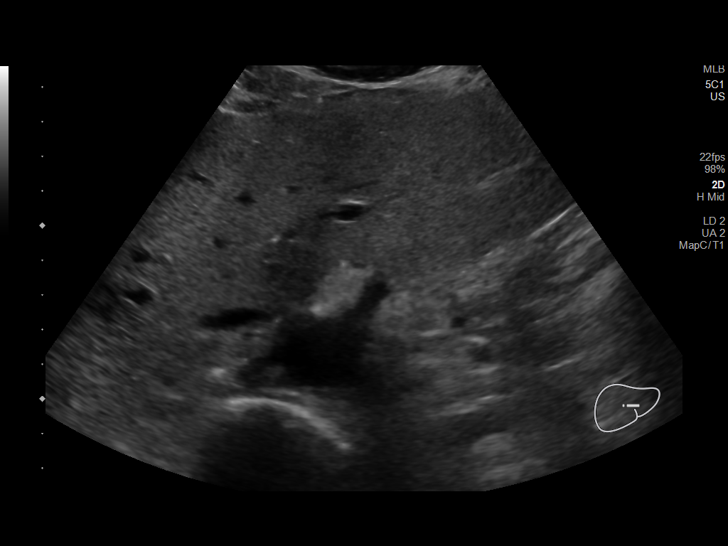
[im 30/89]
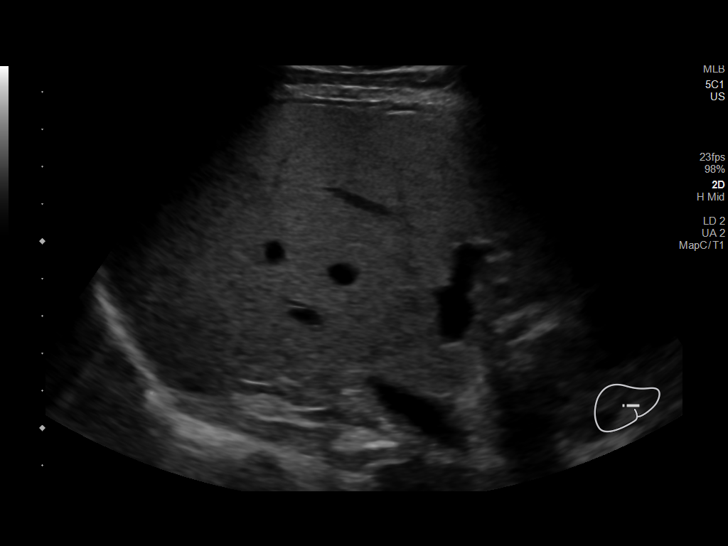
[im 34/89]
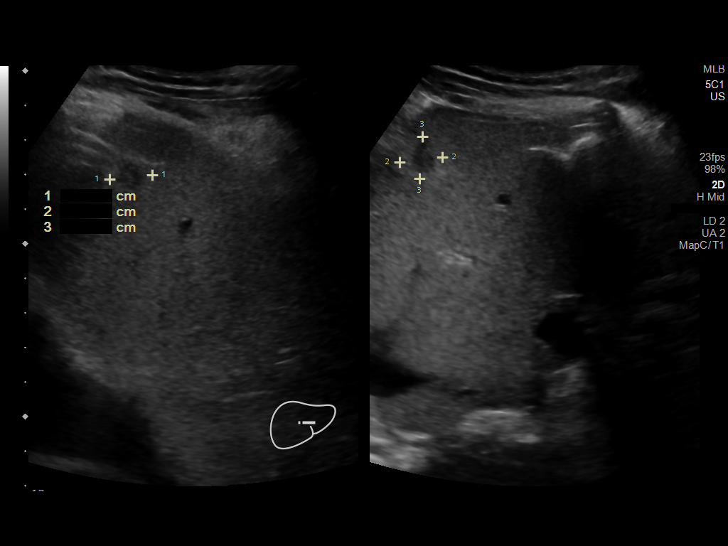
[im 41/89]
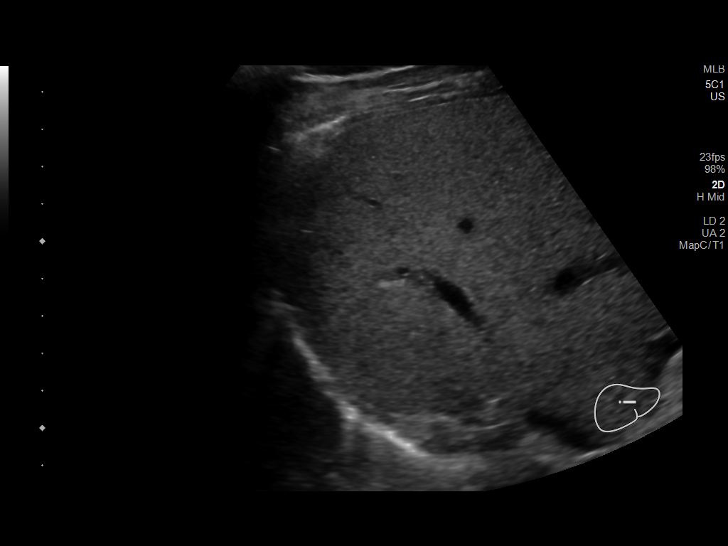
[im 48/89]
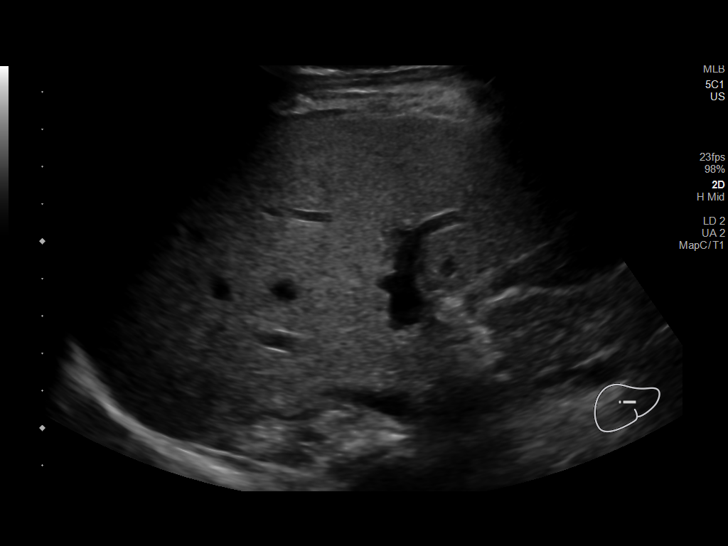
[im 56/89]
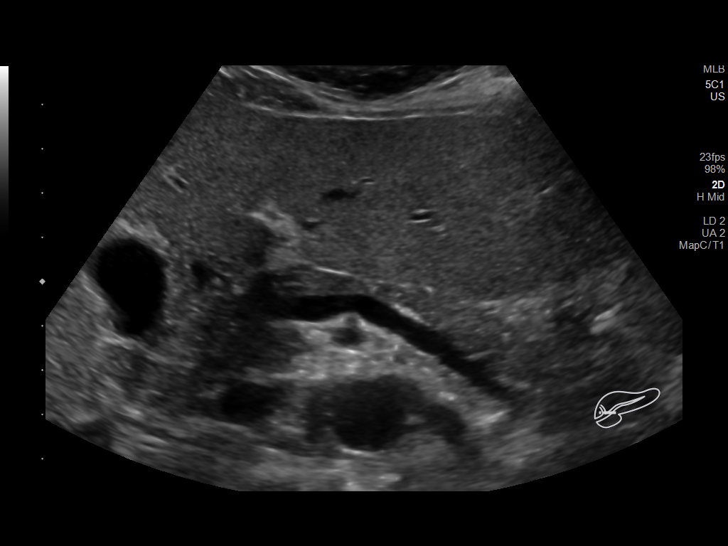
[im 59/89]
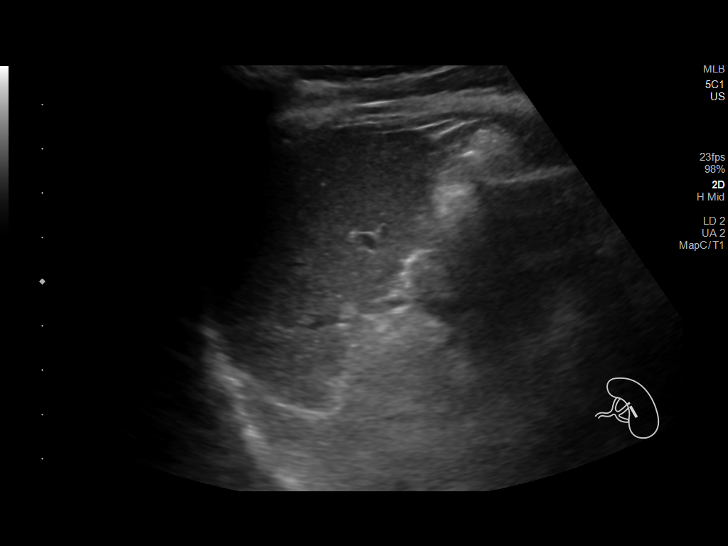
[im 67/89]
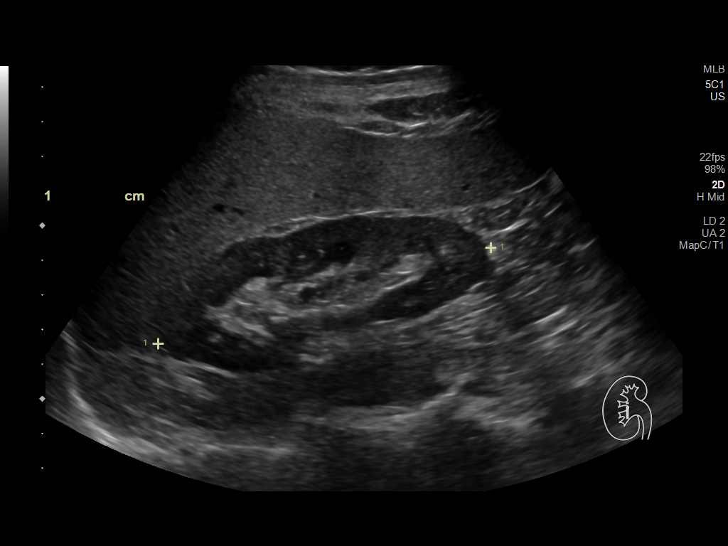
[im 74/89]
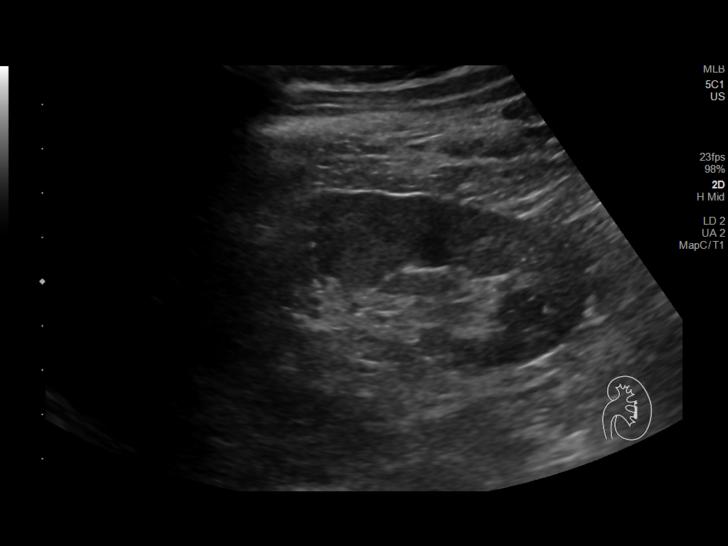
[im 81/89]
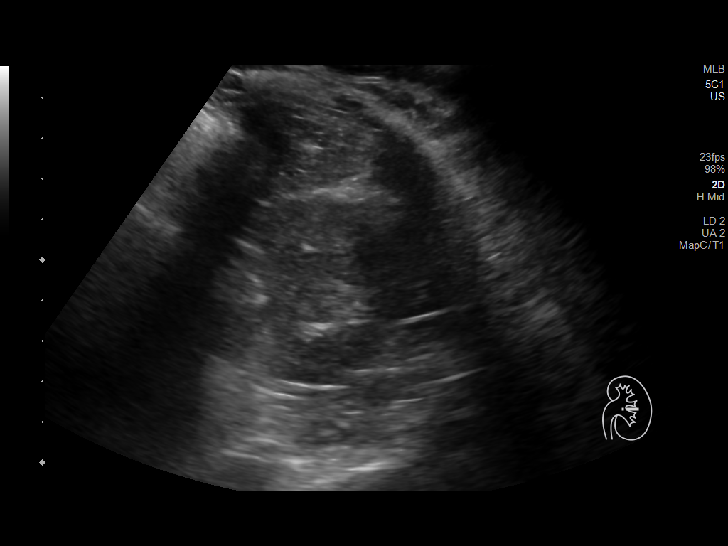
[im 89/89]
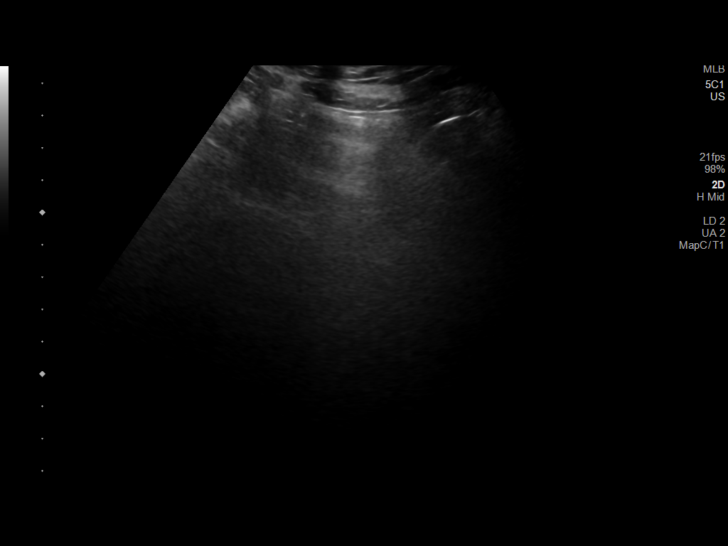

[14 of 25 positions shown; findings below may reference images not displayed]

FINDINGS: Gallbladder: Physiologically distended without stones, wall
thickening, or pericholecystic fluid. Sonographer reports no
sonographic Murphy's sign.

Common bile duct:  Normal in caliber, 4mm diameter.

Liver: 1.2 cm hypoechoic subcapsular lesion in the right lobe.
Background parenchyma unremarkable. No biliary ductal dilatation.
Antegrade color flow signal in the main portal vein.

IVC:  Negative

Pancreas:  Negative

Spleen:  No focal lesion, craniocaudal 5cm in length.

Right Kidney:  No mass or hydronephrosis, 10cm in length.

Left Kidney:  No lesion or hydronephrosis, 9.6cm in length.

Abdominal aorta:  Negative
IMPRESSION: 1. Nonspecific 1.2 cm right lobe liver lesion. Liver protocol MRI
with contrast may be useful for more definitive characterization.

## 2019-11-17 ENCOUNTER — Emergency Department (HOSPITAL_COMMUNITY): Payer: Medicare HMO

## 2019-11-17 ENCOUNTER — Other Ambulatory Visit: Payer: Self-pay

## 2019-11-17 ENCOUNTER — Observation Stay (HOSPITAL_COMMUNITY)
Admission: EM | Admit: 2019-11-17 | Discharge: 2019-11-19 | Disposition: A | Discharge status: Home / Self Care | Payer: Medicare HMO | Attending: Internal Medicine | Admitting: Internal Medicine

## 2019-11-17 DIAGNOSIS — I10 Essential (primary) hypertension: Secondary | ICD-10-CM | POA: Diagnosis not present

## 2019-11-17 DIAGNOSIS — Z20822 Contact with and (suspected) exposure to covid-19: Secondary | ICD-10-CM | POA: Insufficient documentation

## 2019-11-17 DIAGNOSIS — D509 Iron deficiency anemia, unspecified: Secondary | ICD-10-CM | POA: Diagnosis not present

## 2019-11-17 DIAGNOSIS — F10129 Alcohol abuse with intoxication, unspecified: Secondary | ICD-10-CM | POA: Insufficient documentation

## 2019-11-17 DIAGNOSIS — E872 Acidosis, unspecified: Secondary | ICD-10-CM

## 2019-11-17 DIAGNOSIS — K7010 Alcoholic hepatitis without ascites: Secondary | ICD-10-CM | POA: Diagnosis present

## 2019-11-17 DIAGNOSIS — R55 Syncope and collapse: Secondary | ICD-10-CM | POA: Diagnosis present

## 2019-11-17 DIAGNOSIS — Y907 Blood alcohol level of 200-239 mg/100 ml: Secondary | ICD-10-CM | POA: Diagnosis not present

## 2019-11-17 DIAGNOSIS — N179 Acute kidney failure, unspecified: Secondary | ICD-10-CM | POA: Diagnosis not present

## 2019-11-17 DIAGNOSIS — G250 Essential tremor: Secondary | ICD-10-CM | POA: Diagnosis present

## 2019-11-17 DIAGNOSIS — R29810 Facial weakness: Secondary | ICD-10-CM | POA: Diagnosis not present

## 2019-11-17 DIAGNOSIS — I998 Other disorder of circulatory system: Secondary | ICD-10-CM | POA: Diagnosis not present

## 2019-11-17 DIAGNOSIS — R404 Transient alteration of awareness: Secondary | ICD-10-CM | POA: Diagnosis not present

## 2019-11-17 DIAGNOSIS — Z79899 Other long term (current) drug therapy: Secondary | ICD-10-CM | POA: Diagnosis not present

## 2019-11-17 DIAGNOSIS — F10929 Alcohol use, unspecified with intoxication, unspecified: Secondary | ICD-10-CM

## 2019-11-17 DIAGNOSIS — I6529 Occlusion and stenosis of unspecified carotid artery: Secondary | ICD-10-CM | POA: Diagnosis not present

## 2019-11-17 DIAGNOSIS — I739 Peripheral vascular disease, unspecified: Secondary | ICD-10-CM | POA: Diagnosis not present

## 2019-11-17 DIAGNOSIS — R4020 Unspecified coma: Secondary | ICD-10-CM | POA: Diagnosis not present

## 2019-11-17 DIAGNOSIS — I499 Cardiac arrhythmia, unspecified: Secondary | ICD-10-CM | POA: Diagnosis not present

## 2019-11-17 LAB — MAGNESIUM: Magnesium: 1.6 mg/dL — ABNORMAL LOW (ref 1.7–2.4)

## 2019-11-17 LAB — RAPID URINE DRUG SCREEN, HOSP PERFORMED
Amphetamines: NOT DETECTED
Barbiturates: NOT DETECTED
Benzodiazepines: NOT DETECTED
Cocaine: NOT DETECTED
Opiates: NOT DETECTED
Tetrahydrocannabinol: NOT DETECTED

## 2019-11-17 LAB — COMPREHENSIVE METABOLIC PANEL
ALT: 59 U/L — ABNORMAL HIGH (ref 0–44)
AST: 105 U/L — ABNORMAL HIGH (ref 15–41)
Albumin: 4.4 g/dL (ref 3.5–5.0)
Alkaline Phosphatase: 43 U/L (ref 38–126)
Anion gap: 21 — ABNORMAL HIGH (ref 5–15)
BUN: 13 mg/dL (ref 8–23)
CO2: 14 mmol/L — ABNORMAL LOW (ref 22–32)
Calcium: 9.1 mg/dL (ref 8.9–10.3)
Chloride: 102 mmol/L (ref 98–111)
Creatinine, Ser: 1.53 mg/dL — ABNORMAL HIGH (ref 0.61–1.24)
GFR calc Af Amer: 53 mL/min — ABNORMAL LOW (ref >60)
GFR calc non Af Amer: 46 mL/min — ABNORMAL LOW (ref >60)
Glucose, Bld: 121 mg/dL — ABNORMAL HIGH (ref 70–99)
Potassium: 2.9 mmol/L — ABNORMAL LOW (ref 3.5–5.1)
Sodium: 137 mmol/L (ref 135–145)
Total Bilirubin: 1.1 mg/dL (ref 0.3–1.2)
Total Protein: 8.6 g/dL — ABNORMAL HIGH (ref 6.5–8.1)

## 2019-11-17 LAB — TROPONIN I (HIGH SENSITIVITY)
Troponin I (High Sensitivity): 24 ng/L — ABNORMAL HIGH (ref <18)
Troponin I (High Sensitivity): 19 ng/L — ABNORMAL HIGH (ref <18)

## 2019-11-17 LAB — CBC WITH DIFFERENTIAL/PLATELET
Abs Immature Granulocytes: 0.03 10*3/uL (ref 0.00–0.07)
Basophils Absolute: 0.1 10*3/uL (ref 0.0–0.1)
Basophils Relative: 1 %
Eosinophils Absolute: 0 10*3/uL (ref 0.0–0.5)
Eosinophils Relative: 1 %
HCT: 33.1 % — ABNORMAL LOW (ref 39.0–52.0)
Hemoglobin: 9.5 g/dL — ABNORMAL LOW (ref 13.0–17.0)
Immature Granulocytes: 0 %
Lymphocytes Relative: 16 %
Lymphs Abs: 1.1 10*3/uL (ref 0.7–4.0)
MCH: 20.6 pg — ABNORMAL LOW (ref 26.0–34.0)
MCHC: 28.7 g/dL — ABNORMAL LOW (ref 30.0–36.0)
MCV: 71.8 fL — ABNORMAL LOW (ref 80.0–100.0)
Monocytes Absolute: 1.3 10*3/uL — ABNORMAL HIGH (ref 0.1–1.0)
Monocytes Relative: 19 %
Neutro Abs: 4.4 10*3/uL (ref 1.7–7.7)
Neutrophils Relative %: 63 %
Platelets: 195 10*3/uL (ref 150–400)
RBC: 4.61 MIL/uL (ref 4.22–5.81)
RDW: 23.8 % — ABNORMAL HIGH (ref 11.5–15.5)
WBC: 6.9 10*3/uL (ref 4.0–10.5)
nRBC: 0 % (ref 0.0–0.2)

## 2019-11-17 LAB — URINALYSIS, ROUTINE W REFLEX MICROSCOPIC
Bilirubin Urine: NEGATIVE
Glucose, UA: NEGATIVE mg/dL
Ketones, ur: NEGATIVE mg/dL
Leukocytes,Ua: NEGATIVE
Nitrite: NEGATIVE
Protein, ur: 30 mg/dL — AB
Specific Gravity, Urine: 1.008 (ref 1.005–1.030)
pH: 5 (ref 5.0–8.0)

## 2019-11-17 LAB — SARS CORONAVIRUS 2 BY RT PCR (HOSPITAL ORDER, PERFORMED IN ~~LOC~~ HOSPITAL LAB): SARS Coronavirus 2: NEGATIVE

## 2019-11-17 LAB — LACTIC ACID, PLASMA: Lactic Acid, Venous: 3.9 mmol/L (ref 0.5–1.9)

## 2019-11-17 LAB — ETHANOL: Alcohol, Ethyl (B): 215 mg/dL — ABNORMAL HIGH (ref <10)

## 2019-11-17 MED ORDER — THIAMINE HCL 100 MG/ML IJ SOLN: 100.0000 mg | Freq: Every day | INTRAMUSCULAR | Status: DC

## 2019-11-17 MED ORDER — POTASSIUM CHLORIDE 10 MEQ/100ML IV SOLN
10.0000 meq | Freq: Once | INTRAVENOUS | Status: AC
  Administered 2019-11-17: 10 meq via INTRAVENOUS
  Filled 2019-11-17: qty 100

## 2019-11-17 MED ORDER — SODIUM CHLORIDE 0.9 % IV BOLUS
2000.0000 mL | Freq: Once | INTRAVENOUS | Status: AC
  Administered 2019-11-17: 2000 mL via INTRAVENOUS

## 2019-11-17 MED ORDER — LORAZEPAM 2 MG/ML IJ SOLN: 0.0000 mg | Freq: Two times a day (BID) | INTRAMUSCULAR | Status: DC

## 2019-11-17 MED ORDER — LORAZEPAM 1 MG PO TABS: 0.0000 mg | ORAL_TABLET | Freq: Two times a day (BID) | ORAL | Status: DC

## 2019-11-17 MED ORDER — LORAZEPAM 2 MG/ML IJ SOLN
0.0000 mg | Freq: Four times a day (QID) | INTRAMUSCULAR | Status: DC
  Administered 2019-11-17: 2 mg via INTRAVENOUS
  Filled 2019-11-17: qty 1

## 2019-11-17 MED ORDER — THIAMINE HCL 100 MG PO TABS
100.0000 mg | ORAL_TABLET | Freq: Every day | ORAL | Status: DC
  Administered 2019-11-17 – 2019-11-19 (×3): 100 mg via ORAL
  Filled 2019-11-17 (×3): qty 1

## 2019-11-17 MED ORDER — POTASSIUM CHLORIDE CRYS ER 20 MEQ PO TBCR
40.0000 meq | EXTENDED_RELEASE_TABLET | Freq: Once | ORAL | Status: AC
  Administered 2019-11-17: 40 meq via ORAL
  Filled 2019-11-17: qty 2

## 2019-11-17 MED ORDER — SODIUM CHLORIDE 0.9 % IV BOLUS
1000.0000 mL | Freq: Once | INTRAVENOUS | Status: AC
  Administered 2019-11-17: 1000 mL via INTRAVENOUS

## 2019-11-17 MED ORDER — LORAZEPAM 1 MG PO TABS: 0.0000 mg | ORAL_TABLET | Freq: Four times a day (QID) | ORAL | Status: DC

## 2019-11-17 NOTE — ED Provider Notes (Signed)
[facility] EMERGENCY DEPARTMENT Provider Note   CSN: [id number] Arrival date & time: 11/17/19  1819     History Chief Complaint  Patient presents with  . Loss of Consciousness  . Altered Mental Status    Flora Ratz is a 69 y.o. male.  HPI    68 year old male with a history of alcohol withdrawal delirium, hypertension, hypokalemia, who presents to the emergency department today for evaluation of a syncopal episode.  Additional history obtained via EMS report.  They state the patient was at a restaurant prior to arrival and was sitting down when he suddenly had a syncopal episode that was witnessed by staff.  He reportedly did not have any seizure-like activity during this episode and upon awakening he seemed a bit confused and was only oriented to himself and place.  He did not have any urinary incontinence and EMS did not note any tongue biting.  Patient reports that he did not have any preceding symptoms prior to the syncopal episode.  He did not have any chest pain, palpitations, shortness of breath, nausea, diaphoresis or other complaints.  He states that this time he feels at his baseline.  He notes that the syncopal episode occurred while sitting and he did not fall out of his chair or sustain any head trauma or other injuries. He does note that he was feeling somewhat lightheaded and presyncopal yesterday so he spent the night at a friend's house as he was concerned that he might syncopized.  He no longer feels dizzy today.  States that he is supposed to be on multiple medications but they make him feel bad so he has not taken any of them in 3 weeks.  Admits to EtOH use about every other day and states last drink was 2 days ago.  Denies any drug use.  Complains that he is having hallucinations that started a few weeks ago.  States he sees faces on the wall.  Denies any auditory hallucinations.  Reviewed prior records.  Patient has been seen multiple times for  alcohol withdrawal symptoms.  There is a note from 04/23/2019 where he was complaining of visual hallucinations which was felt to be secondary to alcohol withdraw.  Symptoms sound similar to what he is complaining of today.  Past Medical History:  Diagnosis Date  . Alcohol withdrawal delirium ([place]) 02/14/2019  . Hypertension   . Hypokalemia 07/19/2017  . Malnutrition of moderate degree 10/24/2018    Patient Active Problem List   Diagnosis Date Noted  . Stable angina ([place]) 05/08/2019  . Alcoholic hepatitis   . Nosebleed 04/23/2019  . Alcohol withdrawal ([place]) 04/23/2019  . Anemia 02/26/2019  . Urinary retention 10/25/2018  . Alcohol use disorder, severe, in early remission ([place]) 10/23/2018  . Hepatic hemangioma 10/19/2018  . Visual hallucinations 06/26/2018  . Abnormal TSH 06/26/2018  . Need for hepatitis B vaccination 02/21/2018  . Increased anion gap metabolic acidosis 16/05/930  . Unintentional weight loss 02/16/2018  . Benign essential tremor 07/19/2017  . Elevated LFTs 05/16/2017  . ASCVD (arteriosclerotic cardiovascular disease) 05/16/2017  . Right carotid bruit 05/08/2017  . Essential hypertension 05/21/2015  . Healthcare maintenance 05/21/2015    Past Surgical History:  Procedure Laterality Date  . NO PAST SURGERIES         Family History  Problem Relation Age of Onset  . Hypertension Mother   . Hypertension Father   . Hypertension Sister   . Hypertension Sister   . Stroke Sister   .  Cancer Brother        throat    Social History   Tobacco Use  . Smoking status: Never Smoker  . Smokeless tobacco: Never Used  Vaping Use  . Vaping Use: Never used  Substance Use Topics  . Alcohol use: Yes    Alcohol/week: 4.0 standard drinks    Types: 4 Standard drinks or equivalent per week    Comment: occ  . Drug use: No    Home Medications Prior to Admission medications   Medication Sig Start Date End Date Taking? Authorizing Provider  amLODipine (NORVASC) 10  MG tablet Take 1 tablet (10 mg total) by mouth daily. 02/26/19   Neva Seat, MD  atorvastatin (LIPITOR) 40 MG tablet Take 1 tablet (40 mg total) by mouth daily. 07/03/18   Oval Linsey, MD  feeding supplement, ENSURE ENLIVE, (ENSURE ENLIVE) LIQD Take 237 mLs by mouth 2 (two) times daily between meals. 10/28/18   [name], MD  ferrous sulfate 325 (65 FE) MG tablet TAKE 1 TABLET BY MOUTH EVERY DAY 05/16/19   [name], MD  folic acid (FOLVITE) 1 MG tablet TAKE 1 TABLET BY MOUTH EVERY DAY 05/16/19   Agyei, Caprice Kluver, MD  lisinopril (ZESTRIL) 10 MG tablet Take 1 tablet (10 mg total) by mouth daily. 02/27/19 02/27/20  Neva Seat, MD  Multiple Vitamin (MULTIVITAMIN WITH MINERALS) TABS tablet Take 1 tablet by mouth every morning.    [provider]  naltrexone (DEPADE) 50 MG tablet Take 1 tablet by mouth once daily 02/19/19   Agyei, Caprice Kluver, MD  nitroGLYCERIN (NITROSTAT) 0.4 MG SL tablet Place 1 tablet (0.4 mg total) under the tongue every 5 (five) minutes as needed for chest pain. 05/08/19   [name], MD  tamsulosin (FLOMAX) 0.4 MG CAPS capsule Take 1 capsule by mouth once daily 02/19/19   [name], MD  thiamine 100 MG tablet Take 1 tablet (100 mg total) by mouth daily. 04/26/19   Jeanmarie Hubert, MD    Allergies    Patient has no known allergies.  Review of Systems   Review of Systems  Constitutional: Negative for fever.  HENT: Negative for sore throat.   Eyes: Negative for visual disturbance.  Respiratory: Negative for cough and shortness of breath.   Cardiovascular: Negative for chest pain.  Gastrointestinal: Negative for abdominal pain, constipation, diarrhea, nausea and vomiting.  Genitourinary: Negative for dysuria and hematuria.  Musculoskeletal: Negative for back pain.  Skin: Negative for rash.  Neurological: Positive for dizziness (improved) and syncope. Negative for seizures, weakness, light-headedness and numbness.  All other systems reviewed and  are negative.   Physical Exam Updated Vital Signs BP (!) 143/87   Pulse 89   Temp 98 F (36.7 C) (Oral)   Resp 17   SpO2 96%   Physical Exam Vitals and nursing note reviewed.  Constitutional:      Appearance: He is well-developed.  HENT:     Head: Normocephalic and atraumatic.     Mouth/Throat:     Mouth: Mucous membranes are dry.     Comments: No tongue bruising Eyes:     Conjunctiva/sclera: Conjunctivae normal.  Cardiovascular:     Rate and Rhythm: Normal rate and regular rhythm.     Heart sounds: Normal heart sounds. No murmur heard.   Pulmonary:     Effort: Pulmonary effort is normal. No respiratory distress.     Breath sounds: Normal breath sounds. No wheezing, rhonchi or rales.  Abdominal:  General: Bowel sounds are normal.     Palpations: Abdomen is soft.     Tenderness: There is no abdominal tenderness. There is no guarding or rebound.  Musculoskeletal:     Cervical back: Neck supple.  Skin:    General: Skin is warm and dry.  Neurological:     Mental Status: He is alert.     Comments: Mental Status:  Alert, thought content appropriate, able to give a coherent history. Oriented to self, place, situation, but not time. Speech fluent without evidence of aphasia. Able to follow 2 step commands without difficulty.  Cranial Nerves:  II:  pupils equal, round, reactive to light III,IV, VI: ptosis not present, extra-ocular motions intact bilaterally  V,VII: smile symmetric, facial light touch sensation equal VIII: hearing grossly normal to voice  X: uvula elevates symmetrically  XI: bilateral shoulder shrug symmetric and strong XII: midline tongue extension without fassiculations Motor:  Normal tone. 5/5 strength of BUE and BLE major muscle groups including strong and equal grip strength and dorsiflexion/plantar flexion Sensory: light touch normal in all extremities.      ED Results / Procedures / Treatments   Labs (all labs ordered are listed, but only  abnormal results are displayed) Labs Reviewed  CBC WITH DIFFERENTIAL/PLATELET - Abnormal; Notable for the following components:      Result Value   Hemoglobin 9.5 (*)    HCT 33.1 (*)    MCV 71.8 (*)    MCH 20.6 (*)    MCHC 28.7 (*)    RDW 23.8 (*)    Monocytes Absolute 1.3 (*)    All other components within normal limits  COMPREHENSIVE METABOLIC PANEL - Abnormal; Notable for the following components:   Potassium 2.9 (*)    CO2 14 (*)    Glucose, Bld 121 (*)    Creatinine, Ser 1.53 (*)    Total Protein 8.6 (*)    AST 105 (*)    ALT 59 (*)    GFR calc non Af Amer 46 (*)    GFR calc Af Amer 53 (*)    Anion gap 21 (*)    All other components within normal limits  ETHANOL - Abnormal; Notable for the following components:   Alcohol, Ethyl (B) 215 (*)    All other components within normal limits  URINALYSIS, ROUTINE W REFLEX MICROSCOPIC - Abnormal; Notable for the following components:   APPearance HAZY (*)    Hgb urine dipstick SMALL (*)    Protein, ur 30 (*)    Bacteria, UA FEW (*)    All other components within normal limits  MAGNESIUM - Abnormal; Notable for the following components:   Magnesium 1.6 (*)    All other components within normal limits  LACTIC ACID, PLASMA - Abnormal; Notable for the following components:   Lactic Acid, Venous 3.9 (*)    All other components within normal limits  TROPONIN I (HIGH SENSITIVITY) - Abnormal; Notable for the following components:   Troponin I (High Sensitivity) 24 (*)    All other components within normal limits  TROPONIN I (HIGH SENSITIVITY) - Abnormal; Notable for the following components:   Troponin I (High Sensitivity) 19 (*)    All other components within normal limits  SARS CORONAVIRUS 2 BY RT PCR (HOSPITAL ORDER, [place] [facility])  RAPID URINE DRUG SCREEN, HOSP PERFORMED  LACTIC ACID, PLASMA    EKG None  Radiology CT Head Wo Contrast  Result Date: 11/17/2019 CLINICAL DATA:  Syncope, hypertension  and alcohol withdrawal EXAM: CT HEAD WITHOUT CONTRAST TECHNIQUE: Contiguous axial images were obtained from the base of the skull through the vertex without intravenous contrast. COMPARISON:  CT 04/25/2017 FINDINGS: Brain: No evidence of acute infarction, hemorrhage, hydrocephalus, extra-axial collection or mass lesion/mass effect. Symmetric prominence of the ventricles, cisterns and sulci compatible with parenchymal volume loss. Patchy areas of white matter hypoattenuation are most compatible with chronic microvascular angiopathy. Vascular: Atherosclerotic calcification of the carotid siphons. No hyperdense vessel. Skull: No calvarial fracture or suspicious osseous lesion. No scalp swelling or hematoma. Sinuses/Orbits: Paranasal sinuses and mastoid air cells are predominantly clear. Pneumatization of the petrous apices. Included orbital structures are unremarkable. Other: None IMPRESSION: 1. No acute intracranial abnormality. 2. Chronic microvascular angiopathy and parenchymal volume loss. Electronically Signed   By: Lovena Le M.D.   On: 11/17/2019 19:15    Procedures Procedures (including critical care time)  Medications Ordered in ED Medications  LORazepam (ATIVAN) injection 0-4 mg (2 mg Intravenous Given 11/17/19 2316)    Or  LORazepam (ATIVAN) tablet 0-4 mg ( Oral See Alternative 11/17/19 2316)  LORazepam (ATIVAN) injection 0-4 mg (has no administration in time range)    Or  LORazepam (ATIVAN) tablet 0-4 mg (has no administration in time range)  thiamine tablet 100 mg (100 mg Oral Given 11/17/19 2002)    Or  thiamine (B-1) injection 100 mg ( Intravenous See Alternative 11/17/19 2002)  potassium chloride SA (KLOR-CON) CR tablet 40 mEq (40 mEq Oral Given 11/17/19 2039)  potassium chloride 10 mEq in 100 mL IVPB (0 mEq Intravenous Stopped 11/17/19 2159)  sodium chloride 0.9 % bolus 2,000 mL (0 mLs Intravenous Stopped 11/17/19 2303)  sodium chloride 0.9 % bolus 1,000 mL (1,000 mLs Intravenous New  Bag/Given 11/17/19 2316)    ED Course  I have reviewed the triage vital signs and the nursing notes.  Pertinent labs & imaging results that were available during my care of the patient were reviewed by me and considered in my medical decision making (see chart for details).    MDM Rules/Calculators/A&P                          70 year old male presenting for evaluation of loss of consciousness while at a restaurant prior to arrival.  Did not have any preceding signs or symptoms.  Reviewed/interpreted labs CBC without leukocytosis, anemia present but baseline for patient CMP with hypokalemia at 2.9, bicarb low at 14, elevated anion gap, AKI noted with creatinine at 1.5, LFTs elevated as well but improved from prior  - IVF given  - po and IV k given Trops elevated but downtrending, likely related to demand ETOH elevated >200 COVID neg Lactic acid elevated Mag pending upon admission UA pending on admission  EKG with NSR, left atrial enlargement, RSR' in V1/V2, LVH, borderline prolonged QT, no acute ischemic changes  CT head reviewed/interpreted - no acute changes  Pt given IVF and electrolyte replacement in the ED but will require admission for further tx of his lactic acidosis likely secondary to alcohol use  11:12 PM CONSULT With Dr. Charleen Kirks with internal medicine teaching service who accepts patient for admission.   Final Clinical Impression(s) / ED Diagnoses Final diagnoses:  Lactic acidosis    Rx / DC Orders ED Discharge Orders    None       Bishop Dublin 11/17/19 2331    Gareth Morgan, MD 11/18/19 2057

## 2019-11-17 NOTE — ED Triage Notes (Signed)
BIB [facility] after pt reported to have syncopal episode with + [place] at restaurant. Staff at [place] also reported to EMS that pt seemed confused. PT was A X O  X 2 (person and place) on ambulance. Upon arrival, PT A X O ( person, place, situation). B/P 154/92, 101, O2 100% on RA.

## 2019-11-17 NOTE — H&P (Signed)
Date: 11/18/2019               Patient Name:  Dan Henry MRN: [id number]  DOB: 06-26-1950 Age / Sex: 69 y.o., male   PCP: [name], MD         Medical Service: Internal Medicine Teaching Service         Attending Physician: Dr. Sid Falcon, MD    First Contact: Dr. Alfonse Spruce Pager: [phone]  Second Contact: Dr. Eileen Stanford Pager: [phone]       After Hours (After 5p/  First Contact Pager: [phone]  weekends / holidays): Second Contact Pager: [phone]   Chief Complaint: Syncope   History of Present Illness:  Dan Henry is a 69 y/o male with a PMHx of AUD with previous delirium tremens, alcoholic hepatitis, HTN, stable angina who presents to the ED with c/o syncope.   Dan Henry states that for the past two days he has not had much of an appetite and has had limited PO intake. Earlier today, he reports consuming two beers and then going to a restaurant in the afternoon where he had two additional beers. After that, all he can remember is "falling asleep" while seated at a table. Per EMS report, he had a syncopal episode witnessed by staff with no observed seizure-like activity, urinary incontinence or tongue biting. He denies prior history of syncope. He denies dizziness, chest pain, palpitations, SOB prior to the episode. He denies falling or hitting head. He otherwise denied any symptoms of fevers, chills, N/V/D, abdominal pain, leg swelling, generalized or focal weakness. He reports having chest pain with exertion that relieves with rest, however this has been a chronic problem for him. He feels today's episode was likely brought on by drinking beer. Dan Henry notes he usually had 2-4 beers per day and has been avoiding liquor. He denies any drug use.   ED Course:  Upon arrival to the ED, he was alert and oriented, hypertensive, tachycardic, afebrile and saturating well on room air. CBC remarkable for Ethanol level of 215; Hgb of 9.5 with MCV 71.8; CMP K 2.9, CO2 14, Anion  Gap 21, Cr 1.53, AST 105, ALT 59; Mg 1.6; lactic acid 3.9; Troponin 24. CT Head was unremarkable. He received thiamine, potassium repletion, and IVF. He was admitted to the Internal Medicine Teaching Service for further evaluation and treatment.  Meds:  Current Meds  Medication Sig  . feeding supplement, ENSURE ENLIVE, (ENSURE ENLIVE) LIQD Take 237 mLs by mouth 2 (two) times daily between meals. (Patient taking differently: Take 237 mLs by mouth 3 (three) times daily between meals. )  . ferrous sulfate 325 (65 FE) MG tablet TAKE 1 TABLET BY MOUTH EVERY DAY (Patient taking differently: Take 325 mg by mouth daily. )  . lisinopril (ZESTRIL) 10 MG tablet Take 1 tablet (10 mg total) by mouth daily.  . Multiple Vitamin (MULTIVITAMIN WITH MINERALS) TABS tablet Take 1 tablet by mouth every morning.  . nitroGLYCERIN (NITROSTAT) 0.4 MG SL tablet Place 1 tablet (0.4 mg total) under the tongue every 5 (five) minutes as needed for chest pain.  Marland Kitchen thiamine 100 MG tablet Take 1 tablet (100 mg total) by mouth daily.   Allergies: Allergies as of 11/17/2019  . (No Known Allergies)   Past Medical History:  Diagnosis Date  . Alcohol withdrawal delirium ([place]) 02/14/2019  . Hypertension   . Hypokalemia 07/19/2017  . Malnutrition of moderate degree 10/24/2018   Family History:  Family History  Problem Relation Age of Onset  . Hypertension Mother   . Hypertension Father   . Hypertension Sister   . Hypertension Sister   . Stroke Sister   . Cancer Brother        throat   Social History:  Alcohol: Chronic alcohol use, with most recent quantities equalling 2-4 beers per day Tobacco: Denies Drug: Denies   Review of Systems: A complete ROS was negative except as per HPI.   Physical Exam: Blood pressure 137/79, pulse 83, temperature 98 F (36.7 C), temperature source Oral, resp. rate 16, SpO2 98 %. Physical Exam Constitutional:      General: He is not in acute distress.    Appearance: He is not  ill-appearing.  HENT:     Head: Normocephalic and atraumatic.     Mouth/Throat:     Mouth: Mucous membranes are dry.     Pharynx: Oropharynx is clear.  Eyes:     Extraocular Movements: Extraocular movements intact.     Conjunctiva/sclera: Conjunctivae normal.  Cardiovascular:     Rate and Rhythm: Normal rate and regular rhythm.     Pulses: Normal pulses.     Heart sounds: Normal heart sounds.  Pulmonary:     Effort: Pulmonary effort is normal.     Breath sounds: Normal breath sounds.  Abdominal:     General: Abdomen is flat. Bowel sounds are normal.     Palpations: Abdomen is soft.  Musculoskeletal:        General: Normal range of motion.     Cervical back: Normal range of motion and neck supple.  Skin:    General: Skin is warm and dry.  Neurological:     General: No focal deficit present.     Mental Status: He is alert and oriented to person, place, and time. Mental status is at baseline.    EKG: Sinus rhythm; Probable left atrial enlargement; RSR' in V1 or V2, probably normal variant; Left ventricular hypertrophy; Borderline prolonged QT interval  CT HEAD WO CONTRAST: No acute intracranial abnormality. Chronic microvascular angiopathy and parenchymal volume loss.  Assessment & Plan by Problem: Dan Henry is a 69 y/o male with a PMHx of AUD with previous delirium tremens, alcoholic hepatitis, HTN, stable angina who presents to the ED with c/o syncope found to have anion gap metabolic acidosis with electrolyte derangements. Active Problems:   Syncope  #Syncope Upon arrival to the ED, patient's EKG did not show an acute arrhythmia, however it is possible that he had an arrhythmia at the time of this episode. Though, patient has no prior history of cardiac arrhythmia. Overall metabolic derangements may have contributed to his episode. No seizure like activity observed by restaurant staff. Although patient had positive orthostatics in the ED, he was seated during the event  so unlikely to have contributed to this episode. Evidence of acute intoxication given ethanol level of 215. Therefore, the underlying etiology is likely multifactorial. PLAN: -Telemetry overnight -Repeat CMP in AM  #Anion gap metabolic acidosis Low bicarb level with elevated anion gap in the setting of an elevated lactic acid. AGMA likely secondary to Type B lactic acidosis given his elevated ethanol level. PLAN: -Repeat lactic acid level in AM -Repeat CMP in AM  #Alcohol Use Disorder Patient endorses consumption of 2-4 beers daily, however he has recent history of hospitalization for delirium tremens. PLAN: -CIWA Protocol -Dose ativan based on CIWA as needed. -Thiamine -Folic Acid  #Hypokalemia #Hypomagnesemia Potassium 2.9 and magnesium 1.6 on arrival. Likely  from dietary losses in combination of chronic alcohol use. Has received 51mEq of K and ordered 2g of Magnesium Sulfate. PLAN: -Continue to replete electrolytes -Repeat CMP in AM -Repeat Mg in AM  #Acute kidney injury Baseline creatinine of 0.91 (06/10/19), creatinine today of 1.53. Likely due to decreased oral intake for past few days. PLAN: -IVF -Repeat CMP in AM  Alcoholic hepatitis Improving based on prior HFP. PLAN: -Repeat CMP in AM  #Microcytic anemia, chronic Likely iron deficiency anemia, Iron of 14 in December. Patient has home prescription for ferrous sulfate 325. PLAN: -CBC  #HTN PLAN: -Holding home lisinopril  Code status: FULL DVT ppx: Lovenox 40mg  injection daily Diet: Heart healthy diet  Dispo: Admit patient to Inpatient with expected length of stay greater than 2 midnights.  Signed: Paulla Dolly, MD 11/18/2019, 3:19 AM  Pager: [phone] After 5pm on weekdays and 1pm on weekends: On Call pager: [phone]

## 2019-11-17 NOTE — ED Notes (Signed)
Transported to CT 

## 2019-11-18 DIAGNOSIS — E872 Acidosis: Secondary | ICD-10-CM | POA: Diagnosis not present

## 2019-11-18 DIAGNOSIS — F10929 Alcohol use, unspecified with intoxication, unspecified: Secondary | ICD-10-CM

## 2019-11-18 DIAGNOSIS — R55 Syncope and collapse: Secondary | ICD-10-CM | POA: Diagnosis present

## 2019-11-18 HISTORY — DX: Alcohol use, unspecified with intoxication, unspecified: F10.929

## 2019-11-18 LAB — COMPREHENSIVE METABOLIC PANEL
ALT: 44 U/L (ref 0–44)
AST: 81 U/L — ABNORMAL HIGH (ref 15–41)
Albumin: 3.5 g/dL (ref 3.5–5.0)
Alkaline Phosphatase: 33 U/L — ABNORMAL LOW (ref 38–126)
Anion gap: 10 (ref 5–15)
BUN: 9 mg/dL (ref 8–23)
CO2: 19 mmol/L — ABNORMAL LOW (ref 22–32)
Calcium: 7.6 mg/dL — ABNORMAL LOW (ref 8.9–10.3)
Chloride: 114 mmol/L — ABNORMAL HIGH (ref 98–111)
Creatinine, Ser: 0.99 mg/dL (ref 0.61–1.24)
GFR calc Af Amer: >60 mL/min (ref >60)
GFR calc non Af Amer: >60 mL/min (ref >60)
Glucose, Bld: 83 mg/dL (ref 70–99)
Potassium: 3.9 mmol/L (ref 3.5–5.1)
Sodium: 143 mmol/L (ref 135–145)
Total Bilirubin: 0.8 mg/dL (ref 0.3–1.2)
Total Protein: 6.5 g/dL (ref 6.5–8.1)

## 2019-11-18 LAB — CBC
HCT: 27.8 % — ABNORMAL LOW (ref 39.0–52.0)
Hemoglobin: 8.2 g/dL — ABNORMAL LOW (ref 13.0–17.0)
MCH: 20.9 pg — ABNORMAL LOW (ref 26.0–34.0)
MCHC: 29.5 g/dL — ABNORMAL LOW (ref 30.0–36.0)
MCV: 70.9 fL — ABNORMAL LOW (ref 80.0–100.0)
Platelets: 157 10*3/uL (ref 150–400)
RBC: 3.92 MIL/uL — ABNORMAL LOW (ref 4.22–5.81)
RDW: 23.5 % — ABNORMAL HIGH (ref 11.5–15.5)
WBC: 4 10*3/uL (ref 4.0–10.5)
nRBC: 0 % (ref 0.0–0.2)

## 2019-11-18 LAB — LACTIC ACID, PLASMA
Lactic Acid, Venous: 2.6 mmol/L (ref 0.5–1.9)
Lactic Acid, Venous: 2.4 mmol/L (ref 0.5–1.9)

## 2019-11-18 LAB — PHOSPHORUS: Phosphorus: 2.8 mg/dL (ref 2.5–4.6)

## 2019-11-18 LAB — HIV ANTIBODY (ROUTINE TESTING W REFLEX): HIV Screen 4th Generation wRfx: NONREACTIVE

## 2019-11-18 LAB — MAGNESIUM: Magnesium: 1.6 mg/dL — ABNORMAL LOW (ref 1.7–2.4)

## 2019-11-18 MED ORDER — AMLODIPINE BESYLATE 10 MG PO TABS
10.0000 mg | ORAL_TABLET | Freq: Every day | ORAL | Status: DC
  Administered 2019-11-18 – 2019-11-19 (×2): 10 mg via ORAL
  Filled 2019-11-18: qty 2
  Filled 2019-11-18: qty 1

## 2019-11-18 MED ORDER — ADULT MULTIVITAMIN W/MINERALS CH
1.0000 | ORAL_TABLET | Freq: Every day | ORAL | Status: DC
  Administered 2019-11-18 – 2019-11-19 (×2): 1 via ORAL
  Filled 2019-11-18 (×2): qty 1

## 2019-11-18 MED ORDER — FOLIC ACID 1 MG PO TABS
1.0000 mg | ORAL_TABLET | Freq: Every day | ORAL | Status: DC
  Administered 2019-11-18 – 2019-11-19 (×2): 1 mg via ORAL
  Filled 2019-11-18 (×2): qty 1

## 2019-11-18 MED ORDER — LORAZEPAM 1 MG PO TABS
1.0000 mg | ORAL_TABLET | ORAL | Status: DC | PRN
  Administered 2019-11-18: 1 mg via ORAL
  Filled 2019-11-18: qty 1

## 2019-11-18 MED ORDER — LORAZEPAM 2 MG/ML IJ SOLN
1.0000 mg | INTRAMUSCULAR | Status: DC | PRN
  Administered 2019-11-18: 2 mg via INTRAVENOUS
  Filled 2019-11-18: qty 1

## 2019-11-18 MED ORDER — CHLORDIAZEPOXIDE HCL 5 MG PO CAPS: 25.0000 mg | ORAL_CAPSULE | Freq: Three times a day (TID) | ORAL | Status: DC

## 2019-11-18 MED ORDER — METOPROLOL TARTRATE 25 MG PO TABS
25.0000 mg | ORAL_TABLET | Freq: Two times a day (BID) | ORAL | Status: DC
  Administered 2019-11-18 – 2019-11-19 (×3): 25 mg via ORAL
  Filled 2019-11-18 (×3): qty 1

## 2019-11-18 MED ORDER — THIAMINE HCL 100 MG PO TABS: 100.0000 mg | ORAL_TABLET | Freq: Every day | ORAL | Status: DC

## 2019-11-18 MED ORDER — SODIUM CHLORIDE 0.9 % IV SOLN
510.0000 mg | Freq: Once | INTRAVENOUS | Status: AC
  Administered 2019-11-18: 510 mg via INTRAVENOUS
  Filled 2019-11-18: qty 17

## 2019-11-18 MED ORDER — LACTATED RINGERS IV SOLN: INTRAVENOUS | Status: AC

## 2019-11-18 MED ORDER — MAGNESIUM SULFATE 2 GM/50ML IV SOLN
2.0000 g | Freq: Once | INTRAVENOUS | Status: AC
  Administered 2019-11-18: 2 g via INTRAVENOUS
  Filled 2019-11-18: qty 50

## 2019-11-18 MED ORDER — ASPIRIN EC 81 MG PO TBEC
81.0000 mg | DELAYED_RELEASE_TABLET | Freq: Every day | ORAL | Status: DC
  Administered 2019-11-18 – 2019-11-19 (×2): 81 mg via ORAL
  Filled 2019-11-18 (×2): qty 1

## 2019-11-18 MED ORDER — CHLORDIAZEPOXIDE HCL 25 MG PO CAPS
25.0000 mg | ORAL_CAPSULE | Freq: Once | ORAL | Status: AC
  Administered 2019-11-18: 25 mg via ORAL
  Filled 2019-11-18: qty 1

## 2019-11-18 MED ORDER — CHLORDIAZEPOXIDE HCL 5 MG PO CAPS: 25.0000 mg | ORAL_CAPSULE | ORAL | Status: DC

## 2019-11-18 MED ORDER — LOPERAMIDE HCL 2 MG PO CAPS: 2.0000 mg | ORAL_CAPSULE | ORAL | Status: DC | PRN

## 2019-11-18 MED ORDER — ENOXAPARIN SODIUM 40 MG/0.4ML ~~LOC~~ SOLN
40.0000 mg | SUBCUTANEOUS | Status: DC
  Administered 2019-11-18 – 2019-11-19 (×2): 40 mg via SUBCUTANEOUS
  Filled 2019-11-18 (×2): qty 0.4

## 2019-11-18 MED ORDER — CHLORDIAZEPOXIDE HCL 5 MG PO CAPS: 25.0000 mg | ORAL_CAPSULE | Freq: Every day | ORAL | Status: DC

## 2019-11-18 MED ORDER — CHLORDIAZEPOXIDE HCL 5 MG PO CAPS
25.0000 mg | ORAL_CAPSULE | Freq: Four times a day (QID) | ORAL | Status: AC
  Administered 2019-11-18 – 2019-11-19 (×4): 25 mg via ORAL
  Filled 2019-11-18 (×3): qty 5
  Filled 2019-11-18: qty 1

## 2019-11-18 MED ORDER — CHLORDIAZEPOXIDE HCL 25 MG PO CAPS: 25.0000 mg | ORAL_CAPSULE | ORAL | 0 refills | Status: DC

## 2019-11-18 MED ORDER — ONDANSETRON 4 MG PO TBDP: 4.0000 mg | ORAL_TABLET | Freq: Four times a day (QID) | ORAL | Status: DC | PRN

## 2019-11-18 MED ORDER — METOPROLOL TARTRATE 25 MG PO TABS
25.0000 mg | ORAL_TABLET | Freq: Two times a day (BID) | ORAL | 1 refills | Status: DC
Start: 2019-11-18 — End: 2020-06-10

## 2019-11-18 MED ORDER — FERROUS SULFATE 325 (65 FE) MG PO TABS
325.0000 mg | ORAL_TABLET | Freq: Every day | ORAL | Status: DC
  Administered 2019-11-18 – 2019-11-19 (×2): 325 mg via ORAL
  Filled 2019-11-18 (×2): qty 1

## 2019-11-18 NOTE — ED Notes (Signed)
Lunch Trays Ordered @ 1101. 

## 2019-11-18 NOTE — ED Notes (Addendum)
Attempted to walk pt; pt again became tachycardic when getting out of bed, HR 130; pt tremorous, very unsteady of feet; pt helped back to bed; another CIWA done; Called and spoke with Dr. Eileen Stanford regarding unsteadiness, tachycardia, recent CIWA; Per Dr. Eileen Stanford, who is very familiar with pt, pt close to baseline; verbal order to observe for several more hours; Dr. Eileen Stanford to order 1 dose Librium; pt Dr. Eileen Stanford, no additional ativan to be given at this time

## 2019-11-18 NOTE — ED Notes (Signed)
Dinner tray at bedside

## 2019-11-18 NOTE — ED Notes (Signed)
Pt's niece, [name], updated

## 2019-11-18 NOTE — ED Notes (Signed)
Pt noted to be tachycardic 170s; pt up out of bed trying to use urinal, extremely tremerous; pt assisted to bedside commode; CIWA redone by Rolene Arbour, RN; pt finished on commode, assisted back to bed; additional 2 mg ativan given IV; Dr. Bridgett Larsson and Dr. Sharon Seller notified of tachycardia, HTN, CIWA and ativan adminstration

## 2019-11-18 NOTE — ED Notes (Signed)
Pt's niece  [name]) updated

## 2019-11-18 NOTE — ED Notes (Signed)
Attempted to ambulate pt again; pt still unsteady on feet, although improvement noted from earlier; some tremor still noted; pt again became tachycardia while ambulating (140); witnessed by Dr. Eileen Stanford, who is at bedside; CIWA done again; pt very lethargic, no ativan given at this time (Discussed with Dr. Eileen Stanford)

## 2019-11-18 NOTE — ED Notes (Signed)
Attempted report 

## 2019-11-18 NOTE — Progress Notes (Signed)
Pt arrived to 2W at shift change. NT took pt's vital signs. RN introduced pt to [employer] (night shift RN) and gave him report about pt.

## 2019-11-18 NOTE — Progress Notes (Addendum)
° °  Subjective: HD#0   Overnight: Admitted   Today, Dan Henry states he is doing well and denies chest pain, shortness of breath, headache, dizziness.  I informed him that his blood alcohol level was extremely high when he came in despite telling our admitting team he only drinks 2-4 beers per day.  After much discussion, he stated that he has the willpower to discontinue drinking.  Objective:  Vital signs in last 24 hours: Vitals:   11/18/19 1028 11/18/19 1101 11/18/19 1118 11/18/19 1119  BP: (!) 163/86 138/75 138/75   Pulse: (!) 106 98 (!) 107 97  Resp: (!) 22 20  19   Temp:      TempSrc:      SpO2: 97% 96%  99%   Const: In no apparent distress, lying comfortably in bed, conversational Resp: CTA BL anteriorly CV: Tachycardic to the low 100s. Abd: Bowel sounds present, nontender to palpation but was guarding his abdomen mildly distended Ext: No lower extremity edema, skin is warm to touch, chronic upper extremity tremors   Assessment/Plan:  Principal Problem:   Alcohol intoxication ([place]) Active Problems:   Essential hypertension   Benign essential tremor   Increased anion gap metabolic acidosis   Alcoholic hepatitis   Syncope  Dan Henry is a 69 year old African-American gentleman with alcohol use disorder, alcoholic hepatitis, hypertension, stable angina who is here for management of alcohol intoxication.  #Acute alcohol intoxication #Elevated anion gap metabolic acidosis 2/2 EtOH use Noted to have a blood alcohol level of 215 mg/dL.  This is unfortunately recurring issue and there have been multiple conversations with him regarding alcohol cessation.  At some point, we had even discussed starting naltrexone.  Today during my conversation, he made me aware that he has the willpower to discontinue alcohol use.  He is amenable to discharge today.  Currently, there is no signs or symptoms of acute alcohol withdrawal or delirium tremens however he remains tachycardic and  tremelous  -CIWA with Ativan -Librium Taper -TOC assistance for community resources for substance abuse -Follow-up with internal medicine clinic after discharge and also to continue follow-up with chronic care management -Will admit for observation overnight    #Positive orthostatics: Presented after questionable syncopal episode however he states that he just fell asleep while having dinner.  He has received 3 L of normal saline and currently denies dizziness or lightheadedness.   #Acute kidney injury: Improved with IV fluids   #Hypertension-uncontrolled: Suspected medication nonadherence -Continue amlodipine    #Benign essential tremors: Decrease metoprolol from 50 mg twice daily to 25mg  twice a day.   #Alcoholic hepatitis -Advised on alcohol cessation -Continued conversation about starting Naltrexone   #Chronic microcytic anemia -Continue ferrous sulfate    FEN: Heart healthy VTE ppx: Subcutaneous Lovenox CODE STATUS: Full code  Prior to Admission Living Arrangement: Home Anticipated Discharge Location: Home Barriers to Discharge: Stable to discharge on Librium taper Dispo: Anticipated discharge in approximately today   [name], MD 11/18/2019, 11:30 AM Pager: [phone] Internal Medicine Teaching Service After 5pm on weekdays and 1pm on weekends: On Call pager: [phone]

## 2019-11-18 NOTE — ED Notes (Signed)
Dr. Eileen Stanford at bedside

## 2019-11-18 NOTE — Discharge Summary (Signed)
Name: Dan Henry MRN: [id number] DOB: 12-25-1950 69 y.o. PCP: [name], MD  Date of Admission: 11/17/2019  6:19 PM Date of Discharge: 11/19/2019 Attending Physician: Dr. Daryll Drown   Discharge Diagnosis: 1.  Acute alcohol intoxication, elevated anion gap metabolic acidosis secondary to alcohol use 2.  Positive orthostatics 3.  Acute kidney injury 4.  Uncontrolled hypertension 5.  Benign essential tremors 6.  Alcoholic hepatitis 7.  Chronic microcytic anemia  Discharge Medications: Allergies as of 11/19/2019   No Known Allergies     Medication List    TAKE these medications   amLODipine 10 MG tablet Commonly known as: NORVASC Take 1 tablet (10 mg total) by mouth daily.   atorvastatin 40 MG tablet Commonly known as: LIPITOR Take 1 tablet (40 mg total) by mouth daily.   chlordiazePOXIDE 25 MG capsule Commonly known as: LIBRIUM Take 1 capsule (25 mg total) by mouth 3 (three) times daily. Take 1 capsule (25 mg total) by mouth 3 (three) times daily on 11/19/2019   chlordiazePOXIDE 25 MG capsule Commonly known as: LIBRIUM Take 1 capsule (25 mg total) by mouth 2 (two) times daily in the am and at bedtime.. Take 1 capsule (25mg  total) by mouth 2 (two) times daily in the morning and at bedtime on 11/20/2019.   chlordiazePOXIDE 25 MG capsule Commonly known as: LIBRIUM Take 1 capsule (25 mg total) by mouth daily. Take 1 capsule (25mg  total) by mouth daily for 11/21/2019 Start taking on: November 21, 2019   feeding supplement (ENSURE ENLIVE) Liqd Take 237 mLs by mouth 2 (two) times daily between meals. What changed: when to take this   ferrous sulfate 325 (65 FE) MG tablet TAKE 1 TABLET BY MOUTH EVERY DAY   folic acid 1 MG tablet Commonly known as: FOLVITE TAKE 1 TABLET BY MOUTH EVERY DAY   lisinopril 10 MG tablet Commonly known as: ZESTRIL Take 1 tablet (10 mg total) by mouth daily.   metoprolol tartrate 25 MG tablet Commonly known as: LOPRESSOR Take 1 tablet (25 mg  total) by mouth 2 (two) times daily.   multivitamin with minerals Tabs tablet Take 1 tablet by mouth every morning.   naltrexone 50 MG tablet Commonly known as: DEPADE Take 1 tablet by mouth once daily   nitroGLYCERIN 0.4 MG SL tablet Commonly known as: NITROSTAT Place 1 tablet (0.4 mg total) under the tongue every 5 (five) minutes as needed for chest pain.   tamsulosin 0.4 MG Caps capsule Commonly known as: FLOMAX Take 1 capsule by mouth once daily   thiamine 100 MG tablet Take 1 tablet (100 mg total) by mouth daily.       Disposition and follow-up:   [name] was discharged from [facility] in Stable condition.  At the hospital follow up visit please address:  1.  Acute alcohol intoxication, elevated anion gap metabolic acidosis secondary to alcohol use: Encouraged alcohol cessation, initiation of naltrexone 2.  Positive orthostatics: Encourage oral hydration 3.  Uncontrolled hypertension: Encourage medication compliance  2.  Labs / imaging needed at time of follow-up: BMP  3.  Pending labs/ test needing follow-up: None  Follow-up Appointments:  Follow-up Information    [name], MD. Call in 1 week(s).   Specialty: Internal Medicine Why: Call to confirm appointment scheduled for this Friday 11/22/2019 at 1:45 pm with Dr. Norm Salt information: [address] [place] [id number] [phone]        Jerline Pain, MD .  Specialty: Cardiology Contact information: [address] [address] [place] by problem list: 1.  Acute alcohol intoxication, elevated anion gap metabolic acidosis secondary to alcohol use: [name] is a 68 year old African-American gentleman with medical history significant for alcohol use disorder, alcoholic hepatitis, hypertension, stable angina, benign essential tremors who presented to the hospital with alcohol  intoxication.  On admission, his blood alcohol level was 215.  He received IV fluids, Librium and was started on CIWA protocol with Ativan.  He was subsequently discharged on Librium taper  2.  Positive orthostatics: This was secondary to decreased oral intake.  He received IV fluids.  3.  Acute kidney injury: Prerenal azotemia, Improved with IV fluids  4.  Uncontrolled hypertension: Slowly started home antihypertensives and received amlodipine during this admission.  He was discharged to resume all his home antihypertensives  5.  Benign essential tremors: Decreased metoprolol dose from 50mg  BID to 25mg  BID  6.  Alcoholic hepatitis: Advised on alcohol cessation.   7.  Chronic microcytic anemia: Continued ferrous sulfate   Discharge Vitals:   BP 122/72 (BP Location: Left Arm)   Pulse 69   Temp 98.7 F (37.1 C) (Oral)   Resp 18   SpO2 98%   Pertinent Labs, Studies, and Procedures:  CT Head Wo Contrast  Result Date: 11/17/2019 CLINICAL DATA:  Syncope, hypertension and alcohol withdrawal EXAM: CT HEAD WITHOUT CONTRAST TECHNIQUE: Contiguous axial images were obtained from the base of the skull through the vertex without intravenous contrast. COMPARISON:  CT 04/25/2017 FINDINGS: Brain: No evidence of acute infarction, hemorrhage, hydrocephalus, extra-axial collection or mass lesion/mass effect. Symmetric prominence of the ventricles, cisterns and sulci compatible with parenchymal volume loss. Patchy areas of white matter hypoattenuation are most compatible with chronic microvascular angiopathy. Vascular: Atherosclerotic calcification of the carotid siphons. No hyperdense vessel. Skull: No calvarial fracture or suspicious osseous lesion. No scalp swelling or hematoma. Sinuses/Orbits: Paranasal sinuses and mastoid air cells are predominantly clear. Pneumatization of the petrous apices. Included orbital structures are unremarkable. Other: None IMPRESSION: 1. No acute intracranial abnormality. 2.  Chronic microvascular angiopathy and parenchymal volume loss. Electronically Signed   By: Lovena Le M.D.   On: 11/17/2019 19:15   Discharge Instructions: Discharge Instructions    Diet - low sodium heart healthy   Complete by: As directed    Discharge instructions   Complete by: As directed    [name],   It was a pleasure taking care of you here in the hospital.   I have also made an appointment for you to see [place] in the clinic.  Please take all your blood pressure medicines as well.  Take Care.   Increase activity slowly   Complete by: As directed    Increase activity slowly   Complete by: As directed       Signed: [name], MD 11/20/2019, 11:24 AM   Pager: [phone] Internal Medicine Teaching Service

## 2019-11-18 NOTE — ED Notes (Signed)
Dr. Daryll Drown and team at bedside

## 2019-11-19 DIAGNOSIS — E872 Acidosis: Secondary | ICD-10-CM | POA: Diagnosis not present

## 2019-11-19 LAB — LACTIC ACID, PLASMA
Lactic Acid, Venous: 1 mmol/L (ref 0.5–1.9)
Lactic Acid, Venous: 1 mmol/L (ref 0.5–1.9)

## 2019-11-19 MED ORDER — MAGNESIUM SULFATE 2 GM/50ML IV SOLN
2.0000 g | Freq: Once | INTRAVENOUS | Status: DC
  Filled 2019-11-19: qty 50

## 2019-11-19 MED ORDER — CHLORDIAZEPOXIDE HCL 25 MG PO CAPS: 25.0000 mg | ORAL_CAPSULE | Freq: Three times a day (TID) | ORAL | 0 refills | Status: DC

## 2019-11-19 MED ORDER — CHLORDIAZEPOXIDE HCL 25 MG PO CAPS: 25.0000 mg | ORAL_CAPSULE | Freq: Every day | ORAL | 0 refills | Status: DC

## 2019-11-19 MED ORDER — MAGNESIUM SULFATE 2 GM/50ML IV SOLN
2.0000 g | Freq: Once | INTRAVENOUS | Status: AC
  Administered 2019-11-19: 2 g via INTRAVENOUS
  Filled 2019-11-19: qty 50

## 2019-11-19 MED ORDER — KCL-LACTATED RINGERS 20 MEQ/L IV SOLN
INTRAVENOUS | Status: DC
  Filled 2019-11-19: qty 1000

## 2019-11-19 MED ORDER — POTASSIUM CHLORIDE 2 MEQ/ML IV SOLN
INTRAVENOUS | Status: AC
  Filled 2019-11-19 (×2): qty 1000

## 2019-11-19 MED ORDER — CHLORDIAZEPOXIDE HCL 25 MG PO CAPS: 25.0000 mg | ORAL_CAPSULE | ORAL | 0 refills | Status: DC

## 2019-11-19 NOTE — Progress Notes (Signed)
Pt and RN waiting for taxi to arrive for dc due to patient's niece not being able to pick pt up for dc. Taxi company called nurses' station to let us know they were here. Pt no longer in room and did not take taxi voucher.  RN attempted to call patient's cell but niece answered and aware that patient was no longer in his room waiting for the taxi.  RN also called security asking them to help look for patient so he could get his taxi voucher, security said they were familiar with the patient and would look for him.

## 2019-11-19 NOTE — Progress Notes (Signed)
° °  Subjective:   Overnight Events: None  Dan Henry was seen at bedside this morning. Patient states that he feels better today compared to yesterday. He denies headaches, lightheadedness, nausea, vomiting, palpitations, diaphoresis, or abdominal pain.   Objective: Vital signs in last 24 hours: Vitals:   11/18/19 1527 11/18/19 1820 11/18/19 1911 11/18/19 2233  BP:  139/90 (!) 150/83 (!) 160/83  Pulse: 98 85 83 89  Resp:  16    Temp:  99.2 F (37.3 C)  99.1 F (37.3 C)  TempSrc:  Oral  Oral  SpO2:  97%  98%   Physical Exam Constitutional:      General: He is not in acute distress.    Appearance: Normal appearance. He is not diaphoretic.     Comments: Sitting comfortably in bed, non diaphoretic, conversant, able to answer questions appropriately.   HENT:     Head: Normocephalic.  Cardiovascular:     Rate and Rhythm: Normal rate and regular rhythm.     Pulses: Normal pulses.     Heart sounds: Normal heart sounds. No murmur heard.  No friction rub. No gallop.   Pulmonary:     Effort: Pulmonary effort is normal.     Breath sounds: Normal breath sounds. No wheezing, rhonchi or rales.  Abdominal:     General: Abdomen is flat. Bowel sounds are normal.     Palpations: Abdomen is soft.     Tenderness: There is no abdominal tenderness.  Musculoskeletal:        General: No swelling or tenderness.     Comments: No hand tremors noted on physical examination.   Skin:    General: Skin is warm and dry.     Coloration: Skin is not jaundiced.  Neurological:     Mental Status: He is alert.    Assessment/Plan:  Principal Problem:   Alcohol intoxication ([place]) Active Problems:   Essential hypertension   Benign essential tremor   Lactic acidosis   Alcoholic hepatitis   Syncope  Dan Henry is a 69 year old African-American gentleman with alcohol use disorder, alcoholic hepatitis, hypertension, stable angina who is here for management of alcohol intoxication.  Acute alcohol  intoxication Elevated anion gap metabolic acidosis 2/2 EtOH use Noted to have a blood alcohol level of 215 mg/dL. O/N CIWA scores of 0-6 (Tremor 5). Lactic acidosis has resolved.  -CIWA with Ativan -Librium Taper -TOC assistance for community resources for substance abuse -Follow-up with internal medicine clinic after discharge and also to continue follow-up with chronic care management - LA: 1.0  Positive orthostatics:  Presented after questionable syncopal episode however he states that he just fell asleep while having dinner.  He has received 3 L of normal saline and currently denies dizziness or lightheadedness.  Acute kidney injury: Improved with IV fluids  Hypertension-uncontrolled: Suspected medication nonadherence -Continue amlodipine   Benign essential tremors: Decrease metoprolol from 50 mg twice daily to 25mg  twice a day.  Alcoholic hepatitis -Advised on alcohol cessation -Continued conversation about starting Naltrexone  Chronic microcytic anemia -Continue ferrous sulfate    FEN: Heart healthy VTE ppx: Subcutaneous Lovenox CODE STATUS: Full code  Prior to Admission Living Arrangement: Home Anticipated Discharge Location: Home Barriers to Discharge: Stable to discharge on Librium taper Dispo: Anticipated discharge in approximately today   Maudie Mercury, MD 11/19/2019, 7:05 AM Pager: [phone] Internal Medicine Teaching Service

## 2019-11-19 NOTE — Care Management Obs Status (Signed)
[facility] NOTIFICATION   Patient Details  Name: Dan Henry MRN: [id number] Date of Birth: [birth year]   Medicare Observation Status Notification Given:  Yes    Angelita Ingles, RN 11/19/2019, 9:39 AM

## 2019-11-19 NOTE — Progress Notes (Signed)
Ambulated pt in room and into hallway a short distance. Pt tolerated well, steady slow gait.  Did have pt to use walker for safety, pt states he uses a cane at times at home.   Niece, [name] had called earlier she expressed concern about how pt's heart rate was doing when he moved around.  She stated the ER staff had told her yesterday that his heart rate would get high every time he stood up/moved around, there fore he was admitted to hospital..   Pt walked with nurse, HR remained 87-95 during walk, with starting at 93 up to 95, then back to 87-88.Marland Kitchen   Pt tolerated well. No pain, no SOB.   Will inform Dr Gilford Rile, and inquire if he has had a chance to call pt's niece. She waiting for call.

## 2019-11-19 NOTE — Progress Notes (Signed)
  Date: 11/19/2019  Patient name: Dan Henry  Medical record number: [id number]  Date of birth: December 23, 1950        I have seen and evaluated this patient and I have discussed the plan of care with the house staff. Please see Dr. Gilford Rile' note for complete details. I concur with his findings and plan.  Patient much improved today.  Can discharge with librium taper to avoid severe withdrawal.  Counseled on ETOH cessation on admission.   Sid Falcon, MD 11/19/2019, 4:58 PM

## 2019-11-19 NOTE — Progress Notes (Signed)
RN spoke with niece and let her know security is checking bus station for patient.  Niece made RN aware that patient stated he had a ride and may have left with a friend.

## 2019-11-19 NOTE — Discharge Instructions (Signed)
Mr. [place],  It was a pleasure working with you during your hospitalization. During your state you were treated for alcohol withdrawal. We treated you with IV fluids and Ativan for your withdrawal symptoms. You will be discharged with a Librium taker. Please take your medications as prescribed (1 pill 3 times daily on 11/19/19, 1 pill in the morning and before bed on 11/20/2019, an 1 pill on 11/21/2019). Please follow up with your primary care physician on the outpatient setting for follow up. Their information will be added to your discharge orders.

## 2019-11-22 ENCOUNTER — Encounter: Payer: Self-pay | Admitting: Internal Medicine

## 2019-11-22 ENCOUNTER — Ambulatory Visit (INDEPENDENT_AMBULATORY_CARE_PROVIDER_SITE_OTHER): Payer: Medicare HMO | Admitting: Internal Medicine

## 2019-11-22 VITALS — BP 133/78 | HR 79 | Temp 97.7°F | Ht 65.0 in | Wt 124.5 lb

## 2019-11-22 DIAGNOSIS — D509 Iron deficiency anemia, unspecified: Secondary | ICD-10-CM | POA: Insufficient documentation

## 2019-11-22 DIAGNOSIS — D508 Other iron deficiency anemias: Secondary | ICD-10-CM | POA: Diagnosis not present

## 2019-11-22 DIAGNOSIS — E872 Acidosis, unspecified: Secondary | ICD-10-CM

## 2019-11-22 DIAGNOSIS — G250 Essential tremor: Secondary | ICD-10-CM

## 2019-11-22 DIAGNOSIS — F1011 Alcohol abuse, in remission: Secondary | ICD-10-CM | POA: Diagnosis not present

## 2019-11-22 DIAGNOSIS — F1021 Alcohol dependence, in remission: Secondary | ICD-10-CM

## 2019-11-22 MED ORDER — NALTREXONE HCL 50 MG PO TABS: 50.0000 mg | ORAL_TABLET | Freq: Every day | ORAL | 0 refills | Status: DC

## 2019-11-22 NOTE — Assessment & Plan Note (Signed)
Continues to have intention tremor on finger-to-nose test.   Continue metoprolol 50mg  bid

## 2019-11-22 NOTE — Progress Notes (Signed)
° °  CC: hospital f/u for alcohol use disorder  HPI:  [name] is a 69 y.o. male with PMHx as listed below presenting for follow up of recent hospitalization for alcoholic intoxication with lactic acidosis resulting in anion gap metabolic acidosis. He reports feeling well since discharge and denies any acute concerns. Please see problem based charting for complete assessment and plan.  Past Medical History:  Diagnosis Date   Alcohol withdrawal delirium ([place]) 02/14/2019   Hypertension    Hypokalemia 07/19/2017   Malnutrition of moderate degree 10/24/2018   Review of Systems:  Negative except as stated in HPI.  Physical Exam:  Vitals:   11/22/19 1313 11/22/19 1332  BP: (!) 151/85 (!) 133/78  Pulse: 82 79  Temp: 97.7 F (36.5 C)   TempSrc: Oral   SpO2: 100%   Weight: 124 lb 8 oz (56.5 kg)   Height: 5\' 5"  (1.651 m)    Physical Exam  Constitutional: Appears well-developed and well-nourished. No distress.  HENT: Normocephalic and atraumatic, EOMI, conjunctiva normal, moist mucous membranes Cardiovascular: Normal rate, regular rhythm, S1 and S2 present, no murmurs, rubs, gallops.  Distal pulses intact Respiratory: No respiratory distress, no accessory muscle use.  Effort is normal.  Lungs are clear to auscultation bilaterally. GI: Nondistended, soft, nontender to palpation, normal active bowel sounds Musculoskeletal: Normal bulk and tone.  No peripheral edema noted. Neurological: Is alert and oriented x4, no apparent focal deficits noted. Skin: Warm and dry.  No rash, erythema, lesions noted. Psychiatric: Normal mood and affect. Behavior is normal. Judgment and thought content normal.    Assessment & Plan:   See Encounters Tab for problem based charting.  Patient discussed with Dr. Jimmye Norman

## 2019-11-22 NOTE — Assessment & Plan Note (Signed)
Patient endorses that he has abstained from hard liquor "for a while" now and only drinks beers. He endorses drinking one can of beer every other day since discharge. Denies any visual hallucinations. He does have a history of withdrawals but non recently. He endorses previously being on naltrexone but does not remember why he is no longer taking it. He is interested in taking this again.    Plan: Advised alcohol cessation Restart naltrexone 50mg  daily  Referral to CCM to provide community resources and assess for any other social determinants that may be causing him to continue alcohol use

## 2019-11-22 NOTE — Assessment & Plan Note (Signed)
Patient with history of microcytic anemia on ferrous sulfate supplementation. His iron deficiency is likely from poor dietary intake. He notes drinking more ensures now and improvement in appetite.   Plan: Continue ferrous sulfate 325mg  daily

## 2019-11-22 NOTE — Assessment & Plan Note (Signed)
Dan Henry is following up for recent hospitalization on 7/18-7/20 after a syncopal episode at a restaurant secondary to alcohol intoxication. He was noted to have alcohol level of 215. He was also noted to have elevated anion gap metabolic acidosis in setting of alcohol intoxication and lactic acidosis in setting of decreased oral intake for several days prior. Also noted to be orthostatics positive with prerenal AKI secondary to decreased oral intake. He was managed with IV fluid resuscitation for the AKI and orthostatics with improvement. Also started on librium taper for alcohol withdrawals and discharged in stable condition. He is following up today. He has continued the librium taper but endorses drinking 1 can of 12 oz beer every other day. He notes improvement in his appetite and endorses increased oral intake.  Patient advised to not drink while on librium taper. Discussed restarting naltrexone for which patient is agreeable. Will also f/u BMP at this visit to monitor renal function.

## 2019-11-22 NOTE — Patient Instructions (Addendum)
Mr. Fooks,  It was a pleasure seeing you in clinic. Today we discussed:   Alcohol use: I am sending prescription for naltrexone to help with stopping alcohol use. Please take this daily. Will also refer for community resources    I am checking labs at this visit and will call you with any abnormal results  If you have any questions or concerns, please call our clinic at [phone] between 9am-5pm and after hours call [phone] and ask for the internal medicine resident on call. If you feel you are having a medical emergency please call 911.   Thank you, we look forward to helping you remain healthy!

## 2019-11-23 LAB — BMP8+ANION GAP
Anion Gap: 16 mmol/L (ref 10.0–18.0)
BUN/Creatinine Ratio: 14 (ref 10–24)
BUN: 14 mg/dL (ref 8–27)
CO2: 21 mmol/L (ref 20–29)
Calcium: 9.6 mg/dL (ref 8.6–10.2)
Chloride: 105 mmol/L (ref 96–106)
Creatinine, Ser: 1.01 mg/dL (ref 0.76–1.27)
GFR calc Af Amer: 87 mL/min/{1.73_m2} (ref >59)
GFR calc non Af Amer: 76 mL/min/{1.73_m2} (ref >59)
Glucose: 89 mg/dL (ref 65–99)
Potassium: 3.5 mmol/L (ref 3.5–5.2)
Sodium: 142 mmol/L (ref 134–144)

## 2019-11-25 NOTE — Progress Notes (Signed)
Internal Medicine Clinic Attending ° °Case discussed with Dr. Aslam  At the time of the visit.  We reviewed the resident’s history and exam and pertinent patient test results.  I agree with the assessment, diagnosis, and plan of care documented in the resident’s note.  °

## 2019-12-06 NOTE — Addendum Note (Signed)
Addended by: Harvie Heck on: 12/06/2019 01:00 PM   Modules accepted: Orders

## 2019-12-25 ENCOUNTER — Encounter: Payer: Self-pay | Admitting: Licensed Clinical Social Worker

## 2020-01-21 IMAGING — MR MRI ABDOMEN WITH AND WITHOUT CONTRAST
9 of 16 series · 21 of 48 positions shown · IV contrast (gadavist)
Comparison: Ultrasound of 07/23/2018

CLINICAL DATA: Indeterminate liver lesion on ultrasound of
07/23/2018, for further workup.

EXAM:
MRI ABDOMEN WITHOUT AND WITH CONTRAST
TECHNIQUE: Multiplanar multisequence MR imaging of the abdomen was performed
both before and after the administration of intravenous contrast.
CONTRAST:  6 cc Gadavist

[Series 5: T2 · axial · 5.0mm · 0.78mm/px · 1 of 50 slices shown (1 of 2)]
[im 1/50]
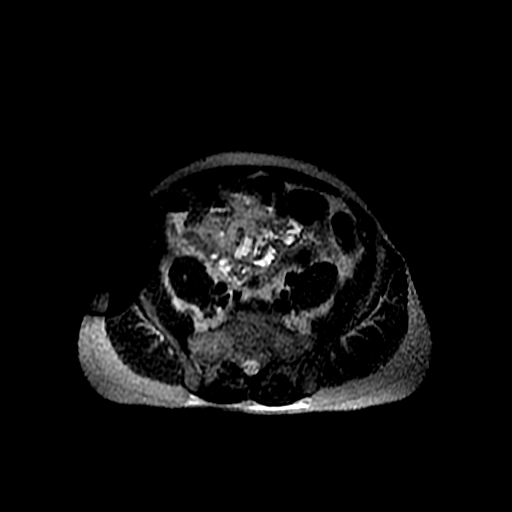

[Series 6: T2 · coronal · 5.0mm · 0.78mm/px · 1 of 39 slices shown (2 of 2)]
[im 1/39]
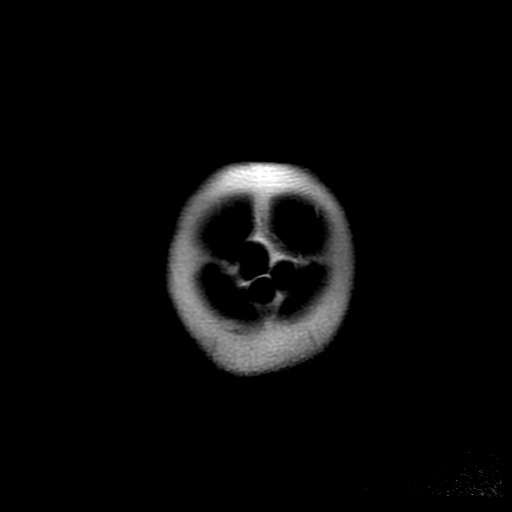

[Series 7: T2 fat-sat · axial · 5.0mm · 0.78mm/px · 1 of 46 slices shown]
[im 1/46]
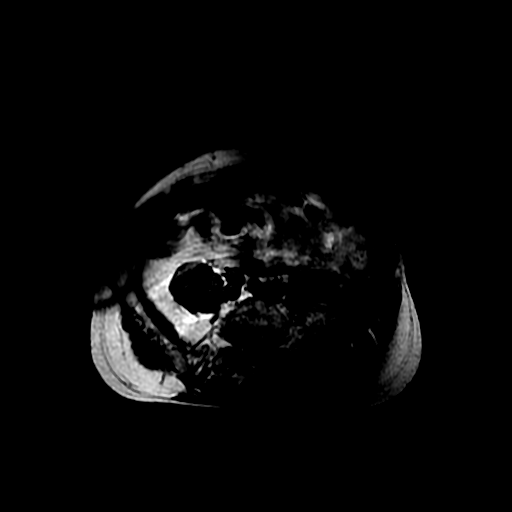

[Series 8: DWI b500 · axial · 6.0mm · 1.48mm/px · z∈[+4,+207]mm · 2 of 51 slices shown]
[im 1/51]
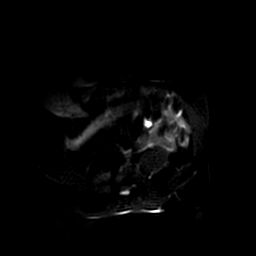
[im 51/51]
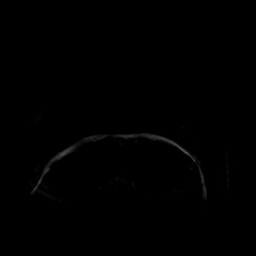

[Series 11: ax dualecho · axial · 5.0mm · 0.78mm/px · z∈[+8,+208]mm · 3 of 82 slices shown]
[im 1/82]
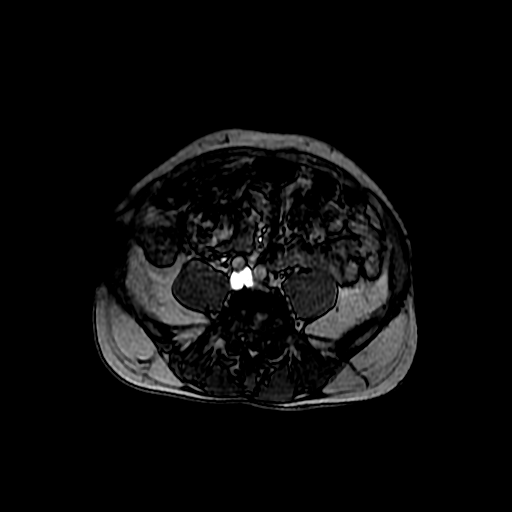
[im 41/82]
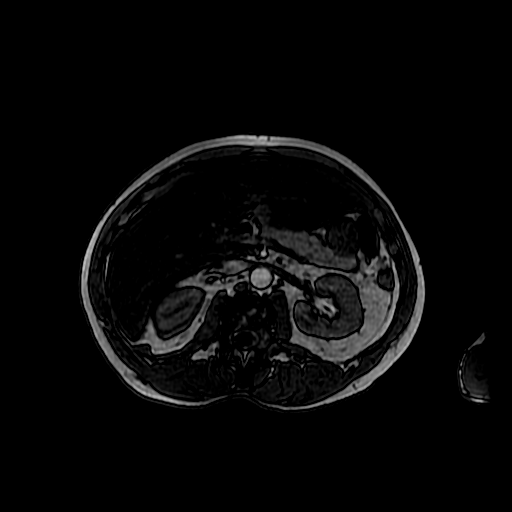
[im 82/82]
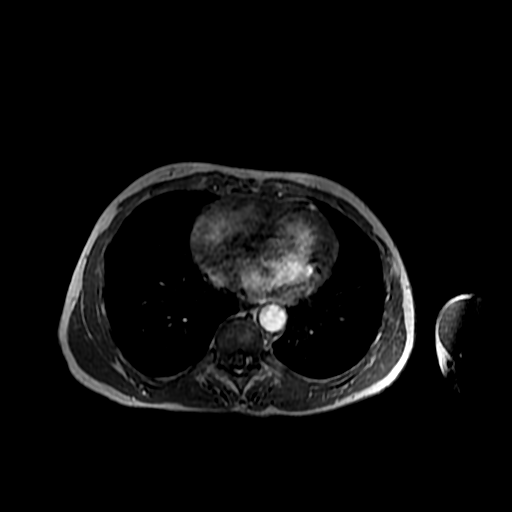

[Series 13: T1 dynamic post-contrast · coronal · 5.0mm · 0.78mm/px · 4 of 88 slices shown]
[im 1/88]
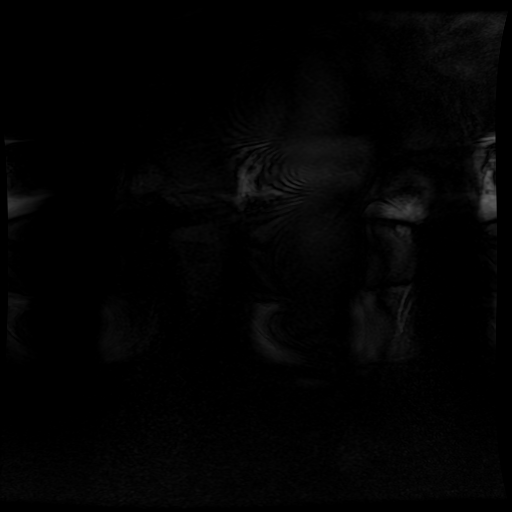
[im 30/88]
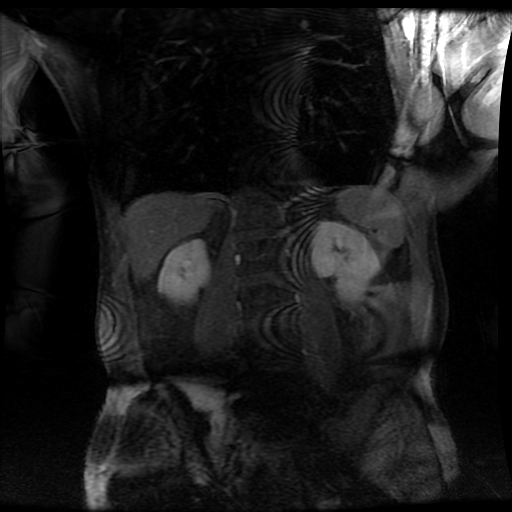
[im 59/88]
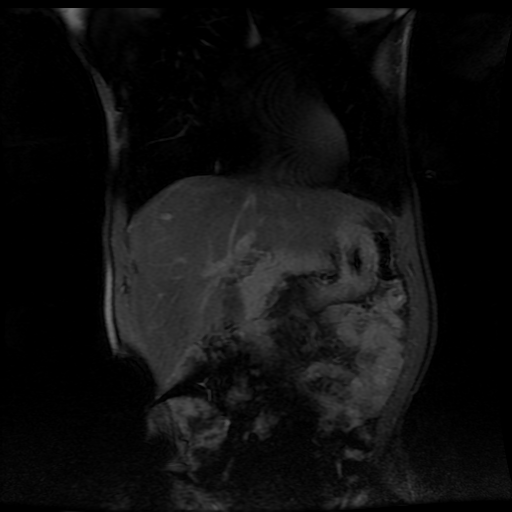
[im 88/88]
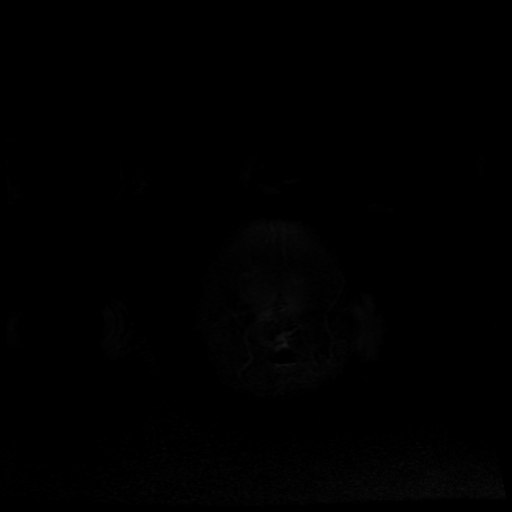

[Series 800: DWI · axial · 6.0mm · 1.48mm/px · 1 of 27 slices shown]
[im 1/27]
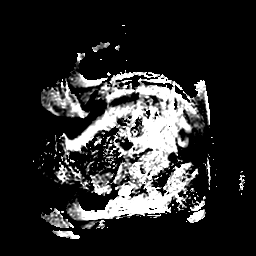

[Series 1200: T1 dynamic · axial · 5.0mm · 0.78mm/px · z∈[+10,+227]mm · 4 of 88 slices shown (1 of 2)]
[im 1/88]
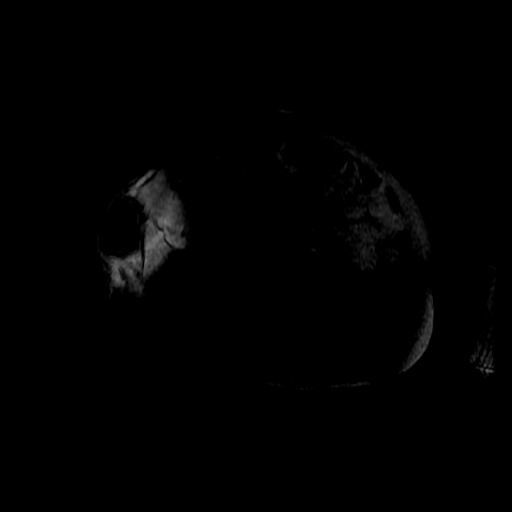
[im 30/88]
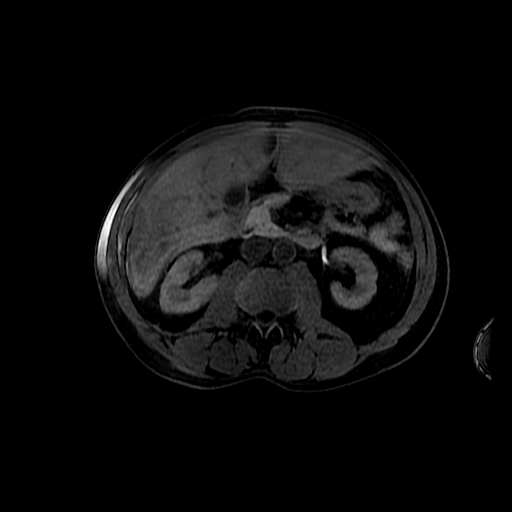
[im 59/88]
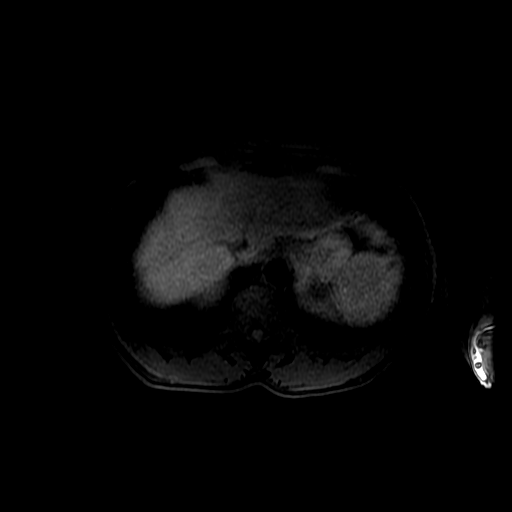
[im 88/88]
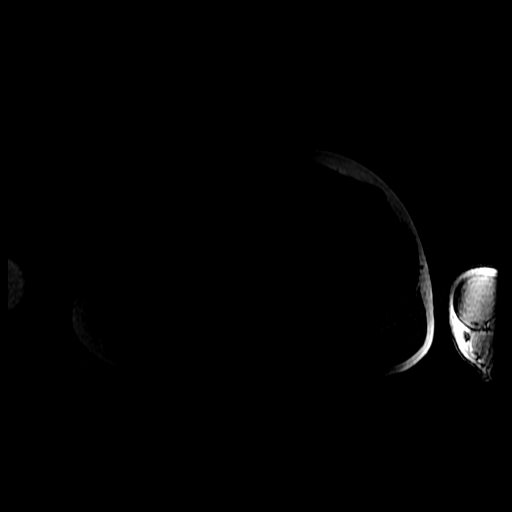

[Series 1201: T1 dynamic · axial · 5.0mm · 0.78mm/px · z∈[+10,+227]mm · 4 of 88 slices shown (2 of 2)]
[im 1/88]
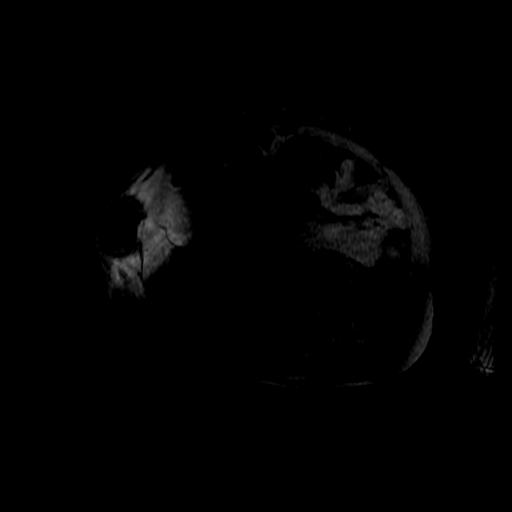
[im 30/88]
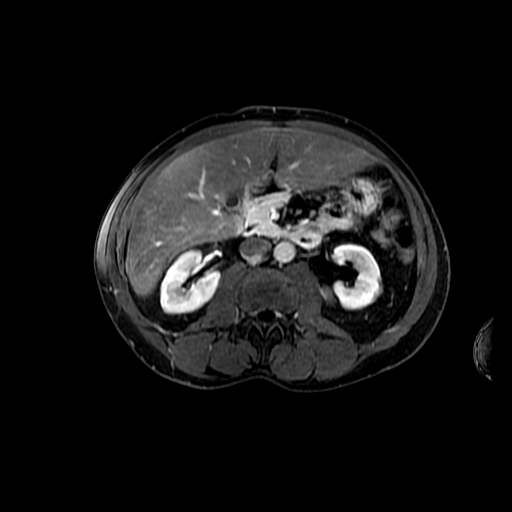
[im 59/88]
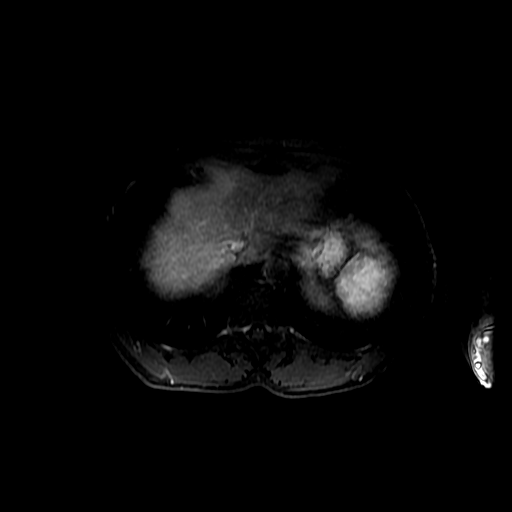
[im 88/88]
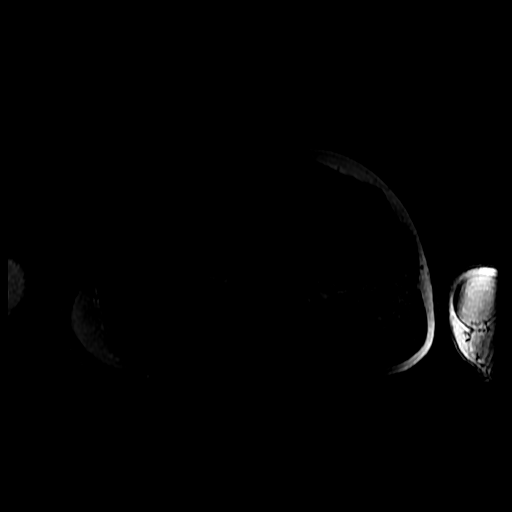

[21 of 48 positions shown; findings below may reference images not displayed]

FINDINGS: Lower chest: Unremarkable

Hepatobiliary: Diffuse hepatic steatosis.

In the right hepatic lobe a 1.0 by 0.9 by 0.6 cm lesion shown on
image [DATE] demonstrates low precontrast T1 signal intensity with
diffuse early arterial phase enhancement, portal venous phase
enhancement, and delayed enhancement, appearance compatible with a
flash filling hemangioma. Hemangiomas are often hyperechoic to
hepatic parenchyma but I suspect that hepatic parenchyma was
echogenic on ultrasound due to the underlying hepatic steatosis.

Gallbladder unremarkable.  No biliary dilatation.

A 0.7 by 0.5 cm lesion in the lateral segment left hepatic lobe on
image [DATE] does not enhance and is compatible with a small cyst. An
even smaller cyst is present in the right hepatic lobe on image
[id number], measuring about 3 mm in diameter.

Pancreas:  Unremarkable

Spleen:  Unremarkable

Adrenals/Urinary Tract: The adrenal glands appear normal. 0.7 cm
cyst in the left mid upper kidney anteriorly on image [id number].

Stomach/Bowel: Unremarkable

Vascular/Lymphatic:  Unremarkable

Other:  No supplemental non-categorized findings.

Musculoskeletal: Mild degenerative endplate findings at L5-S1.
IMPRESSION: 1. The lesion of concern in the right hepatic lobe appears to
represent a flash filling hemangioma. There is also a separate 6 mm
cyst in the lateral segment left hepatic lobe. No worrisome focal
liver lesions.
2. Diffuse hepatic steatosis.

## 2020-01-28 ENCOUNTER — Inpatient Hospital Stay (HOSPITAL_COMMUNITY)
Admission: EM | Admit: 2020-01-28 | Discharge: 2020-02-13 | DRG: 897 | Disposition: A | Discharge status: Home Health | Payer: Medicare HMO | Attending: Internal Medicine | Admitting: Internal Medicine

## 2020-01-28 ENCOUNTER — Other Ambulatory Visit: Payer: Self-pay

## 2020-01-28 ENCOUNTER — Emergency Department (HOSPITAL_COMMUNITY): Payer: Medicare HMO

## 2020-01-28 DIAGNOSIS — E872 Acidosis, unspecified: Secondary | ICD-10-CM | POA: Diagnosis present

## 2020-01-28 DIAGNOSIS — I10 Essential (primary) hypertension: Secondary | ICD-10-CM | POA: Diagnosis present

## 2020-01-28 DIAGNOSIS — Z20822 Contact with and (suspected) exposure to covid-19: Secondary | ICD-10-CM | POA: Diagnosis present

## 2020-01-28 DIAGNOSIS — Z6820 Body mass index (BMI) 20.0-20.9, adult: Secondary | ICD-10-CM | POA: Diagnosis not present

## 2020-01-28 DIAGNOSIS — R2689 Other abnormalities of gait and mobility: Secondary | ICD-10-CM | POA: Diagnosis not present

## 2020-01-28 DIAGNOSIS — E440 Moderate protein-calorie malnutrition: Secondary | ICD-10-CM | POA: Diagnosis not present

## 2020-01-28 DIAGNOSIS — G250 Essential tremor: Secondary | ICD-10-CM | POA: Diagnosis not present

## 2020-01-28 DIAGNOSIS — I2510 Atherosclerotic heart disease of native coronary artery without angina pectoris: Secondary | ICD-10-CM | POA: Diagnosis not present

## 2020-01-28 DIAGNOSIS — I160 Hypertensive urgency: Secondary | ICD-10-CM | POA: Diagnosis present

## 2020-01-28 DIAGNOSIS — M7989 Other specified soft tissue disorders: Secondary | ICD-10-CM | POA: Diagnosis not present

## 2020-01-28 DIAGNOSIS — D509 Iron deficiency anemia, unspecified: Secondary | ICD-10-CM | POA: Diagnosis present

## 2020-01-28 DIAGNOSIS — M6282 Rhabdomyolysis: Secondary | ICD-10-CM | POA: Diagnosis not present

## 2020-01-28 DIAGNOSIS — Z823 Family history of stroke: Secondary | ICD-10-CM

## 2020-01-28 DIAGNOSIS — R600 Localized edema: Secondary | ICD-10-CM | POA: Diagnosis not present

## 2020-01-28 DIAGNOSIS — Z79899 Other long term (current) drug therapy: Secondary | ICD-10-CM | POA: Diagnosis not present

## 2020-01-28 DIAGNOSIS — Z8249 Family history of ischemic heart disease and other diseases of the circulatory system: Secondary | ICD-10-CM | POA: Diagnosis not present

## 2020-01-28 DIAGNOSIS — R000 Tachycardia, unspecified: Secondary | ICD-10-CM | POA: Diagnosis not present

## 2020-01-28 DIAGNOSIS — R509 Fever, unspecified: Secondary | ICD-10-CM | POA: Diagnosis not present

## 2020-01-28 DIAGNOSIS — F10231 Alcohol dependence with withdrawal delirium: Principal | ICD-10-CM | POA: Diagnosis present

## 2020-01-28 DIAGNOSIS — R32 Unspecified urinary incontinence: Secondary | ICD-10-CM | POA: Diagnosis not present

## 2020-01-28 DIAGNOSIS — F10239 Alcohol dependence with withdrawal, unspecified: Secondary | ICD-10-CM | POA: Diagnosis present

## 2020-01-28 DIAGNOSIS — F1020 Alcohol dependence, uncomplicated: Secondary | ICD-10-CM

## 2020-01-28 DIAGNOSIS — F10939 Alcohol use, unspecified with withdrawal, unspecified: Secondary | ICD-10-CM | POA: Diagnosis present

## 2020-01-28 DIAGNOSIS — N179 Acute kidney failure, unspecified: Secondary | ICD-10-CM | POA: Diagnosis present

## 2020-01-28 DIAGNOSIS — R410 Disorientation, unspecified: Secondary | ICD-10-CM | POA: Diagnosis not present

## 2020-01-28 DIAGNOSIS — I6782 Cerebral ischemia: Secondary | ICD-10-CM | POA: Diagnosis not present

## 2020-01-28 DIAGNOSIS — R0902 Hypoxemia: Secondary | ICD-10-CM | POA: Diagnosis not present

## 2020-01-28 DIAGNOSIS — F10931 Alcohol use, unspecified with withdrawal delirium: Secondary | ICD-10-CM

## 2020-01-28 DIAGNOSIS — F1021 Alcohol dependence, in remission: Secondary | ICD-10-CM

## 2020-01-28 DIAGNOSIS — M79672 Pain in left foot: Secondary | ICD-10-CM | POA: Diagnosis not present

## 2020-01-28 DIAGNOSIS — E785 Hyperlipidemia, unspecified: Secondary | ICD-10-CM | POA: Diagnosis present

## 2020-01-28 DIAGNOSIS — N400 Enlarged prostate without lower urinary tract symptoms: Secondary | ICD-10-CM | POA: Diagnosis present

## 2020-01-28 DIAGNOSIS — R251 Tremor, unspecified: Secondary | ICD-10-CM | POA: Diagnosis not present

## 2020-01-28 DIAGNOSIS — E876 Hypokalemia: Secondary | ICD-10-CM | POA: Diagnosis not present

## 2020-01-28 DIAGNOSIS — M19072 Primary osteoarthritis, left ankle and foot: Secondary | ICD-10-CM | POA: Diagnosis not present

## 2020-01-28 DIAGNOSIS — R531 Weakness: Secondary | ICD-10-CM | POA: Diagnosis not present

## 2020-01-28 DIAGNOSIS — K7010 Alcoholic hepatitis without ascites: Secondary | ICD-10-CM | POA: Diagnosis not present

## 2020-01-28 LAB — URINALYSIS, ROUTINE W REFLEX MICROSCOPIC
Bacteria, UA: NONE SEEN
Bilirubin Urine: NEGATIVE
Glucose, UA: 150 mg/dL — AB
Ketones, ur: 20 mg/dL — AB
Leukocytes,Ua: NEGATIVE
Nitrite: NEGATIVE
Protein, ur: 100 mg/dL — AB
Specific Gravity, Urine: 1.017 (ref 1.005–1.030)
pH: 5 (ref 5.0–8.0)

## 2020-01-28 LAB — RESPIRATORY PANEL BY RT PCR (FLU A&B, COVID)
Influenza A by PCR: NEGATIVE
Influenza B by PCR: NEGATIVE
SARS Coronavirus 2 by RT PCR: NEGATIVE

## 2020-01-28 LAB — CBC WITH DIFFERENTIAL/PLATELET
Abs Immature Granulocytes: 0.02 10*3/uL (ref 0.00–0.07)
Basophils Absolute: 0.1 10*3/uL (ref 0.0–0.1)
Basophils Relative: 1 %
Eosinophils Absolute: 0 10*3/uL (ref 0.0–0.5)
Eosinophils Relative: 0 %
HCT: 36 % — ABNORMAL LOW (ref 39.0–52.0)
Hemoglobin: 11.5 g/dL — ABNORMAL LOW (ref 13.0–17.0)
Immature Granulocytes: 0 %
Lymphocytes Relative: 8 %
Lymphs Abs: 0.5 10*3/uL — ABNORMAL LOW (ref 0.7–4.0)
MCH: 26.4 pg (ref 26.0–34.0)
MCHC: 31.9 g/dL (ref 30.0–36.0)
MCV: 82.8 fL (ref 80.0–100.0)
Monocytes Absolute: 0.3 10*3/uL (ref 0.1–1.0)
Monocytes Relative: 6 %
Neutro Abs: 5.2 10*3/uL (ref 1.7–7.7)
Neutrophils Relative %: 85 %
Platelets: UNDETERMINED 10*3/uL (ref 150–400)
RBC: 4.35 MIL/uL (ref 4.22–5.81)
RDW: 25.2 % — ABNORMAL HIGH (ref 11.5–15.5)
WBC: 6.1 10*3/uL (ref 4.0–10.5)
nRBC: 0 % (ref 0.0–0.2)

## 2020-01-28 LAB — LACTIC ACID, PLASMA
Lactic Acid, Venous: 5.1 mmol/L (ref 0.5–1.9)
Lactic Acid, Venous: 1.6 mmol/L (ref 0.5–1.9)

## 2020-01-28 LAB — COMPREHENSIVE METABOLIC PANEL
ALT: 58 U/L — ABNORMAL HIGH (ref 0–44)
AST: 134 U/L — ABNORMAL HIGH (ref 15–41)
Albumin: 4.6 g/dL (ref 3.5–5.0)
Alkaline Phosphatase: 56 U/L (ref 38–126)
Anion gap: 19 — ABNORMAL HIGH (ref 5–15)
BUN: 14 mg/dL (ref 8–23)
CO2: 18 mmol/L — ABNORMAL LOW (ref 22–32)
Calcium: 9.2 mg/dL (ref 8.9–10.3)
Chloride: 102 mmol/L (ref 98–111)
Creatinine, Ser: 1.21 mg/dL (ref 0.61–1.24)
GFR calc Af Amer: >60 mL/min (ref >60)
GFR calc non Af Amer: >60 mL/min (ref >60)
Glucose, Bld: 204 mg/dL — ABNORMAL HIGH (ref 70–99)
Potassium: 3.8 mmol/L (ref 3.5–5.1)
Sodium: 139 mmol/L (ref 135–145)
Total Bilirubin: 1.1 mg/dL (ref 0.3–1.2)
Total Protein: 8.2 g/dL — ABNORMAL HIGH (ref 6.5–8.1)

## 2020-01-28 LAB — CBG MONITORING, ED: Glucose-Capillary: 151 mg/dL — ABNORMAL HIGH (ref 70–99)

## 2020-01-28 MED ORDER — LORAZEPAM 2 MG/ML IJ SOLN
2.0000 mg | Freq: Once | INTRAMUSCULAR | Status: AC
  Administered 2020-01-28: 2 mg via INTRAVENOUS
  Filled 2020-01-28: qty 1

## 2020-01-28 MED ORDER — METOCLOPRAMIDE HCL 5 MG/ML IJ SOLN: 10.0000 mg | Freq: Once | INTRAMUSCULAR | Status: DC

## 2020-01-28 MED ORDER — LABETALOL HCL 5 MG/ML IV SOLN
10.0000 mg | Freq: Once | INTRAVENOUS | Status: AC
  Administered 2020-01-29: 10 mg via INTRAVENOUS
  Filled 2020-01-28: qty 4

## 2020-01-28 MED ORDER — LORAZEPAM 1 MG PO TABS: 0.0000 mg | ORAL_TABLET | Freq: Four times a day (QID) | ORAL | Status: DC

## 2020-01-28 MED ORDER — METOPROLOL TARTRATE 25 MG PO TABS
25.0000 mg | ORAL_TABLET | Freq: Once | ORAL | Status: AC
  Administered 2020-01-28: 25 mg via ORAL
  Filled 2020-01-28: qty 1

## 2020-01-28 MED ORDER — SODIUM CHLORIDE 0.9 % IV BOLUS
1000.0000 mL | Freq: Once | INTRAVENOUS | Status: AC
  Administered 2020-01-28: 1000 mL via INTRAVENOUS

## 2020-01-28 MED ORDER — LORAZEPAM 1 MG PO TABS: 0.0000 mg | ORAL_TABLET | Freq: Two times a day (BID) | ORAL | Status: DC

## 2020-01-28 MED ORDER — FOLIC ACID 5 MG/ML IJ SOLN
1.0000 mg | Freq: Every day | INTRAMUSCULAR | Status: DC
  Administered 2020-01-28: 1 mg via INTRAVENOUS
  Filled 2020-01-28 (×2): qty 0.2

## 2020-01-28 MED ORDER — THIAMINE HCL 100 MG/ML IJ SOLN
100.0000 mg | Freq: Once | INTRAMUSCULAR | Status: AC
  Administered 2020-01-28: 100 mg via INTRAVENOUS
  Filled 2020-01-28: qty 2

## 2020-01-28 MED ORDER — LORAZEPAM 2 MG/ML IJ SOLN: 0.0000 mg | Freq: Two times a day (BID) | INTRAMUSCULAR | Status: DC

## 2020-01-28 MED ORDER — FENTANYL CITRATE (PF) 100 MCG/2ML IJ SOLN: 12.5000 ug | Freq: Once | INTRAMUSCULAR | Status: DC

## 2020-01-28 MED ORDER — LORAZEPAM 2 MG/ML IJ SOLN
0.0000 mg | Freq: Four times a day (QID) | INTRAMUSCULAR | Status: DC
  Administered 2020-01-28: 2 mg via INTRAVENOUS
  Filled 2020-01-28: qty 1

## 2020-01-28 MED ORDER — THIAMINE HCL 100 MG/ML IJ SOLN
100.0000 mg | Freq: Every day | INTRAMUSCULAR | Status: DC
  Filled 2020-01-28 (×3): qty 2

## 2020-01-28 MED ORDER — THIAMINE HCL 100 MG PO TABS
100.0000 mg | ORAL_TABLET | Freq: Every day | ORAL | Status: DC
  Administered 2020-01-29 – 2020-02-13 (×16): 100 mg via ORAL
  Filled 2020-01-28 (×16): qty 1

## 2020-01-28 NOTE — ED Notes (Signed)
Date and time results received: 01/28/20 6:02 PM Test: Lactic Acid Critical Value:5.1  Name of Provider Notified:

## 2020-01-28 NOTE — ED Notes (Signed)
Please have MD call Jaynee Eagles with update

## 2020-01-28 NOTE — ED Provider Notes (Signed)
MSE was initiated and I personally evaluated the patient and placed orders (if any) at  1:28 PM on January 28, 2020.  The patient appears stable so that the remainder of the MSE may be completed by another provider. Pt complains of tremors.  Pt's blood pressure is elevated.  Pt denies any history of tremors (records reviewed, pt has history of etoh abuse. PE  Elevated blood pressure, elevated heart rate. Labs ordered.  I ordered po ativan.    Fransico Meadow, [place] 01/28/20 1329    Pattricia Boss, MD 01/31/20 [phone]

## 2020-01-28 NOTE — ED Notes (Signed)
Pt is resting, no further bleeding from his left nare at this time.  Gauze 2x2 in place

## 2020-01-28 NOTE — ED Triage Notes (Signed)
Arrived via EMS; c/o diaphoretic, high BP, generalized weakness and bilateral extremities tremors. EMS concern for some seizure like activity. Patient denies history.

## 2020-01-28 NOTE — ED Notes (Signed)
Pt was very shaky when he came back and fidgety, see CIWA.  Pt also reported having hallucination.  Pt only admitted to drinking small amounts of ETOH and denies any withdrawal.  Pt reports feeling immediately better after ativan, tremors less and pt resting calmly at this time.

## 2020-01-28 NOTE — ED Provider Notes (Signed)
[facility] EMERGENCY DEPARTMENT Provider Note   CSN: [id number] Arrival date & time: 01/28/20  1304    History Chief Complaint  Patient presents with  . Hypertension    Dan Henry is a 69 y.o. male presenting today via EMS concerning for diaphoresis, lightheadedness, tremors, weakness.  States that he woke up this morning feeling too weak to walk well.  He states he woke up from a nap this afternoon feeling very sweaty and with all upper and lower extremity tremors.  Additionally, he feels like Dan heart is racing. He denies chest pain, shortness of breath, nausea, vomiting, diarrhea, cough, rash, congestion.  He denies fevers or chills at home.  He reports mild frontal headache.  Additionally he had a nosebleed this morning, resolved on its own.  He has not been vaccinated against COVID-19, and denies any recent sick contacts.  Personally reviewed Dan Henry medical record, and found a significant for history of alcohol abuse and multiple episodes of alcohol withdrawal with delirium. History of for CAD, hypertension, hyperlipidemia, alcoholic hepatitis, iron deficiency anemia, central tremor. He reports being on a medication for Dan heart, it appears he has an as needed nitroglycerin prescription for chest pain.  At time of my initial interview with Dan Henry, he states that he had 1 small glass of wine yesterday, and one small glass of wine approximately 3 days ago.  He denies any other alcohol usage in the past week.  He does endorse history of alcohol abuse in the past. Reports he has not had anything to eat today, however reports he did take Dan medications.  HPI     Past Medical History:  Diagnosis Date  . Alcohol withdrawal delirium ([address]) 02/14/2019  . Hypertension   . Hypokalemia 07/19/2017  . Malnutrition of moderate degree 10/24/2018    Patient Active Problem List   Diagnosis Date Noted  . Iron deficiency anemia 11/22/2019  . Syncope 11/18/2019   . Alcohol intoxication ([place]) 11/18/2019  . Stable angina ([place]) 05/08/2019  . Alcoholic hepatitis   . Nosebleed 04/23/2019  . Alcohol withdrawal ([place]) 04/23/2019  . Anemia 02/26/2019  . Urinary retention 10/25/2018  . Alcohol use disorder, severe, in early remission ([place]) 10/23/2018  . Hepatic hemangioma 10/19/2018  . Visual hallucinations 06/26/2018  . Abnormal TSH 06/26/2018  . Need for hepatitis B vaccination 02/21/2018  . Lactic acidosis 02/20/2018  . Unintentional weight loss 02/16/2018  . Benign essential tremor 07/19/2017  . Elevated LFTs 05/16/2017  . ASCVD (arteriosclerotic cardiovascular disease) 05/16/2017  . Right carotid bruit 05/08/2017  . Essential hypertension 05/21/2015  . Healthcare maintenance 05/21/2015    Past Surgical History:  Procedure Laterality Date  . NO PAST SURGERIES         Family History  Problem Relation Age of Onset  . Hypertension Mother   . Hypertension Father   . Hypertension Sister   . Hypertension Sister   . Stroke Sister   . Cancer Brother        throat    Social History   Tobacco Use  . Smoking status: Never Smoker  . Smokeless tobacco: Never Used  Vaping Use  . Vaping Use: Never used  Substance Use Topics  . Alcohol use: Yes    Alcohol/week: 4.0 standard drinks    Types: 4 Standard drinks or equivalent per week    Comment: occ  . Drug use: No    Home Medications Prior to Admission medications   Medication Sig Start Date End  Date Taking? Authorizing Provider  amLODipine (NORVASC) 10 MG tablet Take 1 tablet (10 mg total) by mouth daily. Patient not taking: Reported on 11/17/2019 02/26/19   Marcelyn Bruins, MD  atorvastatin (LIPITOR) 40 MG tablet Take 1 tablet (40 mg total) by mouth daily. Patient not taking: Reported on 11/17/2019 07/03/18   Oval Linsey, MD  chlordiazePOXIDE (LIBRIUM) 25 MG capsule Take 1 capsule (25 mg total) by mouth 3 (three) times daily. Take 1 capsule (25 mg total) by mouth 3 (three) times  daily on 11/19/2019 11/19/19   Maudie Mercury, MD  chlordiazePOXIDE (LIBRIUM) 25 MG capsule Take 1 capsule (25 mg total) by mouth 2 (two) times daily in the am and at bedtime.. Take 1 capsule (25mg  total) by mouth 2 (two) times daily in the morning and at bedtime on 11/20/2019. 11/20/19   Maudie Mercury, MD  chlordiazePOXIDE (LIBRIUM) 25 MG capsule Take 1 capsule (25 mg total) by mouth daily. Take 1 capsule (25mg  total) by mouth daily for 11/21/2019 11/21/19   Maudie Mercury, MD  feeding supplement, ENSURE ENLIVE, (ENSURE ENLIVE) LIQD Take 237 mLs by mouth 2 (two) times daily between meals. Patient taking differently: Take 237 mLs by mouth 3 (three) times daily between meals.  10/28/18   [name], MD  ferrous sulfate 325 (65 FE) MG tablet TAKE 1 TABLET BY MOUTH EVERY DAY Patient taking differently: Take 325 mg by mouth daily.  05/16/19   [name], MD  folic acid (FOLVITE) 1 MG tablet TAKE 1 TABLET BY MOUTH EVERY DAY Patient not taking: Reported on 11/17/2019 05/16/19   [name], MD  lisinopril (ZESTRIL) 10 MG tablet Take 1 tablet (10 mg total) by mouth daily. 02/27/19 02/27/20  Marcelyn Bruins, MD  metoprolol tartrate (LOPRESSOR) 25 MG tablet Take 1 tablet (25 mg total) by mouth 2 (two) times daily. 11/18/19 01/17/20  [name], MD  Multiple Vitamin (MULTIVITAMIN WITH MINERALS) TABS tablet Take 1 tablet by mouth every morning.    [provider]  naltrexone (DEPADE) 50 MG tablet Take 1 tablet (50 mg total) by mouth daily. 11/22/19   Harvie Heck, MD  nitroGLYCERIN (NITROSTAT) 0.4 MG SL tablet Place 1 tablet (0.4 mg total) under the tongue every 5 (five) minutes as needed for chest pain. 05/08/19   [name], MD  tamsulosin (FLOMAX) 0.4 MG CAPS capsule Take 1 capsule by mouth once daily Patient not taking: Reported on 11/17/2019 02/19/19   [name], MD  thiamine 100 MG tablet Take 1 tablet (100 mg total) by mouth daily. 04/26/19   Jeanmarie Hubert, MD    Allergies     Patient has no known allergies.  Review of Systems   Review of Systems  Constitutional: Positive for diaphoresis, fatigue and fever. Negative for activity change and chills.  HENT: Positive for nosebleeds. Negative for trouble swallowing.   Eyes: Negative.   Respiratory: Negative for cough, chest tightness, shortness of breath and wheezing.   Cardiovascular: Positive for palpitations. Negative for chest pain and leg swelling.  Gastrointestinal: Negative for abdominal pain, diarrhea, nausea and vomiting.  Genitourinary: Negative for dysuria.  Musculoskeletal: Negative.   Skin: Negative.   Neurological: Positive for tremors, weakness, light-headedness and headaches. Negative for dizziness and numbness.  Psychiatric/Behavioral: The patient is nervous/anxious.     Physical Exam Updated Vital Signs BP (!) 188/106   Pulse 83   Temp 99.5 F (37.5 C) (Oral)   Resp 16   Ht 5\' 5"  (1.651 m)  Wt 57 kg   SpO2 100%   BMI 20.91 kg/m   Physical Exam Vitals and nursing note reviewed.  Constitutional:      Appearance: He is ill-appearing and diaphoretic.     Comments: Patient with dirty nails and clothing, poor personal hygeine  HENT:     Head: Normocephalic and atraumatic.     Mouth/Throat:     Mouth: Mucous membranes are moist.     Pharynx: No oropharyngeal exudate or posterior oropharyngeal erythema.  Eyes:     General:        Right eye: No discharge.        Left eye: No discharge.     Extraocular Movements: Extraocular movements intact.     Pupils: Pupils are equal, round, and reactive to light.  Cardiovascular:     Rate and Rhythm: Regular rhythm. Tachycardia present.     Pulses: Normal pulses.     Heart sounds: Normal heart sounds.  Pulmonary:     Effort: Pulmonary effort is normal. No respiratory distress.     Breath sounds: Normal breath sounds. No wheezing or rales.  Abdominal:     General: Bowel sounds are normal. There is no distension.     Tenderness: There is no  abdominal tenderness. There is no guarding or rebound.  Musculoskeletal:        General: No deformity. Normal range of motion.     Cervical back: Neck supple.     Right lower leg: No edema.     Left lower leg: No edema.  Skin:    General: Skin is warm.     Capillary Refill: Capillary refill takes less than 2 seconds.  Neurological:     Mental Status: He is alert and oriented to person, place, and time.     Comments: Bilateral upper and lower extremity tremor at rest and with activation  Psychiatric:        Mood and Affect: Mood normal.     ED Results / Procedures / Treatments   Labs (all labs ordered are listed, but only abnormal results are displayed) Labs Reviewed  CBC WITH DIFFERENTIAL/PLATELET - Abnormal; Notable for the following components:      Result Value   Hemoglobin 11.5 (*)    HCT 36.0 (*)    RDW 25.2 (*)    Lymphs Abs 0.5 (*)    All other components within normal limits  COMPREHENSIVE METABOLIC PANEL - Abnormal; Notable for the following components:   CO2 18 (*)    Glucose, Bld 204 (*)    Total Protein 8.2 (*)    AST 134 (*)    ALT 58 (*)    Anion gap 19 (*)    All other components within normal limits  URINALYSIS, ROUTINE W REFLEX MICROSCOPIC - Abnormal; Notable for the following components:   Glucose, UA 150 (*)    Hgb urine dipstick MODERATE (*)    Ketones, ur 20 (*)    Protein, ur 100 (*)    All other components within normal limits  LACTIC ACID, PLASMA - Abnormal; Notable for the following components:   Lactic Acid, Venous 5.1 (*)    All other components within normal limits  CBG MONITORING, ED - Abnormal; Notable for the following components:   Glucose-Capillary 151 (*)    All other components within normal limits  RESPIRATORY PANEL BY RT PCR (FLU A&B, COVID)  LACTIC ACID, PLASMA  ETHANOL  CBC WITH DIFFERENTIAL/PLATELET  COMPREHENSIVE METABOLIC PANEL  LACTIC ACID, PLASMA  ETHANOL    EKG EKG Interpretation  Date/Time:  Tuesday January 28 2020 13:10:27 EDT Ventricular Rate:  115 PR Interval:  126 QRS Duration: 86 QT Interval:  342 QTC Calculation: 473 R Axis:   36 Text Interpretation: Sinus tachycardia Possible Left atrial enlargement Minimal voltage criteria for LVH, may be normal variant ( Sokolow-Lyon ) Nonspecific ST abnormality Abnormal ECG Confirmed by Carmin Muskrat [phone]) on 01/28/2020 4:35:43 PM   Radiology CT Head Wo Contrast  Result Date: 01/29/2020 CLINICAL DATA:  Delirium.  Generalized weakness.  Extremity tremors. EXAM: CT HEAD WITHOUT CONTRAST TECHNIQUE: Contiguous axial images were obtained from the base of the skull through the vertex without intravenous contrast. COMPARISON:  Head CT 11/17/2019 FINDINGS: Brain: Stable degree of atrophy. Stable degree of chronic small vessel ischemia. No intracranial hemorrhage, mass effect, or midline shift. No hydrocephalus. The basilar cisterns are patent. No evidence of territorial infarct or acute ischemia. No extra-axial or intracranial fluid collection. Vascular: No hyperdense vessel. Skull: No fracture or focal lesion. Sinuses/Orbits: Mucosal thickening of the maxillary sinuses and ethmoid air cells. Mastoid air cells are clear. Orbits are unremarkable. Other: None. IMPRESSION: 1. No acute intracranial abnormality. 2. Stable atrophy and chronic small vessel ischemia. Electronically Signed   By: Keith Rake M.D.   On: 01/29/2020 00:03    Procedures Procedures (including critical care time)  Medications Ordered in ED Medications  LORazepam (ATIVAN) injection 0-4 mg (0 mg Intravenous Not Given 01/28/20 2256)    Or  LORazepam (ATIVAN) tablet 0-4 mg ( Oral See Alternative 01/28/20 2256)  LORazepam (ATIVAN) injection 0-4 mg (has no administration in time range)    Or  LORazepam (ATIVAN) tablet 0-4 mg (has no administration in time range)  thiamine tablet 100 mg (100 mg Oral Not Given 01/28/20 1704)    Or  thiamine (B-1) injection 100 mg ( Intravenous See Alternative  08/13/22 4010)  folic acid injection 1 mg (1 mg Intravenous Given 01/28/20 2009)  LORazepam (ATIVAN) injection 2 mg (2 mg Intravenous Given 01/28/20 1703)  sodium chloride 0.9 % bolus 1,000 mL (0 mLs Intravenous Stopped 01/28/20 1825)  thiamine (B-1) injection 100 mg (100 mg Intravenous Given 01/28/20 1704)  sodium chloride 0.9 % bolus 1,000 mL (0 mLs Intravenous Stopped 01/28/20 1929)  metoprolol tartrate (LOPRESSOR) tablet 25 mg (25 mg Oral Given 01/28/20 2007)  labetalol (NORMODYNE) injection 10 mg (10 mg Intravenous Given 01/29/20 0005)    ED Course  I have reviewed the triage vital signs and the nursing notes.  Pertinent labs & imaging results that were available during my care of the patient were reviewed by me and considered in my medical decision making (see chart for details).  Clinical Course as of Jan 28 18  Tue Jan 28, 2020  2350 I personally spoke with the patient's emergency contact - Dan Henry - on the phone.  I was able to obtain collateral history from her.  She reports that the patient has had episodes of alcohol withdrawal in the past where he experienced diaphoresis, and tremors, and that alcohol abuse is not an ongoing issue for her uncle despite Dan efforts tonight.  However he has never experienced confusion like we have seen in the emergency department tonight, and he has never had issues ambulating independently or holding Dan bowels.  She states that Dan neighbors found him this morning and called EMS after he was diaphoretic and very unsteady on Dan feet. I informed the family of the patient's current status, and  our treatment plan moving forward. She was amenable to the patient's admission to the hospital tonight for further evaluation.  We will keep her in the loop moving forward.   [RS]    Clinical Course User Index [RS] Amiley Shishido, Gypsy Balsam, PA-C   MDM Rules/Calculators/A&P                          Dan Henry is a 69 year old male presenting with  diaphoresis, hypertension, upper and lower extremity tremors. He has history of alcohol abuse and withdrawal.  Differential diagnosis for this patient includes but is not limited to hypoglycemia, metabolic abnormality, substance withdrawal, infectious etiology, seizure activity.    CBG 151.   CBC with anemia 11.5 (Hgb previously 8.2)   CMP with elevated transaminase levels, AST/ALT 134/58 - elevated from prior.  EKG with sinus tachycardia, ST abnormality.  UA with moderate Hgb, ketone +, protein +   Respiratory panel PCR swab negative.  Ethanol still pending.  CIWA score 14 at the time of my initial exam. She does not have signs or symptoms pointing to infectious etiology at that time primary concern at this time is alcohol withdrawal. 1 L NS bolus, thiamine, folic acid, Ativan 2 mg administered.  On my reexamination of the patient he is sleeping calmly in Dan hospital bed.  Dan heart rate has normalized to the 80s on monitor, remains mildly hypertensive.  Critical lab - Lactic acid 5.1.  IV fluids ordered.  The time of my reevaluation of the patient at 8:05 PM - he states he is feeling much better.  He is resting comfortably in Dan bed.  He is still mildly tremulous in upper extremities, worsens with intention.  CIWA score 2 at 8:05pm.  He has received 2 L IV fluid resuscitation, will recheck lactic acid level. -Repeat lactate improved to 1.6.  I reexamined the patient at 9:15 PM.  At this time he seems to be less oriented than he was prior in the evening; he is unable to find words for what he wants to communicate.  Additionally he sitting up and speaking to me in the bed, however Dan eyes are closed; with direct instruction to open Dan eyes and make eye contact he is unable to do so. Shortly after this a nursing tech informed me that he had an episode of fecal incontinence in Dan bed, and when she tried to ambulate him with a walker he was unable to stand on Dan own, and was confused  about how to utilize the walker even after being show multiple times.  I have concern for this new alteration in Dan mental status. Additionally I contacted the lab regarding ethanol level that is still not back.  They informed me the lab did not receive the order; I reordered an ethanol level.  Will proceed with CT of the head in this patient who is unable to provide reliable history and seems to be altered from mental status at intake this afternoon.  Additionally the patient's blood pressure was 205/103 when I was in the room, remains hypertensive to despite metoprolol dosage. BP 188/106 at 11 pm. Labetalol ordered.    Plan to consult hospitalist team to admit this patient for hypertensive urgency and suspected alcohol withdrawal.  CT Head Pending at this time for workup of AMS.    Collateral history obtained from family as recorded above.  CT Head negative for acute intracranial abnormality.   Care of this patient was signed  out to oncoming PA Charlann Lange at shift change.  Examined the patient together, and he was disoriented to place, oriented to person. Plan to consult hospital medicine to admit the patient for hypertensive urgency and acute alteration of mental status in context of assumed alcohol withdrawal. Ethanol level pending at this time.    Final Clinical Impression(s) / ED Diagnoses Final diagnoses:  None    Rx / DC Orders ED Discharge Orders    None       Aura Dials 01/29/20 Allie Bossier, MD 01/29/20 2344

## 2020-01-28 NOTE — ED Notes (Signed)
Pt moved forward during covid test and once swab was pulled back pt has nosebleed.  TIssues given, will continue to monitor

## 2020-01-29 DIAGNOSIS — Z6820 Body mass index (BMI) 20.0-20.9, adult: Secondary | ICD-10-CM | POA: Diagnosis not present

## 2020-01-29 DIAGNOSIS — F10231 Alcohol dependence with withdrawal delirium: Secondary | ICD-10-CM | POA: Diagnosis present

## 2020-01-29 DIAGNOSIS — R531 Weakness: Secondary | ICD-10-CM | POA: Diagnosis not present

## 2020-01-29 DIAGNOSIS — Z823 Family history of stroke: Secondary | ICD-10-CM | POA: Diagnosis not present

## 2020-01-29 DIAGNOSIS — D509 Iron deficiency anemia, unspecified: Secondary | ICD-10-CM | POA: Diagnosis present

## 2020-01-29 DIAGNOSIS — E440 Moderate protein-calorie malnutrition: Secondary | ICD-10-CM | POA: Diagnosis present

## 2020-01-29 DIAGNOSIS — Z8249 Family history of ischemic heart disease and other diseases of the circulatory system: Secondary | ICD-10-CM | POA: Diagnosis not present

## 2020-01-29 DIAGNOSIS — M79672 Pain in left foot: Secondary | ICD-10-CM | POA: Diagnosis not present

## 2020-01-29 DIAGNOSIS — E785 Hyperlipidemia, unspecified: Secondary | ICD-10-CM | POA: Diagnosis present

## 2020-01-29 DIAGNOSIS — K7010 Alcoholic hepatitis without ascites: Secondary | ICD-10-CM | POA: Diagnosis not present

## 2020-01-29 DIAGNOSIS — E876 Hypokalemia: Secondary | ICD-10-CM | POA: Diagnosis not present

## 2020-01-29 DIAGNOSIS — R32 Unspecified urinary incontinence: Secondary | ICD-10-CM | POA: Diagnosis not present

## 2020-01-29 DIAGNOSIS — R509 Fever, unspecified: Secondary | ICD-10-CM | POA: Diagnosis not present

## 2020-01-29 DIAGNOSIS — I160 Hypertensive urgency: Secondary | ICD-10-CM | POA: Diagnosis present

## 2020-01-29 DIAGNOSIS — N400 Enlarged prostate without lower urinary tract symptoms: Secondary | ICD-10-CM

## 2020-01-29 DIAGNOSIS — E872 Acidosis: Secondary | ICD-10-CM | POA: Diagnosis present

## 2020-01-29 DIAGNOSIS — M6282 Rhabdomyolysis: Secondary | ICD-10-CM | POA: Diagnosis present

## 2020-01-29 DIAGNOSIS — I2510 Atherosclerotic heart disease of native coronary artery without angina pectoris: Secondary | ICD-10-CM

## 2020-01-29 DIAGNOSIS — G250 Essential tremor: Secondary | ICD-10-CM

## 2020-01-29 DIAGNOSIS — F10239 Alcohol dependence with withdrawal, unspecified: Secondary | ICD-10-CM

## 2020-01-29 DIAGNOSIS — Z79899 Other long term (current) drug therapy: Secondary | ICD-10-CM | POA: Diagnosis not present

## 2020-01-29 DIAGNOSIS — I10 Essential (primary) hypertension: Secondary | ICD-10-CM | POA: Diagnosis present

## 2020-01-29 DIAGNOSIS — Z20822 Contact with and (suspected) exposure to covid-19: Secondary | ICD-10-CM | POA: Diagnosis present

## 2020-01-29 DIAGNOSIS — N179 Acute kidney failure, unspecified: Secondary | ICD-10-CM | POA: Diagnosis present

## 2020-01-29 DIAGNOSIS — R2689 Other abnormalities of gait and mobility: Secondary | ICD-10-CM | POA: Diagnosis not present

## 2020-01-29 LAB — CBC
HCT: 35.9 % — ABNORMAL LOW (ref 39.0–52.0)
Hemoglobin: 11.7 g/dL — ABNORMAL LOW (ref 13.0–17.0)
MCH: 26.7 pg (ref 26.0–34.0)
MCHC: 32.6 g/dL (ref 30.0–36.0)
MCV: 82 fL (ref 80.0–100.0)
RBC: 4.38 MIL/uL (ref 4.22–5.81)
RDW: 24.5 % — ABNORMAL HIGH (ref 11.5–15.5)
WBC: 4.1 10*3/uL (ref 4.0–10.5)
nRBC: 0 % (ref 0.0–0.2)

## 2020-01-29 LAB — COMPREHENSIVE METABOLIC PANEL
ALT: 44 U/L (ref 0–44)
AST: 86 U/L — ABNORMAL HIGH (ref 15–41)
Albumin: 3.9 g/dL (ref 3.5–5.0)
Alkaline Phosphatase: 46 U/L (ref 38–126)
Anion gap: 14 (ref 5–15)
BUN: 9 mg/dL (ref 8–23)
CO2: 23 mmol/L (ref 22–32)
Calcium: 8.7 mg/dL — ABNORMAL LOW (ref 8.9–10.3)
Chloride: 101 mmol/L (ref 98–111)
Creatinine, Ser: 0.82 mg/dL (ref 0.61–1.24)
GFR calc Af Amer: >60 mL/min (ref >60)
GFR calc non Af Amer: >60 mL/min (ref >60)
Glucose, Bld: 103 mg/dL — ABNORMAL HIGH (ref 70–99)
Potassium: 3.6 mmol/L (ref 3.5–5.1)
Sodium: 138 mmol/L (ref 135–145)
Total Bilirubin: 1.2 mg/dL (ref 0.3–1.2)
Total Protein: 7.4 g/dL (ref 6.5–8.1)

## 2020-01-29 LAB — CK: Total CK: 999 U/L — ABNORMAL HIGH (ref 49–397)

## 2020-01-29 LAB — MAGNESIUM: Magnesium: 1.4 mg/dL — ABNORMAL LOW (ref 1.7–2.4)

## 2020-01-29 LAB — FERRITIN: Ferritin: 45 ng/mL (ref 24–336)

## 2020-01-29 LAB — IRON AND TIBC
Iron: 94 ug/dL (ref 45–182)
Saturation Ratios: 25 % (ref 17.9–39.5)
TIBC: 374 ug/dL (ref 250–450)
UIBC: 280 ug/dL

## 2020-01-29 LAB — LACTIC ACID, PLASMA
Lactic Acid, Venous: 2.4 mmol/L (ref 0.5–1.9)
Lactic Acid, Venous: 1.7 mmol/L (ref 0.5–1.9)

## 2020-01-29 LAB — ETHANOL: Alcohol, Ethyl (B): <10 mg/dL (ref <10)

## 2020-01-29 LAB — PHOSPHORUS: Phosphorus: 2.3 mg/dL — ABNORMAL LOW (ref 2.5–4.6)

## 2020-01-29 LAB — PROTIME-INR
INR: 1.1 (ref 0.8–1.2)
Prothrombin Time: 14.1 seconds (ref 11.4–15.2)

## 2020-01-29 LAB — AMMONIA: Ammonia: 19 umol/L (ref 9–35)

## 2020-01-29 MED ORDER — TAMSULOSIN HCL 0.4 MG PO CAPS
0.4000 mg | ORAL_CAPSULE | Freq: Every day | ORAL | Status: DC
  Administered 2020-01-29 – 2020-02-13 (×16): 0.4 mg via ORAL
  Filled 2020-01-29 (×17): qty 1

## 2020-01-29 MED ORDER — LORAZEPAM 2 MG/ML IJ SOLN
1.0000 mg | INTRAMUSCULAR | Status: DC | PRN
  Administered 2020-01-30 – 2020-01-31 (×4): 2 mg via INTRAVENOUS
  Administered 2020-01-31: 1 mg via INTRAVENOUS
  Filled 2020-01-29 (×3): qty 1

## 2020-01-29 MED ORDER — POLYETHYLENE GLYCOL 3350 17 G PO PACK
17.0000 g | PACK | Freq: Every day | ORAL | Status: DC | PRN
  Administered 2020-02-10: 17 g via ORAL
  Filled 2020-01-29: qty 1

## 2020-01-29 MED ORDER — LACTATED RINGERS IV SOLN: INTRAVENOUS | Status: AC

## 2020-01-29 MED ORDER — METOPROLOL TARTRATE 25 MG PO TABS
25.0000 mg | ORAL_TABLET | Freq: Two times a day (BID) | ORAL | Status: DC
  Administered 2020-01-29 – 2020-02-13 (×30): 25 mg via ORAL
  Filled 2020-01-29 (×27): qty 1
  Filled 2020-01-29: qty 2
  Filled 2020-01-29 (×4): qty 1

## 2020-01-29 MED ORDER — MAGNESIUM SULFATE 2 GM/50ML IV SOLN
2.0000 g | Freq: Once | INTRAVENOUS | Status: AC
  Administered 2020-01-29: 2 g via INTRAVENOUS
  Filled 2020-01-29: qty 50

## 2020-01-29 MED ORDER — POTASSIUM PHOSPHATES 15 MMOLE/5ML IV SOLN
15.0000 mmol | Freq: Once | INTRAVENOUS | Status: AC
  Administered 2020-01-29: 15 mmol via INTRAVENOUS
  Filled 2020-01-29: qty 5

## 2020-01-29 MED ORDER — LACTATED RINGERS IV BOLUS
500.0000 mL | Freq: Once | INTRAVENOUS | Status: AC
  Administered 2020-01-29: 500 mL via INTRAVENOUS

## 2020-01-29 MED ORDER — AMLODIPINE BESYLATE 10 MG PO TABS
10.0000 mg | ORAL_TABLET | Freq: Every day | ORAL | Status: DC
  Administered 2020-01-29 – 2020-02-13 (×16): 10 mg via ORAL
  Filled 2020-01-29 (×2): qty 1
  Filled 2020-01-29: qty 2
  Filled 2020-01-29 (×11): qty 1
  Filled 2020-01-29: qty 2
  Filled 2020-01-29 (×2): qty 1

## 2020-01-29 MED ORDER — FOLIC ACID 1 MG PO TABS
1.0000 mg | ORAL_TABLET | Freq: Every day | ORAL | Status: DC
  Administered 2020-01-30 – 2020-02-13 (×15): 1 mg via ORAL
  Filled 2020-01-29 (×15): qty 1

## 2020-01-29 MED ORDER — NITROGLYCERIN 0.4 MG SL SUBL: 0.4000 mg | SUBLINGUAL_TABLET | SUBLINGUAL | Status: DC | PRN

## 2020-01-29 MED ORDER — LORAZEPAM 2 MG/ML IJ SOLN
0.0000 mg | INTRAMUSCULAR | Status: DC
  Administered 2020-01-29 (×2): 1 mg via INTRAVENOUS
  Administered 2020-01-29: 4 mg via INTRAVENOUS
  Administered 2020-01-30 (×5): 2 mg via INTRAVENOUS
  Administered 2020-01-31: 4 mg via INTRAVENOUS
  Filled 2020-01-29 (×4): qty 1
  Filled 2020-01-29: qty 2
  Filled 2020-01-29 (×3): qty 1
  Filled 2020-01-29: qty 2
  Filled 2020-01-29: qty 1

## 2020-01-29 MED ORDER — LOPERAMIDE HCL 2 MG PO CAPS: 2.0000 mg | ORAL_CAPSULE | ORAL | Status: AC | PRN

## 2020-01-29 MED ORDER — CHLORDIAZEPOXIDE HCL 25 MG PO CAPS: 25.0000 mg | ORAL_CAPSULE | Freq: Four times a day (QID) | ORAL | Status: DC

## 2020-01-29 MED ORDER — LORAZEPAM 2 MG/ML IJ SOLN
1.0000 mg | Freq: Once | INTRAMUSCULAR | Status: AC
  Administered 2020-01-29: 1 mg via INTRAVENOUS
  Filled 2020-01-29: qty 1

## 2020-01-29 MED ORDER — ACETAMINOPHEN 325 MG PO TABS: 650.0000 mg | ORAL_TABLET | ORAL | Status: DC | PRN

## 2020-01-29 MED ORDER — LORAZEPAM 1 MG PO TABS
1.0000 mg | ORAL_TABLET | ORAL | Status: DC | PRN
  Administered 2020-01-29: 1 mg via ORAL
  Administered 2020-01-30: 2 mg via ORAL
  Administered 2020-01-30 (×2): 3 mg via ORAL
  Administered 2020-01-31: 2 mg via ORAL
  Filled 2020-01-29: qty 3
  Filled 2020-01-29: qty 2
  Filled 2020-01-29: qty 1
  Filled 2020-01-29: qty 3
  Filled 2020-01-29: qty 2

## 2020-01-29 MED ORDER — ADULT MULTIVITAMIN W/MINERALS CH
1.0000 | ORAL_TABLET | Freq: Every day | ORAL | Status: DC
  Administered 2020-01-30 – 2020-02-13 (×15): 1 via ORAL
  Filled 2020-01-29 (×15): qty 1

## 2020-01-29 MED ORDER — THIAMINE HCL 100 MG PO TABS: 100.0000 mg | ORAL_TABLET | Freq: Every day | ORAL | Status: DC

## 2020-01-29 MED ORDER — ACETAMINOPHEN 650 MG RE SUPP: 650.0000 mg | RECTAL | Status: DC | PRN

## 2020-01-29 MED ORDER — ONDANSETRON HCL 4 MG PO TABS: 4.0000 mg | ORAL_TABLET | Freq: Four times a day (QID) | ORAL | Status: DC | PRN

## 2020-01-29 MED ORDER — CHLORDIAZEPOXIDE HCL 25 MG PO CAPS: 25.0000 mg | ORAL_CAPSULE | Freq: Three times a day (TID) | ORAL | Status: DC

## 2020-01-29 MED ORDER — CHLORDIAZEPOXIDE HCL 25 MG PO CAPS: 25.0000 mg | ORAL_CAPSULE | Freq: Every day | ORAL | Status: DC

## 2020-01-29 MED ORDER — ATORVASTATIN CALCIUM 40 MG PO TABS
40.0000 mg | ORAL_TABLET | Freq: Every day | ORAL | Status: DC
  Administered 2020-01-29: 40 mg via ORAL
  Filled 2020-01-29: qty 1

## 2020-01-29 MED ORDER — LISINOPRIL 10 MG PO TABS
10.0000 mg | ORAL_TABLET | Freq: Every day | ORAL | Status: DC
  Administered 2020-01-29 – 2020-02-13 (×16): 10 mg via ORAL
  Filled 2020-01-29 (×17): qty 1

## 2020-01-29 MED ORDER — CHLORDIAZEPOXIDE HCL 25 MG PO CAPS
25.0000 mg | ORAL_CAPSULE | Freq: Two times a day (BID) | ORAL | Status: DC
  Administered 2020-01-29 – 2020-01-30 (×3): 25 mg via ORAL
  Filled 2020-01-29 (×3): qty 1

## 2020-01-29 MED ORDER — CHLORDIAZEPOXIDE HCL 25 MG PO CAPS: 25.0000 mg | ORAL_CAPSULE | ORAL | Status: DC

## 2020-01-29 MED ORDER — LORAZEPAM 2 MG/ML IJ SOLN
0.0000 mg | Freq: Three times a day (TID) | INTRAMUSCULAR | Status: DC
  Administered 2020-01-31: 1 mg via INTRAVENOUS
  Administered 2020-01-31: 2 mg via INTRAVENOUS
  Filled 2020-01-29 (×4): qty 1

## 2020-01-29 MED ORDER — ONDANSETRON HCL 4 MG/2ML IJ SOLN: 4.0000 mg | Freq: Four times a day (QID) | INTRAMUSCULAR | Status: DC | PRN

## 2020-01-29 MED ORDER — ENOXAPARIN SODIUM 40 MG/0.4ML ~~LOC~~ SOLN
40.0000 mg | SUBCUTANEOUS | Status: DC
  Administered 2020-01-30 – 2020-02-13 (×13): 40 mg via SUBCUTANEOUS
  Filled 2020-01-29 (×16): qty 0.4

## 2020-01-29 MED ORDER — THIAMINE HCL 100 MG/ML IJ SOLN: 100.0000 mg | Freq: Every day | INTRAMUSCULAR | Status: DC

## 2020-01-29 NOTE — ED Provider Notes (Signed)
Patient care signed out by previous treatment team, being evaluated for possible alcohol withdrawal with ss/sxs tremors, diaphoresis, trouble ambulating, weakness, confusion.  H/o of ETOH and withdrawal with delirium, ? Withdrawal now Better with Ativan Lactic acid 5.1, then 1.2 after fluids Onset during ED encounter, developed decreased mental status:  Now confused, unsafe ambulation, fecal incontinence, speech mumbled. No lateralizing weakness or facial droop. Pending CT, ETOH level Has gotten Metoprolol earlier (home dose), recent dose labetolol at 12:05 - recheck blood pressure.   Plan: Altered mental status, htn urgency in setting of withdrawal Has received 2 liters fluids  Needs review of CT scan, recheck blood pressure, admission  CT scan negative for acute change ETOH negative Ammonia pending  On my exam, the patient is disoriented, tremulous, confused. He remains hypertensive despite Labetolol IV x 2 and dose of home Metoprolol. Under CIWA protocol now.   He will require admission as per plan of previous team. Discussed with internal medicine admitting resident who accepts for admission.    Charlann Lange, PA-C 01/29/20 0125    Carmin Muskrat, MD 01/29/20 [phone]

## 2020-01-29 NOTE — Progress Notes (Addendum)
HD#0 Subjective:   Patient evaluated at bedside this morning. He is lethargic, but feeling much better and not currently having abdominal pain, nausea, or vomiting, seizures, loc, or trauma. Reports having hallucinations with withdrawal in the past. States he has not drank alcohol in the past week but was previously drinking 2-3 beers a day.   Objective:  Vital signs in last 24 hours: Vitals:   01/29/20 0645 01/29/20 0715 01/29/20 0745 01/29/20 1051  BP: 106/68 113/72 132/75 118/72  Pulse: 78 88 79 85  Resp:      Temp:      TempSrc:      SpO2: 100% 99% 99%   Weight:      Height:       Supplemental O2: Room Air SpO2: 99 %   Physical Exam:  Physical Exam Constitutional:      General: He is in acute distress.  Cardiovascular:     Rate and Rhythm: Normal rate and regular rhythm.     Pulses: Normal pulses.     Heart sounds: Normal heart sounds. No murmur heard.   Pulmonary:     Effort: Pulmonary effort is normal.     Breath sounds: Normal breath sounds.  Abdominal:     General: Abdomen is flat. Bowel sounds are normal. There is no distension.     Palpations: Abdomen is soft.     Tenderness: There is no abdominal tenderness.  Skin:    Capillary Refill: Capillary refill takes less than 2 seconds.  Neurological:     Mental Status: He is oriented to person, place, and time.     Comments: Lethargic, no tremor, no asterix     [employer]   01/28/20 1311  Weight: 57 kg     Intake/Output Summary (Last 24 hours) at 01/29/2020 1058 Last data filed at 01/29/2020 0415 Gross per 24 hour  Intake 1500 ml  Output --  Net 1500 ml   Net IO Since Admission: 1,500 mL [01/29/20 1058]  Pertinent Labs: CBC Latest Ref Rng & Units 01/29/2020 01/28/2020 11/18/2019  WBC 4.0 - 10.5 K/uL 4.1 6.1 4.0  Hemoglobin 13.0 - 17.0 g/dL 11.7(L) 11.5(L) 8.2(L)  Hematocrit 39 - 52 % 35.9(L) 36.0(L) 27.8(L)  Platelets 150 - 400 K/uL PLATELET CLUMPING, SUGGEST RECOLLECTION OF SAMPLE IN CITRATE  TUBE. PLATELET CLUMPS NOTED ON SMEAR, UNABLE TO ESTIMATE 157    CMP Latest Ref Rng & Units 01/29/2020 01/28/2020 11/22/2019  Glucose 70 - 99 mg/dL 103(H) 204(H) 89  BUN 8 - 23 mg/dL 9 14 14   Creatinine 0.61 - 1.24 mg/dL 0.82 1.21 1.01  Sodium 135 - 145 mmol/L 138 139 142  Potassium 3.5 - 5.1 mmol/L 3.6 3.8 3.5  Chloride 98 - 111 mmol/L 101 102 105  CO2 22 - 32 mmol/L 23 18(L) 21  Calcium 8.9 - 10.3 mg/dL 8.7(L) 9.2 9.6  Total Protein 6.5 - 8.1 g/dL 7.4 8.2(H) -  Total Bilirubin 0.3 - 1.2 mg/dL 1.2 1.1 -  Alkaline Phos 38 - 126 U/L 46 56 -  AST 15 - 41 U/L 86(H) 134(H) -  ALT 0 - 44 U/L 44 58(H) -    Imaging: CT Head Wo Contrast  Result Date: 01/29/2020 CLINICAL DATA:  Delirium.  Generalized weakness.  Extremity tremors. EXAM: CT HEAD WITHOUT CONTRAST TECHNIQUE: Contiguous axial images were obtained from the base of the skull through the vertex without intravenous contrast. COMPARISON:  Head CT 11/17/2019 FINDINGS: Brain: Stable degree of atrophy. Stable degree of chronic small vessel  ischemia. No intracranial hemorrhage, mass effect, or midline shift. No hydrocephalus. The basilar cisterns are patent. No evidence of territorial infarct or acute ischemia. No extra-axial or intracranial fluid collection. Vascular: No hyperdense vessel. Skull: No fracture or focal lesion. Sinuses/Orbits: Mucosal thickening of the maxillary sinuses and ethmoid air cells. Mastoid air cells are clear. Orbits are unremarkable. Other: None. IMPRESSION: 1. No acute intracranial abnormality. 2. Stable atrophy and chronic small vessel ischemia. Electronically Signed   By: Keith Rake M.D.   On: 01/29/2020 00:03    Assessment/Plan:   Principal Problem:   Alcohol withdrawal ([place]) Active Problems:   Essential hypertension   Lactic acidosis   Alcohol use disorder, severe, in early remission [facility])   Patient Summary: [name] with PMH of alcohol use disorder, CAD, HTN, alcoholic  hepatitis, iron deficiency anemia, and benign essential tremors who presented to the ED symptoms consistent with ETOH withdraw and admitted for lactic acidosis and alcohol withdrawal.    #ETOH Withdraw #Alcohol use disorder #Lactic acidosis in the setting of alcohol withdrawl Patient today is feeling more comfortable. He has had multiple previous hospitalizations for alcohol withdrawal with alcoholic hallucinations. Unsure if he has had delirium tremens prior. On exam patient is lethargic on exam. Lactic acid 1.7 this morning after fluid resuscitation with resolved anion gap. --CIWA w/ ativan protocol -- Librium decreased to 25 mg twice daily --Thiamine. Folic Acid, Multivitamin  --CMP in AM --Follow up CK --Telemetry --Seizure precautions   #Hypertension Presented with severe hypertension and lactic acidosis likely secondary to alcohol withdrawal.  BP normalized to 120/68, mental status improved. Lactic acid 1.7 with fluids and . Resumed home medicaitons --Metoprolol 25 mg twice daily --Lisinopril 10 mg daily --Amlodipine 10 mg --Daily vitals   #Alcoholic hepatitis History of alcohol hepatitis with AST/ALT of 134/58 on arrival. AST/ALT 86/44 this morning. - Encourage alcohol cessation   #Microcytic anemia, chronic Found to have a hemoglobin of 11.5 on admission.  Improved from 8.3 seven months ago. Likely secondary to iron deficiency anemia due to low iron in the past. On ferrous sulfate 325 mg at home. --Iron and TIBC levels --Ferritin --CBC   #BPH Hx of BPH on Flomax at home. No dysuria.  --Flomax 0.4 mg daily --Monitor for urinary retention   #CAD Patient with a history of angina on nitroglycerin as needed for chest pain at home. --Lipitor 40 mg daily --Nitroglycerin 0.4 mg sublingual q27min prn for chest pain   Dispo: Anticipated discharge to Home in 2 days pending medical management  Iona Beard, MD 01/29/2020, 10:58 AM Pager: [phone]  Please contact the on  call pager after 5 pm and on weekends at [phone].

## 2020-01-29 NOTE — Hospital Course (Addendum)
#  Alcohol withdrawal Mr. Rippeon has a history of alcohol use disorder with multiple hospital admissions for alcohol withdrawal who presented to [facility] on 01/28/2020 with high risk alcohol withdrawal. He was placed on CIWA with ativan as well as a librium taper. He required significant ativan to control his condition over the first 24 hours with gradual improvement of his withdrawal symptoms over the next two days. He was taken off of CIWA with ativan and librium with complete resolution of his withdrawal symptoms, agitation and confusion. He reported being interested in taking naltrexone upon discharge to reduce the likelihood of him returning to alcohol use and possible repeat admission for alcohol withdrawals in the future.  #Generalized weakness Upon resolution of patient's alcohol withdrawal, lactic acidosis and rhabdomyolysis, patient continued to have generalized weakness complicated by functional incontinence. PT and OT evaluated patient and recommended placement in SNF. While awaiting SNF placement for greater than one week, patient had significant inpatient therapy and was determined to be appropriate for discharge home with home health PT and OT.  #Left foot pain Throughout hospitalization, patient reported intermitted mild to moderate pain to the sole of his left foot. Examination was repeatedly unremarkable and imaging revealed no acute fracture or dislocation. Conservative management throughout hospitalization.  #Lactic acidosis Patient initially presented with a lactic acidosis of 5.1. He received IVF with improvement of his lactic acidosis to 1.7. IVF were discontinued as patient was transitioned to a diet as his withdrawal symptoms improved.  #Rhabdomyolysis Patient presented with CK of 999 in the setting of alcohol withdrawal. There was concern for trauma vs. seizure as the underlying etiology for his elevated CK. He received IVF with resolution of his CK to within normal limits (329.)  IVF were discontinued and patient was transitioned to a diet to continue hydration orally.  #HTN Patient initially severely hypertensive on presentation with resolution of his hypertension with initiation of his home antihypertensive regimen.  #Iron Deficiency Anemia Patient with chronic anemia with hemoglobin stable throughout hospitalization. Resumed iron supplementation during hospitalization.  #BPH Patient with BPH managed with flomax. No acute problems regarding this condition during hospitalization. Continued on Flomax.

## 2020-01-29 NOTE — H&P (Addendum)
Date: 01/29/2020               Patient Name:  Dan Henry MRN: [id number]  DOB: [birth year] Age / Sex: 69 y.o., male   PCP: [name], MD         Medical Service: Internal Medicine Teaching Service         Attending Physician: Dr. Evette Doffing, Mallie Mussel, *    First Contact: Dr. Iona Beard, MD Pager: [phone]  Second Contact: Dr. Marianna Payment, DO Pager: [phone]       After Hours (After 5p/  First Contact Pager: [phone]  weekends / holidays): Second Contact Pager: [phone]   Chief Complaint: Tremors/Generalized Weakness   History of Present Illness: Dan Henry is a 69 yo male with PMH of alcohol use disorder w/ previous delirium tremens, CAD, HTN, alcoholic hepatitis, iron deficiency anemia, and benign essential tremors who presenting with tremors, diaphoresis, generalized weakness and abd pain. He states he was in his usual state of health until yesterday morning when he woke up with tremors, malaise and abdominal pain. He mentions drinking a bottle of Heineken yesterday without significant improvement in his symptoms so he came to the ED for evaluation. He mentions similar symptoms intermittently and states last episode was about a month ago. He was noted to be somnolent and tremulous during evaluation and intermittently is unable to answer questions, requiring frequent prompting. On review of systems, he endorses chills but denies any fevers, chest pain, palpitations, dyspnea, nausea, vomiting, diarrhea, constipation, leg pain, or swelling. Also denies focal weakness, numbness, tingling, dysuria, urgency, or frequency.  Chart review reveals multiple admissions with similar presentation over the last year with most recent admission on 11/17/19. Found to be due to alcohol withdrawal. Previously was started on naltrexone by PCP to encourage alcohol cessation but patient continues to endorse alcohol use.   Meds:  Amlodipine 10mg  daily Atorvastatin 40mg  daily Ferrous  sulfate 325mg  daily Lisinopril 10mg  daily Metoprolol tartrate 25mg  twice daily Tamsulosin 0.4mg  daily Nitrolgycerin 0.4mg  sublingual as needed  Allergies: Allergies as of 01/28/2020  . (No Known Allergies)   Past Medical History:  Diagnosis Date  . Alcohol withdrawal delirium ([place]) 02/14/2019  . Hypertension   . Hypokalemia 07/19/2017  . Malnutrition of moderate degree 10/24/2018    Family History: Hypertension in his mother. Unable to provide additional clinically relevant family history  Social History: Lives by himself. Used to have 2 alcoholic drinks daily but has reduced his alcohol use down to 2-3 drinks weekly. Denies tobacco, and illicit substance use.  Review of Systems: A complete ROS was negative except as per HPI.  Physical Exam: Blood pressure (!) 186/111, pulse 90, temperature 99.5 F (37.5 C), temperature source Oral, resp. rate (!) 21, height 5\' 5"  (1.651 m), weight 57 kg, SpO2 100 %.  General: Somnolent elderly man laying in bed. No acute distress. Head: Normocephalic. Atraumatic. ENT: Dry mucous membrane. Tongue is midline. EOMI.  CV: Tachycardic. Regular rhythm. No murmurs, rubs, or gallops. No LE edema Pulmonary: Lungs CTAB. Normal effort. No wheezing or rales. Abdominal: Soft, nontender. Mild distension. Normal bowel sounds. Extremities: Palpable pulses. Normal ROM. Skin: Warm and dry. No obvious rash or lesions. Neuro: A&Ox2. Not oriented to place, states he is at home but knows we are in [place]. No facial droop. Mild intentional tremor. Moves all extremities. Normal sensation. Strength 5/5 in all extremities.  Psych: Normal mood and affect.  EKG: personally reviewed my interpretation is sinus tach with  possible LAE  CT head: No acute intracranial abnormalities. Stable atrophic and chronic small vessel ischemia.  Assessment & Plan by Problem: Active Problems:   Alcohol withdrawal [facility])  Dan Henry is a 69 yo male with PMH of alcohol use  disorder w/ previous delirium tremens, CAD, HTN, alcoholic hepatitis, iron deficiency anemia, and benign essential tremors who presented to the ED symptoms consistent with ETOH withdraw. Currently on CIWA with ativan.  #ETOH Withdraw #Alcohol use disorder Patient w/ hx of consuming 2-4 beers daily with multiple recent hospitalizations for delirium tremens being evaluated after episodes of diaphoresis, abd pain, confusion, generalized weakness and elevated blood pressure. Last drink was Tuesday. On folic acid 1 mg and naltrexone 50 mg at home. Status post 2 L normal saline in the ED. Normal ammonia and ethanol level. Lactate of 5.1 on admission improved to 2.4 status post IV fluid. Will recheck LFTs and start on librium.  --CIWA w/ ativan protocol --IV LR 500 mL bolus x1 dose --IV LR 100 mL/h for 20 hours --Thiamine 100 mg p.o. daily --Folic Acid 1 mg p.o. daily --Multivitamin tablet --Repeat CMP --Mg, Phos --Telemetry --Seizure precautions  #Symptomatic severe hypertension Patient w/ hx of HTN found to have a BP of 210/105 with elevated lactic acid to 5.1 and altered mental status on arrival. He remains hypertensive despite Labetolol IV x 2 and dose of home Metoprolol. Restarting home antihypertensive regimen. --Metoprolol 25 mg twice daily --Lisinopril 10 mg daily --Amlodipine 10 mg --Daily vitals --F/u repeat lactic acid  #Anion gap metabolic acidosis Low bicarb level with elevated anion gap of 19 in the setting of an elevated lactic acid. Likely due to tremors or dehydration in the setting of ETOH use. --IVF resuscitation --F/u repeat lactic acid  --F/u AM CMP  #Alcoholic hepatitis History of alcohol hepatitis with AST/ALT of 134/58 on arrival.  --F/u AM CMP --PT/INR  #Microcytic anemia, chronic Found to have a hemoglobin of 11.5 on admission.  Improved from 8.3 seven months ago. Likely secondary to iron deficiency anemia due to low iron in the past. On ferrous sulfate 325 mg  at home. --Iron and TIBC levels --Ferritin --CBC  #BPH Hx of BPH on Flomax at home. No dysuria.  --Flomax 0.4 mg daily --Monitor for urinary retention  #CAD Patient with a history of angina on nitroglycerin as needed for chest pain at home. --Lipitor 40 mg daily --Nitroglycerin 0.4 mg sublingual q3min prn for chest pain  CODE STATUS: Full code DIET: Regular PPx: Lovenox 40 mg subcu daily  Dispo: Admit patient to Inpatient with expected length of stay greater than 2 midnights.  Signed: Lacinda Axon, MD 01/29/2020, 2:14 AM  Pager: [phone] Internal Medicine Teaching Service After 5pm on weekdays and 1pm on weekends: On Call pager: [phone]

## 2020-01-30 ENCOUNTER — Encounter: Payer: Medicare HMO | Admitting: Internal Medicine

## 2020-01-30 DIAGNOSIS — I2510 Atherosclerotic heart disease of native coronary artery without angina pectoris: Secondary | ICD-10-CM | POA: Diagnosis not present

## 2020-01-30 DIAGNOSIS — M6282 Rhabdomyolysis: Secondary | ICD-10-CM

## 2020-01-30 DIAGNOSIS — K7010 Alcoholic hepatitis without ascites: Secondary | ICD-10-CM | POA: Diagnosis not present

## 2020-01-30 DIAGNOSIS — F10239 Alcohol dependence with withdrawal, unspecified: Secondary | ICD-10-CM | POA: Diagnosis not present

## 2020-01-30 DIAGNOSIS — E872 Acidosis: Secondary | ICD-10-CM | POA: Diagnosis not present

## 2020-01-30 LAB — CBC
HCT: 35.1 % — ABNORMAL LOW (ref 39.0–52.0)
Hemoglobin: 11.1 g/dL — ABNORMAL LOW (ref 13.0–17.0)
MCH: 26.3 pg (ref 26.0–34.0)
MCHC: 31.6 g/dL (ref 30.0–36.0)
MCV: 83.2 fL (ref 80.0–100.0)
Platelets: UNDETERMINED 10*3/uL (ref 150–400)
RBC: 4.22 MIL/uL (ref 4.22–5.81)
RDW: 24 % — ABNORMAL HIGH (ref 11.5–15.5)
WBC: 4.4 10*3/uL (ref 4.0–10.5)
nRBC: 0 % (ref 0.0–0.2)

## 2020-01-30 LAB — COMPREHENSIVE METABOLIC PANEL
ALT: 50 U/L — ABNORMAL HIGH (ref 0–44)
AST: 108 U/L — ABNORMAL HIGH (ref 15–41)
Albumin: 3.8 g/dL (ref 3.5–5.0)
Alkaline Phosphatase: 42 U/L (ref 38–126)
Anion gap: 12 (ref 5–15)
BUN: 6 mg/dL — ABNORMAL LOW (ref 8–23)
CO2: 24 mmol/L (ref 22–32)
Calcium: 8.7 mg/dL — ABNORMAL LOW (ref 8.9–10.3)
Chloride: 102 mmol/L (ref 98–111)
Creatinine, Ser: 0.96 mg/dL (ref 0.61–1.24)
GFR calc Af Amer: >60 mL/min (ref >60)
GFR calc non Af Amer: >60 mL/min (ref >60)
Glucose, Bld: 99 mg/dL (ref 70–99)
Potassium: 3 mmol/L — ABNORMAL LOW (ref 3.5–5.1)
Sodium: 138 mmol/L (ref 135–145)
Total Bilirubin: 1.3 mg/dL — ABNORMAL HIGH (ref 0.3–1.2)
Total Protein: 7 g/dL (ref 6.5–8.1)

## 2020-01-30 LAB — MAGNESIUM: Magnesium: 1.9 mg/dL (ref 1.7–2.4)

## 2020-01-30 MED ORDER — CHLORDIAZEPOXIDE HCL 25 MG PO CAPS: 25.0000 mg | ORAL_CAPSULE | Freq: Every day | ORAL | Status: DC

## 2020-01-30 MED ORDER — POTASSIUM CHLORIDE CRYS ER 20 MEQ PO TBCR
40.0000 meq | EXTENDED_RELEASE_TABLET | Freq: Two times a day (BID) | ORAL | Status: AC
  Administered 2020-01-30 – 2020-01-31 (×2): 40 meq via ORAL
  Filled 2020-01-30 (×2): qty 2

## 2020-01-30 MED ORDER — LACTATED RINGERS IV SOLN: INTRAVENOUS | Status: AC

## 2020-01-30 NOTE — Progress Notes (Signed)
Pt has pulled out  IV the AM, new IV was placed and covered  Pt pulled 2nd IV out . 3rd IV was placed and Mittens on walked in and pt has blood dripping from the mittens. Dr. Curly Rim and waiting for new orders.  Will continue to monitor   Phoebe Sharps, RN

## 2020-01-30 NOTE — Progress Notes (Signed)
HD#1 Subjective:  Overnight Events: Patient given 10 mg of ativan yesterday, per nurse he was more agitated/trying to get out of bed.   Patient was evaluated at bedside this morning. Patient remains somnolent mumbling to himself. States he is feeling well denies abdominal pain, nausea, vomiting, auditory and visual hallucinations.   Objective:  Vital signs in last 24 hours: Vitals:   01/29/20 1915 01/29/20 2102 01/30/20 0219 01/30/20 0353  BP: 127/73 (!) 169/90 (!) 185/96 (!) 164/97  Pulse: 81 98 93 85  Resp:  20 20 17   Temp:  98.5 F (36.9 C) 98 F (36.7 C) 98.4 F (36.9 C)  TempSrc:  Oral Oral Oral  SpO2: 100% 100% 99% 99%  Weight:  58.1 kg    Height:       Supplemental O2: Room Air SpO2: 99 %   Physical Exam:  Physical Exam Constitutional:      General: He is not in acute distress.    Comments: :sommulence  Cardiovascular:     Rate and Rhythm: Normal rate and regular rhythm.     Pulses: Normal pulses.  Abdominal:     General: Abdomen is flat.     Palpations: Abdomen is soft.  Neurological:     Comments: Intention tremor, difficult maintaining attention to conversation.     Filed Weights   01/28/20 1311 01/29/20 2102  Weight: 57 kg 58.1 kg     Intake/Output Summary (Last 24 hours) at 01/30/2020 0547 Last data filed at 01/29/2020 2119 Gross per 24 hour  Intake 1361.55 ml  Output --  Net 1361.55 ml   Net IO Since Admission: 2,861.55 mL [01/30/20 0547]  Pertinent Labs: CBC Latest Ref Rng & Units 01/30/2020 01/29/2020 01/28/2020  WBC 4.0 - 10.5 K/uL 4.4 4.1 6.1  Hemoglobin 13.0 - 17.0 g/dL 11.1(L) 11.7(L) 11.5(L)  Hematocrit 39 - 52 % 35.1(L) 35.9(L) 36.0(L)  Platelets 150 - 400 K/uL PLATELET CLUMPS NOTED ON SMEAR, UNABLE TO ESTIMATE PLATELET CLUMPING, SUGGEST RECOLLECTION OF SAMPLE IN CITRATE TUBE. PLATELET CLUMPS NOTED ON SMEAR, UNABLE TO ESTIMATE    CMP Latest Ref Rng & Units 01/30/2020 01/29/2020 01/28/2020  Glucose 70 - 99 mg/dL 99 103(H) 204(H)   BUN 8 - 23 mg/dL 6(L) 9 14  Creatinine 0.61 - 1.24 mg/dL 0.96 0.82 1.21  Sodium 135 - 145 mmol/L 138 138 139  Potassium 3.5 - 5.1 mmol/L 3.0(L) 3.6 3.8  Chloride 98 - 111 mmol/L 102 101 102  CO2 22 - 32 mmol/L 24 23 18(L)  Calcium 8.9 - 10.3 mg/dL 8.7(L) 8.7(L) 9.2  Total Protein 6.5 - 8.1 g/dL 7.0 7.4 8.2(H)  Total Bilirubin 0.3 - 1.2 mg/dL 1.3(H) 1.2 1.1  Alkaline Phos 38 - 126 U/L 42 46 56  AST 15 - 41 U/L 108(H) 86(H) 134(H)  ALT 0 - 44 U/L 50(H) 44 58(H)    Imaging: No results found.  Assessment/Plan:   Principal Problem:   Alcohol withdrawal ([place]) Active Problems:   Essential hypertension   Lactic acidosis   Alcohol use disorder, severe, in early remission [facility])   Patient Summary: [name] with PMH of alcohol use disorder,CAD,HTN,alcoholic hepatitis, iron deficiency anemia,and benign essentialtremors who presented to the ED symptoms consistent with ETOH withdraw and admitted for lactic acidosis and alcohol withdrawal.   #ETOH Withdrawl #Alcohol use disorder Patient continues to be somnolent but comfortable, denies hallucinations. On exam patient is lethargic on exam. CK 999. Will decrease his librium as he continues to  seem sedated. --CIWAw/ ativan protocol -- Librium decreased to 25 mg daily --Thiamine. Folic Acid, Multivitamin  --CMP in AM --Telemetry --Seizure precautions  #Hypertension  BP this morning is 160/90. Per nurse patient was more agitated whit morning. Resumed home medications. Continue to monitor. --Metoprolol 25 mg twice daily, Lisinopril 10 mg daily, Amlodipine 10 mg  #Microcytic anemia, chronic Hgb stable 11.1 today. Likelysecondary toiron deficiency anemiadue to low iron in the past.  Ferritin 45 Iron  94 TIBC 374 saturation 25% appear improved from 9 months ago. Onferrous sulfate 325 mgat home. --CBC in am  #BPH  -- continue Flomax 0.4 mg daily  #CAD Patient with a history of angina on  nitroglycerin as needed for chest pain at home. No chest pain today. --Lipitor 40 mg daily --Nitroglycerin 0.4 mg sublingualq1min prn forchest pain  Dispo: Anticipated discharge to Home in 2 days pending medical management  Iona Beard, MD 01/30/2020, 5:47 AM Pager: [phone]  Please contact the on call pager after 5 pm and on weekends at [phone].

## 2020-01-31 DIAGNOSIS — K7010 Alcoholic hepatitis without ascites: Secondary | ICD-10-CM | POA: Diagnosis not present

## 2020-01-31 DIAGNOSIS — E872 Acidosis: Secondary | ICD-10-CM | POA: Diagnosis not present

## 2020-01-31 DIAGNOSIS — E876 Hypokalemia: Secondary | ICD-10-CM

## 2020-01-31 DIAGNOSIS — I2510 Atherosclerotic heart disease of native coronary artery without angina pectoris: Secondary | ICD-10-CM | POA: Diagnosis not present

## 2020-01-31 DIAGNOSIS — F10239 Alcohol dependence with withdrawal, unspecified: Secondary | ICD-10-CM | POA: Diagnosis not present

## 2020-01-31 LAB — CBC
HCT: 37.3 % — ABNORMAL LOW (ref 39.0–52.0)
Hemoglobin: 12.4 g/dL — ABNORMAL LOW (ref 13.0–17.0)
MCH: 27.5 pg (ref 26.0–34.0)
MCHC: 33.2 g/dL (ref 30.0–36.0)
MCV: 82.7 fL (ref 80.0–100.0)
Platelets: 77 10*3/uL — ABNORMAL LOW (ref 150–400)
RBC: 4.51 MIL/uL (ref 4.22–5.81)
RDW: 23.1 % — ABNORMAL HIGH (ref 11.5–15.5)
WBC: 5.5 10*3/uL (ref 4.0–10.5)
nRBC: 0 % (ref 0.0–0.2)

## 2020-01-31 LAB — BASIC METABOLIC PANEL
Anion gap: 15 (ref 5–15)
BUN: 7 mg/dL — ABNORMAL LOW (ref 8–23)
CO2: 22 mmol/L (ref 22–32)
Calcium: 9.1 mg/dL (ref 8.9–10.3)
Chloride: 100 mmol/L (ref 98–111)
Creatinine, Ser: 0.9 mg/dL (ref 0.61–1.24)
GFR calc Af Amer: >60 mL/min (ref >60)
GFR calc non Af Amer: >60 mL/min (ref >60)
Glucose, Bld: 92 mg/dL (ref 70–99)
Potassium: 2.8 mmol/L — ABNORMAL LOW (ref 3.5–5.1)
Sodium: 137 mmol/L (ref 135–145)

## 2020-01-31 LAB — CK: Total CK: 566 U/L — ABNORMAL HIGH (ref 49–397)

## 2020-01-31 LAB — PHOSPHORUS: Phosphorus: 3.5 mg/dL (ref 2.5–4.6)

## 2020-01-31 LAB — MAGNESIUM: Magnesium: 1.7 mg/dL (ref 1.7–2.4)

## 2020-01-31 MED ORDER — POTASSIUM CHLORIDE CRYS ER 20 MEQ PO TBCR
40.0000 meq | EXTENDED_RELEASE_TABLET | Freq: Once | ORAL | Status: AC
  Administered 2020-01-31: 40 meq via ORAL
  Filled 2020-01-31: qty 2

## 2020-01-31 MED ORDER — POTASSIUM CHLORIDE CRYS ER 20 MEQ PO TBCR
40.0000 meq | EXTENDED_RELEASE_TABLET | Freq: Two times a day (BID) | ORAL | Status: AC
  Administered 2020-01-31: 40 meq via ORAL
  Filled 2020-01-31: qty 2

## 2020-01-31 MED ORDER — MAGNESIUM SULFATE 2 GM/50ML IV SOLN
2.0000 g | Freq: Once | INTRAVENOUS | Status: AC
  Administered 2020-01-31: 2 g via INTRAVENOUS
  Filled 2020-01-31: qty 50

## 2020-01-31 MED ORDER — NALTREXONE HCL 50 MG PO TABS
50.0000 mg | ORAL_TABLET | Freq: Every day | ORAL | 0 refills | Status: DC
Start: 2020-01-31 — End: 2020-01-31

## 2020-01-31 MED ORDER — POTASSIUM CHLORIDE CRYS ER 20 MEQ PO TBCR: 40.0000 meq | EXTENDED_RELEASE_TABLET | Freq: Two times a day (BID) | ORAL | Status: DC

## 2020-01-31 NOTE — Evaluation (Signed)
Physical Therapy Evaluation Patient Details Name: Dan Henry MRN: [id number] DOB: [birth year] Today's Date: 01/31/2020   History of Present Illness  Pt is a 69 y/o male admitted secondary to alcohol withdrawal. Pt also with alcoholic hepatitis. PMH includes alcohol abuse, HTN, CAD.  Clinical Impression  Pt admitted secondary to problem above with deficits below. Required mod to max A for bed mobility tasks and total A to stand. Pt with heavy posterior lean. Required mod A to maintain sitting balance. Pt also with slow processing and poor sequencing. Pt currently lives alone and does not have necessary assist. Feel he would benefit from SNF level therapies prior to return home. Will continue to follow acutely to maximize functional mobility independence and safety.     Follow Up Recommendations SNF;Supervision/Assistance - 24 hour    Equipment Recommendations  Wheelchair cushion (measurements PT);Wheelchair (measurements PT)    Recommendations for Other Services       Precautions / Restrictions Precautions Precautions: Fall Restrictions Weight Bearing Restrictions: No      Mobility  Bed Mobility Overal bed mobility: Needs Assistance Bed Mobility: Supine to Sit;Sit to Supine     Supine to sit: Max assist Sit to supine: Mod assist   General bed mobility comments: Max A for trunk assist and scooting hips to EOB to come to sitting. Pt with posterior lean and reliant on mod A to maintain. Mod A for LE assist for return to supine.   Transfers Overall transfer level: Needs assistance Equipment used: None Transfers: Sit to/from Stand Sit to Stand: Total assist         General transfer comment: total A to stand. Pt with heavy posterior lean.   Ambulation/Gait                Stairs            Wheelchair Mobility    Modified Rankin (Stroke Patients Only)       Balance Overall balance assessment: Needs assistance Sitting-balance support: Bilateral upper  extremity supported;Feet supported Sitting balance-Leahy Scale: Poor Sitting balance - Comments: reliant on mod A to maintain sitting balance secondary to posterior lean Postural control: Posterior lean Standing balance support: Bilateral upper extremity supported;During functional activity Standing balance-Leahy Scale: Zero Standing balance comment: total A to stand. Pt pushing heavily posteriorly                             Pertinent Vitals/Pain Pain Assessment: No/denies pain    Home Living Family/patient expects to be discharged to:: Private residence Living Arrangements: Alone Available Help at Discharge: Family;Available PRN/intermittently Type of Home: House Home Access: Level entry     Home Layout: One level Home Equipment: Cane - single point Additional Comments: Home info from previous note as pt unable to report    Prior Function Level of Independence: Independent with assistive device(s)         Comments: Pt reports using cane for ambulation, but otherwise independent. Unsure of accuracy given cognitive deficits.      Hand Dominance        Extremity/Trunk Assessment   Upper Extremity Assessment Upper Extremity Assessment: Defer to OT evaluation    Lower Extremity Assessment Lower Extremity Assessment: Generalized weakness    Cervical / Trunk Assessment Cervical / Trunk Assessment: Kyphotic  Communication   Communication: Expressive difficulties  Cognition Arousal/Alertness: Awake/alert Behavior During Therapy: Flat affect Overall Cognitive Status: No family/caregiver present to determine baseline cognitive functioning  General Comments: Pt with slow processing and difficulty sequencing. Pt occasionally responding to cues, but other times did not.       General Comments      Exercises     Assessment/Plan    PT Assessment Patient needs continued PT services  PT Problem List Decreased  strength;Decreased balance;Decreased activity tolerance;Decreased mobility;Decreased cognition;Decreased knowledge of use of DME;Decreased safety awareness;Decreased knowledge of precautions       PT Treatment Interventions DME instruction;Gait training;Functional mobility training;Therapeutic exercise;Therapeutic activities;Balance training;Patient/family education    PT Goals (Current goals can be found in the Care Plan section)  Acute Rehab PT Goals PT Goal Formulation: Patient unable to participate in goal setting Time For Goal Achievement: 02/14/20 Potential to Achieve Goals: Fair    Frequency Min 2X/week   Barriers to discharge Decreased caregiver support      Co-evaluation               AM-PAC PT "6 Clicks" Mobility  Outcome Measure Help needed turning from your back to your side while in a flat bed without using bedrails?: A Lot Help needed moving from lying on your back to sitting on the side of a flat bed without using bedrails?: A Lot Help needed moving to and from a bed to a chair (including a wheelchair)?: Total Help needed standing up from a chair using your arms (e.g., wheelchair or bedside chair)?: Total Help needed to walk in hospital room?: Total Help needed climbing 3-5 steps with a railing? : Total 6 Click Score: 8    End of Session Equipment Utilized During Treatment: Gait belt Activity Tolerance: Patient tolerated treatment well Patient left: in bed;with call bell/phone within reach;with bed alarm set Nurse Communication: Mobility status PT Visit Diagnosis: Unsteadiness on feet (R26.81);Muscle weakness (generalized) (M62.81);Difficulty in walking, not elsewhere classified (R26.2)    Time: 4888-9169 PT Time Calculation (min) (ACUTE ONLY): 12 min   Charges:   PT Evaluation $PT Eval Moderate Complexity: 1 Mod          Reuel Derby, PT, DPT  Acute Rehabilitation Services  Pager: [phone] Office: [phone]   Rudean Hitt 01/31/2020, 6:00 PM

## 2020-01-31 NOTE — Progress Notes (Addendum)
Subjective: [name] is a 69 year old man with past medical history of alcohol use disorder, CAD, HTN, alcoholic hepatitis, iron deficiency anemia, and benign essential tremors who presented to the ED on 01/28/20 found to have high-risk alcohol withdrawal and lactic acidosis.  Yesterday afternoon, patient reportedly pulled out two IVs and placed in mittens. Safety precuations of 1 to 1 observation ordered for twelve hours. Received 8mg  ativan in last twelve hours per CIWA protocol.   This morning, patient reported that he felt comfortable and had no complaints. He reports that he feels well and desires to go home. He understands the reason for his hospitalization and the treatment that we have given him. He states that he would be interested in starting naltrexone upon discharge.  Objective:  Vital signs in last 24 hours: Vitals:   01/31/20 0700 01/31/20 0916  BP: 138/88 (!) 154/90  Pulse: 90   Resp: 15   Temp: 98.5 F (36.9 C)   SpO2: 100%    Filed Weights   01/28/20 1311 01/29/20 2102 01/31/20 0337  Weight: 57 kg 58.1 kg 57.2 kg    Intake/Output Summary (Last 24 hours) at 01/31/2020 1050 Last data filed at 01/31/2020 0305 Gross per 24 hour  Intake 248.52 ml  Output 1150 ml  Net -901.48 ml   Physical Exam Constitutional:      General: He is not in acute distress.    Appearance: Normal appearance.  HENT:     Head: Normocephalic and atraumatic.     Mouth/Throat:     Mouth: Mucous membranes are dry.     Pharynx: Oropharynx is clear.  Eyes:     Extraocular Movements: Extraocular movements intact.     Conjunctiva/sclera: Conjunctivae normal.  Cardiovascular:     Rate and Rhythm: Normal rate and regular rhythm.     Pulses: Normal pulses.     Heart sounds: Normal heart sounds.  Pulmonary:     Effort: Pulmonary effort is normal.     Breath sounds: Normal breath sounds.  Abdominal:     General: Abdomen is flat. Bowel sounds are normal.     Palpations: Abdomen is  soft.     Tenderness: There is no abdominal tenderness.  Musculoskeletal:        General: No swelling. Normal range of motion.     Cervical back: Normal range of motion and neck supple.     Right lower leg: No edema.     Left lower leg: No edema.  Skin:    General: Skin is warm and dry.     Capillary Refill: Capillary refill takes less than 2 seconds.  Neurological:     General: No focal deficit present.     Mental Status: He is alert and oriented to person, place, and time. Mental status is at baseline.  Psychiatric:        Mood and Affect: Mood normal.        Behavior: Behavior normal.        Thought Content: Thought content normal.        Judgment: Judgment normal.    Labs:   BMP Latest Ref Rng & Units 01/31/2020 01/30/2020 01/29/2020  Glucose 70 - 99 mg/dL 92 99 103(H)  BUN 8 - 23 mg/dL 7(L) 6(L) 9  Creatinine 0.61 - 1.24 mg/dL 0.90 0.96 0.82  BUN/Creat Ratio 10 - 24 - - -  Sodium 135 - 145 mmol/L 137 138 138  Potassium 3.5 - 5.1 mmol/L 2.8(L) 3.0(L) 3.6  Chloride 98 -  111 mmol/L 100 102 101  CO2 22 - 32 mmol/L 22 24 23   Calcium 8.9 - 10.3 mg/dL 9.1 8.7(L) 8.7(L)   CBC CBC Latest Ref Rng & Units 01/31/2020 01/30/2020 01/29/2020  WBC 4.0 - 10.5 K/uL 5.5 4.4 4.1  Hemoglobin 13.0 - 17.0 g/dL 12.4(L) 11.1(L) 11.7(L)  Hematocrit 39 - 52 % 37.3(L) 35.1(L) 35.9(L)  Platelets 150 - 400 K/uL 77(L) PLATELET CLUMPS NOTED ON SMEAR, UNABLE TO ESTIMATE PLATELET CLUMPING, SUGGEST RECOLLECTION OF SAMPLE IN CITRATE TUBE.   Imaging: No new imaging  Assessment/Plan:  Principal Problem:   Alcohol withdrawal ([place]) Active Problems:   Essential hypertension   Lactic acidosis   Alcohol use disorder, severe, in early remission [facility])   Rhabdomyolysis  [name] with PMH of alcohol use disorder,CAD,HTN,alcoholic hepatitis, iron deficiency anemia,and benign essentialtremors who presented to the ED symptoms consistent with ETOH withdrawand admitted forlactic  acidosis and alcohol withdrawal.  #High risk alcohol withdrawal Patient has had significant improvement in his mentation and somnolence following CIWA protocol with ativan and librium taper. He has received 8mg  ativan in the past 12 hours and is currently on Librium 25mg  daily. On examination this morning, he reports having a clear understanding for his condition and the reasons for his hospitalization. He is no longer experiencing hallucinations or abnormal behavior. He desires to go home with naltrexone to prevent returning to prior alcohol consumption.  -CIWA with ativan protocol -Discontinue librium -Thiamine, folic acid, multivitamin supplementation -Telemetry -Seizure precautions -Discharge home with naltrexone  #Moderate rhabdomyolysis Patient presented with CK elevated to 999 consistent with rhabdomyolysis secondary to trauma vs. Seizure in the setting of his alcohol withdrawal. He received LR 157mL/hr with improvement in his condition. Due to patient's request to go home and clinical improvement, we will discontinue IV fluids at this time and not trend CK further. -Discontinue IVF  #Hypokalemia Potassium 2.8 on morning BMP. Hypokalemia possibly secondary to refeeding syndrome, however morning magnesium and phosphorous are within normal limits.  -50meq potassium chloride supplementation  #HTN, chronic BP elevated, likely secondary to current condition. -Continue home medication regimen (metoprolol 25mg  twice daily, lisinopril 10mg  daily, amlodipine 10mg  daily)  #Microcytic anemia, chronic Hgb stable at 12.4 up from 11.1 yesterday. Likelysecondary toiron deficiency anemiadue to low iron in the past.  Ferritin 45 Iron  94 TIBC 374 saturation 25% appear improved from 9 months ago. Onferrous sulfate 325 mgat home. -Continue to monitor  #BPH, chronic  -Continue Flomax 0.4 mg daily  #CAD Patient with a history of angina on nitroglycerin as needed for chest pain at home. No  chest pain today. --Nitroglycerin 0.4 mg sublingualq69min prn forchest pain  Cato Mulligan, MD 01/31/2020, 10:50 AM Pager: [phone] After 5pm on weekdays and 1pm on weekends: On Call pager [phone]

## 2020-01-31 NOTE — Progress Notes (Signed)
Mobility Specialist - Progress Note   01/31/20 1337  Mobility  Activity Ambulated in room  Level of Assistance +2 (takes two people)  Assistive Device Front wheel walker  Distance Ambulated (ft) 12 ft  Mobility Response Tolerated fair  Mobility performed by Mobility specialist;Nurse  $Mobility charge 1 Mobility    Pre-mobility: 106 HR During mobility: 113 HR Post-mobility: 87 HR  Saw pt at request RN. Pt required mod assist for bed mobility and +2 assistance for stability and safety when ambulating.   Pricilla Handler Mobility Specialist Mobility Specialist Phone: [phone]

## 2020-02-01 DIAGNOSIS — F10239 Alcohol dependence with withdrawal, unspecified: Secondary | ICD-10-CM | POA: Diagnosis not present

## 2020-02-01 DIAGNOSIS — I2510 Atherosclerotic heart disease of native coronary artery without angina pectoris: Secondary | ICD-10-CM | POA: Diagnosis not present

## 2020-02-01 DIAGNOSIS — E872 Acidosis: Secondary | ICD-10-CM | POA: Diagnosis not present

## 2020-02-01 DIAGNOSIS — K7010 Alcoholic hepatitis without ascites: Secondary | ICD-10-CM | POA: Diagnosis not present

## 2020-02-01 LAB — COMPREHENSIVE METABOLIC PANEL
ALT: 54 U/L — ABNORMAL HIGH (ref 0–44)
AST: 77 U/L — ABNORMAL HIGH (ref 15–41)
Albumin: 3.8 g/dL (ref 3.5–5.0)
Alkaline Phosphatase: 43 U/L (ref 38–126)
Anion gap: 12 (ref 5–15)
BUN: 10 mg/dL (ref 8–23)
CO2: 21 mmol/L — ABNORMAL LOW (ref 22–32)
Calcium: 9.3 mg/dL (ref 8.9–10.3)
Chloride: 105 mmol/L (ref 98–111)
Creatinine, Ser: 1.07 mg/dL (ref 0.61–1.24)
GFR calc Af Amer: >60 mL/min (ref >60)
GFR calc non Af Amer: >60 mL/min (ref >60)
Glucose, Bld: 102 mg/dL — ABNORMAL HIGH (ref 70–99)
Potassium: 3.4 mmol/L — ABNORMAL LOW (ref 3.5–5.1)
Sodium: 138 mmol/L (ref 135–145)
Total Bilirubin: 1 mg/dL (ref 0.3–1.2)
Total Protein: 7.2 g/dL (ref 6.5–8.1)

## 2020-02-01 LAB — PHOSPHORUS: Phosphorus: 3.2 mg/dL (ref 2.5–4.6)

## 2020-02-01 LAB — MAGNESIUM: Magnesium: 2.2 mg/dL (ref 1.7–2.4)

## 2020-02-01 LAB — CBC
HCT: 36.6 % — ABNORMAL LOW (ref 39.0–52.0)
Hemoglobin: 11.9 g/dL — ABNORMAL LOW (ref 13.0–17.0)
MCH: 26.8 pg (ref 26.0–34.0)
MCHC: 32.5 g/dL (ref 30.0–36.0)
MCV: 82.4 fL (ref 80.0–100.0)
Platelets: UNDETERMINED 10*3/uL (ref 150–400)
RBC: 4.44 MIL/uL (ref 4.22–5.81)
RDW: 23.3 % — ABNORMAL HIGH (ref 11.5–15.5)
WBC: 5.5 10*3/uL (ref 4.0–10.5)
nRBC: 0 % (ref 0.0–0.2)

## 2020-02-01 LAB — CK: Total CK: 555 U/L — ABNORMAL HIGH (ref 49–397)

## 2020-02-01 MED ORDER — LACTATED RINGERS IV SOLN: INTRAVENOUS | Status: DC

## 2020-02-01 MED ORDER — POTASSIUM CHLORIDE 2 MEQ/ML IV SOLN
INTRAVENOUS | Status: DC
  Filled 2020-02-01 (×3): qty 1000

## 2020-02-01 MED ORDER — HYDROXYZINE HCL 25 MG PO TABS: 25.0000 mg | ORAL_TABLET | Freq: Four times a day (QID) | ORAL | Status: AC | PRN

## 2020-02-01 MED ORDER — LORAZEPAM 1 MG PO TABS: 1.0000 mg | ORAL_TABLET | Freq: Four times a day (QID) | ORAL | Status: DC | PRN

## 2020-02-01 NOTE — Progress Notes (Addendum)
Subjective: [name] is a 69 year old man with past medical history of alcohol use disorder, CAD, HTN, alcoholic hepatitis, iron deficiency anemia, and benign essential tremors who presented to the ED on 01/28/20 found to have high-risk alcohol withdrawal and lactic acidosis.  Patient has no specific concerns this morning, he is agreeable to waiting for SNF placement.  He would like to regain his strength and sources this being the best plan.  He denies any feelings of withdrawal.  Objective:  Vital signs in last 24 hours: Vitals:   01/31/20 2327 02/01/20 0527  BP: 135/77 115/72  Pulse: 82 77  Resp: 14 10  Temp: 97.8 F (36.6 C) 98.3 F (36.8 C)  SpO2: 94% 100%   Filed Weights   01/29/20 2102 01/31/20 0337 02/01/20 0527  Weight: 58.1 kg 57.2 kg 56.1 kg    Intake/Output Summary (Last 24 hours) at 02/01/2020 0643 Last data filed at 02/01/2020 0346 Gross per 24 hour  Intake 632.16 ml  Output 600 ml  Net 32.16 ml    General: NAD, nl appearance Cardiovascular: Tachycardic, regular rhythm.  No murmurs or rubs Pulmonary : Effort normal, breath sounds  Abdominal: soft, nontender,  bowel sounds present Musculoskeletal: no swelling , deformity Skin: Warm, dry   Labs:   BMP Latest Ref Rng & Units 02/01/2020 01/31/2020 01/30/2020  Glucose 70 - 99 mg/dL 102(H) 92 99  BUN 8 - 23 mg/dL 10 7(L) 6(L)  Creatinine 0.61 - 1.24 mg/dL 1.07 0.90 0.96  BUN/Creat Ratio 10 - 24 - - -  Sodium 135 - 145 mmol/L 138 137 138  Potassium 3.5 - 5.1 mmol/L 3.4(L) 2.8(L) 3.0(L)  Chloride 98 - 111 mmol/L 105 100 102  CO2 22 - 32 mmol/L 21(L) 22 24  Calcium 8.9 - 10.3 mg/dL 9.3 9.1 8.7(L)   CBC CBC Latest Ref Rng & Units 02/01/2020 01/31/2020 01/30/2020  WBC 4.0 - 10.5 K/uL 5.5 5.5 4.4  Hemoglobin 13.0 - 17.0 g/dL 11.9(L) 12.4(L) 11.1(L)  Hematocrit 39 - 52 % 36.6(L) 37.3(L) 35.1(L)  Platelets 150 - 400 K/uL PLATELET CLUMPS NOTED ON SMEAR, UNABLE TO ESTIMATE 77(L) PLATELET CLUMPS NOTED ON  SMEAR, UNABLE TO ESTIMATE   Imaging: No new imaging  Assessment/Plan:  Principal Problem:   Alcohol withdrawal ([facility]) Active Problems:   Essential hypertension   Lactic acidosis   Alcohol use disorder, severe, in early remission [facility])   Rhabdomyolysis  [name] with PMH of alcohol use disorder, CAD, HTN, alcoholic hepatitis, iron deficiency anemia, and benign essential tremors who presented to the ED symptoms consistent with ETOH withdraw and admitted for lactic acidosis and alcohol withdrawal.  #High risk alcohol withdrawal Patient is cooperative on exam. He has not required any Ativan in the last 12 hours.  He is slightly tachycardic on exam but denies any feelings of withdrawal.  AST and ALT trending down. He was admitted 3 days ago and still with in a window of expected withdrawal , so we will continue CIWA with Ativan today. Tapered off of librium. We discussed getting him out of bed today with RN.   -CIWA with ativan protocol -Thiamine, folic acid, multivitamin supplementation -Telemetry -Seizure precautions -Plan to discharge with naltrexone   #Moderate rhabdomyolysis We will continue LR for rhabdomyolysis.  CK 555 Gave LR with potassium this morning, will continue LR at 100 mL/h for 24 hours  #Hypokalemia Potassium 3.4 on morning BMP. -64meq potassium in LR  #HTN, chronic Normotensive this morning on home  regimen -Continue home medication regimen (metoprolol 25mg  twice daily, lisinopril 10mg  daily, amlodipine 10mg  daily)  #Microcytic anemia, chronic Hgb stable , on Iron supplementation at home. -Continue to monitor   #BPH, chronic  -Continue Flomax 0.4 mg daily   #CAD Patient with a history of angina on nitroglycerin as needed for chest pain at home. No chest pain today. --Nitroglycerin 0.4 mg sublingual q68min prn for chest pain  Madalyn Rob, MD 02/01/2020, 6:43 AM Pager: [phone] After 5pm on weekdays and 1pm on weekends: On Call  pager [phone]

## 2020-02-01 NOTE — Progress Notes (Signed)
Mobility Specialist - Progress Note   02/01/20 1335  Mobility  Activity Refused mobility   Asked by RN to get pt in chair, pt persistently refused.   Pricilla Handler Mobility Specialist Mobility Specialist Phone: [phone]

## 2020-02-01 NOTE — [place] FL2 (Signed)
[place] MEDICAID [id number] LEVEL OF CARE SCREENING TOOL     IDENTIFICATION  Patient Name: Dan Henry Birthdate: 05-Apr-1951 Sex: male Admission Date (Current Location): 01/28/2020  [facility] and [place] Number:  Herbalist and Address:  [facility] [place], [place] [address], [place], [place] [address]      Provider Number: [id number]  Attending Physician Name and Address:  Axel Filler, *  Relative Name and Phone Number:       Current Level of Care: Hospital Recommended Level of Care: [place] Prior Approval Number:    Date Approved/Denied:   PASRR Number: [id number]  Discharge Plan: SNF    Current Diagnoses: Patient Active Problem List   Diagnosis Date Noted  . Iron deficiency anemia 11/22/2019  . Syncope 11/18/2019  . Alcohol intoxication ([place]) 11/18/2019  . Stable angina ([place]) 05/08/2019  . Alcoholic hepatitis   . Nosebleed 04/23/2019  . Alcohol withdrawal ([place]) 04/23/2019  . Anemia 02/26/2019  . Rhabdomyolysis 10/26/2018  . Urinary retention 10/25/2018  . Alcohol use disorder, severe, in early remission ([place]) 10/23/2018  . Hepatic hemangioma 10/19/2018  . Visual hallucinations 06/26/2018  . Abnormal TSH 06/26/2018  . Need for hepatitis B vaccination 02/21/2018  . Lactic acidosis 02/20/2018  . Unintentional weight loss 02/16/2018  . Benign essential tremor 07/19/2017  . Elevated LFTs 05/16/2017  . ASCVD (arteriosclerotic cardiovascular disease) 05/16/2017  . Right carotid bruit 05/08/2017  . Essential hypertension 05/21/2015  . Healthcare maintenance 05/21/2015    Orientation RESPIRATION BLADDER Height & Weight     Self (fluctuating within defined limits)  Normal Incontinent Weight: 123 lb 10.9 oz (56.1 kg) Height:  5\' 5"  (165.1 cm)  BEHAVIORAL SYMPTOMS/MOOD NEUROLOGICAL BOWEL NUTRITION STATUS      Continent Diet (see discharge summary)  AMBULATORY STATUS COMMUNICATION OF NEEDS Skin   Extensive Assist  Verbally Normal                       Personal Care Assistance Level of Assistance  Bathing, Dressing, Feeding Bathing Assistance: Maximum assistance Feeding assistance: Independent Dressing Assistance: Maximum assistance     Functional Limitations Info  Sight, Hearing, Speech Sight Info: Adequate Hearing Info: Adequate Speech Info: Adequate    SPECIAL CARE FACTORS FREQUENCY  PT (By licensed PT), OT (By licensed OT)     PT Frequency: 5x week OT Frequency: 5x week            Contractures Contractures Info: Not present    Additional Factors Info  Code Status, Allergies Code Status Info: Full Code Allergies Info: No Known Allergies           Current Medications (02/01/2020):  This is the current hospital active medication list Current Facility-Administered Medications  Medication Dose Route Frequency Provider Last Rate Last Admin  . acetaminophen (TYLENOL) tablet 650 mg  650 mg Oral Q4H PRN [name], MD       Or  . acetaminophen (TYLENOL) suppository 650 mg  650 mg Rectal Q4H PRN [name], MD      . amLODipine (NORVASC) tablet 10 mg  10 mg Oral Daily [name], MD   10 mg at 01/31/20 0916  . enoxaparin (LOVENOX) injection 40 mg  40 mg Subcutaneous Q24H [name], MD   40 mg at 01/31/20 0916  . folic acid (FOLVITE) tablet 1 mg  1 mg Oral Daily [name], MD   1 mg at 01/31/20 0916  .  lactated ringers 1,000 mL with potassium chloride 40 mEq infusion   Intravenous Continuous Madalyn Rob, MD      . lisinopril (ZESTRIL) tablet 10 mg  10 mg Oral Daily [name], MD   10 mg at 01/31/20 0916  . metoprolol tartrate (LOPRESSOR) tablet 25 mg  25 mg Oral BID [name], MD   25 mg at 01/31/20 2100  . multivitamin with minerals tablet 1 tablet  1 tablet Oral Daily [name], MD   1 tablet at 01/31/20 0916  . nitroGLYCERIN (NITROSTAT) SL tablet 0.4 mg  0.4 mg Sublingual Q5 min PRN [name], MD      . ondansetron [facility]) tablet 4 mg  4 mg  Oral Q6H PRN [name], MD       Or  . ondansetron [facility]) injection 4 mg  4 mg Intravenous Q6H PRN [name], MD      . polyethylene glycol (MIRALAX / GLYCOLAX) packet 17 g  17 g Oral Daily PRN [name], MD      . tamsulosin [facility]) capsule 0.4 mg  0.4 mg Oral Daily [name], MD   0.4 mg at 01/31/20 0916  . thiamine tablet 100 mg  100 mg Oral Daily [name], MD   100 mg at 01/31/20 9381   Or  . thiamine (B-1) injection 100 mg  100 mg Intravenous Daily [name], MD         Discharge Medications: Please see discharge summary for a list of discharge medications.  Relevant Imaging Results:  Relevant Lab Results:   Additional Information SS#[id number]  [place], [place]

## 2020-02-02 LAB — COMPREHENSIVE METABOLIC PANEL
ALT: 51 U/L — ABNORMAL HIGH (ref 0–44)
AST: 66 U/L — ABNORMAL HIGH (ref 15–41)
Albumin: 3.3 g/dL — ABNORMAL LOW (ref 3.5–5.0)
Alkaline Phosphatase: 38 U/L (ref 38–126)
Anion gap: 9 (ref 5–15)
BUN: 15 mg/dL (ref 8–23)
CO2: 22 mmol/L (ref 22–32)
Calcium: 8.9 mg/dL (ref 8.9–10.3)
Chloride: 105 mmol/L (ref 98–111)
Creatinine, Ser: 0.93 mg/dL (ref 0.61–1.24)
GFR calc Af Amer: >60 mL/min (ref >60)
GFR calc non Af Amer: >60 mL/min (ref >60)
Glucose, Bld: 94 mg/dL (ref 70–99)
Potassium: 3.8 mmol/L (ref 3.5–5.1)
Sodium: 136 mmol/L (ref 135–145)
Total Bilirubin: 0.9 mg/dL (ref 0.3–1.2)
Total Protein: 6.6 g/dL (ref 6.5–8.1)

## 2020-02-02 LAB — CBC WITH DIFFERENTIAL/PLATELET
Abs Immature Granulocytes: 0.03 10*3/uL (ref 0.00–0.07)
Basophils Absolute: 0.1 10*3/uL (ref 0.0–0.1)
Basophils Relative: 1 %
Eosinophils Absolute: 0.2 10*3/uL (ref 0.0–0.5)
Eosinophils Relative: 3 %
HCT: 35.1 % — ABNORMAL LOW (ref 39.0–52.0)
Hemoglobin: 11.3 g/dL — ABNORMAL LOW (ref 13.0–17.0)
Immature Granulocytes: 1 %
Lymphocytes Relative: 16 %
Lymphs Abs: 1 10*3/uL (ref 0.7–4.0)
MCH: 27 pg (ref 26.0–34.0)
MCHC: 32.2 g/dL (ref 30.0–36.0)
MCV: 84 fL (ref 80.0–100.0)
Monocytes Absolute: 0.8 10*3/uL (ref 0.1–1.0)
Monocytes Relative: 12 %
Neutro Abs: 4.1 10*3/uL (ref 1.7–7.7)
Neutrophils Relative %: 67 %
Platelets: UNDETERMINED 10*3/uL (ref 150–400)
RBC: 4.18 MIL/uL — ABNORMAL LOW (ref 4.22–5.81)
RDW: 23.2 % — ABNORMAL HIGH (ref 11.5–15.5)
WBC: 6.1 10*3/uL (ref 4.0–10.5)
nRBC: 0 % (ref 0.0–0.2)

## 2020-02-02 LAB — CK: Total CK: 329 U/L (ref 49–397)

## 2020-02-02 LAB — GLUCOSE, CAPILLARY
Glucose-Capillary: 99 mg/dL (ref 70–99)
Glucose-Capillary: 106 mg/dL — ABNORMAL HIGH (ref 70–99)

## 2020-02-02 LAB — SARS CORONAVIRUS 2 BY RT PCR (HOSPITAL ORDER, PERFORMED IN ~~LOC~~ HOSPITAL LAB): SARS Coronavirus 2: NEGATIVE

## 2020-02-02 MED ORDER — FERROUS SULFATE 325 (65 FE) MG PO TABS
325.0000 mg | ORAL_TABLET | Freq: Every day | ORAL | Status: DC
  Administered 2020-02-02 – 2020-02-13 (×12): 325 mg via ORAL
  Filled 2020-02-02 (×12): qty 1

## 2020-02-02 MED ORDER — POTASSIUM CHLORIDE 20 MEQ PO PACK
20.0000 meq | PACK | Freq: Once | ORAL | Status: AC
  Administered 2020-02-02: 20 meq via ORAL
  Filled 2020-02-02: qty 1

## 2020-02-02 NOTE — Progress Notes (Signed)
Subjective: [name] is a 69 year old man with past medical history of alcohol use disorder, CAD, HTN, alcoholic hepatitis, iron deficiency anemia, and benign essential tremors who presented to the ED on 01/28/20 found to have high-risk alcohol withdrawal.  Overnight, no acute events.  This morning, he reports feeling well with no complaints. He denies symptoms of withdrawal including nausea, vomiting, palpitations, headaches, dizziness, lightheadedness, tremors. He understands and agrees with the recommendation to discharge to a skilled nursing facility. He has no further questions or concerns.  Objective:  Vital signs in last 24 hours: Vitals:   02/02/20 0055 02/02/20 0400  BP: 135/85 (!) 146/99  Pulse: 79 87  Resp: 16 13  Temp: 98.3 F (36.8 C) 98.3 F (36.8 C)  SpO2: 100% 100%   Filed Weights   01/31/20 0337 02/01/20 0527 02/02/20 0400  Weight: 57.2 kg 56.1 kg 57.8 kg    Intake/Output Summary (Last 24 hours) at 02/02/2020 0751 Last data filed at 02/02/2020 0700 Gross per 24 hour  Intake 1920 ml  Output 900 ml  Net 1020 ml    General: NAD, nl appearance Cardiovascular: Tachycardic, regular rhythm.  No murmurs or rubs Pulmonary : Effort normal, breath sounds  Abdominal: soft, nontender,  bowel sounds present Musculoskeletal: no swelling , deformity Skin: Warm, dry  Labs:   BMP Latest Ref Rng & Units 02/02/2020 02/01/2020 01/31/2020  Glucose 70 - 99 mg/dL 94 102(H) 92  BUN 8 - 23 mg/dL 15 10 7(L)  Creatinine 0.61 - 1.24 mg/dL 0.93 1.07 0.90  BUN/Creat Ratio 10 - 24 - - -  Sodium 135 - 145 mmol/L 136 138 137  Potassium 3.5 - 5.1 mmol/L 3.8 3.4(L) 2.8(L)  Chloride 98 - 111 mmol/L 105 105 100  CO2 22 - 32 mmol/L 22 21(L) 22  Calcium 8.9 - 10.3 mg/dL 8.9 9.3 9.1   CBC CBC Latest Ref Rng & Units 02/02/2020 02/01/2020 01/31/2020  WBC 4.0 - 10.5 K/uL 6.1 5.5 5.5  Hemoglobin 13.0 - 17.0 g/dL 11.3(L) 11.9(L) 12.4(L)  Hematocrit 39 - 52 % 35.1(L) 36.6(L) 37.3(L)   Platelets 150 - 400 K/uL PLATELET CLUMPS NOTED ON SMEAR, UNABLE TO ESTIMATE PLATELET CLUMPS NOTED ON SMEAR, UNABLE TO ESTIMATE 77(L)   CK: 329, down from 555, 566, 999.  Imaging: No new imaging  Assessment/Plan:  Principal Problem:   Alcohol withdrawal ([place]) Active Problems:   Essential hypertension   Lactic acidosis   Alcohol use disorder, severe, in early remission [facility])   Rhabdomyolysis  [name] with PMH of alcohol use disorder, CAD, HTN, alcoholic hepatitis, iron deficiency anemia, and benign essential tremors who presented to the ED symptoms consistent with ETOH withdraw and admitted for lactic acidosis and alcohol withdrawal.  #Generalized weakness #Functional incontinence Following resolution of patient's alcohol withdrawal and rhabdomyolysis, patient continues to have significant weakness and gait impairment complicated by functional incontinence. He has been evaluated by PT. He requires moderate to maximum assistance for mobility tasks. Physical therapy has recommended SNF vs. supervision/assistance 24 hours. As patient lives alone and has most of his support from his niece, he would benefit from SNF placement. -Awaiting SNF placement -Continue physical therapy while admitted -OT evaluate and treat -Encourage out of bed to chair, appreciate mobility specialist support  #High risk alcohol withdrawal, resolved Patient has not required ativan in the last 36 hours, therefore we have discontinued CIWA with ativan and tapered patient off of librium. He denies any symptoms of withdrawal. AST and  ALT continue to trend down.  -Thiamine, folic acid, multivitamin supplementation -Telemetry -Plan to discharge with naltrexone   #Rhabdomyolysis, resolved We will continue LR 16mL/hr for rhabdomyolysis.  CK most recently 329 down from 555. -Continue LR until noon then discontinue  #Hypokalemia, improving Potassium 3.8 on morning BMP. -64meq potassium  chloride  #HTN, chronic Slightly hypertensive this morning on home regimen -Continue home medication regimen (metoprolol 25mg  twice daily, lisinopril 10mg  daily, amlodipine 10mg  daily)  #Microcytic anemia, chronic Hgb stable on iron supplementation at home. -Started iron supplementation   #BPH, chronic  -Continue Flomax 0.4 mg daily  #CAD, chronic Patient with a history of angina on nitroglycerin as needed for chest pain at home. No chest pain today. --Nitroglycerin 0.4 mg sublingual q62min prn for chest pain  Cato Mulligan, MD 02/02/2020, 7:51 AM Pager: [phone] After 5pm on weekdays and 1pm on weekends: On Call pager [phone]

## 2020-02-02 NOTE — Evaluation (Signed)
Occupational Therapy Evaluation Patient Details Name: Dan Henry MRN: [id number] DOB: [birth year] Today's Date: 02/02/2020    History of Present Illness Pt is a 69 y/o male admitted secondary to alcohol withdrawal. Pt also with alcoholic hepatitis. PMH includes alcohol abuse, HTN, CAD.   Clinical Impression   PTA, pt was living at home alone, pt reports he was independent with ADL/IADL and functional mobility. Pt with flat affect and limited communication this session. Pt demonstrates decreased awareness of safety and deficits. Pt currently requires minA for bed mobility, modA for stand-pivot transfer to recliner demonstrating a significant posterior lean. Due to decline in current level of function, pt would benefit from acute OT to address cognition, safe mobility, strength and stability for safe completion of ADL/IADL to facilitate safe D/C to venue listed below. At this time, recommend SNF follow-up. Will continue to follow acutely.     Follow Up Recommendations  SNF    Equipment Recommendations  Other (comment) (defer to next venue)    Recommendations for Other Services       Precautions / Restrictions Precautions Precautions: Fall Restrictions Weight Bearing Restrictions: No      Mobility Bed Mobility Overal bed mobility: Needs Assistance Bed Mobility: Supine to Sit     Supine to sit: Min assist     General bed mobility comments: minA for stability and safety, HOB elevated  Transfers Overall transfer level: Needs assistance Equipment used: 1 person hand held assist Transfers: Sit to/from [facility] Sit to Stand: Mod assist Stand pivot transfers: Mod assist       General transfer comment: modA face to face transfer from EOB to recliner;pt with significant posterior lean     Balance Overall balance assessment: Needs assistance Sitting-balance support: Feet supported;Single extremity supported Sitting balance-Leahy Scale: Poor Sitting  balance - Comments: minA for safety and stability sitting EOB Postural control: Posterior lean Standing balance support: Bilateral upper extremity supported;During functional activity Standing balance-Leahy Scale: Zero Standing balance comment: modA for stability in standing;heavy posterior lean                           ADL either performed or assessed with clinical judgement   ADL Overall ADL's : Needs assistance/impaired Eating/Feeding: Set up;Sitting   Grooming: Set up;Sitting   Upper Body Bathing: Minimal assistance;Sitting   Lower Body Bathing: Moderate assistance;Sit to/from stand   Upper Body Dressing : Minimal assistance;Sitting   Lower Body Dressing: Moderate assistance;Sit to/from stand   Toilet Transfer: Moderate assistance;Stand-pivot Toilet Transfer Details (indicate cue type and reason): simulated transfer to recliner  Toileting- Clothing Manipulation and Hygiene: Maximal assistance       Functional mobility during ADLs: Moderate assistance General ADL Comments: minA for safety and stability sitting EOB during dynamic ADL;pt required modA for face to face stand pivot transfer, decreased awareness of deficits and decreased awareness of safety      Vision         Perception     Praxis      Pertinent Vitals/Pain Pain Assessment: No/denies pain     Hand Dominance Right   Extremity/Trunk Assessment Upper Extremity Assessment Upper Extremity Assessment: Generalized weakness;RUE deficits/detail RUE Deficits / Details: noted increased edema in Rhand;educated pt on importance of elevating RUE and mobilizing hands  RUE Sensation: WNL RUE Coordination: WNL   Lower Extremity Assessment Lower Extremity Assessment: Generalized weakness   Cervical / Trunk Assessment Cervical / Trunk Assessment: Kyphotic   Communication Communication Communication:  Expressive difficulties   Cognition Arousal/Alertness: Awake/alert Behavior During Therapy: Flat  affect Overall Cognitive Status: No family/caregiver present to determine baseline cognitive functioning                                 General Comments: Pt with slow processing and difficulty sequencing. Pt occasionally responding to cues, but other times did not. flat affect and limited conversation;decreased awareness of deficits/safety pt stated "I plan to go home and figure it out"   General Comments  HR 77-105bpm;O2 95%-100% on RA     Exercises     Shoulder Instructions      Home Living Family/patient expects to be discharged to:: Private residence Living Arrangements: Alone Available Help at Discharge: Family;Available PRN/intermittently Type of Home: House Home Access: Level entry     Home Layout: One level     Bathroom Shower/Tub: Teacher, early years/pre: Standard     Home Equipment: Cane - single point   Additional Comments: Home info from previous note as pt unable to report      Prior Functioning/Environment Level of Independence: Independent with assistive device(s)        Comments: Pt reports using cane for ambulation, but otherwise independent. Unsure of accuracy given cognitive deficits.         OT Problem List: Decreased strength;Decreased activity tolerance;Impaired balance (sitting and/or standing);Decreased safety awareness;Decreased cognition;Decreased knowledge of use of DME or AE;Decreased knowledge of precautions;Pain;Increased edema      OT Treatment/Interventions: Self-care/ADL training;Therapeutic exercise;Energy conservation;DME and/or AE instruction;Therapeutic activities;Cognitive remediation/compensation;Patient/family education;Balance training    OT Goals(Current goals can be found in the care plan section) Acute Rehab OT Goals Patient Stated Goal: to go home OT Goal Formulation: With patient Time For Goal Achievement: 02/16/20 Potential to Achieve Goals: Fair ADL Goals Pt Will Perform Grooming:  sitting;standing;with supervision Pt Will Perform Lower Body Dressing: with supervision;sit to/from stand Pt Will Transfer to Toilet: ambulating;with min guard assist Additional ADL Goal #1: Pt will complete multistep cognitive task with <3 errors for safe progression of ADL/IADL.  OT Frequency: Min 2X/week   Barriers to D/C:            Co-evaluation              AM-PAC OT "6 Clicks" Daily Activity     Outcome Measure Help from another person eating meals?: A Little Help from another person taking care of personal grooming?: A Little Help from another person toileting, which includes using toliet, bedpan, or urinal?: A Lot Help from another person bathing (including washing, rinsing, drying)?: A Lot Help from another person to put on and taking off regular upper body clothing?: A Little Help from another person to put on and taking off regular lower body clothing?: A Lot 6 Click Score: 15   End of Session Equipment Utilized During Treatment: Gait belt Nurse Communication: Mobility status  Activity Tolerance: Patient tolerated treatment well Patient left: with call bell/phone within reach;in chair;with chair alarm set  OT Visit Diagnosis: Other abnormalities of gait and mobility (R26.89);Muscle weakness (generalized) (M62.81);History of falling (Z91.81);Other symptoms and signs involving cognitive function                Time: 6283-1517 OT Time Calculation (min): 27 min Charges:  OT General Charges $OT Visit: 1 Visit OT Evaluation $OT Eval Moderate Complexity: 1 Mod OT Treatments $Self Care/Home Management : 8-22 mins  Jamilia Jacques OTR/L Acute  Rehabilitation Services Office: [place] 02/02/2020, 10:17 AM

## 2020-02-02 NOTE — TOC Initial Note (Addendum)
Transition of Care [facility]) - Initial/Assessment Note    Patient Details  Name: Dan Henry MRN: [id number] Date of Birth: Nov 27, 1950  [facility]) CM/SW Contact:    Bary Castilla, LCSW Phone Number: [phone] 02/02/2020, 3:57 PM  Clinical Narrative:                 CSW met with patient to discuss PT recommendation of a SNF. Patient was aware of recommendation and in agreement with going to a ST SNF. CSW discussed the SNF process.CSW provided patient with medicare.gov rating list.  Patient gave CSW permission to fax referrals out to local facilities in [place].CSW answered questions about the SNF process and the next steps in the process. Patient is not vaccinated.  Due to patient's fluctuating orientation CSW received permission to speak with patient's niece [name]. [name] was in agreement with the recommendation and stated that she hopes patient does go due to being resistant initially. CSW explained that he was in agreement with the recommendation. [name] has requested to be notified when bed offers come back.  TOC team will continue to assist with discharge planning needs.       Expected Discharge Plan: Skilled Nursing Facility Barriers to Discharge: Insurance Authorization, SNF Pending bed offer, Continued Medical Work up   Patient Goals and CMS Choice Patient states their goals for this hospitalization and ongoing recovery are:: To be able to go home CMS Medicare.gov Compare Post Acute Care list provided to:: Patient    Expected Discharge Plan and Services Expected Discharge Plan: [place] arrangements for the past 2 months: Single Family Home Expected Discharge Date: 01/31/20                                    Prior Living Arrangements/Services Living arrangements for the past 2 months: Single Family Home Lives with:: Self Patient language and need for interpreter reviewed:: Yes Do you feel safe going  back to the place where you live?: Yes        Care giver support system in place?: Yes (comment)      Activities of Daily Living      Permission Sought/Granted Permission sought to share information with : Family Supports Permission granted to share information with : Yes, Verbal Permission Granted  Share Information with NAME: [name]  Permission granted to share info w AGENCY: SNFs  Permission granted to share info w Relationship: Niece  Permission granted to share info w Contact Information: [phone]  Emotional Assessment Appearance:: Appears stated age Attitude/Demeanor/Rapport: Engaged Affect (typically observed): Accepting, Adaptable Orientation: : Oriented to Self, Oriented to Place, Oriented to  Time, Fluctuating Orientation (Suspected and/or reported Sundowners) Alcohol / Substance Use: Alcohol Use    Admission diagnosis:  Hypertensive urgency [I16.0] Alcohol withdrawal syndrome, with delirium ([place]) [F10.231] Patient Active Problem List   Diagnosis Date Noted  . Iron deficiency anemia 11/22/2019  . Syncope 11/18/2019  . Alcohol intoxication ([place]) 11/18/2019  . Stable angina ([place]) 05/08/2019  . Alcoholic hepatitis   . Nosebleed 04/23/2019  . Alcohol withdrawal ([place]) 04/23/2019  . Anemia 02/26/2019  . Rhabdomyolysis 10/26/2018  . Urinary retention 10/25/2018  . Alcohol use disorder, severe, in early remission ([place]) 10/23/2018  . Hepatic hemangioma 10/19/2018  . Visual hallucinations 06/26/2018  . Abnormal TSH 06/26/2018  . Need for hepatitis B vaccination 02/21/2018  . Lactic  acidosis 02/20/2018  . Unintentional weight loss 02/16/2018  . Benign essential tremor 07/19/2017  . Elevated LFTs 05/16/2017  . ASCVD (arteriosclerotic cardiovascular disease) 05/16/2017  . Right carotid bruit 05/08/2017  . Essential hypertension 05/21/2015  . Healthcare maintenance 05/21/2015   PCP:  [name], MD Pharmacy:   [id number] [place], [address]Phone: [phone]: [phone] [id number]: [phone]: [phone]    Social Determinants of Health (SDOH) Interventions    Readmission Risk Interventions No flowsheet data found.

## 2020-02-03 DIAGNOSIS — R2689 Other abnormalities of gait and mobility: Secondary | ICD-10-CM | POA: Diagnosis not present

## 2020-02-03 DIAGNOSIS — R531 Weakness: Secondary | ICD-10-CM

## 2020-02-03 DIAGNOSIS — F10239 Alcohol dependence with withdrawal, unspecified: Secondary | ICD-10-CM | POA: Diagnosis not present

## 2020-02-03 DIAGNOSIS — K7010 Alcoholic hepatitis without ascites: Secondary | ICD-10-CM | POA: Diagnosis not present

## 2020-02-03 LAB — CBC
HCT: 36.8 % — ABNORMAL LOW (ref 39.0–52.0)
Hemoglobin: 11.9 g/dL — ABNORMAL LOW (ref 13.0–17.0)
MCH: 27 pg (ref 26.0–34.0)
MCHC: 32.3 g/dL (ref 30.0–36.0)
MCV: 83.4 fL (ref 80.0–100.0)
Platelets: UNDETERMINED 10*3/uL (ref 150–400)
RBC: 4.41 MIL/uL (ref 4.22–5.81)
RDW: 22.5 % — ABNORMAL HIGH (ref 11.5–15.5)
WBC: 4.3 10*3/uL (ref 4.0–10.5)
nRBC: 0 % (ref 0.0–0.2)

## 2020-02-03 LAB — COMPREHENSIVE METABOLIC PANEL
ALT: 56 U/L — ABNORMAL HIGH (ref 0–44)
AST: 63 U/L — ABNORMAL HIGH (ref 15–41)
Albumin: 3.5 g/dL (ref 3.5–5.0)
Alkaline Phosphatase: 38 U/L (ref 38–126)
Anion gap: 10 (ref 5–15)
BUN: 10 mg/dL (ref 8–23)
CO2: 25 mmol/L (ref 22–32)
Calcium: 9.3 mg/dL (ref 8.9–10.3)
Chloride: 103 mmol/L (ref 98–111)
Creatinine, Ser: 0.96 mg/dL (ref 0.61–1.24)
GFR calc Af Amer: >60 mL/min (ref >60)
GFR calc non Af Amer: >60 mL/min (ref >60)
Glucose, Bld: 113 mg/dL — ABNORMAL HIGH (ref 70–99)
Potassium: 3.6 mmol/L (ref 3.5–5.1)
Sodium: 138 mmol/L (ref 135–145)
Total Bilirubin: 0.9 mg/dL (ref 0.3–1.2)
Total Protein: 7 g/dL (ref 6.5–8.1)

## 2020-02-03 LAB — GLUCOSE, CAPILLARY: Glucose-Capillary: 191 mg/dL — ABNORMAL HIGH (ref 70–99)

## 2020-02-03 MED ORDER — PNEUMOCOCCAL VAC POLYVALENT 25 MCG/0.5ML IJ INJ
0.5000 mL | INJECTION | INTRAMUSCULAR | Status: DC
  Filled 2020-02-03: qty 0.5

## 2020-02-03 MED ORDER — POTASSIUM CHLORIDE 20 MEQ PO PACK
20.0000 meq | PACK | Freq: Two times a day (BID) | ORAL | Status: DC
  Administered 2020-02-03 – 2020-02-06 (×8): 20 meq via ORAL
  Filled 2020-02-03 (×8): qty 1

## 2020-02-03 NOTE — Progress Notes (Addendum)
Subjective: [name] is a 69 year old man with past medical history of alcohol use disorder, CAD, HTN, alcoholic hepatitis, iron deficiency anemia, and benign essential tremors who presented to the ED on 01/28/20 found to have high-risk alcohol withdrawal.  Overnight, no acute events.  This morning, he reports that he feels well. He denies pain or withdrawal symptoms. He looks forward to being discharged to a SNF and working with social work. He has no other concerns or questions.  Objective:  Vital signs in last 24 hours: Vitals:   02/03/20 0028 02/03/20 0400  BP: (!) 167/107 138/75  Pulse: (!) 101 83  Resp:  18  Temp: 97.8 F (36.6 C) 97.9 F (36.6 C)  SpO2: 100% 100%   Filed Weights   02/01/20 0527 02/02/20 0400 02/03/20 0358  Weight: 56.1 kg 57.8 kg 57.3 kg    Intake/Output Summary (Last 24 hours) at 02/03/2020 0655 Last data filed at 02/03/2020 0538 Gross per 24 hour  Intake 1680 ml  Output 2580 ml  Net -900 ml   General: NAD, nl appearance Cardiovascular: Tachycardic, regular rhythm. No murmurs or rubs Pulmonary : Effort normal, breath sounds  Abdominal: soft, nontender, bowel sounds present Musculoskeletal: no swelling or deformity Skin: Warm, dry  Labs:   BMP Latest Ref Rng & Units 02/03/2020 02/02/2020 02/01/2020  Glucose 70 - 99 mg/dL 113(H) 94 102(H)  BUN 8 - 23 mg/dL 10 15 10   Creatinine 0.61 - 1.24 mg/dL 0.96 0.93 1.07  BUN/Creat Ratio 10 - 24 - - -  Sodium 135 - 145 mmol/L 138 136 138  Potassium 3.5 - 5.1 mmol/L 3.6 3.8 3.4(L)  Chloride 98 - 111 mmol/L 103 105 105  CO2 22 - 32 mmol/L 25 22 21(L)  Calcium 8.9 - 10.3 mg/dL 9.3 8.9 9.3   CBC CBC Latest Ref Rng & Units 02/03/2020 02/02/2020 02/01/2020  WBC 4.0 - 10.5 K/uL 4.3 6.1 5.5  Hemoglobin 13.0 - 17.0 g/dL 11.9(L) 11.3(L) 11.9(L)  Hematocrit 39 - 52 % 36.8(L) 35.1(L) 36.6(L)  Platelets 150 - 400 K/uL PLATELET CLUMPS NOTED ON SMEAR, UNABLE TO ESTIMATE PLATELET CLUMPS NOTED ON SMEAR, UNABLE  TO ESTIMATE PLATELET CLUMPS NOTED ON SMEAR, UNABLE TO ESTIMATE   Imaging: No new imaging  Assessment/Plan:  Principal Problem:   Alcohol withdrawal ([facility]) Active Problems:   Essential hypertension   Lactic acidosis   Alcohol use disorder, severe, in early remission [facility])   Rhabdomyolysis  [name] with PMH of alcohol use disorder, CAD, HTN, alcoholic hepatitis, iron deficiency anemia, and benign essential tremors who presented to the ED symptoms consistent with ETOH withdraw and admitted for lactic acidosis and alcohol withdrawal.  #Generalized weakness #Functional incontinence Following resolution of patient's alcohol withdrawal and rhabdomyolysis, patient continues to have significant weakness and gait impairment complicated by functional incontinence. He has been evaluated by PT and OT. He requires moderate to maximum assistance for mobility tasks. PT and OT have recommended SNF vs. supervision/assistance 24 hours. As patient lives alone and has most of his support from his niece, he would benefit from SNF placement. -Awaiting SNF placement -Continue PT and OT while admitted -Nursing order place to assist patient into chair today  #High risk alcohol withdrawal, resolved Patient has not required ativan in the last 36 hours, therefore we have discontinued CIWA with ativan and tapered patient off of librium. He denies any symptoms of withdrawal. AST and ALT continue to trend down.  -Thiamine, folic acid, multivitamin supplementation -Telemetry -  Plan to discharge with naltrexone   #Hypokalemia, improving Potassium 3.6 on morning BMP. Continues to trend down despite daily repletion. -Schedule 53mEq potassium chloride twice daily  #Rhabdomyolysis, resolved CK most recently 329 down from 555. Discontinued IVF.  #HTN, chronic Slightly hypertensive this morning on home regimen -Continue home medication regimen (metoprolol 25mg  twice daily, lisinopril 10mg   daily, amlodipine 10mg  daily)  #Microcytic anemia, chronic Hgb stable on iron supplementation at home. -Started iron supplementation   #BPH, chronic  -Continue Flomax 0.4 mg daily  #CAD, chronic Patient with a history of angina on nitroglycerin as needed for chest pain at home. No chest pain today. --Nitroglycerin 0.4 mg sublingual q52min prn for chest pain  Cato Mulligan, MD 02/03/2020, 6:55 AM Pager: [phone] After 5pm on weekdays and 1pm on weekends: On Call pager [phone]

## 2020-02-03 NOTE — Progress Notes (Signed)
Paged MD to make aware of niece request to get an update on patient.  I explained that he was still awaiting bed offers. Mitts have been off for me all day, niece states they were removed last night.  Patient continues to be weak and unable to stand.  Patient has been eating well on his own. Pt resting with call bell within reach.  Will continue to monitor.

## 2020-02-03 NOTE — Care Management Important Message (Signed)
Important Message  Patient Details  Name: Dan Henry MRN: [id number] Date of Birth: [birth year]   Medicare Important Message Given:  Yes     [name] 02/03/2020, 5:07 PM

## 2020-02-03 NOTE — Discharge Summary (Signed)
Name: Dan Henry MRN: [id number] DOB: 04/30/1951 69 y.o. PCP: Dan Rosenthal, MD  Date of Admission: 01/28/2020  1:04 PM Date of Discharge: 02/13/2020 Attending Physician: Aldine Contes, MD  Discharge Diagnosis: 1. Principal Problem:   Alcohol withdrawal ([place]) Active Problems:   Essential hypertension   Lactic acidosis   Alcohol use disorder, severe, in early remission (HCC)   Malnutrition of moderate degree   Rhabdomyolysis  Discharge Medications: Allergies as of 02/13/2020   No Known Allergies     Medication List    STOP taking these medications   chlordiazePOXIDE 25 MG capsule Commonly known as: LIBRIUM     TAKE these medications   amLODipine 10 MG tablet Commonly known as: NORVASC Take 1 tablet (10 mg total) by mouth daily.   atorvastatin 40 MG tablet Commonly known as: LIPITOR Take 1 tablet (40 mg total) by mouth daily.   feeding supplement Liqd Take 237 mLs by mouth 3 (three) times daily between meals. What changed: when to take this   ferrous sulfate 325 (65 FE) MG tablet TAKE 1 TABLET BY MOUTH EVERY DAY   folic acid 1 MG tablet Commonly known as: FOLVITE TAKE 1 TABLET BY MOUTH EVERY DAY   lisinopril 10 MG tablet Commonly known as: ZESTRIL Take 1 tablet (10 mg total) by mouth daily.   metoprolol tartrate 25 MG tablet Commonly known as: LOPRESSOR Take 1 tablet (25 mg total) by mouth 2 (two) times daily.   multivitamin with minerals Tabs tablet Take 1 tablet by mouth every morning.   naltrexone 50 MG tablet Commonly known as: DEPADE Take 1 tablet (50 mg total) by mouth daily.   nitroGLYCERIN 0.4 MG SL tablet Commonly known as: NITROSTAT Place 1 tablet (0.4 mg total) under the tongue every 5 (five) minutes as needed for chest pain.   tamsulosin 0.4 MG Caps capsule Commonly known as: FLOMAX Take 1 capsule by mouth once daily   thiamine 100 MG tablet Take 1 tablet (100 mg total) by mouth daily.            Durable Medical  Equipment  (From admission, onward)         Start     Ordered   02/13/20 0943  DME 3-in-1  Once        02/13/20 0945   02/13/20 0943  DME Walker  Once       Question Answer Comment  Walker: With [place] Wheels   Patient needs a walker to treat with the following condition Weakness      02/13/20 0945   02/13/20 0943  DME Tub Bench  Once        02/13/20 0945         Disposition and follow-up:   Dan Henry was discharged from [facility] in Stable condition.  At the hospital follow up visit please address:  1.  Assess status of patient's alcohol use disorder, adherence to prescribed naltrexone, left foot pain, and progression with home health PT and OT. If foot pain unimproved, can consider referral to podiatry  2.  Labs / imaging needed at time of follow-up: CBC, special attention to hemoglobin and thrombocytosis developed on day of discharge. CMP, special attention to liver and kidney function.  3.  Pending labs/ test needing follow-up: none  Follow-up Appointments:  Follow-up Information    Dan Rosenthal, MD. Schedule an appointment as soon as possible for a visit in 1 week(s).   Specialty: Internal Medicine Contact information:  [address] [address] [phone]        Jerline Pain, MD .   Specialty: Cardiology Contact information: [phone] [address] [place] [address] [phone]        Care, [facility] Follow up.   Specialty: Home Health Services Why: Physical Therapy/Occupational Therapy- office to call with visit time Contact information: [address] [place] [address] [phone]        [facility], [facility] Follow up.   Why: [place] and Bedside [employer]-plan to deliver to the room prior to transition home.  Contact information: [place] [address] [place] by problem list:  #Alcohol  withdrawal [name] has a history of alcohol use disorder with multiple hospital admissions for alcohol withdrawal who presented to [facility] on 01/28/2020 with high risk alcohol withdrawal. He was placed on CIWA with ativan as well as a librium taper. He required significant ativan to control his condition over the first 24 hours with gradual improvement of his withdrawal symptoms over the next two days. He was taken off of CIWA with ativan and librium with complete resolution of his withdrawal symptoms, agitation and confusion. He reported being interested in taking naltrexone upon discharge to reduce the likelihood of him returning to alcohol use and possible repeat admission for alcohol withdrawals in the future.  #Generalized weakness Upon resolution of patient's alcohol withdrawal, lactic acidosis and rhabdomyolysis, patient continued to have generalized weakness complicated by functional incontinence. PT and OT evaluated patient and recommended placement in SNF. While awaiting SNF placement for greater than one week, patient had significant inpatient therapy and was determined to be appropriate for discharge home with home health PT and OT.  #Left foot pain Throughout hospitalization, patient reported intermitted mild to moderate pain to the sole of his left foot. Examination was repeatedly unremarkable and imaging revealed no acute fracture or dislocation. Conservative management throughout hospitalization.  #Lactic acidosis Patient initially presented with a lactic acidosis of 5.1. He received IVF with improvement of his lactic acidosis to 1.7. IVF were discontinued as patient was transitioned to a diet as his withdrawal symptoms improved.  #Rhabdomyolysis Patient presented with CK of 999 in the setting of alcohol withdrawal. There was concern for trauma vs. seizure as the underlying etiology for his elevated CK. He received IVF with resolution of his CK to within normal limits (329.) IVF were  discontinued and patient was transitioned to a diet to continue hydration orally.  #HTN Patient initially severely hypertensive on presentation with resolution of his hypertension with initiation of his home antihypertensive regimen.  #Iron Deficiency Anemia Patient with chronic anemia with hemoglobin stable throughout hospitalization. Resumed iron supplementation during hospitalization.  #BPH Patient with BPH managed with flomax. No acute problems regarding this condition during hospitalization. Continued on Flomax.  Discharge Vitals:   BP 129/76 (BP Location: Right Arm)   Pulse 81   Temp 98.4 F (36.9 C) (Oral)   Resp 16   Ht 5\' 5"  (1.651 m)   Wt 55.4 kg   SpO2 100%   BMI 20.32 kg/m   Pertinent Labs, Studies, and Procedures:  CBC Latest Ref Rng & Units 02/13/2020 02/12/2020 02/11/2020  WBC 4.0 - 10.5 K/uL 8.3 8.5 10.0  Hemoglobin 13.0 - 17.0 g/dL 11.4(L) 11.3(L) 11.2(L)  Hematocrit 39 - 52 % 34.9(L) 35.5(L) 35.0(L)  Platelets 150 -  400 K/uL 477(H) 403(H) 373   CMP Latest Ref Rng & Units 02/13/2020 02/12/2020 02/11/2020  Glucose 70 - 99 mg/dL 105(H) 102(H) 99  BUN 8 - 23 mg/dL 16 15 18   Creatinine 0.61 - 1.24 mg/dL 1.00 1.02 1.26(H)  Sodium 135 - 145 mmol/L 137 140 138  Potassium 3.5 - 5.1 mmol/L 4.2 4.3 4.7  Chloride 98 - 111 mmol/L 103 105 104  CO2 22 - 32 mmol/L 24 24 26   Calcium 8.9 - 10.3 mg/dL 9.7 9.4 9.3  Total Protein 6.5 - 8.1 g/dL 7.6 6.8 7.2  Total Bilirubin 0.3 - 1.2 mg/dL 0.5 0.7 0.5  Alkaline Phos 38 - 126 U/L 55 44 46  AST 15 - 41 U/L 25 25 25   ALT 0 - 44 U/L 27 27 26    Discharge Instructions: Discharge Instructions    Diet - low sodium heart healthy   Complete by: As directed    Face-to-face encounter (required for Medicare/Medicaid patients)   Complete by: As directed    I [place] certify that this patient is under my care and that I, or a nurse practitioner or physician's assistant working with me, had a face-to-face encounter that meets the  physician face-to-face encounter requirements with this patient on 02/13/2020. The encounter with the patient was in whole, or in part for the following medical condition(s) which is the primary reason for home health care (List medical condition): generalized weakness, initially planning to discharge to SNF, however patient had improvement in his overall medical condition while awaiting SNF placement.   The encounter with the patient was in whole, or in part, for the following medical condition, which is the primary reason for home health care: Weakness   I certify that, based on my findings, the following services are medically necessary home health services: Physical therapy   Reason for Medically Necessary Home Health Services: Therapy- Therapeutic Exercises to Increase Strength and Endurance   My clinical findings support the need for the above services: Unsafe ambulation due to balance issues   Further, I certify that my clinical findings support that this patient is homebound due to: Ambulates short distances less than 300 feet   Home Health   Complete by: As directed    To provide the following care/treatments:  PT OT     Increase activity slowly   Complete by: As directed    Increase activity slowly   Complete by: As directed      Signed: Cato Mulligan, MD 02/13/2020, 1:34 PM   Pager: [phone]

## 2020-02-03 NOTE — TOC Progression Note (Signed)
Transition of Care [facility]) - Progression Note    Patient Details  Name: Amaru Burroughs MRN: [id number] Date of Birth: 1950-07-07  Transition of Care [facility]) CM/SW [place], [place] Phone Number: 02/03/2020, 4:18 PM  Clinical Narrative:     Patient has no bed offers. [facility] declined.  Thurmond Butts, MSW, [place] Clinical Social Worker   Expected Discharge Plan: Skilled Nursing Facility Barriers to Discharge: Ship broker, SNF Pending bed offer, Continued Medical Work up  Expected Discharge Plan and Services Expected Discharge Plan: [place] arrangements for the past 2 months: Single Family Home Expected Discharge Date: 01/31/20                                     Social Determinants of Health (SDOH) Interventions    Readmission Risk Interventions No flowsheet data found.

## 2020-02-03 NOTE — TOC Progression Note (Signed)
Transition of Care [facility]) - Progression Note    Patient Details  Name: Dan Henry MRN: [id number] Date of Birth: 10-19-1950  Transition of Care [facility] CM/SW [place], [place] Phone Number: 02/03/2020, 12:05 PM  Clinical Narrative:     Patient has no bed offers. CSW requested [facility] to review- waiting on response.  CSW will continue to follow and assist with discharge planning.  Thurmond Butts, MSW, [place] Clinical Social Worker   Expected Discharge Plan: Skilled Nursing Facility Barriers to Discharge: Ship broker, SNF Pending bed offer, Continued Medical Work up  Expected Discharge Plan and Services Expected Discharge Plan: [place] arrangements for the past 2 months: Single Family Home Expected Discharge Date: 01/31/20                                     Social Determinants of Health (SDOH) Interventions    Readmission Risk Interventions No flowsheet data found.

## 2020-02-04 DIAGNOSIS — N179 Acute kidney failure, unspecified: Secondary | ICD-10-CM | POA: Diagnosis not present

## 2020-02-04 DIAGNOSIS — F10239 Alcohol dependence with withdrawal, unspecified: Secondary | ICD-10-CM | POA: Diagnosis not present

## 2020-02-04 DIAGNOSIS — R531 Weakness: Secondary | ICD-10-CM | POA: Diagnosis not present

## 2020-02-04 DIAGNOSIS — K7010 Alcoholic hepatitis without ascites: Secondary | ICD-10-CM | POA: Diagnosis not present

## 2020-02-04 LAB — COMPREHENSIVE METABOLIC PANEL
ALT: 45 U/L — ABNORMAL HIGH (ref 0–44)
AST: 44 U/L — ABNORMAL HIGH (ref 15–41)
Albumin: 3.2 g/dL — ABNORMAL LOW (ref 3.5–5.0)
Alkaline Phosphatase: 43 U/L (ref 38–126)
Anion gap: 9 (ref 5–15)
BUN: 14 mg/dL (ref 8–23)
CO2: 24 mmol/L (ref 22–32)
Calcium: 9.6 mg/dL (ref 8.9–10.3)
Chloride: 104 mmol/L (ref 98–111)
Creatinine, Ser: 1.25 mg/dL — ABNORMAL HIGH (ref 0.61–1.24)
GFR calc Af Amer: >60 mL/min (ref >60)
GFR calc non Af Amer: 58 mL/min — ABNORMAL LOW (ref >60)
Glucose, Bld: 108 mg/dL — ABNORMAL HIGH (ref 70–99)
Potassium: 3.9 mmol/L (ref 3.5–5.1)
Sodium: 137 mmol/L (ref 135–145)
Total Bilirubin: 0.7 mg/dL (ref 0.3–1.2)
Total Protein: 6.1 g/dL — ABNORMAL LOW (ref 6.5–8.1)

## 2020-02-04 LAB — CBC
HCT: 32.9 % — ABNORMAL LOW (ref 39.0–52.0)
Hemoglobin: 10.5 g/dL — ABNORMAL LOW (ref 13.0–17.0)
MCH: 27 pg (ref 26.0–34.0)
MCHC: 31.9 g/dL (ref 30.0–36.0)
MCV: 84.6 fL (ref 80.0–100.0)
Platelets: 161 10*3/uL (ref 150–400)
RBC: 3.89 MIL/uL — ABNORMAL LOW (ref 4.22–5.81)
RDW: 22.8 % — ABNORMAL HIGH (ref 11.5–15.5)
WBC: 4.9 10*3/uL (ref 4.0–10.5)
nRBC: 0 % (ref 0.0–0.2)

## 2020-02-04 LAB — GLUCOSE, CAPILLARY: Glucose-Capillary: 129 mg/dL — ABNORMAL HIGH (ref 70–99)

## 2020-02-04 MED ORDER — LACTATED RINGERS IV SOLN: INTRAVENOUS | Status: AC

## 2020-02-04 NOTE — Progress Notes (Signed)
Subjective: [name] is a 69 year old man with past medical history of alcohol use disorder, CAD, HTN, alcoholic hepatitis, iron deficiency anemia, and benign essential tremors who presented to the ED on 01/28/20 found to have high-risk alcohol withdrawal.  Overnight, no acute events.  This morning, denies any symptoms of withdraw. He would like to go to SNF and we discussed SW is working on placement. We discussed his kidney function and will give him some IV fluids today. Encourage po intake. Denies any acute concerns.   Objective:  Vital signs in last 24 hours: Vitals:   02/03/20 2336 02/04/20 0329  BP: 107/66 126/81  Pulse: 71 86  Resp: 16 12  Temp: 98.9 F (37.2 C) 98.1 F (36.7 C)  SpO2: 100% 100%   SpO2: 100 % O2 Flow Rate (L/min): 0 L/min  Filed Weights   02/02/20 0400 02/03/20 0358 02/04/20 0328  Weight: 57.8 kg 57.3 kg 55.4 kg    Intake/Output Summary (Last 24 hours) at 02/04/2020 0601 Last data filed at 02/03/2020 1710 Gross per 24 hour  Intake 720 ml  Output 400 ml  Net 320 ml   General: NAD, nl appearance Cardiovascular: Tachycardic, regular rhythm. No murmurs or rubs Pulmonary : Effort normal, breath sounds  Abdominal: soft, nontender, bowel sounds present Musculoskeletal: no swelling or deformity Skin: Warm, dry  Labs:   CMP Latest Ref Rng & Units 02/04/2020 02/03/2020 02/02/2020  Glucose 70 - 99 mg/dL 108(H) 113(H) 94  BUN 8 - 23 mg/dL 14 10 15   Creatinine 0.61 - 1.24 mg/dL 1.25(H) 0.96 0.93  Sodium 135 - 145 mmol/L 137 138 136  Potassium 3.5 - 5.1 mmol/L 3.9 3.6 3.8  Chloride 98 - 111 mmol/L 104 103 105  CO2 22 - 32 mmol/L 24 25 22   Calcium 8.9 - 10.3 mg/dL 9.6 9.3 8.9  Total Protein 6.5 - 8.1 g/dL 6.1(L) 7.0 6.6  Total Bilirubin 0.3 - 1.2 mg/dL 0.7 0.9 0.9  Alkaline Phos 38 - 126 U/L 43 38 38  AST 15 - 41 U/L 44(H) 63(H) 66(H)  ALT 0 - 44 U/L 45(H) 56(H) 51(H)   CBC Latest Ref Rng & Units 02/04/2020 02/03/2020 02/02/2020  WBC 4.0 -  10.5 K/uL 4.9 4.3 6.1  Hemoglobin 13.0 - 17.0 g/dL 10.5(L) 11.9(L) 11.3(L)  Hematocrit 39 - 52 % 32.9(L) 36.8(L) 35.1(L)  Platelets 150 - 400 K/uL 161 PLATELET CLUMPS NOTED ON SMEAR, UNABLE TO ESTIMATE PLATELET CLUMPS NOTED ON SMEAR, UNABLE TO ESTIMATE   Imaging: No new imaging  Assessment/Plan:  Principal Problem:   Alcohol withdrawal (HCC) Active Problems:   Essential hypertension   Lactic acidosis   Alcohol use disorder, severe, in early remission [facility])   Rhabdomyolysis  [name] with PMH of alcohol use disorder, CAD, HTN, alcoholic hepatitis, iron deficiency anemia, and benign essential tremors who presented to the ED symptoms consistent with ETOH withdraw and admitted for lactic acidosis and alcohol withdrawal.  #Generalized weakness, active #Functional incontinence, active Following resolution of patient's alcohol withdrawal and rhabdomyolysis, patient continues to have significant weakness and gait impairment complicated by functional incontinence. He has been evaluated by PT and OT. He requires moderate to maximum assistance for mobility tasks. PT and OT have recommended SNF vs. supervision/assistance 24 hours. As patient lives alone and has most of his support from his niece, he would benefit from SNF placement. -Awaiting SNF placement -Continue PT and OT while admitted -Nursing order place to assist patient into chair  #  AKI, active Creatinine bumped up to 1.25 from 0.96 yesterday. -200cc/hr LR for five hours  #High risk alcohol withdrawal, resolved Off CIWA with ativan and librium for several days. He denies any symptoms of withdrawal. AST and ALT continue to trend down, most recently 44 and 45, respectively. -Thiamine, folic acid, multivitamin supplementation -Telemetry -Plan to discharge with naltrexone  -Daily CMP  #Hypokalemia, stable Potassium 3.9 on morning BMP. Receiving potassium supplementation. -Schedule 88mEq potassium chloride  twice daily -Daily CMP  #Rhabdomyolysis, resolved CK 329 down from 555 several days ago.  #HTN, chronic Normotensive this morning on home regimen -Continue home medication regimen (metoprolol 25mg  twice daily, lisinopril 10mg  daily, amlodipine 10mg  daily)  #Microcytic anemia, chronic Hgb stable on iron supplementation at home. -Continue iron supplementation -Daily CBC   #BPH, chronic  -Continue Flomax 0.4 mg daily  #CAD, chronic Patient with a history of angina on nitroglycerin as needed for chest pain at home. No chest pain today. --Nitroglycerin 0.4 mg sublingual q6min prn for chest pain  Cato Mulligan, MD 02/04/2020, 6:01 AM Pager: [phone] After 5pm on weekdays and 1pm on weekends: On Call pager [phone]

## 2020-02-04 NOTE — Progress Notes (Addendum)
Physical Therapy Treatment Patient Details Name: Dan Henry MRN: [id number] DOB: [birth year] Today's Date: 02/04/2020    History of Present Illness Pt is a 69 y/o male admitted secondary to alcohol withdrawal. Pt also with alcoholic hepatitis. PMH includes alcohol abuse, HTN, CAD.    PT Comments    Pt supine in bed on arrival.  He is eager to mobilize and able to come to edge of bed with decreased assistance.  He continues to push posteriorly in standing.  As gt progressed his balance improved.  He reports pain in B feet with ambulation.  Plan for snf placement remains appropriate this session.     Follow Up Recommendations  SNF;Supervision/Assistance - 24 hour     Equipment Recommendations  Wheelchair cushion (measurements PT);Wheelchair (measurements PT)    Recommendations for Other Services       Precautions / Restrictions Precautions Precautions: Fall Restrictions Weight Bearing Restrictions: No    Mobility  Bed Mobility Overal bed mobility: Needs Assistance Bed Mobility: Supine to Sit;Sit to Supine     Supine to sit: Min guard     General bed mobility comments: Min guard to come to sitting edge of bed.  Transfers Overall transfer level: Needs assistance Equipment used: Rolling walker (2 wheeled) Transfers: Sit to/from Stand Sit to Stand: Mod assist Stand pivot transfers: Mod assist       General transfer comment: Posterior lean, required assistance to correct balance and to pivot from surface to surface.  Ambulation/Gait Ambulation/Gait assistance: Max assist;Min assist (Intially max assistance to maintain balance as gt progressed he required decreased assistance.) Gait Distance (Feet): 100 Feet Assistive device: Rolling walker (2 wheeled) Gait Pattern/deviations: Step-through pattern;Decreased stride length;Trunk flexed;Narrow base of support     General Gait Details: Cues for sequencing and RW safety.  Pt required assistance to weight shift forward  and increase step length.  Balance improved as gt progressed.   Stairs             Wheelchair Mobility    Modified Rankin (Stroke Patients Only)       Balance Overall balance assessment: Needs assistance Sitting-balance support: Feet supported;Single extremity supported Sitting balance-Leahy Scale: Poor Sitting balance - Comments: minA for safety and stability sitting EOB     Standing balance-Leahy Scale: Zero                              Cognition Arousal/Alertness: Awake/alert Behavior During Therapy: Flat affect Overall Cognitive Status: No family/caregiver present to determine baseline cognitive functioning                                 General Comments: Pt continues to be slow to follow commands and presents with decreased awareness of his deficits.      Exercises      General Comments        Pertinent Vitals/Pain Pain Assessment: No/denies pain    Home Living                      Prior Function            PT Goals (current goals can now be found in the care plan section) Acute Rehab PT Goals Patient Stated Goal: to go home PT Goal Formulation: Patient unable to participate in goal setting Potential to Achieve Goals: Fair Progress towards PT goals: Progressing toward goals  Frequency    Min 2X/week      PT Plan Current plan remains appropriate    Co-evaluation              AM-PAC PT "6 Clicks" Mobility   Outcome Measure  Help needed turning from your back to your side while in a flat bed without using bedrails?: A Little Help needed moving from lying on your back to sitting on the side of a flat bed without using bedrails?: A Little Help needed moving to and from a bed to a chair (including a wheelchair)?: A Lot Help needed standing up from a chair using your arms (e.g., wheelchair or bedside chair)?: A Lot Help needed to walk in hospital room?: A Lot Help needed climbing 3-5 steps with a  railing? : Total 6 Click Score: 13    End of Session Equipment Utilized During Treatment: Gait belt Activity Tolerance: Patient tolerated treatment well Patient left: in bed;with call bell/phone within reach;with bed alarm set Nurse Communication: Mobility status PT Visit Diagnosis: Unsteadiness on feet (R26.81);Muscle weakness (generalized) (M62.81);Difficulty in walking, not elsewhere classified (R26.2)     Time: 1694-5038 PT Time Calculation (min) (ACUTE ONLY): 27 min  Charges:  $Gait Training: 8-22 mins $Therapeutic Activity: 8-22 mins                     Dan Henry , PTA Acute Rehabilitation Services Pager [phone] Office [phone]     Analia Zuk Eli Hose 02/04/2020, 3:05 PM

## 2020-02-05 DIAGNOSIS — F10239 Alcohol dependence with withdrawal, unspecified: Secondary | ICD-10-CM | POA: Diagnosis not present

## 2020-02-05 DIAGNOSIS — K7010 Alcoholic hepatitis without ascites: Secondary | ICD-10-CM | POA: Diagnosis not present

## 2020-02-05 DIAGNOSIS — R531 Weakness: Secondary | ICD-10-CM | POA: Diagnosis not present

## 2020-02-05 DIAGNOSIS — R2689 Other abnormalities of gait and mobility: Secondary | ICD-10-CM | POA: Diagnosis not present

## 2020-02-05 LAB — CBC
HCT: 33.8 % — ABNORMAL LOW (ref 39.0–52.0)
Hemoglobin: 11 g/dL — ABNORMAL LOW (ref 13.0–17.0)
MCH: 27.7 pg (ref 26.0–34.0)
MCHC: 32.5 g/dL (ref 30.0–36.0)
MCV: 85.1 fL (ref 80.0–100.0)
Platelets: 210 10*3/uL (ref 150–400)
RBC: 3.97 MIL/uL — ABNORMAL LOW (ref 4.22–5.81)
RDW: 22.5 % — ABNORMAL HIGH (ref 11.5–15.5)
WBC: 6.3 10*3/uL (ref 4.0–10.5)
nRBC: 0 % (ref 0.0–0.2)

## 2020-02-05 LAB — COMPREHENSIVE METABOLIC PANEL
ALT: 40 U/L (ref 0–44)
AST: 33 U/L (ref 15–41)
Albumin: 3.4 g/dL — ABNORMAL LOW (ref 3.5–5.0)
Alkaline Phosphatase: 37 U/L — ABNORMAL LOW (ref 38–126)
Anion gap: 10 (ref 5–15)
BUN: 11 mg/dL (ref 8–23)
CO2: 23 mmol/L (ref 22–32)
Calcium: 9.7 mg/dL (ref 8.9–10.3)
Chloride: 104 mmol/L (ref 98–111)
Creatinine, Ser: 1.04 mg/dL (ref 0.61–1.24)
GFR calc non Af Amer: >60 mL/min (ref >60)
Glucose, Bld: 96 mg/dL (ref 70–99)
Potassium: 4 mmol/L (ref 3.5–5.1)
Sodium: 137 mmol/L (ref 135–145)
Total Bilirubin: 0.9 mg/dL (ref 0.3–1.2)
Total Protein: 6.8 g/dL (ref 6.5–8.1)

## 2020-02-05 NOTE — Progress Notes (Addendum)
Occupational Therapy Treatment Patient Details Name: Dan Henry MRN: [id number] DOB: [birth year] Today's Date: 02/05/2020    History of present illness Pt is a 69 y/o male admitted secondary to alcohol withdrawal. Pt also with alcoholic hepatitis. PMH includes alcohol abuse, HTN, CAD.   OT comments  Patient eager to work with OT this date.  Good participation with self care at the sink.  Patient with improved self care ability seated at the sink, but mobility was difficult for him due to L fore foot pain.  Patient had 1 loss of balance to the R trying to decrease weight through the L foot.  Nursing found and reported pain to patient's L foot.  Described swelling and tender to touch.  Decreased balance, generalized weakness, and pain to his left foot limit ADL and functional mobility independence.  OT to continue to see in the acute setting, frequency dropped to 1x/wk.  SNF post acute is appropriate.    Follow Up Recommendations  SNF    Equipment Recommendations  Tub/shower seat    Recommendations for Other Services      Precautions / Restrictions Precautions Precautions: Fall Restrictions Weight Bearing Restrictions: No Other Position/Activity Restrictions: patient had c/o pain to L forefoot.  Appears swollen and tender to the touch.       Mobility Bed Mobility Overal bed mobility: Needs Assistance       Supine to sit: Min guard        Transfers Overall transfer level: Needs assistance Equipment used: Rolling walker (2 wheeled)   Sit to Stand: Min assist Stand pivot transfers: Mod assist            Balance   Sitting-balance support: Feet supported;Single extremity supported Sitting balance-Leahy Scale: Fair     Standing balance support: Bilateral upper extremity supported Standing balance-Leahy Scale: Poor                             ADL either performed or assessed with clinical judgement   ADL Overall ADL's : Needs  assistance/impaired Eating/Feeding: Set up;Sitting   Grooming: Set up;Sitting   Upper Body Bathing: Supervision/ safety;Sitting   Lower Body Bathing: Minimal assistance;Sit to/from stand   Upper Body Dressing : Minimal assistance;Sitting   Lower Body Dressing: Minimal assistance;Sit to/from stand               Functional mobility during ADLs: Minimal assistance General ADL Comments: RW level and 1 LOB noted to the right.  Patient is trying not to bear weight on L fore foot.                     General Comments BP 121/65    Pertinent Vitals/ Pain       Pain Assessment: 0-10 Pain Score: 6  Pain Location: l foot Pain Descriptors / Indicators: Guarding;Grimacing;Stabbing Pain Intervention(s): Monitored during session;Repositioned                                                          Frequency  Min 1X/week        Progress Toward Goals  OT Goals(current goals can now be found in the care plan section)  Progress towards OT goals: Progressing toward goals  Acute Rehab OT Goals Patient Stated Goal:  keeping getting better. OT Goal Formulation: With patient Time For Goal Achievement: 02/16/20 Potential to Achieve Goals: Good  Plan Discharge plan remains appropriate    Co-evaluation                 AM-PAC OT "6 Clicks" Daily Activity     Outcome Measure   Help from another person eating meals?: None Help from another person taking care of personal grooming?: A Little Help from another person toileting, which includes using toliet, bedpan, or urinal?: A Lot Help from another person bathing (including washing, rinsing, drying)?: A Little Help from another person to put on and taking off regular upper body clothing?: A Little Help from another person to put on and taking off regular lower body clothing?: A Little 6 Click Score: 18    End of Session Equipment Utilized During Treatment: Gait belt  OT Visit Diagnosis:  Other abnormalities of gait and mobility (R26.89);Muscle weakness (generalized) (M62.81);History of falling (Z91.81);Other symptoms and signs involving cognitive function   Activity Tolerance Patient limited by pain   Patient Left in chair;with call bell/phone within reach;with chair alarm set;with nursing/sitter in room (nursing tech replacing condom cath)   Nurse Communication          Time: [id number] OT Time Calculation (min): 25 min  Charges: OT General Charges $OT Visit: 1 Visit OT Treatments $Self Care/Home Management : 23-37 mins  02/05/2020  Rich, OTR/L  Acute Rehabilitation Services  Office:  [place] 02/05/2020, 5:19 PM

## 2020-02-05 NOTE — Progress Notes (Signed)
Subjective: [name] is a 69 year old man with past medical history of alcohol use disorder, CAD, HTN, alcoholic hepatitis, iron deficiency anemia, and benign essential tremors who presented to the ED on 01/28/20 found to have high-risk alcohol withdrawal.  Overnight, no acute events.  This morning, he reports that he feels great and denies any new complaints. He states that he had a great session with physical therapy yesterday. He looks forward to continuing to work with physical and occupational therapy for further improvement of his condition.  Objective:  Vital signs in last 24 hours: Vitals:   02/05/20 0506 02/05/20 0828  BP: (!) 142/73 128/80  Pulse: 85 88  Resp: 16 18  Temp: 99.3 F (37.4 C) 98.7 F (37.1 C)  SpO2: 97% 98%   SpO2: 98 % O2 Flow Rate (L/min): 0 L/min  Filed Weights   02/03/20 0358 02/04/20 0328 02/05/20 0506  Weight: 57.3 kg 55.4 kg 55.9 kg    Intake/Output Summary (Last 24 hours) at 02/05/2020 1105 Last data filed at 02/05/2020 0900 Gross per 24 hour  Intake 240 ml  Output 1650 ml  Net -1410 ml   General: NAD, nl appearance Cardiovascular: Tachycardic, regular rhythm. No murmurs or rubs Pulmonary : Effort normal, breath sounds  Abdominal: soft, nontender, bowel sounds present Musculoskeletal: no swelling or deformity Skin: Warm, dry  Labs:   CMP Latest Ref Rng & Units 02/05/2020 02/04/2020 02/03/2020  Glucose 70 - 99 mg/dL 96 108(H) 113(H)  BUN 8 - 23 mg/dL 11 14 10   Creatinine 0.61 - 1.24 mg/dL 1.04 1.25(H) 0.96  Sodium 135 - 145 mmol/L 137 137 138  Potassium 3.5 - 5.1 mmol/L 4.0 3.9 3.6  Chloride 98 - 111 mmol/L 104 104 103  CO2 22 - 32 mmol/L 23 24 25   Calcium 8.9 - 10.3 mg/dL 9.7 9.6 9.3  Total Protein 6.5 - 8.1 g/dL 6.8 6.1(L) 7.0  Total Bilirubin 0.3 - 1.2 mg/dL 0.9 0.7 0.9  Alkaline Phos 38 - 126 U/L 37(L) 43 38  AST 15 - 41 U/L 33 44(H) 63(H)  ALT 0 - 44 U/L 40 45(H) 56(H)   CBC Latest Ref Rng & Units 02/05/2020  02/04/2020 02/03/2020  WBC 4.0 - 10.5 K/uL 6.3 4.9 4.3  Hemoglobin 13.0 - 17.0 g/dL 11.0(L) 10.5(L) 11.9(L)  Hematocrit 39 - 52 % 33.8(L) 32.9(L) 36.8(L)  Platelets 150 - 400 K/uL 210 161 PLATELET CLUMPS NOTED ON SMEAR, UNABLE TO ESTIMATE   Imaging: No new imaging  Assessment/Plan:  Principal Problem:   Alcohol withdrawal (HCC) Active Problems:   Essential hypertension   Lactic acidosis   Alcohol use disorder, severe, in early remission [facility])   Rhabdomyolysis  [name] with PMH of alcohol use disorder, CAD, HTN, alcoholic hepatitis, iron deficiency anemia, and benign essential tremors who presented to the ED symptoms consistent with ETOH withdraw and admitted for lactic acidosis and alcohol withdrawal.  #Generalized weakness, active #Functional incontinence, active Following resolution of patient's alcohol withdrawal and rhabdomyolysis, patient continues to have significant weakness and gait impairment complicated by functional incontinence. He has been evaluated by PT and OT. He requires moderate to maximum assistance for mobility tasks. PT and OT have recommended SNF vs. supervision/assistance 24 hours. As patient lives alone and has most of his support from his niece, he would benefit from SNF placement. -Awaiting SNF placement, pending bed offers -Requires wheelchair and wheelchair cushion upon discharge -Continue PT and OT while admitted  #AKI, resolved Creatinine improved  to baseline following IVF yesterday.  #High risk alcohol withdrawal, resolved Off CIWA with ativan and librium for several days. He denies any symptoms of withdrawal. AST and ALT now within normal limits. -Thiamine, folic acid, multivitamin supplementation -Telemetry -Plan to discharge with naltrexone  -Daily CMP  #Hypokalemia, stable Potassium 4.0 on morning BMP. Receiving potassium supplementation. -Schedule 78mEq potassium chloride twice daily -Daily CMP  #Rhabdomyolysis,  resolved CK 329 down from 555 several days ago.  #HTN, chronic Normotensive this morning on home regimen -Continue home medication regimen (metoprolol 25mg  twice daily, lisinopril 10mg  daily, amlodipine 10mg  daily)  #Microcytic anemia, chronic Hgb stable on iron supplementation at home. -Continue iron supplementation -Daily CBC   #BPH, chronic  -Continue Flomax 0.4 mg daily  #CAD, chronic Patient with a history of angina on nitroglycerin as needed for chest pain at home. No chest pain today. --Nitroglycerin 0.4 mg sublingual q57min prn for chest pain  Cato Mulligan, MD 02/05/2020, 11:05 AM Pager: [phone] After 5pm on weekdays and 1pm on weekends: On Call pager [phone]

## 2020-02-05 NOTE — TOC Progression Note (Signed)
Transition of Care [facility]) - Progression Note    Patient Details  Name: Dan Henry MRN: [id number] Date of Birth: 1950/05/14  Transition of Care [facility]) CM/SW [place], [place] Phone Number: 02/05/2020, 10:24 AM  Clinical Narrative:     Patient has no bed offers.  CSW will continue to follow and assist with discharge planning.  Thurmond Butts, MSW, [place] Clinical Social Worker   Expected Discharge Plan: Skilled Nursing Facility Barriers to Discharge: Ship broker, SNF Pending bed offer, Continued Medical Work up  Expected Discharge Plan and Services Expected Discharge Plan: [place] arrangements for the past 2 months: Single Family Home Expected Discharge Date: 01/31/20                                     Social Determinants of Health (SDOH) Interventions    Readmission Risk Interventions No flowsheet data found.

## 2020-02-06 ENCOUNTER — Inpatient Hospital Stay (HOSPITAL_COMMUNITY): Payer: Medicare HMO

## 2020-02-06 DIAGNOSIS — M79672 Pain in left foot: Secondary | ICD-10-CM

## 2020-02-06 DIAGNOSIS — R2689 Other abnormalities of gait and mobility: Secondary | ICD-10-CM | POA: Diagnosis not present

## 2020-02-06 DIAGNOSIS — R531 Weakness: Secondary | ICD-10-CM | POA: Diagnosis not present

## 2020-02-06 DIAGNOSIS — K7010 Alcoholic hepatitis without ascites: Secondary | ICD-10-CM | POA: Diagnosis not present

## 2020-02-06 DIAGNOSIS — F10239 Alcohol dependence with withdrawal, unspecified: Secondary | ICD-10-CM | POA: Diagnosis not present

## 2020-02-06 LAB — COMPREHENSIVE METABOLIC PANEL
ALT: 36 U/L (ref 0–44)
AST: 30 U/L (ref 15–41)
Albumin: 3.5 g/dL (ref 3.5–5.0)
Alkaline Phosphatase: 42 U/L (ref 38–126)
Anion gap: 9 (ref 5–15)
BUN: 10 mg/dL (ref 8–23)
CO2: 22 mmol/L (ref 22–32)
Calcium: 9.8 mg/dL (ref 8.9–10.3)
Chloride: 106 mmol/L (ref 98–111)
Creatinine, Ser: 0.96 mg/dL (ref 0.61–1.24)
GFR calc non Af Amer: >60 mL/min (ref >60)
Glucose, Bld: 105 mg/dL — ABNORMAL HIGH (ref 70–99)
Potassium: 4.1 mmol/L (ref 3.5–5.1)
Sodium: 137 mmol/L (ref 135–145)
Total Bilirubin: 1 mg/dL (ref 0.3–1.2)
Total Protein: 7.1 g/dL (ref 6.5–8.1)

## 2020-02-06 LAB — CBC
HCT: 35 % — ABNORMAL LOW (ref 39.0–52.0)
Hemoglobin: 11.5 g/dL — ABNORMAL LOW (ref 13.0–17.0)
MCH: 28 pg (ref 26.0–34.0)
MCHC: 32.9 g/dL (ref 30.0–36.0)
MCV: 85.2 fL (ref 80.0–100.0)
Platelets: 206 10*3/uL (ref 150–400)
RBC: 4.11 MIL/uL — ABNORMAL LOW (ref 4.22–5.81)
RDW: 22.2 % — ABNORMAL HIGH (ref 11.5–15.5)
WBC: 6.8 10*3/uL (ref 4.0–10.5)
nRBC: 0 % (ref 0.0–0.2)

## 2020-02-06 MED ORDER — CHLORDIAZEPOXIDE HCL 25 MG PO CAPS
25.0000 mg | ORAL_CAPSULE | Freq: Once | ORAL | Status: AC
  Administered 2020-02-06: 25 mg via ORAL
  Filled 2020-02-06: qty 1

## 2020-02-06 NOTE — Progress Notes (Signed)
Physical Therapy Treatment Patient Details Name: Dan Henry MRN: [id number] DOB: 1950/05/14 Today's Date: 02/06/2020    History of Present Illness Pt is a 69 y/o male admitted secondary to alcohol withdrawal. Pt also with alcoholic hepatitis. PMH includes alcohol abuse, HTN, CAD.    PT Comments    Pt supine in bed on arrival.  He is very worried about missing " an appointment" at the tax office.  Assisted patient in calling the [facility] Tax Office post tx.  Continue to recommend SNF placement at d/c based on poor balance.  Pt resting in recliner with chair alarm set.    Follow Up Recommendations  SNF;Supervision/Assistance - 24 hour     Equipment Recommendations  Rolling walker with 5" wheels;3in1 (PT)    Recommendations for Other Services       Precautions / Restrictions Precautions Precautions: Fall Restrictions Other Position/Activity Restrictions: patient had c/o pain to L forefoot.  Appears swollen and tender to the touch.    Mobility  Bed Mobility Overal bed mobility: Needs Assistance Bed Mobility: Supine to Sit;Sit to Supine     Supine to sit: Min guard     General bed mobility comments: Min guard to rise in to sitting edge of bed.  Transfers Overall transfer level: Needs assistance Equipment used: Rolling walker (2 wheeled) Transfers: Sit to/from Stand Sit to Stand: Min assist         General transfer comment: Pt unsuccessful initially required multimodal cues for hand placement and forward weight shifting to rise into standing.  Ambulation/Gait Ambulation/Gait assistance: Min assist;Mod assist Gait Distance (Feet): 140 Feet Assistive device: Rolling walker (2 wheeled) Gait Pattern/deviations: Step-through pattern;Decreased stride length;Trunk flexed;Narrow base of support     General Gait Details: Cues for sequencing and RW safety.  Pt required assistance to weight shift R.  Pt with poor obstacle negotiation running into obstacles on the L  side.   Stairs             Wheelchair Mobility    Modified Rankin (Stroke Patients Only)       Balance Overall balance assessment: Needs assistance Sitting-balance support: Feet supported;Single extremity supported Sitting balance-Leahy Scale: Fair       Standing balance-Leahy Scale: Poor                              Cognition Arousal/Alertness: Awake/alert Behavior During Therapy: Flat affect Overall Cognitive Status: No family/caregiver present to determine baseline cognitive functioning                                 General Comments: Pt continues to be slow to follow commands and presents with decreased awareness of his deficits.  Pt reports he needs to leave the hospital for a tax appointment so he does not lose his home.  PTA assisted patient in [place] tax dept and he left message with the tax office.      Exercises      General Comments        Pertinent Vitals/Pain Pain Assessment: 0-10 Pain Location: L foot, lists to the R to offload Pain Intervention(s): Monitored during session;Repositioned    Home Living                      Prior Function            PT Goals (  current goals can now be found in the care plan section) Acute Rehab PT Goals Patient Stated Goal: keeping getting better. Potential to Achieve Goals: Fair Progress towards PT goals: Progressing toward goals    Frequency    Min 2X/week      PT Plan Current plan remains appropriate    Co-evaluation              AM-PAC PT "6 Clicks" Mobility   Outcome Measure  Help needed turning from your back to your side while in a flat bed without using bedrails?: A Little Help needed moving from lying on your back to sitting on the side of a flat bed without using bedrails?: A Little Help needed moving to and from a bed to a chair (including a wheelchair)?: A Little Help needed standing up from a chair using your arms (e.g.,  wheelchair or bedside chair)?: A Little Help needed to walk in hospital room?: A Lot Help needed climbing 3-5 steps with a railing? : A Lot 6 Click Score: 16    End of Session Equipment Utilized During Treatment: Gait belt Activity Tolerance: Patient tolerated treatment well Patient left: in chair;with call bell/phone within reach;with chair alarm set Nurse Communication: Mobility status PT Visit Diagnosis: Unsteadiness on feet (R26.81);Muscle weakness (generalized) (M62.81);Difficulty in walking, not elsewhere classified (R26.2)     Time: 1131-1200 PT Time Calculation (min) (ACUTE ONLY): 29 min  Charges:  $Gait Training: 8-22 mins $Therapeutic Activity: 8-22 mins                     [name] , PTA Acute Rehabilitation Services Pager [phone] Office [phone]     Roselynn Whitacre Eli Hose 02/06/2020, 12:11 PM

## 2020-02-06 NOTE — Progress Notes (Signed)
Subjective: [name] is a 69 year old man with past medical history of alcohol use disorder, CAD, HTN, alcoholic hepatitis, iron deficiency anemia, and benign essential tremors who presented to the ED on 01/28/20 found to have high-risk alcohol withdrawal.  Overnight, no acute events.  This morning, patient reports that he did not have a good night. He sates that mittens were put on his hands because he wanted to get out of bed. He reports that he has pain on the plantar aspect of his left foot. Otherwise, denies any chest pain, shortness of breath, abdominal pain, nausea, vomiting.  Objective:  Vital signs in last 24 hours: Vitals:   02/06/20 0006 02/06/20 0527  BP: 117/64 (!) 161/83  Pulse: 69 (!) 106  Resp: 16 18  Temp: 99.1 F (37.3 C) 98.4 F (36.9 C)  SpO2: 98% 99%   SpO2: 99 % O2 Flow Rate (L/min): 0 L/min  Filed Weights   02/04/20 0100 02/04/20 0328 02/05/20 0506  Weight: (P) 54.9 kg 55.4 kg 55.9 kg    Intake/Output Summary (Last 24 hours) at 02/06/2020 3893 Last data filed at 02/06/2020 0250 Gross per 24 hour  Intake 240 ml  Output 575 ml  Net -335 ml   General: NAD, nl appearance Cardiovascular: Tachycardic, regular rhythm. No murmurs or rubs Pulmonary : Effort normal, breath sounds  Abdominal: soft, nontender, bowel sounds present Musculoskeletal: Tenderness to palpation on the plantar surface of the left foot. No overlying erythema or deformity. Neuro: Tremulous, alert and oriented to person place and time. No asterixis. Skin: Warm, dry  Labs:   CMP Latest Ref Rng & Units 02/06/2020 02/05/2020 02/04/2020  Glucose 70 - 99 mg/dL 105(H) 96 108(H)  BUN 8 - 23 mg/dL 10 11 14   Creatinine 0.61 - 1.24 mg/dL 0.96 1.04 1.25(H)  Sodium 135 - 145 mmol/L 137 137 137  Potassium 3.5 - 5.1 mmol/L 4.1 4.0 3.9  Chloride 98 - 111 mmol/L 106 104 104  CO2 22 - 32 mmol/L 22 23 24   Calcium 8.9 - 10.3 mg/dL 9.8 9.7 9.6  Total Protein 6.5 - 8.1 g/dL 7.1 6.8 6.1(L)   Total Bilirubin 0.3 - 1.2 mg/dL 1.0 0.9 0.7  Alkaline Phos 38 - 126 U/L 42 37(L) 43  AST 15 - 41 U/L 30 33 44(H)  ALT 0 - 44 U/L 36 40 45(H)   CBC Latest Ref Rng & Units 02/06/2020 02/05/2020 02/04/2020  WBC 4.0 - 10.5 K/uL 6.8 6.3 4.9  Hemoglobin 13.0 - 17.0 g/dL 11.5(L) 11.0(L) 10.5(L)  Hematocrit 39 - 52 % 35.0(L) 33.8(L) 32.9(L)  Platelets 150 - 400 K/uL 206 210 161   Imaging: No new imaging  Assessment/Plan:  Principal Problem:   Alcohol withdrawal ([place]) Active Problems:   Essential hypertension   Lactic acidosis   Alcohol use disorder, severe, in early remission [facility])   Rhabdomyolysis  [name] with PMH of alcohol use disorder, CAD, HTN, alcoholic hepatitis, iron deficiency anemia, and benign essential tremors who presented to the ED symptoms consistent with ETOH withdraw and admitted for lactic acidosis and alcohol withdrawal.  #Generalized weakness, active #Functional incontinence, active Following resolution of patient's alcohol withdrawal and rhabdomyolysis, patient continued to have significant weakness and gait impairment complicated by functional incontinence. He has been evaluated by PT and OT. He requires moderate to maximum assistance for mobility tasks. PT and OT have recommended SNF vs. supervision/assistance 24 hours. As patient lives alone and has most of his support from his  niece, he would benefit from SNF placement. He has received no bed offers at this moment. -Awaiting SNF placement, pending bed offers -Requires wheelchair and wheelchair cushion upon discharge -Continue PT and OT while admitted  #High risk alcohol withdrawal, resolved Off CIWA with ativan and librium for several days. He denies any symptoms of withdrawal. AST and ALT now within normal limits. -Thiamine, folic acid, multivitamin supplementation -Telemetry -Plan to discharge with naltrexone  -Daily CMP  #Foot pain, left, active Patient reports pain to the bottom  of his left foot. Sole of foot is tender to palpation on physical examination. -Obtain radiograph of left foot to rule out fracture   #Hypokalemia, stable Potassium 4.1 on morning BMP. Receiving twice daily potassium supplementation. -Continue 55mEq potassium chloride twice daily -Daily CMP  #HTN, chronic Mildly hypertensive this morning on home regimen -Continue home medication regimen (metoprolol 25mg  twice daily, lisinopril 10mg  daily, amlodipine 10mg  daily)  #Microcytic anemia, chronic Hgb stable on iron supplementation at home. -Continue iron supplementation -Daily CBC   #BPH, chronic  -Continue Flomax 0.4 mg daily  #CAD, chronic Patient with a history of angina on nitroglycerin as needed for chest pain at home. No chest pain today. --Nitroglycerin 0.4 mg sublingual q69min prn for chest pain  Code status: Full Code IVF: None Diet: Heart healthy VTE ppx: Lovenox  Cato Mulligan, MD 02/06/2020, 6:27 AM Pager: [phone] After 5pm on weekdays and 1pm on weekends: On Call pager [phone]

## 2020-02-06 NOTE — Consult Note (Signed)
   [facility] Inpatient Consult   02/06/2020  Dan Henry 22-May-1950 [id number]   [facility] [ACO] Patient: [name]   Patient screened for length of stay hospitalization and to check for potential [place] Management service needs.  Review of patient's medical record reveals patient is being recommended for a skilled nursing level of care. Also, patient has access to Chronic Care Management at the [facility] Internal Medicine clinic however,  patient has care goals to meet for this program to be in effect [see Chronic Care Management's notes 07/25/19 for details].  Patient is also noted to be in the [place] [SNP] for care management.  Primary Care Provider is [name], MD this provider is listed to provide the transition of care [TOC] for post hospital follow up. *Patient is eligible for care management in another Care Management program.  No [facility] Care Management needs.    For questions contact:   Natividad Brood, RN BSN [facility] Liaison  [phone] business mobile phone Toll free office [phone]  Fax number: [phone] [email or web address]

## 2020-02-07 DIAGNOSIS — R531 Weakness: Secondary | ICD-10-CM | POA: Diagnosis not present

## 2020-02-07 DIAGNOSIS — R2689 Other abnormalities of gait and mobility: Secondary | ICD-10-CM | POA: Diagnosis not present

## 2020-02-07 DIAGNOSIS — K7010 Alcoholic hepatitis without ascites: Secondary | ICD-10-CM | POA: Diagnosis not present

## 2020-02-07 DIAGNOSIS — F10239 Alcohol dependence with withdrawal, unspecified: Secondary | ICD-10-CM | POA: Diagnosis not present

## 2020-02-07 LAB — COMPREHENSIVE METABOLIC PANEL
ALT: 34 U/L (ref 0–44)
AST: 28 U/L (ref 15–41)
Albumin: 3.5 g/dL (ref 3.5–5.0)
Alkaline Phosphatase: 40 U/L (ref 38–126)
Anion gap: 10 (ref 5–15)
BUN: 15 mg/dL (ref 8–23)
CO2: 22 mmol/L (ref 22–32)
Calcium: 9.8 mg/dL (ref 8.9–10.3)
Chloride: 107 mmol/L (ref 98–111)
Creatinine, Ser: 1.12 mg/dL (ref 0.61–1.24)
GFR calc non Af Amer: >60 mL/min (ref >60)
Glucose, Bld: 106 mg/dL — ABNORMAL HIGH (ref 70–99)
Potassium: 4.6 mmol/L (ref 3.5–5.1)
Sodium: 139 mmol/L (ref 135–145)
Total Bilirubin: 0.5 mg/dL (ref 0.3–1.2)
Total Protein: 7.2 g/dL (ref 6.5–8.1)

## 2020-02-07 LAB — CBC
HCT: 37.3 % — ABNORMAL LOW (ref 39.0–52.0)
Hemoglobin: 12 g/dL — ABNORMAL LOW (ref 13.0–17.0)
MCH: 27.9 pg (ref 26.0–34.0)
MCHC: 32.2 g/dL (ref 30.0–36.0)
MCV: 86.7 fL (ref 80.0–100.0)
Platelets: 273 10*3/uL (ref 150–400)
RBC: 4.3 MIL/uL (ref 4.22–5.81)
RDW: 21.8 % — ABNORMAL HIGH (ref 11.5–15.5)
WBC: 7.1 10*3/uL (ref 4.0–10.5)
nRBC: 0 % (ref 0.0–0.2)

## 2020-02-07 MED ORDER — POTASSIUM CHLORIDE CRYS ER 20 MEQ PO TBCR
20.0000 meq | EXTENDED_RELEASE_TABLET | Freq: Every day | ORAL | Status: DC
  Administered 2020-02-07 – 2020-02-10 (×4): 20 meq via ORAL
  Filled 2020-02-07 (×4): qty 1

## 2020-02-07 MED ORDER — ENSURE ENLIVE PO LIQD
237.0000 mL | Freq: Three times a day (TID) | ORAL | Status: DC
  Administered 2020-02-07 – 2020-02-13 (×18): 237 mL via ORAL

## 2020-02-07 NOTE — Progress Notes (Signed)
Pts niece called asking if the day shift MD could call and give her an update. Niece's name is Elai Vanwyk and she can be contacted at [phone].

## 2020-02-07 NOTE — Progress Notes (Signed)
Pt tried to get out of bed, HR reached 141. After returning to bed HR returned to 90s. CCMD saved a strip. Will continue to monitor.  Adella Hare, RN

## 2020-02-07 NOTE — Progress Notes (Signed)
Initial Nutrition Assessment  DOCUMENTATION CODES:   Non-severe (moderate) malnutrition in context of chronic illness  INTERVENTION:    Ensure Enlive po TID, each supplement provides 350 kcal and 20 grams of protein  MVI daily  NUTRITION DIAGNOSIS:   Moderate Malnutrition related to chronic illness (ETOH hepatitis) as evidenced by mild fat depletion, severe muscle depletion.  GOAL:   Patient will meet greater than or equal to 90% of their needs  MONITOR:   PO intake, Supplement acceptance, Weight trends, Labs, I & O's  REASON FOR ASSESSMENT:   Consult Assessment of nutrition requirement/status  ASSESSMENT:   Patient with PMH significant for ETOH use, CAD, HTN, alcoholic hepatitis, IDA, and benign essential tremors. Presents this admission with ETOH withdrawal and rhabdomyolysis.   Pt denies loss in appetite PTA. States he typically eat three meals daily but able to elaborate on meal composition. Reports last alcoholic beverage was three months ago. Drinks Ensure Enlive at home TID. Appetite this admission documented as 75-100% for each meals. Pt would like to have Ensure sent to him.   Reports a UBW of 123 lb and endorses a recent wt gain of 3-5 lbs. Records indicate pt's wt has fluctuated from 121-128 lb over the last year.   Medications: folic acid, MVI with minerals, 20 mEq KCl daily, thiamine Labs: CBG 129-191  NUTRITION - FOCUSED PHYSICAL EXAM:    Most Recent Value  Orbital Region Mild depletion  Upper Arm Region Mild depletion  Thoracic and Lumbar Region Unable to assess  Buccal Region Mild depletion  Temple Region Moderate depletion  Clavicle Bone Region Moderate depletion  Clavicle and Acromion Bone Region Moderate depletion  Scapular Bone Region Unable to assess  Dorsal Hand Mild depletion  Patellar Region Severe depletion  Anterior Thigh Region Severe depletion  Posterior Calf Region Severe depletion  Edema (RD Assessment) None  Hair Reviewed  Eyes  Reviewed  Mouth Reviewed  Skin Reviewed  Nails Reviewed     Diet Order:   Diet Order            Diet Heart Room service appropriate? Yes with Assist; Fluid consistency: Thin  Diet effective now                 EDUCATION NEEDS:   Not appropriate for education at this time  Skin:  Skin Assessment: Reviewed RN Assessment  Last BM:  10/7  Height:   Ht Readings from Last 1 Encounters:  01/28/20 5\' 5"  (1.651 m)    Weight:   Wt Readings from Last 1 Encounters:  02/07/20 55.5 kg    BMI:  Body mass index is 20.36 kg/m.  Estimated Nutritional Needs:   Kcal:  1700-1900 kcal  Protein:  85-100 g  Fluid:  >/= 1.7 L/day  Mariana Single RD, LDN Clinical Nutrition Pager listed in [place]

## 2020-02-07 NOTE — Progress Notes (Signed)
Subjective: Dan Henry is a 69 year old man with past medical history of alcohol use disorder, CAD, HTN, alcoholic hepatitis, iron deficiency anemia, and benign essential tremors who presented to the ED on 01/28/20 found to have high-risk alcohol withdrawal.  Overnight, patient attempted to get out of bed and had brief episode of tachycardia.  This morning, patient reports he is feeling good. His foot pain has improved today. Discussed plan to call his niece after rounds to give updates. He is aware SW is working on SNF placement.   Objective:  Vital signs in last 24 hours: Vitals:   02/07/20 0602 02/07/20 0843  BP:  (!) 150/86  Pulse:  82  Resp: 18 18  Temp:  98.3 F (36.8 C)  SpO2:  98%   SpO2: 98 % O2 Flow Rate (L/min): 0 L/min  Filed Weights   02/04/20 0328 02/05/20 0506 02/07/20 0602  Weight: 55.4 kg 55.9 kg 55.5 kg    Intake/Output Summary (Last 24 hours) at 02/07/2020 1206 Last data filed at 02/07/2020 0800 Gross per 24 hour  Intake 360 ml  Output 800 ml  Net -440 ml   General: NAD, nl appearance Cardiovascular: Normal heart rate, regular rhythm. No murmurs or rubs Pulmonary : Effort normal, breath sounds  Abdominal: soft, nontender, bowel sounds present Musculoskeletal: Tenderness to palpation on the plantar surface of the left foot. No overlying erythema or deformity. Neuro: not tremulous, alert and oriented to person place and time. No asterixis. Skin: Warm, dry  Labs:   CMP Latest Ref Rng & Units 02/07/2020 02/06/2020 02/05/2020  Glucose 70 - 99 mg/dL 106(H) 105(H) 96  BUN 8 - 23 mg/dL 15 10 11   Creatinine 0.61 - 1.24 mg/dL 1.12 0.96 1.04  Sodium 135 - 145 mmol/L 139 137 137  Potassium 3.5 - 5.1 mmol/L 4.6 4.1 4.0  Chloride 98 - 111 mmol/L 107 106 104  CO2 22 - 32 mmol/L 22 22 23   Calcium 8.9 - 10.3 mg/dL 9.8 9.8 9.7  Total Protein 6.5 - 8.1 g/dL 7.2 7.1 6.8  Total Bilirubin 0.3 - 1.2 mg/dL 0.5 1.0 0.9  Alkaline Phos 38 - 126 U/L 40 42 37(L)   AST 15 - 41 U/L 28 30 33  ALT 0 - 44 U/L 34 36 40   CBC Latest Ref Rng & Units 02/07/2020 02/06/2020 02/05/2020  WBC 4.0 - 10.5 K/uL 7.1 6.8 6.3  Hemoglobin 13.0 - 17.0 g/dL 12.0(L) 11.5(L) 11.0(L)  Hematocrit 39 - 52 % 37.3(L) 35.0(L) 33.8(L)  Platelets 150 - 400 K/uL 273 206 210   Imaging: No new imaging  Assessment/Plan:  Principal Problem:   Alcohol withdrawal ([place]) Active Problems:   Essential hypertension   Lactic acidosis   Alcohol use disorder, severe, in early remission [facility])   Rhabdomyolysis  Dan Henry is a 69 yo male with PMH of alcohol use disorder, CAD, HTN, alcoholic hepatitis, iron deficiency anemia, and benign essential tremors who presented to the ED symptoms consistent with ETOH withdraw and admitted for lactic acidosis and alcohol withdrawal.  #Generalized weakness, active #Functional incontinence, active Following resolution of patient's alcohol withdrawal and rhabdomyolysis, patient continued to have significant weakness and gait impairment complicated by functional incontinence. He has been evaluated by PT and OT. He requires moderate to maximum assistance for mobility tasks. PT and OT have recommended SNF vs. supervision/assistance 24 hours. As patient lives alone and has most of his support from his niece, he would benefit from SNF placement. He has received no bed  offers at this moment. -Awaiting SNF placement, pending bed offers -Requires rolling walker with 5" wheels; 3 in 1 (PT) -Continue PT and OT while admitted  #High risk alcohol withdrawal, resolved Off CIWA with ativan and librium for several days. He denies any symptoms of withdrawal. AST and ALT now within normal limits. -Thiamine, folic acid, multivitamin supplementation -Telemetry -Plan to discharge with naltrexone  -Daily CMP  #Foot pain, left, improving Patient reports pain to the bottom of his left foot. Sole of foot is tender to palpation on physical examination. Radiograph  reveals no acute fracture of dislocation.  #Hypokalemia, resolved Potassium 4.6 on morning BMP. Decrease potassium supplementation to 59mEq once daily -KCl 23mEq once daily -Daily CMP  #HTN, chronic Mildly hypertensive this morning on home regimen -Continue home medication regimen (metoprolol 25mg  twice daily, lisinopril 10mg  daily, amlodipine 10mg  daily)  #Microcytic anemia, chronic Hgb stable on iron supplementation at home. -Continue iron supplementation -Daily CBC   #BPH, chronic  -Continue Flomax 0.4 mg daily  #CAD, chronic Patient with a history of angina on nitroglycerin as needed for chest pain at home. No chest pain today. --Nitroglycerin 0.4 mg sublingual q66min prn for chest pain  Code status: Full Code IVF: None Diet: Heart healthy VTE ppx: Lovenox  Cato Mulligan, MD 02/07/2020, 12:06 PM Pager: [phone] After 5pm on weekdays and 1pm on weekends: On Call pager [phone]

## 2020-02-08 ENCOUNTER — Encounter (HOSPITAL_COMMUNITY): Payer: Self-pay | Admitting: Student in an Organized Health Care Education/Training Program

## 2020-02-08 LAB — CBC
HCT: 37.3 % — ABNORMAL LOW (ref 39.0–52.0)
Hemoglobin: 12 g/dL — ABNORMAL LOW (ref 13.0–17.0)
MCH: 27.9 pg (ref 26.0–34.0)
MCHC: 32.2 g/dL (ref 30.0–36.0)
MCV: 86.7 fL (ref 80.0–100.0)
Platelets: 300 10*3/uL (ref 150–400)
RBC: 4.3 MIL/uL (ref 4.22–5.81)
RDW: 21.3 % — ABNORMAL HIGH (ref 11.5–15.5)
WBC: 7.1 10*3/uL (ref 4.0–10.5)
nRBC: 0 % (ref 0.0–0.2)

## 2020-02-08 LAB — COMPREHENSIVE METABOLIC PANEL
ALT: 33 U/L (ref 0–44)
AST: 32 U/L (ref 15–41)
Albumin: 3.4 g/dL — ABNORMAL LOW (ref 3.5–5.0)
Alkaline Phosphatase: 44 U/L (ref 38–126)
Anion gap: 10 (ref 5–15)
BUN: 27 mg/dL — ABNORMAL HIGH (ref 8–23)
CO2: 23 mmol/L (ref 22–32)
Calcium: 9.9 mg/dL (ref 8.9–10.3)
Chloride: 106 mmol/L (ref 98–111)
Creatinine, Ser: 1.27 mg/dL — ABNORMAL HIGH (ref 0.61–1.24)
GFR, Estimated: 57 mL/min — ABNORMAL LOW (ref >60)
Glucose, Bld: 106 mg/dL — ABNORMAL HIGH (ref 70–99)
Potassium: 4.6 mmol/L (ref 3.5–5.1)
Sodium: 139 mmol/L (ref 135–145)
Total Bilirubin: 0.5 mg/dL (ref 0.3–1.2)
Total Protein: 7.2 g/dL (ref 6.5–8.1)

## 2020-02-08 NOTE — Progress Notes (Signed)
Subjective: [name] is a 69 year old man with past medical history of alcohol use disorder, CAD, HTN, alcoholic hepatitis, iron deficiency anemia, and benign essential tremors who presented to the ED on 01/28/20 found to have high-risk alcohol withdrawal.  This morning, patient reports he is not in a good mood. He is ready to go home or go to SNF. We shared SW is working on placement and we hope he continues to get stronger while here. He is agreeable.   Objective:  Vital signs in last 24 hours: Vitals:   02/07/20 2333 02/08/20 0341  BP: 116/67 123/76  Pulse: 82 74  Resp: 16 16  Temp: 98.1 F (36.7 C) 97.8 F (36.6 C)  SpO2: 98% 100%   SpO2: 100 % O2 Flow Rate (L/min): 0 L/min  Filed Weights   02/05/20 0506 02/07/20 0602 02/08/20 0341  Weight: 55.9 kg 55.5 kg 56.1 kg    Intake/Output Summary (Last 24 hours) at 02/08/2020 0657 Last data filed at 02/08/2020 0347 Gross per 24 hour  Intake 840 ml  Output 250 ml  Net 590 ml   General: NAD, nl appearance Cardiovascular: Normal heart rate, regular rhythm. No murmurs or rubs Pulmonary : Effort normal, breath sounds  Abdominal: soft, nontender, bowel sounds present   Labs:   CMP Latest Ref Rng & Units 02/08/2020 02/07/2020 02/06/2020  Glucose 70 - 99 mg/dL 106(H) 106(H) 105(H)  BUN 8 - 23 mg/dL 27(H) 15 10  Creatinine 0.61 - 1.24 mg/dL 1.27(H) 1.12 0.96  Sodium 135 - 145 mmol/L 139 139 137  Potassium 3.5 - 5.1 mmol/L 4.6 4.6 4.1  Chloride 98 - 111 mmol/L 106 107 106  CO2 22 - 32 mmol/L 23 22 22   Calcium 8.9 - 10.3 mg/dL 9.9 9.8 9.8  Total Protein 6.5 - 8.1 g/dL 7.2 7.2 7.1  Total Bilirubin 0.3 - 1.2 mg/dL 0.5 0.5 1.0  Alkaline Phos 38 - 126 U/L 44 40 42  AST 15 - 41 U/L 32 28 30  ALT 0 - 44 U/L 33 34 36   CBC Latest Ref Rng & Units 02/08/2020 02/07/2020 02/06/2020  WBC 4.0 - 10.5 K/uL 7.1 7.1 6.8  Hemoglobin 13.0 - 17.0 g/dL 12.0(L) 12.0(L) 11.5(L)  Hematocrit 39 - 52 % 37.3(L) 37.3(L) 35.0(L)  Platelets 150 -  400 K/uL 300 273 206   Imaging: No new imaging  Assessment/Plan:  Principal Problem:   Alcohol withdrawal ([place]) Active Problems:   Essential hypertension   Lactic acidosis   Alcohol use disorder, severe, in early remission (HCC)   Malnutrition of moderate degree   Rhabdomyolysis  [name] with PMH of alcohol use disorder, CAD, HTN, alcoholic hepatitis, iron deficiency anemia, and benign essential tremors who presented to the ED symptoms consistent with ETOH withdraw and admitted for lactic acidosis and alcohol withdrawal.  #Generalized weakness, active #Functional incontinence, active Following resolution of patient's alcohol withdrawal and rhabdomyolysis, patient continued to have significant weakness and gait impairment complicated by functional incontinence. He has been evaluated by PT and OT. He requires moderate to maximum assistance for mobility tasks. PT and OT have recommended SNF vs. supervision/assistance 24 hours. As patient lives alone and has most of his support from his niece, he would benefit from SNF placement. He has received no bed offers at this moment. -Awaiting SNF placement, pending bed offers -Requires rolling walker with 5" wheels; 3 in 1 (PT) -Continue PT and OT while admitted  #High risk alcohol withdrawal, resolved  Off CIWA with ativan and librium for several days. He denies any symptoms of withdrawal. AST and ALT now within normal limits. -Thiamine, folic acid, multivitamin supplementation -DC Telemetry -Plan to discharge with naltrexone   #Foot pain, left, improving Patient reports pain to the bottom of his left foot. Sole of foot is tender to palpation on physical examination. Radiograph reveals no acute fracture of dislocation. - No further workup  #Hypokalemia, resolved Potassium 4.6 on morning BMP. Decrease potassium supplementation to 25mEq once daily -KCl 87mEq once daily -Daily CMP  #HTN, chronic Normotensive this  morning on home regimen -Continue home medication regimen (metoprolol 25mg  twice daily, lisinopril 10mg  daily, amlodipine 10mg  daily)  #Microcytic anemia, chronic Hgb stable on iron supplementation at home. -Continue iron supplementation -Daily CBC   #BPH, chronic  -Continue Flomax 0.4 mg daily  #CAD, chronic Patient with a history of angina on nitroglycerin as needed for chest pain at home. No chest pain today. --Nitroglycerin 0.4 mg sublingual q51min prn for chest pain  Code status: Full Code IVF: None Diet: Heart healthy VTE ppx: Lovenox  Madalyn Rob, MD 02/08/2020, 6:57 AM Pager: [phone] After 5pm on weekdays and 1pm on weekends: On Call pager [phone]

## 2020-02-08 NOTE — TOC Progression Note (Addendum)
Transition of Care [facility]) - Progression Note    Patient Details  Name: Dan Henry MRN: [id number] Date of Birth: 10/30/1950  Transition of Care [facility]) CM/SW [place], [place] Phone Number: 02/08/2020, 4:00 PM  Clinical Narrative:     CSW visit with the patient at bedside. CSW introduced self and explained role. CSW discussed PT recommendation of short term rehab at [facility]. Patient states he lives home alone. He is agreeable to SNF . CSW explained the SNF process and barriers with SNF placement (ETOH). CSW informed patient of no current bed offers. Advised CSW seeking SNF placement outside of [place] and in surrounding areas. CSW encourage patient to explore other care plans.  Patient states his niece, [name] visit with him and made him very upset and he requested that she leave. Patient requested CSW not to call his niece and to remove her name as his emergency contact person. Patient wants his neighbor [name] as emergency contact but he could not provide her number at this time. Patient states if he was to discharge home, his Aunt [name](that resides in [place]) will come and stay with him for a while. Patient does not have her contact number at this time but will provide later.  CSW contacted [facility], requested to review. SNF will review and inform CSW. Patient remains interested in SNF Placement- does not want to pursue ALF.  CSW will continue to follow and assist with discharge planning.   Thurmond Butts, MSW, [place] Clinical Social Worker   Expected Discharge Plan: Skilled Nursing Facility Barriers to Discharge: Ship broker, SNF Pending bed offer, Continued Medical Work up  Expected Discharge Plan and Services Expected Discharge Plan: [place] arrangements for the past 2 months: Single Family Home Expected Discharge Date: 01/31/20                                  Social Determinants of  Health (SDOH) Interventions    Readmission Risk Interventions No flowsheet data found.

## 2020-02-09 DIAGNOSIS — F10239 Alcohol dependence with withdrawal, unspecified: Secondary | ICD-10-CM | POA: Diagnosis not present

## 2020-02-09 DIAGNOSIS — K7010 Alcoholic hepatitis without ascites: Secondary | ICD-10-CM | POA: Diagnosis not present

## 2020-02-09 DIAGNOSIS — E440 Moderate protein-calorie malnutrition: Secondary | ICD-10-CM

## 2020-02-09 DIAGNOSIS — R2689 Other abnormalities of gait and mobility: Secondary | ICD-10-CM | POA: Diagnosis not present

## 2020-02-09 DIAGNOSIS — R531 Weakness: Secondary | ICD-10-CM | POA: Diagnosis not present

## 2020-02-09 LAB — COMPREHENSIVE METABOLIC PANEL
ALT: 31 U/L (ref 0–44)
AST: 31 U/L (ref 15–41)
Albumin: 3.2 g/dL — ABNORMAL LOW (ref 3.5–5.0)
Alkaline Phosphatase: 43 U/L (ref 38–126)
Anion gap: 7 (ref 5–15)
BUN: 23 mg/dL (ref 8–23)
CO2: 25 mmol/L (ref 22–32)
Calcium: 9.6 mg/dL (ref 8.9–10.3)
Chloride: 105 mmol/L (ref 98–111)
Creatinine, Ser: 1.07 mg/dL (ref 0.61–1.24)
GFR, Estimated: >60 mL/min (ref >60)
Glucose, Bld: 111 mg/dL — ABNORMAL HIGH (ref 70–99)
Potassium: 4.6 mmol/L (ref 3.5–5.1)
Sodium: 137 mmol/L (ref 135–145)
Total Bilirubin: 0.2 mg/dL — ABNORMAL LOW (ref 0.3–1.2)
Total Protein: 7.1 g/dL (ref 6.5–8.1)

## 2020-02-09 LAB — CBC
HCT: 35.9 % — ABNORMAL LOW (ref 39.0–52.0)
Hemoglobin: 11.6 g/dL — ABNORMAL LOW (ref 13.0–17.0)
MCH: 28 pg (ref 26.0–34.0)
MCHC: 32.3 g/dL (ref 30.0–36.0)
MCV: 86.5 fL (ref 80.0–100.0)
Platelets: 341 10*3/uL (ref 150–400)
RBC: 4.15 MIL/uL — ABNORMAL LOW (ref 4.22–5.81)
RDW: 21 % — ABNORMAL HIGH (ref 11.5–15.5)
WBC: 7.6 10*3/uL (ref 4.0–10.5)
nRBC: 0 % (ref 0.0–0.2)

## 2020-02-09 NOTE — Progress Notes (Signed)
Subjective: Mr. Conway Fedora is a 69 year old man with past medical history of alcohol use disorder, CAD, HTN, alcoholic hepatitis, iron deficiency anemia, and benign essential tremors who presented to the ED on 01/28/20 found to have high-risk alcohol withdrawal.  Patient reports he is feeling good today and denies any acute concerns. We discussed the conversation he had with his niece yesterday. She told him " you put yourself in here", this upset him and he did not want to talk to her anymore. We recommended spending the day in his chair and his RN was made aware. Reports his wrist and foot pain are improved. Denies any tremors.   Objective:  Vital signs in last 24 hours: Vitals:   02/08/20 1953 02/08/20 2358  BP: 138/76 110/62  Pulse: 82 77  Resp: 18 18  Temp: 97.8 F (36.6 C) 97.9 F (36.6 C)  SpO2: 100% 99%   SpO2: 99 % O2 Flow Rate (L/min): 0 L/min  Filed Weights   02/05/20 0506 02/07/20 0602 02/08/20 0341  Weight: 55.9 kg 55.5 kg 56.1 kg    Intake/Output Summary (Last 24 hours) at 02/09/2020 0603 Last data filed at 02/08/2020 1700 Gross per 24 hour  Intake 480 ml  Output 850 ml  Net -370 ml   General: NAD, nl appearance Cardiovascular: Normal heart rate, regular rhythm. No murmurs or rubs Pulmonary : Effort normal, breath sounds  Abdominal: soft, nontender, bowel sounds present   Labs:   CMP Latest Ref Rng & Units 02/09/2020 02/08/2020 02/07/2020  Glucose 70 - 99 mg/dL 111(H) 106(H) 106(H)  BUN 8 - 23 mg/dL 23 27(H) 15  Creatinine 0.61 - 1.24 mg/dL 1.07 1.27(H) 1.12  Sodium 135 - 145 mmol/L 137 139 139  Potassium 3.5 - 5.1 mmol/L 4.6 4.6 4.6  Chloride 98 - 111 mmol/L 105 106 107  CO2 22 - 32 mmol/L 25 23 22   Calcium 8.9 - 10.3 mg/dL 9.6 9.9 9.8  Total Protein 6.5 - 8.1 g/dL 7.1 7.2 7.2  Total Bilirubin 0.3 - 1.2 mg/dL 0.2(L) 0.5 0.5  Alkaline Phos 38 - 126 U/L 43 44 40  AST 15 - 41 U/L 31 32 28  ALT 0 - 44 U/L 31 33 34   CBC Latest Ref Rng & Units  02/09/2020 02/08/2020 02/07/2020  WBC 4.0 - 10.5 K/uL 7.6 7.1 7.1  Hemoglobin 13.0 - 17.0 g/dL 11.6(L) 12.0(L) 12.0(L)  Hematocrit 39 - 52 % 35.9(L) 37.3(L) 37.3(L)  Platelets 150 - 400 K/uL 341 300 273   Imaging: No new imaging  Assessment/Plan:  Principal Problem:   Alcohol withdrawal ([place]) Active Problems:   Essential hypertension   Lactic acidosis   Alcohol use disorder, severe, in early remission (HCC)   Malnutrition of moderate degree   Rhabdomyolysis  Mr. Danie Diehl is a 68 yo male with PMH of alcohol use disorder, CAD, HTN, alcoholic hepatitis, iron deficiency anemia, and benign essential tremors who presented to the ED symptoms consistent with ETOH withdraw and admitted for lactic acidosis and alcohol withdrawal.  #Generalized weakness, active #Functional incontinence, active Following resolution of patient's alcohol withdrawal and rhabdomyolysis, patient continued to have significant weakness and gait impairment complicated by functional incontinence. He has been evaluated by PT and OT. He requires moderate to maximum assistance for mobility tasks. PT and OT have recommended SNF vs. supervision/assistance 24 hours. As patient lives alone and has most of his support from his niece, he would benefit from SNF placement. - - Up in chair with assistance  -  Ensure TID - Continue to monitor daily while awaiting SNF placement, pending bed offers - Requires rolling walker with 5" wheels; 3 in 1 - current recommendations at DC  #High risk alcohol withdrawal, resolved Off CIWA with ativan and librium for several days. He denies any symptoms of withdrawal. AST and ALT now within normal limits. -Thiamine, folic acid, multivitamin supplementation -Plan to discharge with naltrexone   #HTN, chronic Normotensive on current regimen. -Continue home medication regimen (metoprolol 25mg  twice daily, lisinopril 10mg  daily, amlodipine 10mg  daily)  #Microcytic anemia, chronic Hgb stable on  iron supplementation at home. -Continue iron supplementation   #BPH, chronic  -Continue Flomax 0.4 mg daily  #CAD, chronic Patient with a history of angina on nitroglycerin as needed for chest pain at home. No chest pain today. --Nitroglycerin 0.4 mg sublingual q55min prn for chest pain  Code status: Full Code IVF: None Diet: Heart healthy VTE ppx: Lovenox  Disposition: Pending SNF placement  Madalyn Rob, MD 02/09/2020, 6:03 AM Pager: [phone] After 5pm on weekdays and 1pm on weekends: On Call pager [phone]

## 2020-02-09 NOTE — Progress Notes (Signed)
Patient assisted up to chair to eat breakfast. Patient with one assist to chair out of bed. Patient gait unsteady. Resting in chair call bell with in reach. Will monitor patient. Fergus Throne, Bettina Gavia RN

## 2020-02-10 ENCOUNTER — Inpatient Hospital Stay (HOSPITAL_COMMUNITY): Payer: Medicare HMO

## 2020-02-10 DIAGNOSIS — R2689 Other abnormalities of gait and mobility: Secondary | ICD-10-CM | POA: Diagnosis not present

## 2020-02-10 DIAGNOSIS — K7010 Alcoholic hepatitis without ascites: Secondary | ICD-10-CM | POA: Diagnosis not present

## 2020-02-10 DIAGNOSIS — R531 Weakness: Secondary | ICD-10-CM | POA: Diagnosis not present

## 2020-02-10 DIAGNOSIS — F10239 Alcohol dependence with withdrawal, unspecified: Secondary | ICD-10-CM | POA: Diagnosis not present

## 2020-02-10 LAB — CBC WITH DIFFERENTIAL/PLATELET
Abs Immature Granulocytes: 0.04 10*3/uL (ref 0.00–0.07)
Basophils Absolute: 0.1 10*3/uL (ref 0.0–0.1)
Basophils Relative: 1 %
Eosinophils Absolute: 0.2 10*3/uL (ref 0.0–0.5)
Eosinophils Relative: 2 %
HCT: 35.7 % — ABNORMAL LOW (ref 39.0–52.0)
Hemoglobin: 11.5 g/dL — ABNORMAL LOW (ref 13.0–17.0)
Immature Granulocytes: 0 %
Lymphocytes Relative: 11 %
Lymphs Abs: 1.1 10*3/uL (ref 0.7–4.0)
MCH: 27.6 pg (ref 26.0–34.0)
MCHC: 32.2 g/dL (ref 30.0–36.0)
MCV: 85.6 fL (ref 80.0–100.0)
Monocytes Absolute: 1 10*3/uL (ref 0.1–1.0)
Monocytes Relative: 9 %
Neutro Abs: 7.7 10*3/uL (ref 1.7–7.7)
Neutrophils Relative %: 77 %
Platelets: 335 10*3/uL (ref 150–400)
RBC: 4.17 MIL/uL — ABNORMAL LOW (ref 4.22–5.81)
RDW: 20.2 % — ABNORMAL HIGH (ref 11.5–15.5)
WBC: 10.1 10*3/uL (ref 4.0–10.5)
nRBC: 0 % (ref 0.0–0.2)

## 2020-02-10 LAB — URINALYSIS, COMPLETE (UACMP) WITH MICROSCOPIC
Bacteria, UA: NONE SEEN
Bilirubin Urine: NEGATIVE
Glucose, UA: NEGATIVE mg/dL
Hgb urine dipstick: NEGATIVE
Ketones, ur: NEGATIVE mg/dL
Leukocytes,Ua: NEGATIVE
Nitrite: NEGATIVE
Protein, ur: NEGATIVE mg/dL
Specific Gravity, Urine: 1.011 (ref 1.005–1.030)
pH: 6 (ref 5.0–8.0)

## 2020-02-10 LAB — COMPREHENSIVE METABOLIC PANEL
ALT: 28 U/L (ref 0–44)
AST: 25 U/L (ref 15–41)
Albumin: 3.3 g/dL — ABNORMAL LOW (ref 3.5–5.0)
Alkaline Phosphatase: 47 U/L (ref 38–126)
Anion gap: 9 (ref 5–15)
BUN: 21 mg/dL (ref 8–23)
CO2: 24 mmol/L (ref 22–32)
Calcium: 9.2 mg/dL (ref 8.9–10.3)
Chloride: 103 mmol/L (ref 98–111)
Creatinine, Ser: 1.25 mg/dL — ABNORMAL HIGH (ref 0.61–1.24)
GFR, Estimated: 58 mL/min — ABNORMAL LOW (ref >60)
Glucose, Bld: 106 mg/dL — ABNORMAL HIGH (ref 70–99)
Potassium: 4.6 mmol/L (ref 3.5–5.1)
Sodium: 136 mmol/L (ref 135–145)
Total Bilirubin: 0.5 mg/dL (ref 0.3–1.2)
Total Protein: 7.2 g/dL (ref 6.5–8.1)

## 2020-02-10 NOTE — Care Management Important Message (Signed)
Important Message  Patient Details  Name: Dan Henry MRN: [id number] Date of Birth: Nov 04, 1950   Medicare Important Message Given:  Yes     [name] 02/10/2020, 12:32 PM

## 2020-02-10 NOTE — Progress Notes (Signed)
Pt temp has been slightly elevated today.  Latest one at 1600 was 100.65F.  MDs notified, new orders being placed.

## 2020-02-10 NOTE — Progress Notes (Signed)
Subjective: [name] is a 69 year old man with past medical history of alcohol use disorder, CAD, HTN, alcoholic hepatitis, iron deficiency anemia, and benign essential tremors who presented to the ED on 01/28/20 found to have high-risk alcohol withdrawal.  Overnight, no acute events.  This morning, he reports that he slept well and feels good. He denies any pain this morning. He states that he has a strong appetite and is looking forward to breakfast. He feels that his strength is improving with continued therapy with PT and OT. He looks forward to discharging to the SNF.  Objective:  Vital signs in last 24 hours: Vitals:   02/10/20 0415 02/10/20 0752  BP: 112/76 118/68  Pulse: 72 81  Resp: 16   Temp: 98.2 F (36.8 C) 99.5 F (37.5 C)  SpO2: 97% 97%   SpO2: 97 % O2 Flow Rate (L/min): 0 L/min  Filed Weights   02/08/20 0341 02/09/20 0632 02/10/20 0636  Weight: 56.1 kg 56.3 kg 56.5 kg    Intake/Output Summary (Last 24 hours) at 02/10/2020 1034 Last data filed at 02/10/2020 0802 Gross per 24 hour  Intake 120 ml  Output 800 ml  Net -680 ml   General: NAD, nl appearance Cardiovascular: Normal heart rate, regular rhythm. No murmurs or rubs Pulmonary : Effort normal, breath sounds  Abdominal: soft, nontender, bowel sounds present  Labs:   CMP Latest Ref Rng & Units 02/09/2020 02/08/2020 02/07/2020  Glucose 70 - 99 mg/dL 111(H) 106(H) 106(H)  BUN 8 - 23 mg/dL 23 27(H) 15  Creatinine 0.61 - 1.24 mg/dL 1.07 1.27(H) 1.12  Sodium 135 - 145 mmol/L 137 139 139  Potassium 3.5 - 5.1 mmol/L 4.6 4.6 4.6  Chloride 98 - 111 mmol/L 105 106 107  CO2 22 - 32 mmol/L 25 23 22   Calcium 8.9 - 10.3 mg/dL 9.6 9.9 9.8  Total Protein 6.5 - 8.1 g/dL 7.1 7.2 7.2  Total Bilirubin 0.3 - 1.2 mg/dL 0.2(L) 0.5 0.5  Alkaline Phos 38 - 126 U/L 43 44 40  AST 15 - 41 U/L 31 32 28  ALT 0 - 44 U/L 31 33 34   CBC Latest Ref Rng & Units 02/09/2020 02/08/2020 02/07/2020  WBC 4.0 - 10.5 K/uL 7.6  7.1 7.1  Hemoglobin 13.0 - 17.0 g/dL 11.6(L) 12.0(L) 12.0(L)  Hematocrit 39 - 52 % 35.9(L) 37.3(L) 37.3(L)  Platelets 150 - 400 K/uL 341 300 273   Imaging: No new imaging  Assessment/Plan:  Principal Problem:   Alcohol withdrawal ([place]) Active Problems:   Essential hypertension   Lactic acidosis   Alcohol use disorder, severe, in early remission (HCC)   Malnutrition of moderate degree   Rhabdomyolysis  [name] with PMH of alcohol use disorder, CAD, HTN, alcoholic hepatitis, iron deficiency anemia, and benign essential tremors who presented to the ED symptoms consistent with ETOH withdraw and admitted for lactic acidosis and alcohol withdrawal.  #Generalized weakness, active #Functional incontinence, active Following resolution of patient's alcohol withdrawal and rhabdomyolysis, patient continued to have significant weakness and gait impairment complicated by functional incontinence. He has been evaluated by PT and OT. He requires moderate to maximum assistance for mobility tasks. PT and OT have recommended SNF vs. supervision/assistance 24 hours. As patient lives alone and has most of his support from his niece, he would benefit from SNF placement. Barriers to placement include his history of alcohol use and behavior in prior admissions. He remains very patient and cooperative while waiting  for bed availability. - Up in chair with assistance  - Ensure TID - Continue to monitor daily while awaiting SNF placement, pending bed offers - Requires rolling walker with 5" wheels; 3 in 1 - current recommendations at DC  #High risk alcohol withdrawal, resolved Off CIWA with ativan and librium. He denies any symptoms of withdrawal. AST and ALT within normal limits. -Thiamine, folic acid, multivitamin supplementation -Plan to discharge with naltrexone   #HTN, chronic Normotensive on current regimen. -Continue home medication regimen (metoprolol 25mg  twice daily,  lisinopril 10mg  daily, amlodipine 10mg  daily)  #Microcytic anemia, chronic Hgb stable throughout hospitalization on iron supplementation. -Continue iron supplementation   #BPH, chronic  -Continue Flomax 0.4 mg daily  #CAD, chronic Patient with a history of angina on nitroglycerin as needed for chest pain at home. No chest pain throughout hospitalization. --Nitroglycerin 0.4 mg sublingual q56min prn for chest pain  Code status: Full Code IVF: None Diet: Heart healthy VTE ppx: Lovenox  Disposition: Pending SNF placement  Cato Mulligan, MD 02/10/2020, 10:34 AM Pager: [phone] After 5pm on weekdays and 1pm on weekends: On Call pager [phone]

## 2020-02-11 DIAGNOSIS — N179 Acute kidney failure, unspecified: Secondary | ICD-10-CM | POA: Diagnosis not present

## 2020-02-11 DIAGNOSIS — K7010 Alcoholic hepatitis without ascites: Secondary | ICD-10-CM | POA: Diagnosis not present

## 2020-02-11 DIAGNOSIS — F10239 Alcohol dependence with withdrawal, unspecified: Secondary | ICD-10-CM | POA: Diagnosis not present

## 2020-02-11 DIAGNOSIS — R509 Fever, unspecified: Secondary | ICD-10-CM | POA: Diagnosis not present

## 2020-02-11 LAB — CBC WITH DIFFERENTIAL/PLATELET
Abs Immature Granulocytes: 0.05 10*3/uL (ref 0.00–0.07)
Basophils Absolute: 0.1 10*3/uL (ref 0.0–0.1)
Basophils Relative: 1 %
Eosinophils Absolute: 0.2 10*3/uL (ref 0.0–0.5)
Eosinophils Relative: 2 %
HCT: 35 % — ABNORMAL LOW (ref 39.0–52.0)
Hemoglobin: 11.2 g/dL — ABNORMAL LOW (ref 13.0–17.0)
Immature Granulocytes: 1 %
Lymphocytes Relative: 13 %
Lymphs Abs: 1.3 10*3/uL (ref 0.7–4.0)
MCH: 27.5 pg (ref 26.0–34.0)
MCHC: 32 g/dL (ref 30.0–36.0)
MCV: 86 fL (ref 80.0–100.0)
Monocytes Absolute: 1 10*3/uL (ref 0.1–1.0)
Monocytes Relative: 10 %
Neutro Abs: 7.4 10*3/uL (ref 1.7–7.7)
Neutrophils Relative %: 73 %
Platelets: 373 10*3/uL (ref 150–400)
RBC: 4.07 MIL/uL — ABNORMAL LOW (ref 4.22–5.81)
RDW: 20 % — ABNORMAL HIGH (ref 11.5–15.5)
WBC: 10 10*3/uL (ref 4.0–10.5)
nRBC: 0 % (ref 0.0–0.2)

## 2020-02-11 LAB — COMPREHENSIVE METABOLIC PANEL
ALT: 26 U/L (ref 0–44)
AST: 25 U/L (ref 15–41)
Albumin: 3.3 g/dL — ABNORMAL LOW (ref 3.5–5.0)
Alkaline Phosphatase: 46 U/L (ref 38–126)
Anion gap: 8 (ref 5–15)
BUN: 18 mg/dL (ref 8–23)
CO2: 26 mmol/L (ref 22–32)
Calcium: 9.3 mg/dL (ref 8.9–10.3)
Chloride: 104 mmol/L (ref 98–111)
Creatinine, Ser: 1.26 mg/dL — ABNORMAL HIGH (ref 0.61–1.24)
GFR, Estimated: 58 mL/min — ABNORMAL LOW (ref >60)
Glucose, Bld: 99 mg/dL (ref 70–99)
Potassium: 4.7 mmol/L (ref 3.5–5.1)
Sodium: 138 mmol/L (ref 135–145)
Total Bilirubin: 0.5 mg/dL (ref 0.3–1.2)
Total Protein: 7.2 g/dL (ref 6.5–8.1)

## 2020-02-11 MED ORDER — LACTATED RINGERS IV SOLN: INTRAVENOUS | Status: AC

## 2020-02-11 NOTE — TOC Progression Note (Signed)
Transition of Care [facility]) - Progression Note    Patient Details  Name: Dan Henry MRN: [id number] Date of Birth: 07/27/1950  Transition of Care [facility]) CM/SW [place], [place] Phone Number: 02/11/2020, 4:02 PM  Clinical Narrative:     Patient has no bed offers [facility] declined-  Barrier -ETOH  CSW will continue to follow and assist with discharge planning.  Thurmond Butts, MSW, [place] Clinical Social Worker   Expected Discharge Plan: Skilled Nursing Facility Barriers to Discharge: Ship broker, SNF Pending bed offer, Continued Medical Work up  Expected Discharge Plan and Services Expected Discharge Plan: [place] arrangements for the past 2 months: Single Family Home Expected Discharge Date: 01/31/20                                     Social Determinants of Health (SDOH) Interventions    Readmission Risk Interventions No flowsheet data found.

## 2020-02-11 NOTE — Progress Notes (Signed)
Subjective: Dan Henry is a 69 year old man with past medical history of alcohol use disorder, CAD, HTN, alcoholic hepatitis, iron deficiency anemia, and benign essential tremors who presented to the ED on 01/28/20 found to have high-risk alcohol withdrawal.  Yesterday afternoon, patient noted to have temperature to 100.4 despite no new symptoms. CBC, CMP, blood cultures, urinalysis and CXR obtained to workup underlying infection.  Overnight, no acute events.  This morning, he denies any cough, shortness of breath, worsening of leg pain. Denies dysuria. No joint pain. He feels well and does not have a complaint.  Objective:  Vital signs in last 24 hours: Vitals:   02/11/20 0900 02/11/20 1042  BP: 135/73 106/64  Pulse: 86 72  Resp:  18  Temp:  98.6 F (37 C)  SpO2:     SpO2: 100 % O2 Flow Rate (L/min): 0 L/min  Filed Weights   02/08/20 0341 02/09/20 0632 02/10/20 0636  Weight: 56.1 kg 56.3 kg 56.5 kg    Intake/Output Summary (Last 24 hours) at 02/11/2020 1447 Last data filed at 02/11/2020 1040 Gross per 24 hour  Intake 240 ml  Output 1535 ml  Net -1295 ml   General: NAD, nl appearance Cardiovascular: Normal heart rate, regular rhythm. No murmurs or rubs Pulmonary : Effort normal, breath sounds  Abdominal: soft, nontender, bowel sounds present  Labs:   CMP Latest Ref Rng & Units 02/11/2020 02/10/2020 02/09/2020  Glucose 70 - 99 mg/dL 99 106(H) 111(H)  BUN 8 - 23 mg/dL 18 21 23   Creatinine 0.61 - 1.24 mg/dL 1.26(H) 1.25(H) 1.07  Sodium 135 - 145 mmol/L 138 136 137  Potassium 3.5 - 5.1 mmol/L 4.7 4.6 4.6  Chloride 98 - 111 mmol/L 104 103 105  CO2 22 - 32 mmol/L 26 24 25   Calcium 8.9 - 10.3 mg/dL 9.3 9.2 9.6  Total Protein 6.5 - 8.1 g/dL 7.2 7.2 7.1  Total Bilirubin 0.3 - 1.2 mg/dL 0.5 0.5 0.2(L)  Alkaline Phos 38 - 126 U/L 46 47 43  AST 15 - 41 U/L 25 25 31   ALT 0 - 44 U/L 26 28 31    CBC Latest Ref Rng & Units 02/11/2020 02/10/2020 02/09/2020  WBC 4.0  - 10.5 K/uL 10.0 10.1 7.6  Hemoglobin 13.0 - 17.0 g/dL 11.2(L) 11.5(L) 11.6(L)  Hematocrit 39 - 52 % 35.0(L) 35.7(L) 35.9(L)  Platelets 150 - 400 K/uL 373 335 341   Imaging: DG Chest Portable 1 View  Result Date: 02/10/2020 CLINICAL DATA:  Fevers EXAM: PORTABLE CHEST 1 VIEW COMPARISON:  04/25/2017 FINDINGS: Cardiac shadow is within normal limits. The lungs are well aerated bilaterally. No focal infiltrate or effusion is seen. No bony abnormality is noted. IMPRESSION: No acute abnormality noted. Electronically Signed   By: Inez Catalina M.D.   On: 02/10/2020 19:42   Assessment/Plan:  Principal Problem:   Alcohol withdrawal ([place]) Active Problems:   Essential hypertension   Lactic acidosis   Alcohol use disorder, severe, in early remission ([place])   Malnutrition of moderate degree   Rhabdomyolysis  Dan Henry is a 69 yo male with PMH of alcohol use disorder, CAD, HTN, alcoholic hepatitis, iron deficiency anemia, and benign essential tremors who presented to the ED symptoms consistent with ETOH withdraw and admitted for lactic acidosis and alcohol withdrawal.  #Fever, resolved Patient had temperature to 100.4 yesterday afternoon despite patient being asymptomatic. CBC, CMP, blood cultures, urinalysis and CXR obtained. White blood cell count uptrending over past few days to 10.0 most recently. Urinalysis  unremarkable. CXR shows no acute abnormality. Blood cultures pending. His one-time elevated temperature to 100.4 may be secondary to atelectasis. He would benefit from incentive spirometry given his significantly depressed level of activity over the past two weeks. -Monitor temperature closely -Incentive spirometry Q4H -Follow-up blood cultures  #AKI, active Creatinine 1.26 up from 1.07. Likely pre-renal in etiology. -Giving LR 100 ml/h x 12H  #Generalized weakness, active #Functional incontinence, active Following resolution of patient's alcohol withdrawal and rhabdomyolysis,  patient continued to have significant weakness and gait impairment complicated by functional incontinence. He has been evaluated by PT and OT. He requires moderate to maximum assistance for mobility tasks. PT and OT have recommended SNF vs. supervision/assistance 24 hours. As patient lives alone and has most of his support from his niece, he would benefit from SNF placement. Barriers to placement include his history of alcohol use and behavior in prior admissions. He remains very patient and cooperative while waiting for bed availability. - Up in chair with assistance  - Ensure TID - Continue to monitor daily while awaiting SNF placement, pending bed offers - Requires rolling walker with 5" wheels; 3 in 1 - current recommendations at DC  #High risk alcohol withdrawal, resolved Off CIWA with ativan and librium. He denies any symptoms of withdrawal. AST and ALT within normal limits. -Thiamine, folic acid, multivitamin supplementation -Plan to discharge with naltrexone   #HTN, chronic Normotensive on current regimen. -Continue home medication regimen (metoprolol 25mg  twice daily, lisinopril 10mg  daily, amlodipine 10mg  daily)  #Microcytic anemia, chronic Hgb stable throughout hospitalization on iron supplementation. -Continue iron supplementation   #BPH, chronic  -Continue Flomax 0.4 mg daily  #CAD, chronic Patient with a history of angina on nitroglycerin as needed for chest pain at home. No chest pain throughout hospitalization. --Nitroglycerin 0.4 mg sublingual q54min prn for chest pain  Code status: Full Code IVF: None Diet: Heart healthy VTE ppx: Lovenox  Disposition: Pending SNF placement  Cato Mulligan, MD 02/11/2020, 2:47 PM Pager: [phone] After 5pm on weekdays and 1pm on weekends: On Call pager [phone]

## 2020-02-11 NOTE — Progress Notes (Signed)
Physical Therapy Treatment Patient Details Name: Dan Henry MRN: [id number] DOB: [birth year] Today's Date: 02/11/2020    History of Present Illness Pt is a 69 y/o male admitted secondary to alcohol withdrawal. Pt also with alcoholic hepatitis. PMH includes alcohol abuse, HTN, CAD.    PT Comments    Pt seated in recliner on arrival and motivated to participate in PT session.  Pt tolerated increased gt distance but remains to require min assistance for gt training.  Focused on LE strengthening after gt training.  Pt continues to benefit from skilled rehab placement to improve strength and function before returning home to private residence.     Follow Up Recommendations  SNF;Supervision/Assistance - 24 hour     Equipment Recommendations  Rolling walker with 5" wheels;3in1 (PT)    Recommendations for Other Services       Precautions / Restrictions Precautions Precautions: Fall Restrictions Weight Bearing Restrictions: No Other Position/Activity Restrictions: patient had c/o pain to L forefoot.  Appears swollen and tender to the touch.    Mobility  Bed Mobility               General bed mobility comments: Seated in recliner on arrival.  Transfers Overall transfer level: Needs assistance Equipment used: Rolling walker (2 wheeled) Transfers: Sit to/from Stand Sit to Stand: Min guard         General transfer comment: Cues for hand placement forward weight shifting .  Pt also required cues for reaching back to seated surface.  Ambulation/Gait Ambulation/Gait assistance: Min assist Gait Distance (Feet): 220 Feet Assistive device: Rolling walker (2 wheeled) Gait Pattern/deviations: Step-through pattern;Decreased stride length;Trunk flexed;Narrow base of support;Decreased stance time - left;Decreased weight shift to left     General Gait Details: Cues for sequencing and RW safety.  Pt required assistance to weight shift L.  Pt with poor obstacle negotiation running  into obstacles on the L side.  Pt list hard to Th R due to L foot pain.   Stairs             Wheelchair Mobility    Modified Rankin (Stroke Patients Only)       Balance Overall balance assessment: Needs assistance Sitting-balance support: Feet supported;Single extremity supported Sitting balance-Leahy Scale: Good Sitting balance - Comments: sits unsupported     Standing balance-Leahy Scale: Poor Standing balance comment: R lateral lean, min assistance for weight shifting and to maintain balance.                            Cognition Arousal/Alertness: Awake/alert Behavior During Therapy: WFL for tasks assessed/performed Overall Cognitive Status: Within Functional Limits for tasks assessed (cognition not formally assessed but following commands well during session.)                                        Exercises General Exercises - Lower Extremity Ankle Circles/Pumps: AROM;Both;10 reps;Supine Quad Sets: AROM;Both;10 reps;Supine Long Arc Quad: AROM;Both;10 reps;Seated Heel Slides: AROM;Both;10 reps;Supine Straight Leg Raises: AROM;Both;10 reps;Supine Hip Flexion/Marching: AROM;Both;10 reps;Seated    General Comments        Pertinent Vitals/Pain Pain Assessment: 0-10 Pain Location: L foot, lists to the R to offload Pain Descriptors / Indicators: Guarding;Grimacing;Stabbing Pain Intervention(s): Monitored during session;Repositioned    Home Living  Prior Function            PT Goals (current goals can now be found in the care plan section) Acute Rehab PT Goals Patient Stated Goal: keeping getting better. PT Goal Formulation: Patient unable to participate in goal setting Potential to Achieve Goals: Fair Progress towards PT goals: Progressing toward goals    Frequency    Min 2X/week      PT Plan Current plan remains appropriate    Co-evaluation              AM-PAC PT "6 Clicks"  Mobility   Outcome Measure  Help needed turning from your back to your side while in a flat bed without using bedrails?: A Little Help needed moving from lying on your back to sitting on the side of a flat bed without using bedrails?: A Little Help needed moving to and from a bed to a chair (including a wheelchair)?: A Little Help needed standing up from a chair using your arms (e.g., wheelchair or bedside chair)?: A Little Help needed to walk in hospital room?: A Lot Help needed climbing 3-5 steps with a railing? : A Lot 6 Click Score: 16    End of Session Equipment Utilized During Treatment: Gait belt Activity Tolerance: Patient tolerated treatment well Patient left: in chair;with call bell/phone within reach;with chair alarm set Nurse Communication: Mobility status PT Visit Diagnosis: Unsteadiness on feet (R26.81);Muscle weakness (generalized) (M62.81);Difficulty in walking, not elsewhere classified (R26.2)     Time: 1916-6060 PT Time Calculation (min) (ACUTE ONLY): 19 min  Charges:  $Gait Training: 8-22 mins                     Dan Henry , PTA Acute Rehabilitation Services Pager [phone] Office [phone]     Dan Henry 02/11/2020, 1:26 PM

## 2020-02-12 DIAGNOSIS — R2689 Other abnormalities of gait and mobility: Secondary | ICD-10-CM | POA: Diagnosis not present

## 2020-02-12 DIAGNOSIS — R531 Weakness: Secondary | ICD-10-CM | POA: Diagnosis not present

## 2020-02-12 DIAGNOSIS — F10239 Alcohol dependence with withdrawal, unspecified: Secondary | ICD-10-CM | POA: Diagnosis not present

## 2020-02-12 DIAGNOSIS — K7010 Alcoholic hepatitis without ascites: Secondary | ICD-10-CM | POA: Diagnosis not present

## 2020-02-12 LAB — CBC WITH DIFFERENTIAL/PLATELET
Abs Immature Granulocytes: 0.04 10*3/uL (ref 0.00–0.07)
Basophils Absolute: 0.1 10*3/uL (ref 0.0–0.1)
Basophils Relative: 1 %
Eosinophils Absolute: 0.2 10*3/uL (ref 0.0–0.5)
Eosinophils Relative: 3 %
HCT: 35.5 % — ABNORMAL LOW (ref 39.0–52.0)
Hemoglobin: 11.3 g/dL — ABNORMAL LOW (ref 13.0–17.0)
Immature Granulocytes: 1 %
Lymphocytes Relative: 14 %
Lymphs Abs: 1.2 10*3/uL (ref 0.7–4.0)
MCH: 27.2 pg (ref 26.0–34.0)
MCHC: 31.8 g/dL (ref 30.0–36.0)
MCV: 85.5 fL (ref 80.0–100.0)
Monocytes Absolute: 0.8 10*3/uL (ref 0.1–1.0)
Monocytes Relative: 10 %
Neutro Abs: 6.1 10*3/uL (ref 1.7–7.7)
Neutrophils Relative %: 71 %
Platelets: 403 10*3/uL — ABNORMAL HIGH (ref 150–400)
RBC: 4.15 MIL/uL — ABNORMAL LOW (ref 4.22–5.81)
RDW: 19.5 % — ABNORMAL HIGH (ref 11.5–15.5)
WBC: 8.5 10*3/uL (ref 4.0–10.5)
nRBC: 0 % (ref 0.0–0.2)

## 2020-02-12 LAB — COMPREHENSIVE METABOLIC PANEL
ALT: 27 U/L (ref 0–44)
AST: 25 U/L (ref 15–41)
Albumin: 3.2 g/dL — ABNORMAL LOW (ref 3.5–5.0)
Alkaline Phosphatase: 44 U/L (ref 38–126)
Anion gap: 11 (ref 5–15)
BUN: 15 mg/dL (ref 8–23)
CO2: 24 mmol/L (ref 22–32)
Calcium: 9.4 mg/dL (ref 8.9–10.3)
Chloride: 105 mmol/L (ref 98–111)
Creatinine, Ser: 1.02 mg/dL (ref 0.61–1.24)
GFR, Estimated: >60 mL/min (ref >60)
Glucose, Bld: 102 mg/dL — ABNORMAL HIGH (ref 70–99)
Potassium: 4.3 mmol/L (ref 3.5–5.1)
Sodium: 140 mmol/L (ref 135–145)
Total Bilirubin: 0.7 mg/dL (ref 0.3–1.2)
Total Protein: 6.8 g/dL (ref 6.5–8.1)

## 2020-02-12 NOTE — Progress Notes (Signed)
Occupational Therapy Treatment Patient Details Name: Dan Henry MRN: [id number] DOB: February 27, 1951 Today's Date: 02/12/2020    History of present illness Pt is a 69 y/o male admitted secondary to alcohol withdrawal. Pt also with alcoholic hepatitis. PMH includes alcohol abuse, HTN, CAD.   OT comments  Patient continues to express L foot pain with weight bearing.  This pain to his L foot impacts balance, which in turn decreases his independence with ADL and functional mobility.  OT to send MD a message to see if this could be looked at.  He is performing his own ADL sink side, is able to walk to the bathroom with a RW, but due to the foot pain, he needs supervision and min guard for balance deficits.  OT to continue to follow in the acute setting, but if the pain in his foot improves, home with [facility] OT/PT could be a possibility.    Follow Up Recommendations  SNF    Equipment Recommendations  Tub/shower seat    Recommendations for Other Services      Precautions / Restrictions Precautions Precautions: Fall Restrictions Weight Bearing Restrictions: No Other Position/Activity Restrictions: patient continues with c/o pain to L forefoot.  Appears swollen and tender to the touch.       Mobility Bed Mobility   Bed Mobility: Supine to Sit;Sit to Supine     Supine to sit: Modified independent (Device/Increase time) Sit to supine: Modified independent (Device/Increase time)      Transfers   Equipment used: Rolling walker (2 wheeled) Transfers: Sit to/from Stand Sit to Stand: Min guard Stand pivot transfers: Supervision            Balance Overall balance assessment: Needs assistance Sitting-balance support: No upper extremity supported Sitting balance-Leahy Scale: Good     Standing balance support: Bilateral upper extremity supported Standing balance-Leahy Scale: Fair Standing balance comment: continues to try and off load off L foot.                            ADL either performed or assessed with clinical judgement   ADL       Grooming: Supervision/safety;Min guard;Standing Grooming Details (indicate cue type and reason): 1 LOB to the right.                 Toilet Transfer: Supervision/safety;RW;Ambulation   Writer and Hygiene: Independent;Sitting/lateral lean       Functional mobility during ADLs: Min guard                          Frequency  Min 1X/week        Progress Toward Goals  OT Goals(current goals can now be found in the care plan section)  Progress towards OT goals: Progressing toward goals  Acute Rehab OT Goals Patient Stated Goal: I'd like to go home, but I don't have help OT Goal Formulation: With patient Time For Goal Achievement: 02/16/20 Potential to Achieve Goals: Good  Plan Discharge plan remains appropriate    Co-evaluation                 AM-PAC OT "6 Clicks" Daily Activity     Outcome Measure   Help from another person eating meals?: None Help from another person taking care of personal grooming?: A Little Help from another person toileting, which includes using toliet, bedpan, or urinal?: A Little Help from another person bathing (including washing,  rinsing, drying)?: A Little Help from another person to put on and taking off regular upper body clothing?: A Little Help from another person to put on and taking off regular lower body clothing?: A Little 6 Click Score: 19    End of Session Equipment Utilized During Treatment: Gait belt  OT Visit Diagnosis: Other abnormalities of gait and mobility (R26.89);Muscle weakness (generalized) (M62.81);History of falling (Z91.81);Other symptoms and signs involving cognitive function   Activity Tolerance Patient tolerated treatment well   Patient Left in bed;with call bell/phone within reach;with bed alarm set   Nurse Communication          Time: 1045-1101 OT Time Calculation (min): 16  min  Charges: OT General Charges $OT Visit: 1 Visit OT Treatments $Self Care/Home Management : 8-22 mins  02/12/2020  Rich, OTR/L  Acute Rehabilitation Services  Office:  [place] 02/12/2020, 11:54 AM

## 2020-02-12 NOTE — Progress Notes (Signed)
Subjective: Dan Henry is a 69 year old man with past medical history of alcohol use disorder, CAD,  HTN, alcoholic hepatitis, iron deficiency anemia, and benign essential tremors who presented to the ED on 01/28/20 found to have high-risk alcohol withdrawal.  Overnight, no acute events.  This morning, he states that he is doing well. He was able to work with physical therapy yesterday without any difficulties. Currently, he denies chest pain, shortness of breath but states that he continues to experience foot pain. He drinks the ensures daily as well.   Objective:  Vital signs in last 24 hours: Vitals:   02/12/20 0821 02/12/20 1103  BP: 124/71 122/69  Pulse:    Resp:  18  Temp:  98 F (36.7 C)  SpO2:  97%   SpO2: 97 % O2 Flow Rate (L/min): 0 L/min  Filed Weights   02/09/20 0632 02/10/20 0636 02/12/20 0503  Weight: 56.3 kg 56.5 kg 56.2 kg    Intake/Output Summary (Last 24 hours) at 02/12/2020 1130 Last data filed at 02/12/2020 1105 Gross per 24 hour  Intake 650.63 ml  Output 700 ml  Net -49.37 ml   General: NAD, nl appearance Cardiovascular: Normal heart rate, regular rhythm. No murmurs or rubs Pulmonary : Effort normal, breath sounds  Abdominal: soft, nontender, bowel sounds present Skin: Area of hyperpigmentation and scale on ball of left foot. Minimally tender to palpation.  Labs:   CMP Latest Ref Rng & Units 02/12/2020 02/11/2020 02/10/2020  Glucose 70 - 99 mg/dL 102(H) 99 106(H)  BUN 8 - 23 mg/dL 15 18 21   Creatinine 0.61 - 1.24 mg/dL 1.02 1.26(H) 1.25(H)  Sodium 135 - 145 mmol/L 140 138 136  Potassium 3.5 - 5.1 mmol/L 4.3 4.7 4.6  Chloride 98 - 111 mmol/L 105 104 103  CO2 22 - 32 mmol/L 24 26 24   Calcium 8.9 - 10.3 mg/dL 9.4 9.3 9.2  Total Protein 6.5 - 8.1 g/dL 6.8 7.2 7.2  Total Bilirubin 0.3 - 1.2 mg/dL 0.7 0.5 0.5  Alkaline Phos 38 - 126 U/L 44 46 47  AST 15 - 41 U/L 25 25 25   ALT 0 - 44 U/L 27 26 28    CBC Latest Ref Rng & Units 02/12/2020  02/11/2020 02/10/2020  WBC 4.0 - 10.5 K/uL 8.5 10.0 10.1  Hemoglobin 13.0 - 17.0 g/dL 11.3(L) 11.2(L) 11.5(L)  Hematocrit 39 - 52 % 35.5(L) 35.0(L) 35.7(L)  Platelets 150 - 400 K/uL 403(H) 373 335   Blood cultures x2 10/11: NGTD  Imaging: No results found.   Assessment/Plan:  Principal Problem:   Alcohol withdrawal ([place]) Active Problems:   Essential hypertension   Lactic acidosis   Alcohol use disorder, severe, in early remission ([facility])   Malnutrition of moderate degree   Rhabdomyolysis  Dan Henry is a 69 yo male with PMH of alcohol use disorder, CAD, HTN, alcoholic hepatitis, iron deficiency anemia, and benign essential tremors who presented to the ED symptoms consistent with ETOH withdraw and admitted for lactic acidosis and alcohol withdrawal.  #Generalized weakness, improving Following resolution of patient's alcohol withdrawal and rhabdomyolysis, patient continued to have significant weakness and gait impairment initially complicated by functional incontinence. He has been evaluated by PT and OT. He requires moderate to maximum assistance for mobility tasks. PT and OT have recommended SNF vs. supervision/assistance 24 hours. As patient lives alone and has most of his support from his niece, he would benefit from SNF placement. Barriers to placement include his history of alcohol use and behavior  in prior admissions. He remains very patient and cooperative while waiting for bed availability. - Up in chair with assistance  - Ensure TID - Continue to monitor daily while awaiting SNF placement, pending bed offers - Requires rolling walker with 5" wheels; 3 in 1 - current recommendations at DC  #Foot pain, left Patient reports mild pain in sole of left foot pain throughout hospitalization. Radiograph of left foot revealed no acute fractures or dislocations. Patient able to ambulate without pain to foot. Physical examination unremarkable aside from area of hyperpigmentation and  scale of ball of left foot. -Apply dressing to area -Continue to monitor  #Borderline fever, resolved Patient had temperature to 100.4 on 10/11 afternoon despite patient being asymptomatic. CBC, CMP, blood cultures, urinalysis and CXR obtained. White blood cell count downtrending to 8.5 from 10.0. Urinalysis unremarkable. CXR shows no acute abnormality. Blood cultures reveal no growth to date. His one-time elevated temperature to 100.4 may be secondary to atelectasis. He would benefit from incentive spirometry given his significantly depressed level of activity over the past two weeks. -Monitor temperature closely -Incentive spirometry Q4H -Continue to follow blood cultures  #High risk alcohol withdrawal, resolved Off CIWA with ativan and librium. He denies any symptoms of withdrawal. AST and ALT within normal limits. -Thiamine, folic acid, multivitamin supplementation -Plan to discharge with naltrexone   #HTN, chronic Normotensive on current regimen. -Continue home medication regimen (metoprolol 25mg  twice daily, lisinopril 10mg  daily, amlodipine 10mg  daily)  #Microcytic anemia, chronic Hgb stable throughout hospitalization on iron supplementation. -Continue iron supplementation   #BPH, chronic  -Continue Flomax 0.4 mg daily  #CAD, chronic Patient with a history of angina on nitroglycerin as needed for chest pain at home. No chest pain throughout hospitalization. --Nitroglycerin 0.4 mg sublingual q65min prn for chest pain  Code status: Full Code IVF: None Diet: Heart healthy VTE ppx: Lovenox  Disposition: Pending SNF placement  Cato Mulligan, MD 02/12/2020, 11:30 AM Pager: [phone] After 5pm on weekdays and 1pm on weekends: On Call pager [phone]

## 2020-02-12 NOTE — TOC Progression Note (Signed)
Transition of Care [facility]) - Progression Note    Patient Details  Name: Dan Henry MRN: [id number] Date of Birth: Jul 16, 1950  Transition of Care [facility]) CM/SW [place], [place] Phone Number: 02/12/2020, 3:17 PM  Clinical Narrative:     CSW visit with the patient at bedside. CSW informed of no bed offers and barrier to placement. CSW explained he is slowly progressing and walking over 220 feet. Patient states he understands. He is agreeable and is comfortable with discharging home. Patient states his friend/neighbor Lonna Cobb is agreeable to assist as needed.   CSW spoke with Ms. Brewer [phone], she confirmed she is the patient's neighbor and is willing to assist the patient. She confirmed she will pick up the patient when is ready for discharge.   CSW updated RN and MD Wynetta Emery- patient will discharge  Home. TOC will continue to follow and assist with discharge planning.  Thurmond Butts, MSW, [facility] Clinical Social Worker   Expected Discharge Plan: Skilled Nursing Facility Barriers to Discharge: Ship broker, SNF Pending bed offer, Continued Medical Work up  Expected Discharge Plan and Services Expected Discharge Plan: [place] arrangements for the past 2 months: Single Family Home Expected Discharge Date: 01/31/20                                     Social Determinants of Health (SDOH) Interventions    Readmission Risk Interventions No flowsheet data found.

## 2020-02-12 NOTE — Plan of Care (Signed)
  Problem: Activity: Goal: Risk for activity intolerance will decrease Outcome: Progressing   

## 2020-02-13 ENCOUNTER — Other Ambulatory Visit (HOSPITAL_COMMUNITY): Payer: Self-pay | Admitting: Student

## 2020-02-13 DIAGNOSIS — R531 Weakness: Secondary | ICD-10-CM | POA: Diagnosis not present

## 2020-02-13 DIAGNOSIS — R2689 Other abnormalities of gait and mobility: Secondary | ICD-10-CM | POA: Diagnosis not present

## 2020-02-13 DIAGNOSIS — F10239 Alcohol dependence with withdrawal, unspecified: Secondary | ICD-10-CM | POA: Diagnosis not present

## 2020-02-13 DIAGNOSIS — K7010 Alcoholic hepatitis without ascites: Secondary | ICD-10-CM | POA: Diagnosis not present

## 2020-02-13 LAB — CBC WITH DIFFERENTIAL/PLATELET
Abs Immature Granulocytes: 0.04 10*3/uL (ref 0.00–0.07)
Basophils Absolute: 0.1 10*3/uL (ref 0.0–0.1)
Basophils Relative: 2 %
Eosinophils Absolute: 0.2 10*3/uL (ref 0.0–0.5)
Eosinophils Relative: 3 %
HCT: 34.9 % — ABNORMAL LOW (ref 39.0–52.0)
Hemoglobin: 11.4 g/dL — ABNORMAL LOW (ref 13.0–17.0)
Immature Granulocytes: 1 %
Lymphocytes Relative: 15 %
Lymphs Abs: 1.2 10*3/uL (ref 0.7–4.0)
MCH: 28 pg (ref 26.0–34.0)
MCHC: 32.7 g/dL (ref 30.0–36.0)
MCV: 85.7 fL (ref 80.0–100.0)
Monocytes Absolute: 0.7 10*3/uL (ref 0.1–1.0)
Monocytes Relative: 8 %
Neutro Abs: 6 10*3/uL (ref 1.7–7.7)
Neutrophils Relative %: 71 %
Platelets: 477 10*3/uL — ABNORMAL HIGH (ref 150–400)
RBC: 4.07 MIL/uL — ABNORMAL LOW (ref 4.22–5.81)
RDW: 19.2 % — ABNORMAL HIGH (ref 11.5–15.5)
WBC: 8.3 10*3/uL (ref 4.0–10.5)
nRBC: 0 % (ref 0.0–0.2)

## 2020-02-13 LAB — COMPREHENSIVE METABOLIC PANEL
ALT: 27 U/L (ref 0–44)
AST: 25 U/L (ref 15–41)
Albumin: 3.2 g/dL — ABNORMAL LOW (ref 3.5–5.0)
Alkaline Phosphatase: 55 U/L (ref 38–126)
Anion gap: 10 (ref 5–15)
BUN: 16 mg/dL (ref 8–23)
CO2: 24 mmol/L (ref 22–32)
Calcium: 9.7 mg/dL (ref 8.9–10.3)
Chloride: 103 mmol/L (ref 98–111)
Creatinine, Ser: 1 mg/dL (ref 0.61–1.24)
GFR, Estimated: >60 mL/min (ref >60)
Glucose, Bld: 105 mg/dL — ABNORMAL HIGH (ref 70–99)
Potassium: 4.2 mmol/L (ref 3.5–5.1)
Sodium: 137 mmol/L (ref 135–145)
Total Bilirubin: 0.5 mg/dL (ref 0.3–1.2)
Total Protein: 7.6 g/dL (ref 6.5–8.1)

## 2020-02-13 MED ORDER — ENSURE ENLIVE PO LIQD: 237.0000 mL | Freq: Three times a day (TID) | ORAL | 12 refills | Status: DC

## 2020-02-13 MED ORDER — NALTREXONE HCL 50 MG PO TABS: 50.0000 mg | ORAL_TABLET | Freq: Every day | ORAL | 0 refills | Status: DC

## 2020-02-13 MED FILL — NALTREXONE 50 MG TABLET: 50 | 30 days supply | Qty: 30 | Fill #0

## 2020-02-13 NOTE — Progress Notes (Signed)
Subjective: [name] is a 69 year old man with past medical history of alcohol use disorder, CAD,  HTN, alcoholic hepatitis, iron deficiency anemia, and benign essential tremors who presented to the ED on 01/28/20 found to have high-risk alcohol withdrawal.  Overnight, no acute events.  This morning, patient says he feels good. He is excited about going home. He would like to start naltrexone to help him with craving for alcohol. Denies any cravings today. He will be scheduled for follow up with [facility]   Objective:  Vital signs in last 24 hours: Vitals:   02/13/20 0019 02/13/20 0308  BP: 120/75 131/79  Pulse: 78 69  Resp: 16 16  Temp: 99.3 F (37.4 C) 98.6 F (37 C)  SpO2: 100% 100%   SpO2: 100 % O2 Flow Rate (L/min): 0 L/min  Filed Weights   02/10/20 0636 02/12/20 0503 02/13/20 0308  Weight: 56.5 kg 56.2 kg 55.4 kg    Intake/Output Summary (Last 24 hours) at 02/13/2020 0615 Last data filed at 02/13/2020 0300 Gross per 24 hour  Intake 240 ml  Output 900 ml  Net -660 ml   General: NAD, nl appearance Cardiovascular: Normal heart rate, regular rhythm. No murmurs or rubs Pulmonary : Effort normal, breath sounds  Abdominal: soft, nontender, bowel sounds present Skin: Hyperpigmented area minimally tender to palpation on the sole of the left foot.  Labs:   CMP Latest Ref Rng & Units 02/13/2020 02/12/2020 02/11/2020  Glucose 70 - 99 mg/dL 105(H) 102(H) 99  BUN 8 - 23 mg/dL 16 15 18   Creatinine 0.61 - 1.24 mg/dL 1.00 1.02 1.26(H)  Sodium 135 - 145 mmol/L 137 140 138  Potassium 3.5 - 5.1 mmol/L 4.2 4.3 4.7  Chloride 98 - 111 mmol/L 103 105 104  CO2 22 - 32 mmol/L 24 24 26   Calcium 8.9 - 10.3 mg/dL 9.7 9.4 9.3  Total Protein 6.5 - 8.1 g/dL 7.6 6.8 7.2  Total Bilirubin 0.3 - 1.2 mg/dL 0.5 0.7 0.5  Alkaline Phos 38 - 126 U/L 55 44 46  AST 15 - 41 U/L 25 25 25   ALT 0 - 44 U/L 27 27 26    CBC Latest Ref Rng & Units 02/13/2020 02/12/2020 02/11/2020  WBC 4.0 - 10.5  K/uL 8.3 8.5 10.0  Hemoglobin 13.0 - 17.0 g/dL 11.4(L) 11.3(L) 11.2(L)  Hematocrit 39 - 52 % 34.9(L) 35.5(L) 35.0(L)  Platelets 150 - 400 K/uL 477(H) 403(H) 373   Blood cultures x2 10/11: No growth x2 days  Imaging: No results found.   Assessment/Plan:  Principal Problem:   Alcohol withdrawal ([place]) Active Problems:   Essential hypertension   Lactic acidosis   Alcohol use disorder, severe, in early remission (HCC)   Malnutrition of moderate degree   Rhabdomyolysis  [name] with PMH of alcohol use disorder, CAD, HTN, alcoholic hepatitis, iron deficiency anemia, and benign essential tremors who presented to the ED symptoms consistent with ETOH withdraw and admitted for lactic acidosis and alcohol withdrawal.  #Generalized weakness, improving Following resolution of patient's alcohol withdrawal and rhabdomyolysis, patient continued to have significant weakness and gait impairment initially complicated by functional incontinence. PT and OT initially recommending placement in SNF, however due to his history of ethanol use and behavior during prior admissions, he received no bed offers for over a week. He continued with therapy during hospitalization and offered the option to go home with home health PT and OT. -Up in chair with assistance  -Ensure TID -Requires  rolling walker with 5" wheels; 3 in 1 - current recommendations at [facility] PT and OT  #Foot pain, left Patient reports mild pain in sole of left foot pain throughout hospitalization. Radiograph of left foot revealed no acute fractures or dislocations. Patient able to ambulate without pain to foot. Physical examination unremarkable aside from area of hyperpigmentation on ball of left foot. -Apply dressing to area -Continue to monitor -Follow-up clinic appointment to re-evaluate and refer to podiatry if indicated  #High risk alcohol withdrawal, resolved Off CIWA with ativan and librium. He denies  any symptoms of withdrawal. AST and ALT within normal limits. -Thiamine, folic acid, multivitamin supplementation -Discharge with naltrexone   #HTN, chronic Normotensive on current regimen. -Continue home medication regimen (metoprolol 25mg  twice daily, lisinopril 10mg  daily, amlodipine 10mg  daily)  #Microcytic anemia, chronic Hgb stable throughout hospitalization on iron supplementation. -Continue iron supplementation   #BPH, chronic  -Continue Flomax 0.4 mg daily  #CAD, chronic Patient with a history of angina on nitroglycerin as needed for chest pain at home. No chest pain throughout hospitalization. --Nitroglycerin 0.4 mg sublingual q19min prn for chest pain  Code status: Full Code IVF: None Diet: Heart healthy VTE ppx: Lovenox  Disposition: Pending SNF placement  Dan Mulligan, MD 02/13/2020, 6:15 AM Pager: [phone] After 5pm on weekdays and 1pm on weekends: On Call pager [phone]

## 2020-02-13 NOTE — TOC Transition Note (Signed)
Transition of Care [facility]) - CM/SW Discharge Note   Patient Details  Name: Worley Radermacher MRN: [id number] Date of Birth: [birth year]  [facility]) CM/SW Contact:  Bethena Roys, RN Phone Number: 02/13/2020, 3:17 PM   Clinical Narrative: Late Entry: Plan was initially for SNF and patient wants to return home. Patient states he has family support. Case Manager spoke with patient regarding plan of care. Patient wanted to used [facility] and they were unable to accept the patient. Case Manager then called the patient back to see if [facility] would be okay to use for services- patient was agreeable and referral was made. Start of care to begin within 24-48 hours post transition home. Durable Medical equipment ordered via [place] and 3n1- delivered to the room. Patient stated that he had transportation home.   Final next level of care: [place] Barriers to Discharge: No Barriers Identified   Patient Goals and CMS Choice Patient states their goals for this hospitalization and ongoing recovery are:: to return home CMS Medicare.gov Compare Post Acute Care list provided to:: Patient Choice offered to / list presented to : Patient  Discharge Placement                       Discharge Plan and Services In-house Referral: NA Discharge Planning Services: CM Consult Post Acute Care Choice: Home Health, Durable Medical Equipment          DME Arranged: Walker rolling, 3-N-1 DME Agency: [employer] Date DME Agency Contacted: 02/13/20 Time DME Agency Contacted: 15 Representative spoke with at DME Agency: [facility] Arranged: PT, OT [place] Agency: [place] Date [place]: 02/13/20 Time [place]: 1035 Representative spoke with at [place]: [place]) Interventions     Readmission Risk Interventions No flowsheet data found.

## 2020-02-13 NOTE — Discharge Instructions (Signed)
[name],  It was a pleasure meeting you during your recent hospital admission. You were admitted to the hospital due to severe alcohol withdrawal and required treatment for this condition. Given the significant improvement of your condition, you are safe to discharge home with home health physical and occupational therapy. As discussed, we would like you to begin taking naltrexone 50mg  daily, which is a medication that you were previously prescribed, in order to reduce cravings and the likelihood of you having recurrence of alcohol use and withdrawal. You will need to follow up with the [facility] following discharge to evaluate your progress following your hospitalization.  Sincerely, Dr. Paulla Dolly, MD    Alcohol Withdrawal Syndrome When a person who drinks a lot of alcohol stops drinking, he or she may have unpleasant and serious symptoms. These symptoms are called alcohol withdrawal syndrome. This condition may be mild or severe. It can be life-threatening. It can cause:  Shaking that you cannot control (tremor).  Sweating.  Headache.  Feeling fearful, upset, grouchy, or depressed.  Trouble sleeping (insomnia).  Nightmares.  Fast or uneven heartbeats (palpitations).  Alcohol cravings.  Feeling sick to your stomach (nausea).  Throwing up (vomiting).  Being bothered by light and sounds.  Confusion.  Trouble thinking clearly.  Not being hungry (loss of appetite).  Big changes in mood (mood swings). If you have all of the following symptoms at the same time, get help right away:  High blood pressure.  Fast heartbeat.  Trouble breathing.  Seizures.  Seeing, hearing, feeling, smelling, or tasting things that are not there (hallucinations). These symptoms are known as delirium tremens (DTs). They must be treated at the hospital right away. Follow these instructions at home:   Take over-the-counter and prescription medicines only as told  by your doctor. This includes vitamins.  Do not drink alcohol.  Do not drive until your doctor says that this is safe for you.  Have someone stay with you or be available in case you need help. This should be someone you trust. This person can help you with your symptoms. He or she can also help you to not drink.  Drink enough fluid to keep your pee (urine) pale yellow.  Think about joining a support group or a treatment program to help you stop drinking.  Keep all follow-up visits as told by your doctor. This is important. Contact a doctor if:  Your symptoms get worse.  You cannot eat or drink without throwing up.  You have a hard time not drinking alcohol.  You cannot stop drinking alcohol. Get help right away if:  You have fast or uneven heartbeats.  You have chest pain.  You have trouble breathing.  You have a seizure for the first time.  You see, hear, feel, smell, or taste something that is not there.  You get very confused. Summary  When a person who drinks a lot of alcohol stops drinking, he or she may have serious symptoms. This is called alcohol withdrawal syndrome.  Delirium tremens (DTs) is a group of life-threatening symptoms. You should get help right away if you have these symptoms.  Think about joining an alcohol support group or a treatment program. This information is not intended to replace advice given to you by your health care provider. Make sure you discuss any questions you have with your health care provider. Document Revised: 03/31/2017 Document Reviewed: 12/23/2016 Elsevier Patient Education  [place].

## 2020-02-13 NOTE — Progress Notes (Signed)
D/C instructions given to patient. Medications reviewed. All questions answered. IV removed, clean and intact. BSC and walker delivered to room. Niece Selinda Eon to escort pt home.  Clyde Canterbury, RN

## 2020-02-13 NOTE — Progress Notes (Signed)
Physical Therapy Treatment Patient Details Name: Dan Henry MRN: [id number] DOB: Sep 19, 1950 Today's Date: 02/13/2020    History of Present Illness Pt is a 69 y/o male admitted secondary to alcohol withdrawal. Pt also with alcoholic hepatitis. PMH includes alcohol abuse, HTN, CAD.    PT Comments    Pt supine in bed on arrival.  Pt continues to be painful in L forefoot.  He would greatly benefit from podiatry referral to address L foot callus on forefoot distal to great toe.  PTA added ABD pads around sore callus to offload lesion and balance much improved.  Pt to d/c home with support from his family and will be issued DME listed below.  Based on progress will update recommendations to HHPT at this time. Will inform supervising PT of need for change in recommendations at this time.    Follow Up Recommendations  Home health PT;Supervision for mobility/OOB     Equipment Recommendations  Rolling walker with 5" wheels;3in1 (PT)    Recommendations for Other Services       Precautions / Restrictions Precautions Precautions: Fall Restrictions Weight Bearing Restrictions: No Other Position/Activity Restrictions: patient continues with c/o pain to L forefoot.  Appears swollen and tender to the touch.  Area is convered by a large callus and would benefit from a podiatry consult to manage the callus and reduce the pain.    Mobility  Bed Mobility Overal bed mobility: Needs Assistance Bed Mobility: Supine to Sit     Supine to sit: Modified independent (Device/Increase time)        Transfers Overall transfer level: Needs assistance Equipment used: Rolling walker (2 wheeled)   Sit to Stand: Supervision         General transfer comment: Cues for hand placement forward weight shifting .  Pt also required cues for reaching back to seated surface.  Supervision for cueing to ensure safe technique  Ambulation/Gait Ambulation/Gait assistance: Supervision Gait Distance (Feet): 300  Feet Assistive device: Rolling walker (2 wheeled) Gait Pattern/deviations: Step-through pattern;Decreased stride length;Trunk flexed;Narrow base of support;Decreased stance time - left;Decreased weight shift to left     General Gait Details: Pt with much improved balance after padding L forefoot to off load sore spot.  He continues to decrease weight left side, but this is much less that previous session.   Stairs             Wheelchair Mobility    Modified Rankin (Stroke Patients Only)       Balance Overall balance assessment: Needs assistance Sitting-balance support: No upper extremity supported Sitting balance-Leahy Scale: Good Sitting balance - Comments: sits unsupported Postural control: Posterior lean Standing balance support: Bilateral upper extremity supported Standing balance-Leahy Scale: Fair                              Cognition Arousal/Alertness: Awake/alert Behavior During Therapy: WFL for tasks assessed/performed Overall Cognitive Status: Within Functional Limits for tasks assessed                                        Exercises      General Comments        Pertinent Vitals/Pain Pain Assessment: 0-10 Pain Location: L foot, lists to the R to offload, taped ABD pad on borders of sore lesion to off load the painful site and he lists less  with padding. Pain Descriptors / Indicators: Guarding;Grimacing;Stabbing (tender when palpated) Pain Intervention(s): Monitored during session;Repositioned    Home Living                      Prior Function            PT Goals (current goals can now be found in the care plan section) Acute Rehab PT Goals Patient Stated Goal: I'd like to go home, but I don't have help Potential to Achieve Goals: Fair Progress towards PT goals: Progressing toward goals    Frequency    Min 3X/week      PT Plan Discharge plan needs to be updated    Co-evaluation               AM-PAC PT "6 Clicks" Mobility   Outcome Measure  Help needed turning from your back to your side while in a flat bed without using bedrails?: None Help needed moving from lying on your back to sitting on the side of a flat bed without using bedrails?: None Help needed moving to and from a bed to a chair (including a wheelchair)?: None Help needed standing up from a chair using your arms (e.g., wheelchair or bedside chair)?: None Help needed to walk in hospital room?: None Help needed climbing 3-5 steps with a railing? : A Little 6 Click Score: 23    End of Session Equipment Utilized During Treatment: Gait belt Activity Tolerance: Patient tolerated treatment well Patient left: in bed;with call bell/phone within reach;with bed alarm set Nurse Communication: Mobility status PT Visit Diagnosis: Unsteadiness on feet (R26.81);Muscle weakness (generalized) (M62.81);Difficulty in walking, not elsewhere classified (R26.2)     Time: 7588-3254 PT Time Calculation (min) (ACUTE ONLY): 26 min  Charges:  $Gait Training: 8-22 mins $Therapeutic Activity: 8-22 mins                     [name] , PTA Acute Rehabilitation Services Pager [phone] Office [phone]     Dan Henry 02/13/2020, 11:01 AM

## 2020-02-14 ENCOUNTER — Telehealth: Payer: Self-pay | Admitting: Internal Medicine

## 2020-02-14 ENCOUNTER — Telehealth: Payer: Self-pay | Admitting: Cardiology

## 2020-02-14 NOTE — Telephone Encounter (Signed)
       I went in pt's chart to see what his discharge summary said

## 2020-02-14 NOTE — Telephone Encounter (Signed)
RTC to patient's niece, [name], who states she was looking over patient's discharge list from the hospital and does not have the following medications in the home:  Folic acid tamsulosin Thiamine  She states patient hasn't been taking these meds for a while.  Per chart review, a limited supply of each medication was sent in from 01/2019 -05/2019.   Patient has a HFU appt scheduled on 02/20/20 w/ Dr. Marva Panda.  Niece instructed to wait until [place] and MD will discuss if there is a need to continue any of the above meds at that visit.  She verbalized understanding and states she will also be present for this visit. SChaplin, RN,BSN

## 2020-02-14 NOTE — Telephone Encounter (Signed)
[name] would like a nurse to call back regarding patient medicine [phone]

## 2020-02-14 NOTE — Telephone Encounter (Signed)
HFU per niece; 10/21 115pm/nw

## 2020-02-15 LAB — CULTURE, BLOOD (ROUTINE X 2)
Culture: NO GROWTH
Culture: NO GROWTH
Special Requests: ADEQUATE
Special Requests: ADEQUATE

## 2020-02-20 ENCOUNTER — Ambulatory Visit: Payer: Self-pay

## 2020-02-20 ENCOUNTER — Ambulatory Visit (INDEPENDENT_AMBULATORY_CARE_PROVIDER_SITE_OTHER): Payer: Medicare HMO | Admitting: Internal Medicine

## 2020-02-20 ENCOUNTER — Other Ambulatory Visit: Payer: Self-pay

## 2020-02-20 ENCOUNTER — Encounter: Payer: Self-pay | Admitting: Internal Medicine

## 2020-02-20 VITALS — BP 128/63 | HR 64 | Temp 98.3°F | Ht 65.0 in | Wt 122.7 lb

## 2020-02-20 DIAGNOSIS — F10231 Alcohol dependence with withdrawal delirium: Secondary | ICD-10-CM

## 2020-02-20 DIAGNOSIS — N179 Acute kidney failure, unspecified: Secondary | ICD-10-CM

## 2020-02-20 DIAGNOSIS — F1021 Alcohol dependence, in remission: Secondary | ICD-10-CM

## 2020-02-20 DIAGNOSIS — D508 Other iron deficiency anemias: Secondary | ICD-10-CM | POA: Diagnosis not present

## 2020-02-20 DIAGNOSIS — F10931 Alcohol use, unspecified with withdrawal delirium: Secondary | ICD-10-CM

## 2020-02-20 MED ORDER — NALTREXONE HCL 50 MG PO TABS: 50.0000 mg | ORAL_TABLET | Freq: Every day | ORAL | 0 refills | Status: DC

## 2020-02-20 MED ORDER — THIAMINE HCL 100 MG PO TABS: 100.0000 mg | ORAL_TABLET | Freq: Every day | ORAL | 3 refills | Status: DC

## 2020-02-20 MED ORDER — FOLIC ACID 1 MG PO TABS: 1.0000 mg | ORAL_TABLET | Freq: Every day | ORAL | 3 refills | Status: DC

## 2020-02-20 NOTE — Patient Instructions (Addendum)
Mr [place],  It was a pleasure seeing you in clinic. Today we discussed:   Alcohol use:  Continue taking naltrexone daily. Continue folic acid and thiamine daily.  I am checking lab work at this visit. I will call you if anything is abnormal.  Continue to take all medications and stay away from alcohol.    If you have any questions or concerns, please call our clinic at [phone] between 9am-5pm and after hours call [phone] and ask for the internal medicine resident on call. If you feel you are having a medical emergency please call 911.   Thank you, we look forward to helping you remain healthy!

## 2020-02-20 NOTE — Chronic Care Management (AMB) (Signed)
  Chronic Care Management   Outreach Note  02/20/2020 Name: Dan Henry MRN: [id number] DOB: 01-Mar-1951  Referred by: [name], MD Reason for referral : Care Coordination (Transport[place] [place] Application )  Received message from [place], [place], that patient requested assistance with completion of [employer] [place] application .  Patient completed most of Part A of application.  Attempted to contact patient via phone as he had already left clinic.  Spoke to patient's niece who states that he does not have a working number.   Explained to niece that certification of services is very unlikely based on answers provided on patient portion of application.  Patient indicated that he is able to walk to bus stop and utilize public bus system. Explained to niece that service is specifically for individuals with a physical or cognitive disability that prevents them from using public bus system.  Informed niece that CCM BSW can assist with application in the future if needed.      Ronn Melena, [place] Coordination Social Worker [place] [phone]

## 2020-02-20 NOTE — Assessment & Plan Note (Addendum)
Dan Henry is following up today from his most recent hospitalization on 9/28-10/11 for alcohol withdrawal. Patient presented to the ED on 9/28 with tremors, diaphoresis, generalized weakness and abdominal pain secondary to alcohol withdrawal. He was also noted to have a high anion gap metabolic acidosis in setting of alcohol withdrawal. His symptoms improved with CIWA protocol and IV fluids. He was started on librium taper and was discharged on naltrexone.  Patient also noted to have significant weakness during admission for which evaluated by PT/OT and recommended for SNF. However, due to inability to be placed, and continued aggressive PT/OT therapy during hospitalization, patient was eventually able to be discharged home with home PT/OT.  Patient endorses that since discharge, he has been taking his naltrexone daily and has not had any alcohol since discharge. He notes a good support system at home and notes that he has good support from his niece and neighbors.   His niece had recent inquiry regarding folic acid and thiamine supplementation. Patient endorses that he was evaluated by physical therapy and noted to have good strength and able to function in his home. He was discharged from home physical therapy. He notes that his gait is much improved. He does still use a cane and sometimes his walker for ambulation. On examination, has 5/5 strength in bilateral upper and lower extremities. No gait imbalances noted. Mild intention tremor on exam.   Plan:  Continue naltrexone 50mg  daily  Continue folic acid 1mg  daily and thiamine 100mg  daily

## 2020-02-20 NOTE — Progress Notes (Signed)
° °  CC: alcohol withdrawal admission f/u  HPI:  [name] is a 70 y.o. male with PMHx as listed below presenting for a hospital follow up. He was admitted on 9/28-10/14 for alcohol withdrawal. He denies any acute concerns at this visit. Please see problem based charting for complete assessment and plan.  Past Medical History:  Diagnosis Date   Alcohol withdrawal delirium ([place]) 02/14/2019   Hypertension    Hypokalemia 07/19/2017   Malnutrition of moderate degree 10/24/2018   Review of Systems:  Negative except as stated in HPI.   Physical Exam:  Vitals:   02/20/20 1312  BP: 128/63  Pulse: 64  Temp: 98.3 F (36.8 C)  TempSrc: Oral  SpO2: 100%  Weight: 122 lb 11.2 oz (55.7 kg)  Height: 5\' 5"  (1.651 m)   Physical Exam  Constitutional: Appears well-developed and well-nourished. No distress.  HENT: Normocephalic and atraumatic, EOMI, conjunctiva normal, moist mucous membranes, poor dentition Cardiovascular: Normal rate, regular rhythm, S1 and S2 present, no murmurs, rubs, gallops.  Distal pulses intact Respiratory: No respiratory distress, no accessory muscle use.  Effort is normal.  Lungs are clear to auscultation bilaterally. Musculoskeletal: Normal bulk and tone.  No peripheral edema noted. 5/5 strength in bilateral upper and lower extremities Skin: Warm and dry.  No rash, erythema, lesions noted. Psychiatric: Normal mood and affect. Behavior is normal. Judgment and thought content normal.    Assessment & Plan:   See Encounters Tab for problem based charting.  Patient discussed with Dr. Dareen Piano

## 2020-02-21 ENCOUNTER — Encounter: Payer: Self-pay | Admitting: Internal Medicine

## 2020-02-21 LAB — CMP14 + ANION GAP
ALT: 24 IU/L (ref 0–44)
AST: 29 IU/L (ref 0–40)
Albumin/Globulin Ratio: 1.5 (ref 1.2–2.2)
Albumin: 4.6 g/dL (ref 3.8–4.8)
Alkaline Phosphatase: 62 IU/L (ref 44–121)
Anion Gap: 15 mmol/L (ref 10.0–18.0)
BUN/Creatinine Ratio: 10 (ref 10–24)
BUN: 13 mg/dL (ref 8–27)
Bilirubin Total: 0.4 mg/dL (ref 0.0–1.2)
CO2: 21 mmol/L (ref 20–29)
Calcium: 9.9 mg/dL (ref 8.6–10.2)
Chloride: 105 mmol/L (ref 96–106)
Creatinine, Ser: 1.31 mg/dL — ABNORMAL HIGH (ref 0.76–1.27)
GFR calc Af Amer: 64 mL/min/{1.73_m2} (ref >59)
GFR calc non Af Amer: 55 mL/min/{1.73_m2} — ABNORMAL LOW (ref >59)
Globulin, Total: 3 g/dL (ref 1.5–4.5)
Glucose: 108 mg/dL — ABNORMAL HIGH (ref 65–99)
Potassium: 5.2 mmol/L (ref 3.5–5.2)
Sodium: 141 mmol/L (ref 134–144)
Total Protein: 7.6 g/dL (ref 6.0–8.5)

## 2020-02-21 LAB — CBC
Hematocrit: 37.4 % — ABNORMAL LOW (ref 37.5–51.0)
Hemoglobin: 12.2 g/dL — ABNORMAL LOW (ref 13.0–17.7)
MCH: 28 pg (ref 26.6–33.0)
MCHC: 32.6 g/dL (ref 31.5–35.7)
MCV: 86 fL (ref 79–97)
Platelets: 378 10*3/uL (ref 150–450)
RBC: 4.35 x10E6/uL (ref 4.14–5.80)
RDW: 17.9 % — ABNORMAL HIGH (ref 11.6–15.4)
WBC: 6.1 10*3/uL (ref 3.4–10.8)

## 2020-02-21 NOTE — Assessment & Plan Note (Signed)
Baseline sCr of 1.0; noted to be elevated to 1.3 at this visit. Patient is on lisinopril 10mg  daily but this appears to be a long term medication. No recent adjustments in other medications. He denies any change in urinary habits.   Plan: BMP at next visit in 1-2 weeks

## 2020-02-21 NOTE — Assessment & Plan Note (Signed)
Prior history of iron deficiency anemia in setting of poor dietary intake on iron supplementation therapy. CBC today with normocytic anemia. Had mild thrombocytosis during recent hospitalization, resolved on CBC today. Likely secondary to dehydration  Plan: Continue ferrous sulfate 325mg  daily

## 2020-02-24 NOTE — Progress Notes (Signed)
Internal Medicine Clinic Attending ° °Case discussed with Dr. Aslam  At the time of the visit.  We reviewed the resident’s history and exam and pertinent patient test results.  I agree with the assessment, diagnosis, and plan of care documented in the resident’s note.  °

## 2020-02-25 ENCOUNTER — Telehealth: Payer: Self-pay

## 2020-02-25 NOTE — Telephone Encounter (Signed)
  Chronic Care Management   Outreach Note  02/25/2020 Name: Dan Henry MRN: [id number] DOB: 1950/10/17  Referred by: [name], MD Reason for referral : Care Coordination (transportation/[place])  Patient contacted CCM BSW today regarding assistance with [place] application.  Explained to patient that service is for those that have a physical or cognitive disability that prevents them from using the public bus system.  Informed patient that, based on information he provided on application that was dropped off at clinic last week, he would not be certified for services. Patient acknowledged that he is able to use public bus system and verbalized understanding that application was not submitted.    Ronn Melena, [place] Coordination Social Worker [place] [phone]

## 2020-02-25 NOTE — Addendum Note (Signed)
Addended by: Harvie Heck on: 02/25/2020 02:03 PM   Modules accepted: Orders

## 2020-03-09 ENCOUNTER — Other Ambulatory Visit: Payer: Self-pay

## 2020-03-09 ENCOUNTER — Other Ambulatory Visit: Payer: Medicare HMO

## 2020-03-09 DIAGNOSIS — N179 Acute kidney failure, unspecified: Secondary | ICD-10-CM | POA: Diagnosis not present

## 2020-03-10 ENCOUNTER — Other Ambulatory Visit: Payer: Self-pay | Admitting: *Deleted

## 2020-03-10 DIAGNOSIS — I10 Essential (primary) hypertension: Secondary | ICD-10-CM

## 2020-03-10 LAB — BMP8+ANION GAP
Anion Gap: 18 mmol/L (ref 10.0–18.0)
BUN/Creatinine Ratio: 13 (ref 10–24)
BUN: 12 mg/dL (ref 8–27)
CO2: 20 mmol/L (ref 20–29)
Calcium: 9.8 mg/dL (ref 8.6–10.2)
Chloride: 103 mmol/L (ref 96–106)
Creatinine, Ser: 0.94 mg/dL (ref 0.76–1.27)
GFR calc Af Amer: 95 mL/min/{1.73_m2} (ref >59)
GFR calc non Af Amer: 82 mL/min/{1.73_m2} (ref >59)
Glucose: 92 mg/dL (ref 65–99)
Potassium: 5 mmol/L (ref 3.5–5.2)
Sodium: 141 mmol/L (ref 134–144)

## 2020-03-10 MED ORDER — AMLODIPINE BESYLATE 10 MG PO TABS: 10.0000 mg | ORAL_TABLET | Freq: Every day | ORAL | 2 refills | Status: DC

## 2020-03-10 MED ORDER — LISINOPRIL 10 MG PO TABS: 10.0000 mg | ORAL_TABLET | Freq: Every day | ORAL | 2 refills | Status: DC

## 2020-03-25 ENCOUNTER — Other Ambulatory Visit: Payer: Self-pay

## 2020-03-25 ENCOUNTER — Ambulatory Visit (INDEPENDENT_AMBULATORY_CARE_PROVIDER_SITE_OTHER): Payer: Medicare HMO | Admitting: Cardiology

## 2020-03-25 ENCOUNTER — Encounter: Payer: Self-pay | Admitting: Cardiology

## 2020-03-25 VITALS — BP 110/60 | HR 77 | Ht 65.0 in | Wt 124.0 lb

## 2020-03-25 DIAGNOSIS — I10 Essential (primary) hypertension: Secondary | ICD-10-CM

## 2020-03-25 DIAGNOSIS — R079 Chest pain, unspecified: Secondary | ICD-10-CM

## 2020-03-25 NOTE — Progress Notes (Signed)
Cardiology Office Note:    Date:  03/25/2020   ID:  Dan Henry, DOB Sep 26, 1950, MRN [id number]  PCP:  [name], MD  [facility] Cardiologist:  Candee Furbish, MD  [facility] Electrophysiologist:  None   Referring MD: [name], MD     History of Present Illness:    Dan Henry is a 69 y.o. male here for the follow-up of chest pain.  Has had hospitalizations with alcohol withdrawal.  Had anemia with hemoglobin of 7 at one point with ferritin of 26.  Hypertension has been an issue.  Difficult to control.  Previously in January 2021 told his PCP that he was feeling anginal symptoms.  Chest pain was exertional after 20 minutes of work.  Relieved with rest.  Left-sided.  T waves were slightly abnormal but no acute changes.  Unchanged from prior EKG.  I recommended cardiac catheterization at that time.  He canceled the catheterization and Lexiscan stress test was ordered.   Nuclear stress EF: 65%. The left ventricular ejection fraction is normal (55-65%).  Upsloping ST segment depression ST segment depression of 0.5 mm was noted during stress in the II, III and aVF leads.  This is a low risk study. There is no evidence of ischemia or previous infarction.  The study is normal. Reassuring.  Felt like OK to proceed with GI workup.    Past Medical History:  Diagnosis Date   Alcohol withdrawal delirium ([place]) 02/14/2019   Hypertension    Hypokalemia 07/19/2017   Malnutrition of moderate degree 10/24/2018   Rhabdomyolysis 10/26/2018    Past Surgical History:  Procedure Laterality Date   NO PAST SURGERIES      Current Medications: Current Meds  Medication Sig   amLODipine (NORVASC) 10 MG tablet Take 1 tablet (10 mg total) by mouth daily.   atorvastatin (LIPITOR) 40 MG tablet Take 1 tablet (40 mg total) by mouth daily.   feeding supplement (ENSURE ENLIVE / ENSURE PLUS) LIQD Take 237 mLs by mouth 3 (three) times daily between meals.   ferrous  sulfate 325 (65 FE) MG tablet TAKE 1 TABLET BY MOUTH EVERY DAY   folic acid (FOLVITE) 1 MG tablet Take 1 tablet (1 mg total) by mouth daily.   lisinopril (ZESTRIL) 10 MG tablet Take 1 tablet (10 mg total) by mouth daily.   metoprolol tartrate (LOPRESSOR) 25 MG tablet Take 1 tablet (25 mg total) by mouth 2 (two) times daily.   Multiple Vitamin (MULTIVITAMIN WITH MINERALS) TABS tablet Take 1 tablet by mouth every morning.   naltrexone (DEPADE) 50 MG tablet Take 1 tablet (50 mg total) by mouth daily.   nitroGLYCERIN (NITROSTAT) 0.4 MG SL tablet Place 1 tablet (0.4 mg total) under the tongue every 5 (five) minutes as needed for chest pain.   thiamine 100 MG tablet Take 1 tablet (100 mg total) by mouth daily.     Allergies:   Patient has no known allergies.   Social History   Socioeconomic History   Marital status: Single    Spouse name: Not on file   Number of children: 0   Years of education: 14   Highest education level: Not on file  Occupational History   Occupation: Retired  Tobacco Use   Smoking status: Never Smoker   Smokeless tobacco: Never Used  Scientific laboratory technician Use: Never used  Substance and Sexual Activity   Alcohol use: Not Currently    Alcohol/week: 4.0 standard drinks    Types: 4  Standard drinks or equivalent per week    Comment: occ   Drug use: No   Sexual activity: Not on file  Other Topics Concern   Not on file  Social History Narrative   Lives alone   Caffeine use: none   Retired: Worked in Administrator, arts part time, 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   Social Determinants of Radio broadcast assistant Strain:    Difficulty of Paying Living Expenses: Not on file  Food Insecurity:    Worried About Charity fundraiser in the Last Year: Not on file   [place] in the Last Year: Not on file  Transportation Needs:    Film/video editor (Medical): Not on file   Lack of Transportation (Non-Medical): Not on file   Physical Activity:    Days of Exercise per Week: Not on file   Minutes of Exercise per Session: Not on file  Stress:    Feeling of Stress : Not on file  Social Connections:    Frequency of Communication with Friends and Family: Not on file   Frequency of Social Gatherings with Friends and Family: Not on file   Attends Religious Services: Not on file   Active Member of Clubs or Organizations: Not on file   Attends Archivist Meetings: Not on file   Marital Status: Not on file     Family History: The patient's family history includes Cancer in his brother; Hypertension in his father, mother, sister, and sister; Stroke in his sister.  ROS:   Please see the history of present illness.    Denies any fevers chills nausea vomiting syncope bleeding all other systems reviewed and are negative.  EKGs/Labs/Other Studies Reviewed:    The following studies were reviewed today: Stress test reviewed Recent Labs: 02/01/2020: Magnesium 2.2 02/20/2020: ALT 24; Hemoglobin 12.2; Platelets 378 03/09/2020: BUN 12; Creatinine, Ser 0.94; Potassium 5.0; Sodium 141  Recent Lipid Panel    Component Value Date/Time   CHOL 173 05/08/2017 1030   TRIG 50 05/08/2017 1030   HDL 104 05/08/2017 1030   CHOLHDL 1.7 05/08/2017 1030   LDLCALC 59 05/08/2017 1030     Risk Assessment/Calculations:       Physical Exam:    VS:  BP 110/60    Pulse 77    Ht 5\' 5"  (1.651 m)    Wt 124 lb (56.2 kg)    SpO2 98%    BMI 20.63 kg/m     Wt Readings from Last 3 Encounters:  03/25/20 124 lb (56.2 kg)  02/20/20 122 lb 11.2 oz (55.7 kg)  02/13/20 122 lb 1.6 oz (55.4 kg)     GEN:  Well nourished, well developed in no acute distress HEENT: Normal NECK: No JVD; No carotid bruits LYMPHATICS: No lymphadenopathy CARDIAC: RRR, no murmurs, rubs, gallops RESPIRATORY:  Clear to auscultation without rales, wheezing or rhonchi  ABDOMEN: Soft, non-tender, non-distended MUSCULOSKELETAL:  No edema; No  deformity  SKIN: Warm and dry NEUROLOGIC:  Alert and oriented x 3 PSYCHIATRIC:  Normal affect   ASSESSMENT:    1. Chest pain, unspecified type   2. Essential hypertension    PLAN:    In order of problems listed above:  Chest pain -Hemoglobin was 7 at 1 point.  Now stable at 12.2.  Nuclear stress test was performed.  Low risk.  Okay to proceed with GI work-up if felt necessary.  Likely etiology given excessive alcohol use.  Hyperlipidemia -On atorvastatin 40  mg.  Managed by primary team.  Last LDL 59 from outside labs.  ALT 24  Hypertension -Good overall control with lisinopril.  Excellent.  Alcohol use -Continue cessation.  Multiple hospitalizations.  On naltrexone.     Medication Adjustments/Labs and Tests Ordered: Current medicines are reviewed at length with the patient today.  Concerns regarding medicines are outlined above.  No orders of the defined types were placed in this encounter.  No orders of the defined types were placed in this encounter.   Patient Instructions  Medication Instructions:  The current medical regimen is effective;  continue present plan and medications.  *If you need a refill on your cardiac medications before your next appointment, please call your pharmacy*  Follow-Up: At [facility], you and your health needs are our priority.  As part of our continuing mission to provide you with exceptional heart care, we have created designated Provider Care Teams.  These Care Teams include your primary Cardiologist (physician) and Advanced Practice Providers (APPs -  Physician Assistants and Nurse Practitioners) who all work together to provide you with the care you need, when you need it.  We recommend signing up for the patient portal called "MyChart".  Sign up information is provided on this After Visit Summary.  MyChart is used to connect with patients for Virtual Visits (Telemedicine).  Patients are able to view lab/test results, encounter  notes, upcoming appointments, etc.  Non-urgent messages can be sent to your provider as well.   To learn more about what you can do with MyChart, go to [email or web address].    Follow up as needed with Dr Marlou Porch.   Thank you for choosing [facility]!!        Signed, Candee Furbish, MD  03/25/2020 2:17 PM    [facility]

## 2020-03-25 NOTE — Patient Instructions (Signed)
Medication Instructions:  The current medical regimen is effective;  continue present plan and medications.  *If you need a refill on your cardiac medications before your next appointment, please call your pharmacy*  Follow-Up: At [facility], you and your health needs are our priority.  As part of our continuing mission to provide you with exceptional heart care, we have created designated Provider Care Teams.  These Care Teams include your primary Cardiologist (physician) and Advanced Practice Providers (APPs -  Physician Assistants and Nurse Practitioners) who all work together to provide you with the care you need, when you need it.  We recommend signing up for the patient portal called "MyChart".  Sign up information is provided on this After Visit Summary.  MyChart is used to connect with patients for Virtual Visits (Telemedicine).  Patients are able to view lab/test results, encounter notes, upcoming appointments, etc.  Non-urgent messages can be sent to your provider as well.   To learn more about what you can do with MyChart, go to [email or web address].    Follow up as needed with Dr Marlou Porch.   Thank you for choosing [facility]!!

## 2020-04-02 ENCOUNTER — Encounter: Payer: Self-pay | Admitting: *Deleted

## 2020-04-02 ENCOUNTER — Encounter: Payer: Self-pay | Admitting: Internal Medicine

## 2020-04-02 NOTE — Progress Notes (Unsigned)

## 2020-04-02 NOTE — Progress Notes (Unsigned)
Things That May Be Affecting Your Health:  Alcohol  Hearing loss  Pain    Depression  Home Safety  Sexual Health   Diabetes  Lack of physical activity  Stress   Difficulty with daily activities  Loneliness  Tiredness   Drug use  Medicines  Tobacco use   Falls  Motor Vehicle Safety  Weight   Food choices  Oral Health  Other    YOUR PERSONALIZED HEALTH PLAN : 1. Schedule your next subsequent Medicare Wellness visit in one year 2. Attend all of your regular appointments to address your medical issues 3. Complete the preventative screenings and services   Annual Wellness Visit   Medicare Covered Preventative Screenings and Intercourse Men and Women Who How Often Need? Date of Last Service Action  Abdominal Aortic Aneurysm Adults with AAA risk factors Once     Alcohol Misuse and Counseling All Adults Screening once a year if no alcohol misuse. Counseling up to 4 face to face sessions. X    Bone Density Measurement  Adults at risk for osteoporosis Once every 2 yrs     Lipid Panel Z13.6 All adults without CV disease Once every 5 yrs     Colorectal Cancer   Stool sample or  Colonoscopy All adults 75 and older   Once every year  Every 10 years X    Depression All Adults Once a year  Today   Diabetes Screening Blood glucose, post glucose load, or GTT Z13.1  All adults at risk  Pre-diabetics  Once per year  Twice per year     Diabetes  Self-Management Training All adults Diabetics 10 hrs first year; 2 hours subsequent years. Requires Copay     Glaucoma  Diabetics  Family history of glaucoma  African Americans 36 yrs +  Hispanic Americans 52 yrs + Annually - requires coppay     Hepatitis C Z72.89 or F19.20  High Risk for HCV  Born between 1945 and 1965  Annually  Once     HIV Z11.4 All adults based on risk  Annually btw ages 1 & 57 regardless of risk  Annually > 65 yrs if at increased risk     Lung Cancer Screening Asymptomatic adults aged  35-77 with 30 pack yr history and current smoker OR quit within the last 15 yrs Annually Must have counseling and shared decision making documentation before first screen     Medical Nutrition Therapy Adults with   Diabetes  Renal disease  Kidney transplant within past 3 yrs 3 hours first year; 2 hours subsequent years X    Obesity and Counseling All adults Screening once a year Counseling if BMI 30 or higher  Today   Tobacco Use Counseling Adults who use tobacco  Up to 8 visits in one year     Vaccines Z23  Hepatitis B  Influenza   Pneumonia  Adults   Once  Once every flu season  Two different vaccines separated by one year     Next Annual Wellness Visit People with Medicare Every year  Today     Services & Screenings Women Who How Often Need  Date of Last Service Action  Mammogram  Z12.31 Women over 55 One baseline ages 63-39. Annually ager 40 yrs+     Pap tests All women Annually if high risk. Every 2 yrs for normal risk women     Screening for cervical cancer with   Pap (Z01.419 nl or Z01.411abnl) &  HPV Z11.51 Women aged 11 to 88 Once every 5 yrs     Screening pelvic and breast exams All women Annually if high risk. Every 2 yrs for normal risk women     Sexually Transmitted Diseases  Chlamydia  Gonorrhea  Syphilis All at risk adults Annually for non pregnant females at increased risk         [place] Men Who How Ofter Need  Date of Last Service Action  Prostate Cancer - DRE & PSA Men over 50 Annually.  DRE might require a copay.     Sexually Transmitted Diseases  Syphilis All at risk adults Annually for men at increased risk

## 2020-05-21 ENCOUNTER — Ambulatory Visit (INDEPENDENT_AMBULATORY_CARE_PROVIDER_SITE_OTHER): Payer: Medicare HMO | Admitting: Internal Medicine

## 2020-05-21 ENCOUNTER — Encounter: Payer: Self-pay | Admitting: Internal Medicine

## 2020-05-21 VITALS — BP 186/85 | HR 62 | Temp 98.3°F | Ht 64.0 in | Wt 121.9 lb

## 2020-05-21 DIAGNOSIS — I10 Essential (primary) hypertension: Secondary | ICD-10-CM

## 2020-05-21 DIAGNOSIS — N179 Acute kidney failure, unspecified: Secondary | ICD-10-CM

## 2020-05-21 DIAGNOSIS — R443 Hallucinations, unspecified: Secondary | ICD-10-CM

## 2020-05-21 DIAGNOSIS — R441 Visual hallucinations: Secondary | ICD-10-CM

## 2020-05-21 MED ORDER — QUETIAPINE FUMARATE 25 MG PO TABS: 25.0000 mg | ORAL_TABLET | Freq: Every day | ORAL | 0 refills | Status: DC

## 2020-05-21 NOTE — Patient Instructions (Addendum)
[name]  It was a pleasure seeing you in clinic. Today we discussed:   Blood pressure:   Please continue to take all medications as prescribed. Please bring your medications to your next visit.   Hallucinations: I am starting you on a medication to help with this. Please start taking Seroquel 25mg  every night at bedtime. I will also send a referral to a psychiatrist to help with this.    If you have any questions or concerns, please call our clinic at [phone] between 9am-5pm and after hours call [phone] and ask for the internal medicine resident on call. If you feel you are having a medical emergency please call 911.   Thank you, we look forward to helping you remain healthy!

## 2020-05-21 NOTE — Progress Notes (Signed)
   CC: hypertension and visual hallucinations   HPI:  [name] is a 70 y.o. male with PMHx as listed below presenting for follow up of hypertension and visual hallucinations. Patient endorses having a few episodes of visual hallucinations over the past few weeks, at least once weekly in which he sees faces of people talking but is unable to make out what they are saying. He notes that these faces are not familiar. He notes that the episodes last for about one day in which he sees the faces on the walls of every room. He notes that the episodes go away with sleep but also notes that he is having trouble sleeping over the past month and is only sleeping 4-5 hours/night. He does also report a mild headache for the past week but denies any association with hallucinations. He noted to have one episode of diaphoresis and nausea on Monday after eating lunch that lasted for a few hours and subsided on its own.  Please see problem based charting for complete assessment and plan.  Past Medical History:  Diagnosis Date  . Alcohol intoxication ([place]) 11/18/2019  . Alcohol withdrawal ([place]) 04/23/2019  . Alcohol withdrawal delirium ([place]) 02/14/2019  . Hypertension   . Hypokalemia 07/19/2017  . Lactic acidosis 02/20/2018  . Malnutrition of moderate degree 10/24/2018  . Nosebleed 04/23/2019  . Rhabdomyolysis 10/26/2018   Review of Systems:  Negative except as stated in HPI.  Physical Exam:  Vitals:   05/21/20 1314 05/21/20 1318 05/21/20 1340  BP: (!) 199/86 (!) 175/100 (!) 186/85  Pulse: 69 64 62  Temp: 98.3 F (36.8 C)    TempSrc: Oral    SpO2: 100%    Weight: 121 lb 14.4 oz (55.3 kg)    Height: 5\' 4"  (1.626 m)     Physical Exam  Constitutional: Appears well-developed and well-nourished. No distress.  HENT: Normocephalic and atraumatic, EOMI, conjunctiva normal, moist mucous membranes Cardiovascular: Normal rate, regular rhythm, S1 and S2 present, no murmurs, rubs, gallops.  Distal pulses  intact Respiratory: No respiratory distress, no accessory muscle use.  Effort is normal.  Lungs are clear to auscultation bilaterally. GI: Nondistended, soft, nontender to palpation, normal active bowel sounds Musculoskeletal: Normal bulk and tone.  No peripheral edema noted. Neurological: Is alert and oriented x4, no apparent focal deficits noted. Normal gait Psychiatric: Normal mood and affect. Behavior is normal. Judgment and thought content normal. MOCA score 14/30.    Assessment & Plan:   See Encounters Tab for problem based charting.  Patient discussed with Dr. Evette Doffing

## 2020-05-22 LAB — BMP8+ANION GAP
Anion Gap: 15 mmol/L (ref 10.0–18.0)
BUN/Creatinine Ratio: 13 (ref 10–24)
BUN: 12 mg/dL (ref 8–27)
CO2: 23 mmol/L (ref 20–29)
Calcium: 9.8 mg/dL (ref 8.6–10.2)
Chloride: 100 mmol/L (ref 96–106)
Creatinine, Ser: 0.95 mg/dL (ref 0.76–1.27)
GFR calc Af Amer: 94 mL/min/{1.73_m2} (ref >59)
GFR calc non Af Amer: 81 mL/min/{1.73_m2} (ref >59)
Glucose: 97 mg/dL (ref 65–99)
Potassium: 4.1 mmol/L (ref 3.5–5.2)
Sodium: 138 mmol/L (ref 134–144)

## 2020-05-22 LAB — CBC
Hematocrit: 41 % (ref 37.5–51.0)
Hemoglobin: 13.4 g/dL (ref 13.0–17.7)
MCH: 28.7 pg (ref 26.6–33.0)
MCHC: 32.7 g/dL (ref 31.5–35.7)
MCV: 88 fL (ref 79–97)
Platelets: 202 10*3/uL (ref 150–450)
RBC: 4.67 x10E6/uL (ref 4.14–5.80)
RDW: 15 % (ref 11.6–15.4)
WBC: 5.3 10*3/uL (ref 3.4–10.8)

## 2020-05-22 LAB — URINALYSIS, ROUTINE W REFLEX MICROSCOPIC
Bilirubin, UA: NEGATIVE
Glucose, UA: NEGATIVE
Ketones, UA: NEGATIVE
Leukocytes,UA: NEGATIVE
Nitrite, UA: NEGATIVE
RBC, UA: NEGATIVE
Specific Gravity, UA: 1.013 (ref 1.005–1.030)
Urobilinogen, Ur: 0.2 mg/dL (ref 0.2–1.0)
pH, UA: 6.5 (ref 5.0–7.5)

## 2020-05-22 NOTE — Assessment & Plan Note (Addendum)
Dan Henry reports having visual hallucinations for about two weeks now. He endorses having few episodes, at least once weekly in which he sees faces on the wall taking but is unable to hear them. He is aware that these images are not real and the faces are not familiar. He endorses that the episodes can last for hours and up to a whole day. The most recent episode was on Monday. Visual hallucinations improve with sleep; however, he has had trouble sleeping over the past month and endorses only sleeping 4-5 hours per night (usually 6-7 hours). He also occasionally has a mild headache over the past week but denies any association with the hallucinations. He denies any fevers or chills but does not one episode of diaphoresis and nausea on Monday after eating lunch that lasted for a few hours and subsided on its own. He denies any vision changes, gait imbalance, recent falls, or urinary symptoms. He has not had any recent changes in medications and endorses taking all medications as prescribed, including the naltrexone. He does admit to drinking one can of beer last week and denies any other substance use.   Of note, patient has been evaluated for this previously with last episode occurring in 04/2019 when his symptoms were initially thought to be secondary to underlying psychiatric illness vs substance abuse. He became symptomatic at the time and was admitted for alcohol withdrawal.  At this visit, patient is hypertensive, although this is likely due to medication nonadherence vs alcohol withdrawal as he does not appear diaphoretic or tremulous and no other symptoms of withdrawal at this time. Neuro examination wnl.  Does not appear to be in acute psychosis at this time. He is not on any medication that could be contributing to his symptoms. His lab work including CBC, BMP, and Urinalysis are wnl. MOCA score 14/30, suggestive of dementia. Given his visual hallucinations, there is concern for possible Lewy body  dementia; however, no gait ataxia or other neurologic deficits appreciated on exam. Suspect that his chronic alcohol use and dementia may be contributing to an underlying psychiatric illness.   Plan: - Seroquel 25mg  qHS - Referral to psychiatry - F/u urine tox - F/u in 1 week

## 2020-05-22 NOTE — Assessment & Plan Note (Signed)
BP Readings from Last 3 Encounters:  05/21/20 (!) 186/85  03/25/20 110/60  02/20/20 128/63   [name] has a history of poorly controlled hypertension in setting of medication noncompliance and dietary indiscretion. Although he endorses taking his medications as prescribed, I doubt that this is the case. He does note that his niece helps him with medication management. I asked the patient to take his medications as prescribed and to bring all of his medications to the next visit. He denies any headache, vision changes, or focal deficits at this time.   Plan:  - Continue amlodipine 10mg  daily, lisinopril 10mg  daily, and metoprolol 50mg  bid - Follow up in 1 week for hypertension follow up

## 2020-05-26 NOTE — Progress Notes (Signed)
Internal Medicine Clinic Attending ° °Case discussed with Dr. Aslam  At the time of the visit.  We reviewed the resident’s history and exam and pertinent patient test results.  I agree with the assessment, diagnosis, and plan of care documented in the resident’s note.  °

## 2020-05-27 ENCOUNTER — Telehealth: Payer: Self-pay | Admitting: Behavioral Health

## 2020-05-27 LAB — TOXASSURE SELECT,+ANTIDEPR,UR

## 2020-05-27 NOTE — Telephone Encounter (Signed)
Contacted Pt at home phone & lft msg to return call. Contacted Pt's Niece Sofia Jaquith & arranged a tentative appt for 06/04/2020 @ 1:30pm. Niece is speaking w/him today & will confirm plans for phone or f25f on Thur.  Dr. Theodis Shove

## 2020-05-28 ENCOUNTER — Encounter: Payer: Medicare HMO | Admitting: Internal Medicine

## 2020-06-04 ENCOUNTER — Institutional Professional Consult (permissible substitution): Payer: Medicare HMO | Admitting: Behavioral Health

## 2020-06-10 ENCOUNTER — Other Ambulatory Visit: Payer: Self-pay

## 2020-06-10 ENCOUNTER — Ambulatory Visit (INDEPENDENT_AMBULATORY_CARE_PROVIDER_SITE_OTHER): Payer: Medicare HMO | Admitting: Internal Medicine

## 2020-06-10 ENCOUNTER — Encounter: Payer: Self-pay | Admitting: Internal Medicine

## 2020-06-10 VITALS — BP 172/83 | HR 90 | Temp 97.9°F | Ht 64.0 in | Wt 128.8 lb

## 2020-06-10 DIAGNOSIS — R441 Visual hallucinations: Secondary | ICD-10-CM | POA: Diagnosis not present

## 2020-06-10 DIAGNOSIS — F09 Unspecified mental disorder due to known physiological condition: Secondary | ICD-10-CM

## 2020-06-10 DIAGNOSIS — I208 Other forms of angina pectoris: Secondary | ICD-10-CM | POA: Diagnosis not present

## 2020-06-10 DIAGNOSIS — I10 Essential (primary) hypertension: Secondary | ICD-10-CM

## 2020-06-10 DIAGNOSIS — F1021 Alcohol dependence, in remission: Secondary | ICD-10-CM | POA: Diagnosis not present

## 2020-06-10 DIAGNOSIS — I209 Angina pectoris, unspecified: Secondary | ICD-10-CM | POA: Diagnosis not present

## 2020-06-10 MED ORDER — NITROGLYCERIN 0.4 MG SL SUBL: 0.4000 mg | SUBLINGUAL_TABLET | SUBLINGUAL | 3 refills | Status: AC | PRN

## 2020-06-10 MED ORDER — QUETIAPINE FUMARATE 25 MG PO TABS: 25.0000 mg | ORAL_TABLET | Freq: Every day | ORAL | 1 refills | Status: DC

## 2020-06-10 MED ORDER — THIAMINE HCL 100 MG PO TABS: 100.0000 mg | ORAL_TABLET | Freq: Every day | ORAL | 3 refills | Status: DC

## 2020-06-10 MED ORDER — LISINOPRIL 10 MG PO TABS: 10.0000 mg | ORAL_TABLET | Freq: Every day | ORAL | 1 refills | Status: DC

## 2020-06-10 MED ORDER — NALTREXONE HCL 50 MG PO TABS: 50.0000 mg | ORAL_TABLET | Freq: Every day | ORAL | 1 refills | Status: DC

## 2020-06-10 MED ORDER — METOPROLOL TARTRATE 25 MG PO TABS: 25.0000 mg | ORAL_TABLET | Freq: Two times a day (BID) | ORAL | 1 refills | Status: DC

## 2020-06-10 NOTE — Assessment & Plan Note (Signed)
Mr. Weninger states he continues to have chest pain only when exerting himself that relieves with rest or with nitro.  He denies any chest pain at rest.  Mr. Lewallen states that he is no longer taking atorvastatin, as he believed he no longer needed it.  He is willing to take it if needed though.  Assessment/plan: Patient has a previous history of CAD with last Myoview in January 2021.  He continues to have stable angina that is well controlled.  We will check a lipid panel for risk factor management and decide whether he needs a statin.  -Lipid panel pending -Refilled nitro -We will continue working on blood pressure management  ADDENDUM:  Lipid panel with LDL within goal of < 70 at 55. Although guidelines indicate a statin, patient already has pill burden and uncontrolled HTN. I would like him to focus on his blood pressure and AUD medications. We can re-discuss the addition of a statin in the future

## 2020-06-10 NOTE — Patient Instructions (Addendum)
It was nice seeing you today! Thank you for choosing [facility] for your Primary Care.    Today we talked about:   1. I have refilled your Seroquel. I am so happy to hear this has helped with the hallucinations.  2. I have refilled your blood pressure medications. Please bring in your blood pressure log with you to your next appointment.  3. I will check some labs today. I will call if any are abnormal.    Follow up with your primary care doctor in 2 months.

## 2020-06-10 NOTE — Progress Notes (Signed)
   CC: Hallucinations follow up  HPI:  Dan Henry is a 70 y.o. with a PMHx as listed below who presents to the clinic for hallucinations follow up.   Please see the Encounters tab for problem-based Assessment & Plan regarding status of patient's acute and chronic conditions.  Past Medical History:  Diagnosis Date  . Alcohol intoxication ([place]) 11/18/2019  . Alcohol withdrawal ([place]) 04/23/2019  . Alcohol withdrawal delirium ([place]) 02/14/2019  . Hypertension   . Hypokalemia 07/19/2017  . Lactic acidosis 02/20/2018  . Malnutrition of moderate degree 10/24/2018  . Nosebleed 04/23/2019  . Rhabdomyolysis 10/26/2018   Review of Systems: Review of Systems  Constitutional: Negative for chills, fever and weight loss.  Respiratory: Negative for shortness of breath and wheezing.   Cardiovascular: Positive for chest pain (w/ exertion). Negative for palpitations.  Gastrointestinal: Negative for abdominal pain, diarrhea, nausea and vomiting.  Neurological: Negative for dizziness and headaches.  Psychiatric/Behavioral: Positive for hallucinations (resolved). Negative for depression, substance abuse and suicidal ideas.   Physical Exam:  Vitals:   06/10/20 1334  BP: (!) 172/83  Pulse: 90  Temp: 97.9 F (36.6 C)  TempSrc: Oral  SpO2: 99%  Weight: 128 lb 12.8 oz (58.4 kg)  Height: 5\' 4"  (1.626 m)   Physical Exam Constitutional:      General: He is not in acute distress.    Appearance: He is normal weight.  Cardiovascular:     Rate and Rhythm: Normal rate and regular rhythm.     Heart sounds: No murmur heard. No gallop.   Pulmonary:     Effort: Pulmonary effort is normal. No respiratory distress.     Breath sounds: No wheezing, rhonchi or rales.  Skin:    General: Skin is warm and dry.  Neurological:     General: No focal deficit present.     Mental Status: He is alert and oriented to person, place, and time.     Motor: No weakness.     Gait: Gait normal.     Comments: MOCA:  18/30 No cogwheel rigidity. No tremors.  Psychiatric:        Mood and Affect: Mood normal.        Behavior: Behavior normal.     Assessment & Plan:   See Encounters Tab for problem based charting.  Patient discussed with Dr. Jimmye Norman

## 2020-06-10 NOTE — Assessment & Plan Note (Signed)
Dan Henry had a MoCA at his last visit approximately a month ago that showed a score of 13 out of 30.  At the time he was also experiencing visual hallucinations that was concerning for Lewy body dementia versus undiagnosed schizophrenia versus alcohol induced dementia.  MoCA was reobtained today and results are persistently low but slightly improved at 18 out of 30.  -Continue to monitor

## 2020-06-10 NOTE — Assessment & Plan Note (Signed)
Dan Henry states that he continues to take his amlodipine, lisinopril and metoprolol daily.  He denies any dizziness or headaches with this.  He notes that he knows his blood pressure is always high at office visits, but on his home monitor, it is never as high as it is here.  He cannot recall the values that it is at home, but will bring his blood pressure log for the next time.  Assessment/plan: Blood pressure markedly elevated today at 172/83.  Per chart review, it seems that blood pressure is consistently elevated office visits.  However he was recently admitted to the hospital in October 2021, at which point he was on the same regimen and his blood pressure during hospitalization was consistently 726-203 systolics.  I suspect there is a large portion of whitecoat syndrome associated with this.  We will not make any medication adjustments at this time and just follow-up his home measurements.  -Refilled lisinopril 10 mg daily -Continue amlodipine 10 mg daily -Refilled metoprolol 25 mg twice daily -Bring home blood pressure log to next visit -9-month follow-up

## 2020-06-10 NOTE — Assessment & Plan Note (Addendum)
Mr. Goeken states that since starting Seroquel daily at night, his visual hallucinations have subsided.  He denies any history of auditory hallucinations at any point.  Since starting Seroquel he has not noticed any new tremors or rigidity in his muscles.  Assessment/plan: As previously noted, differential for his hallucinations include Lewy body dementia versus undiagnosed schizophrenia versus alcohol induced dementia.  Given that he has had great success with Seroquel, will plan to continue for now.  He does not have any signs of parkinsonism or tar dive dyskinesia.  -Seroquel 25 mg before bedtime -8-month follow-up

## 2020-06-10 NOTE — Assessment & Plan Note (Signed)
Dan Henry states that he is been taking naltrexone daily and he feels it has helped his cravings significantly.  He states previously he is drinking at least 5-6 nights a week, however now he is only drinking 2 nights per week; additionally he has switched to nonalcoholic beer.  He denies any history of withdrawals recently.  Assessment/plan: -Refilled naltrexone -We will repeat CMP's for hepatic function monitoring while on naltrexone

## 2020-06-11 LAB — CMP14 + ANION GAP
ALT: 16 IU/L (ref 0–44)
AST: 28 IU/L (ref 0–40)
Albumin/Globulin Ratio: 1.5 (ref 1.2–2.2)
Albumin: 4.7 g/dL (ref 3.8–4.8)
Alkaline Phosphatase: 84 IU/L (ref 44–121)
Anion Gap: 16 mmol/L (ref 10.0–18.0)
BUN/Creatinine Ratio: 13 (ref 10–24)
BUN: 12 mg/dL (ref 8–27)
Bilirubin Total: 0.3 mg/dL (ref 0.0–1.2)
CO2: 23 mmol/L (ref 20–29)
Calcium: 9.4 mg/dL (ref 8.6–10.2)
Chloride: 103 mmol/L (ref 96–106)
Creatinine, Ser: 0.92 mg/dL (ref 0.76–1.27)
GFR calc Af Amer: 98 mL/min/{1.73_m2} (ref >59)
GFR calc non Af Amer: 85 mL/min/{1.73_m2} (ref >59)
Globulin, Total: 3.1 g/dL (ref 1.5–4.5)
Glucose: 89 mg/dL (ref 65–99)
Potassium: 4.4 mmol/L (ref 3.5–5.2)
Sodium: 142 mmol/L (ref 134–144)
Total Protein: 7.8 g/dL (ref 6.0–8.5)

## 2020-06-11 LAB — LIPID PANEL
Chol/HDL Ratio: 1.8 ratio (ref 0.0–5.0)
Cholesterol, Total: 169 mg/dL (ref 100–199)
HDL: 96 mg/dL (ref >39)
LDL Chol Calc (NIH): 55 mg/dL (ref 0–99)
Triglycerides: 104 mg/dL (ref 0–149)
VLDL Cholesterol Cal: 18 mg/dL (ref 5–40)

## 2020-06-12 NOTE — Progress Notes (Signed)
Internal Medicine Clinic Attending  Case discussed with Dr. Basaraba  At the time of the visit.  We reviewed the resident's history and exam and pertinent patient test results.  I agree with the assessment, diagnosis, and plan of care documented in the resident's note.  

## 2020-06-17 ENCOUNTER — Telehealth: Payer: Self-pay | Admitting: Behavioral Health

## 2020-06-17 NOTE — Telephone Encounter (Signed)
Contacted Pt's Niece [name] in regards to missed appt last week. Niece was unaware he missed appt w/Clinician as he related he had attended.  Niece estb'd Pt's need for extra support at this time due to one of his Str's dying over the wknd. He was close to this Str, just as he had been w/Niece's Mother who died 7 yrs ago. Niece originates Pt's problems w/ETOH to the loss of this Str.  Pt used to be a social drinker, but this has exxacerbated into serious health issues for him.  It poses difficulty for scheduling w/this Pt due to his lack of a phone connection. Niece will touch base w/him about scheduling again & see how he responds.  Dr. Theodis Shove

## 2020-06-18 ENCOUNTER — Ambulatory Visit: Payer: Medicare HMO | Admitting: Behavioral Health

## 2020-06-18 ENCOUNTER — Ambulatory Visit: Payer: Medicare HMO | Admitting: Pharmacist

## 2020-06-18 DIAGNOSIS — R441 Visual hallucinations: Secondary | ICD-10-CM

## 2020-06-18 DIAGNOSIS — F4321 Adjustment disorder with depressed mood: Secondary | ICD-10-CM

## 2020-06-18 DIAGNOSIS — F1021 Alcohol dependence, in remission: Secondary | ICD-10-CM

## 2020-06-18 NOTE — Progress Notes (Signed)
S/O: Patient arrives in good spirits.  Presents for medication management per Dr. Ronelle Nigh request regarding medication concerns discussed during patient appt.  Patient reports that he looked up the side effects of the naltrexone and remembers reading headache, nausea, and hallucinations. He denies any headache or nausea, however, has concerns regarding naltrexone and feels it is causing him to experience hallucinations. He mentions he has not discussed the hallucinations with any of the providers in the clinic. He states he started naltrexone about 3 months ago and when questioned on hallucinations, states they began before naltrexone initiation. He does not report concerns about any additional specific medications, however, reports he feels light-headed within minutes after taking his morning medications.  Currently, patient utilized pill organizer to help him remember his medications. He states he has no problems with adherence, however, further into appointment mentions that he is "sometimes rather forgetful."  Medication Adherence Questionnaire (A score of 2 or more points indicates risk for nonadherence)  Do you know what each of your medicines is for? 1 (1 point if no)  Do you ever have trouble remembering to take your medicine? 2 (2 points if yes)  Do you ever not take a medicine because you feel you do not need it?  0 (1 point if yes)  Do you think that any of your medicines is not helping you? 0 (1 point if yes)  Do you have any physical problems such as vision loss that keep you from taking your medicines as prescribed?  0 (2 points if yes)  Do you think any of your medicine is causing a side effect?  1 (1 point if yes)  Do you know the names of ALL of your medicines? 0 (1 point if no)  Do you think that you need ALL of your medicines? 0 (1 point if no)  In the past 6 months, have you missed getting a refill or a new prescription filled on time? 0 (1 point if yes)  How often do you  miss taking a dose of medicine?  2  Never (0 points), 1 or 2 times a month (0 points), 1 time a week (2 points), 2 or more times a week (3 points).   TOTAL SCORE 6/14    A/P: 1. Medication Adherence: Overall patient has good adherence based on score from questionnaire, however patient has known adherence challenges. Barriers include: lack of knowledge, forgetfulness, and concern for side effects. Discussed with patient purpose of each medication and answered all of patient's questions. At this time I do not believe naltrexone is the cause of the patient's hallucinations due to this being reported before initiation of medication as well as many previous discussions with providers in clinic. Also discussed with patient onset of action of medication and stated that if his medications were causing a side effect, such as feeling light-headed, he would likely not experience this within minutes of taking oral medication. At this time patient had no additional concerns regarding medication therapy.  2. Medication Reconciliation: medication list reviewed and updated. No issues were noted. Patient was provided with a printed medication list.   Total time in face to face counseling 30 minutes.   Follow up in PCP at next appropriately scheduled appt

## 2020-06-18 NOTE — BH Specialist Note (Signed)
Integrated Behavioral Health Initial In-Person Visit  MRN: [id number] Name: Dan Henry  Number of [place] Clinician visits:: 1/6 Session Start time: 1:00pm  Session End time: 1:40pm Total time: 40  minutes  Types of Service: Individual psychotherapy  Interpretor:No. Interpretor Name and Language: n/a    Warm Hand Off Completed.       Subjective: Dan Henry is a 70 y.o. male accompanied by self Patient was referred by Dr. Evette Doffing, [facility] for Recovery Support/Relapse Prevention, grieving issues. Patient reports the following symptoms/concerns: Elevated grief response to death of second Str this past Aug 12, 2022 Duration of problem: amost seven yrs since the death of his first Str (his Niece Dan Henry's Mom) in [place]; Severity of problem: mild to moderate grief rxn  Objective: Mood: Anxious and Depressed and Affect: Appropriate Risk of harm to self or others: No plan to harm self or others  Life Context: Family and Social: Pt comes from highly religious family growing up. He has lost several of his Siblings & is grieving over his Str who died last 52 in [place]. Pt has a Hx of heavy ETOH consumption per Niece's report. 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 Pt has Hx of chest pain upon exertion. Life Changes: recent death of Str has shaken Pt-he tends to isolate in his grief  Patient and/or Family's Strengths/Protective Factors: Social connections, Concrete supports in place (healthy food, safe environments, etc.) and Physical Health (exercise, healthy diet, medication compliance, etc.). Pt has good neighbors he can call on for transportation.  Goals Addressed: Patient will: 1. Reduce symptoms of: anxiety, depression and do grief work as needed & necessary  for him. 2. Increase knowledge and/or ability of: coping skills, healthy habits and psychoedu about grief & loss  3. Demonstrate ability to: Increase healthy adjustment to current life circumstances, Improve medication compliance and inc knowledge of his medications w/Dan Henry, PharmD  Progress towards Goals: Estb'd today: Pt will attend 2-3 sessions of therapy to address his current mental health needs  Interventions: Interventions utilized: Psychoeducation and/or Health Education and Intake/Assessment  Standardized Assessments completed: Not Needed  Patient and/or Family Response: Pt presents in f:f format today after [facility] for 2 other scheduled times. Welcomed Pt's presence & explained psychotherapy process & how it can assist him.   Patient Centered Plan: Patient is on the following Treatment Plan(s):  Attend 2-3 sessions to address current needs, estb w/Clinician & have future resource for therapy.  Assessment: Patient currently experiencing heightened grief to older Str's recent death. Pt also concerned for medication admin & wanting to understand Henry what he is taking. Pt brought med bottles: Seroquel 25mg  at night, Naltrexone 25mg  & Lopressor 25mg .   Patient may benefit from Referral & visit w/Dr. Hughes Henry, PharmD today for further instructions/edu about prescriptions. Cont'd therapy sessions to address mental health needs & use of ETOH assessment.  Plan: 1. Follow up with behavioral health clinician on : 3 wks for f:f or telehealth visit for 30 min 2. Behavioral recommendations: Cont to reduce use of ETOH per Pt report, think about goals for therapy 3. Referral(s): Dr. Hughes Henry, PharmD 4. "From scale of 1-10, how likely are you to follow plan?": [place], LMFT

## 2020-06-29 ENCOUNTER — Ambulatory Visit: Payer: Medicare HMO | Admitting: Internal Medicine

## 2020-07-09 ENCOUNTER — Telehealth: Payer: Self-pay | Admitting: *Deleted

## 2020-07-09 ENCOUNTER — Ambulatory Visit: Payer: Medicare HMO | Admitting: Behavioral Health

## 2020-07-09 ENCOUNTER — Other Ambulatory Visit: Payer: Self-pay

## 2020-07-09 DIAGNOSIS — F1021 Alcohol dependence, in remission: Secondary | ICD-10-CM

## 2020-07-09 NOTE — Telephone Encounter (Signed)
I called and spoke to the patient.  He states he had an episode of chest pain with walking earlier today, which was relieved with nitroglycerin.  Denies any additional chest pain episodes after that.  Per chart review, patient has stable angina and has had a stress test.  His last visit with cardiology was November 2021.  Advised patient to take 1 tablet of sublingual nitroglycerin when he has an episode of chest pain.  If the chest pain does not resolved within 5 minutes, take the second tablet and call EMS.  Patient verbalizes understanding.

## 2020-07-09 NOTE — BH Specialist Note (Signed)
Integrated Behavioral Health via Telemedicine Visit  07/09/2020 Dan Henry [id number]  Number of [place] visits: 2/6 Session Start time: 1:00pm  Session End time: 1:30pm Total time: 30  Referring Provider: Dr. Nathanial Rancher, MD Patient/Family location: Pt at home in private [facility] Provider location: [facility] Office All persons participating in visit: Pt & Clinician Types of Service: Individual psychotherapy  I connected with [name] and/or [name] self via  Telephone or Video Enabled Telemedicine Application  (Video is Caregility application) and verified that I am speaking with the correct person using two identifiers. Discussed confidentiality: Yes   I discussed the limitations of telemedicine and the availability of in person appointments.  Discussed there is a possibility of technology failure and discussed alternative modes of communication if that failure occurs.  I discussed that engaging in this telemedicine visit, they consent to the provision of behavioral healthcare and the services will be billed under their insurance.  Patient and/or legal guardian expressed understanding and consented to Telemedicine visit: Yes   Presenting Concerns: Patient and/or family reports the following symptoms/concerns: reduction in visual hallucinations by 100% Duration of problem: 1-2 days; Severity of problem: severe  Patient and/or Family's Strengths/Protective Factors: Social connections, Concrete supports in place (healthy food, safe environments, etc.) and Physical Health (exercise, healthy diet, medication compliance, etc.)  Goals Addressed: Patient will: 1.  Reduce symptoms of: anxiety and depression  2.  Increase knowledge and/or ability of: coping skills, healthy habits, self-management skills and stress reduction  3.  Demonstrate ability to: Increase healthy adjustment to current life circumstances  Progress towards  Goals: Ongoing  Interventions: Interventions utilized:  Supportive Counseling Standardized Assessments completed: Not Needed  Patient and/or Family Response: Pt receptive to call today & feeling improved.  Assessment: Patient currently experiencing Pt visual hallucinations are 100% diminished. He is doing well besides an intermittent heart pain he exp'd on a walk on Wed. This pain occurred upon exertion. Pt stopped his walk & rested for 5 min, resuming w/no apparent residual pain. Pt reports he has also cessated ETOH successfully at this time.   Patient may benefit from telehealth call from [place], [place]. Pt responds well to encouragement.  Plan: 1. Follow up with behavioral health clinician on : a few wks on telehealth for 30 min check-in 2. Behavioral recommendations: Cont this great progress & wait for RN's call 3. Referral(s): [place] (In Clinic)  I discussed the assessment and treatment plan with the patient and/or parent/guardian. They were provided an opportunity to ask questions and all were answered. They agreed with the plan and demonstrated an understanding of the instructions.   They were advised to call back or seek an in-person evaluation if the symptoms worsen or if the condition fails to improve as anticipated.  Donnetta Hutching, LMFT

## 2020-07-09 NOTE — Telephone Encounter (Signed)
I was asked by Dr Theodis Shove, who had telehealth appt, to call pt about him c/o pain in his chest when walking.  I called pt currently not c/o any discomfort. He stated when he goes for a walk, he gets "short-winded" and has pain in his chest. I asked if he takes NTG, he stated "yes, it helps" and the pain goes away until the next time he goes for a walk. Please advise.

## 2020-07-16 ENCOUNTER — Emergency Department (HOSPITAL_COMMUNITY)
Admission: EM | Admit: 2020-07-16 | Discharge: 2020-07-16 | Disposition: A | Discharge status: Home / Self Care | Payer: Medicare HMO | Attending: Emergency Medicine | Admitting: Emergency Medicine

## 2020-07-16 ENCOUNTER — Encounter (HOSPITAL_COMMUNITY): Payer: Self-pay

## 2020-07-16 DIAGNOSIS — Z79899 Other long term (current) drug therapy: Secondary | ICD-10-CM | POA: Diagnosis not present

## 2020-07-16 DIAGNOSIS — I10 Essential (primary) hypertension: Secondary | ICD-10-CM | POA: Insufficient documentation

## 2020-07-16 DIAGNOSIS — R040 Epistaxis: Secondary | ICD-10-CM | POA: Insufficient documentation

## 2020-07-16 DIAGNOSIS — R000 Tachycardia, unspecified: Secondary | ICD-10-CM | POA: Diagnosis not present

## 2020-07-16 LAB — COMPREHENSIVE METABOLIC PANEL
ALT: 26 U/L (ref 0–44)
AST: 48 U/L — ABNORMAL HIGH (ref 15–41)
Albumin: 3.7 g/dL (ref 3.5–5.0)
Alkaline Phosphatase: 85 U/L (ref 38–126)
Anion gap: 12 (ref 5–15)
BUN: 16 mg/dL (ref 8–23)
CO2: 20 mmol/L — ABNORMAL LOW (ref 22–32)
Calcium: 8.6 mg/dL — ABNORMAL LOW (ref 8.9–10.3)
Chloride: 108 mmol/L (ref 98–111)
Creatinine, Ser: 1.06 mg/dL (ref 0.61–1.24)
GFR, Estimated: >60 mL/min (ref >60)
Glucose, Bld: 108 mg/dL — ABNORMAL HIGH (ref 70–99)
Potassium: 3.5 mmol/L (ref 3.5–5.1)
Sodium: 140 mmol/L (ref 135–145)
Total Bilirubin: 0.7 mg/dL (ref 0.3–1.2)
Total Protein: 7.1 g/dL (ref 6.5–8.1)

## 2020-07-16 LAB — CBC
HCT: 37 % — ABNORMAL LOW (ref 39.0–52.0)
Hemoglobin: 12 g/dL — ABNORMAL LOW (ref 13.0–17.0)
MCH: 28.2 pg (ref 26.0–34.0)
MCHC: 32.4 g/dL (ref 30.0–36.0)
MCV: 87.1 fL (ref 80.0–100.0)
Platelets: 175 10*3/uL (ref 150–400)
RBC: 4.25 MIL/uL (ref 4.22–5.81)
RDW: 15.6 % — ABNORMAL HIGH (ref 11.5–15.5)
WBC: 5.6 10*3/uL (ref 4.0–10.5)
nRBC: 0 % (ref 0.0–0.2)

## 2020-07-16 MED ORDER — SILVER NITRATE-POT NITRATE 75-25 % EX MISC
1.0000 | Freq: Once | CUTANEOUS | Status: AC
  Administered 2020-07-16: 1 via TOPICAL
  Filled 2020-07-16: qty 1

## 2020-07-16 MED ORDER — LIDOCAINE HCL 4 % EX SOLN
Freq: Once | CUTANEOUS | Status: AC
  Filled 2020-07-16: qty 50

## 2020-07-16 NOTE — ED Triage Notes (Signed)
Nose bleed x 2 today, each lasting 30-40 minutes, initial BP 230/130 and last BP 200/100, HR 100, CBG 125, 02 sat 95% RA, RR 16, Medic gave Afrin 2 sprays both nostrils. Patient A/A/O.

## 2020-07-16 NOTE — ED Provider Notes (Signed)
[facility] EMERGENCY DEPARTMENT Provider Note   CSN: [id number] Arrival date & time: 07/16/20  1903     History Chief Complaint  Patient presents with  . Epistaxis    Dan Henry is a 70 y.o. male.  HPI Patient presents with nosebleed.  Mostly coming on the right nostril but states there was some on the left.  Also had blood going down the back of his throat.  Has had bleeding twice today.  States it is stopped on its own.  Does have a history of nosebleeds.  Not on anticoagulation.  Does have a history of alcohol use.  Had been heavy at times but states he only drinks a beer or 2 at a time now.  States he tries to drink nonalcoholic beer.  No trauma.  The chest pain or trouble breathing.  Did not feel lightheaded.    Past Medical History:  Diagnosis Date  . Alcohol intoxication ([place]) 11/18/2019  . Alcohol withdrawal ([place]) 04/23/2019  . Alcohol withdrawal delirium ([place]) 02/14/2019  . Hypertension   . Hypokalemia 07/19/2017  . Lactic acidosis 02/20/2018  . Malnutrition of moderate degree 10/24/2018  . Nosebleed 04/23/2019  . Rhabdomyolysis 10/26/2018    Patient Active Problem List   Diagnosis Date Noted  . Cognitive dysfunction 06/10/2020  . Iron deficiency anemia 11/22/2019  . Syncope 11/18/2019  . Stable angina ([place]) 05/08/2019  . Anemia 02/26/2019  . Urinary retention 10/25/2018  . Malnutrition of moderate degree 10/24/2018  . Alcohol use disorder, severe, in early remission ([place]) 10/23/2018  . Hepatic hemangioma 10/19/2018  . Visual hallucinations 06/26/2018  . Abnormal TSH 06/26/2018  . Need for hepatitis B vaccination 02/21/2018  . Unintentional weight loss 02/16/2018  . Benign essential tremor 07/19/2017  . Elevated LFTs 05/16/2017  . ASCVD (arteriosclerotic cardiovascular disease) 05/16/2017  . Acute kidney injury ([place]) 05/08/2017  . Right carotid bruit 05/08/2017  . Essential hypertension 05/21/2015  . Healthcare maintenance 05/21/2015     Past Surgical History:  Procedure Laterality Date  . NO PAST SURGERIES         Family History  Problem Relation Age of Onset  . Hypertension Mother   . Hypertension Father   . Hypertension Sister   . Hypertension Sister   . Stroke Sister   . Cancer Brother        throat    Social History   Tobacco Use  . Smoking status: Never Smoker  . Smokeless tobacco: Never Used  Vaping Use  . Vaping Use: Never used  Substance Use Topics  . Alcohol use: Not Currently    Alcohol/week: 4.0 standard drinks    Types: 4 Standard drinks or equivalent per week    Comment: occ  . Drug use: No    Home Medications Prior to Admission medications   Medication Sig Start Date End Date Taking? Authorizing Provider  amLODipine (NORVASC) 10 MG tablet Take 1 tablet (10 mg total) by mouth daily. 03/10/20   Jose Persia, MD  feeding supplement (ENSURE ENLIVE / ENSURE PLUS) LIQD Take 237 mLs by mouth 3 (three) times daily between meals. 02/13/20   Cato Mulligan, MD  folic acid (FOLVITE) 1 MG tablet Take 1 tablet (1 mg total) by mouth daily. 02/20/20   Harvie Heck, MD  lisinopril (ZESTRIL) 10 MG tablet Take 1 tablet (10 mg total) by mouth daily. 06/10/20 12/07/20  Jose Persia, MD  metoprolol tartrate (LOPRESSOR) 25 MG tablet Take 1 tablet (25 mg total) by mouth 2 (two)  times daily. 06/10/20 12/07/20  Jose Persia, MD  Multiple Vitamin (MULTIVITAMIN WITH MINERALS) TABS tablet Take 1 tablet by mouth every morning.    [provider]  naltrexone (DEPADE) 50 MG tablet Take 1 tablet (50 mg total) by mouth daily. 06/10/20   Jose Persia, MD  nitroGLYCERIN (NITROSTAT) 0.4 MG SL tablet Place 1 tablet (0.4 mg total) under the tongue every 5 (five) minutes as needed for chest pain. 06/10/20   Jose Persia, MD  QUEtiapine (SEROQUEL) 25 MG tablet Take 1 tablet (25 mg total) by mouth at bedtime. 06/10/20   Jose Persia, MD  thiamine 100 MG tablet Take 1 tablet (100 mg total) by mouth daily. 06/10/20    Jose Persia, MD    Allergies    Patient has no known allergies.  Review of Systems   Review of Systems  Constitutional: Negative for appetite change.  HENT: Positive for nosebleeds.   Respiratory: Negative for shortness of breath.   Cardiovascular: Negative for chest pain.  Gastrointestinal: Negative for abdominal pain.  Genitourinary: Negative for flank pain.  Musculoskeletal: Negative for back pain.  Skin: Negative for wound.  Neurological: Negative for weakness.  Hematological: Does not bruise/bleed easily.    Physical Exam Updated Vital Signs BP (!) 165/99 (BP Location: Right Arm)   Pulse 100   Temp 98.2 F (36.8 C) (Oral)   Resp 20   SpO2 97%   Physical Exam Vitals and nursing note reviewed.  HENT:     Head: Atraumatic.     Nose:     Comments: Blood in right nostril.  Less in left nostril.  Some blood in posterior pharynx. Eyes:     Pupils: Pupils are equal, round, and reactive to light.  Cardiovascular:     Rate and Rhythm: Regular rhythm.  Pulmonary:     Breath sounds: No wheezing, rhonchi or rales.  Abdominal:     Tenderness: There is no abdominal tenderness.  Musculoskeletal:        General: No tenderness.  Skin:    General: Skin is warm.     Capillary Refill: Capillary refill takes less than 2 seconds.  Neurological:     Mental Status: He is alert and oriented to person, place, and time.     ED Results / Procedures / Treatments   Labs (all labs ordered are listed, but only abnormal results are displayed) Labs Reviewed  COMPREHENSIVE METABOLIC PANEL - Abnormal; Notable for the following components:      Result Value   CO2 20 (*)    Glucose, Bld 108 (*)    Calcium 8.6 (*)    AST 48 (*)    All other components within normal limits  CBC - Abnormal; Notable for the following components:   Hemoglobin 12.0 (*)    HCT 37.0 (*)    RDW 15.6 (*)    All other components within normal limits    EKG None  Radiology No results  found.  Procedures .Epistaxis Management  Date/Time: 07/16/2020 11:20 PM Performed by: Davonna Belling, MD Authorized by: Davonna Belling, MD   Consent:    Consent obtained:  Verbal   Consent given by:  Patient   Risks, benefits, and alternatives were discussed: yes     Risks discussed:  Bleeding, infection, nasal injury and pain   Alternatives discussed:  No treatment, delayed treatment, alternative treatment, observation and referral Universal protocol:    Procedure explained and questions answered to patient or proxy's satisfaction: yes   Anesthesia:  Anesthesia method:  Topical application   Topical anesthesia: 4% lidocaine. Procedure details:    Treatment site:  R anterior   Treatment method:  Silver nitrate   Treatment complexity:  Limited   Treatment episode: recurring   Post-procedure details:    Assessment:  Bleeding stopped   Procedure completion:  Tolerated well, no immediate complications     Medications Ordered in ED Medications  silver nitrate applicators applicator 1 Stick (1 Stick Topical Given 07/16/20 2030)  lidocaine (XYLOCAINE) 4 % external solution ( Topical Given 07/16/20 2205)    ED Course  I have reviewed the triage vital signs and the nursing notes.  Pertinent labs & imaging results that were available during my care of the patient were reviewed by me and considered in my medical decision making (see chart for details).    MDM Rules/Calculators/A&P                          Patient with epistaxis.  Right-sided.  Lab work reassuring.  Does not have severe anemia or thrombocytopenia.  Done due to alcohol intake.  Bleeding controlled with silver nitrate.  PCP follow-up and potential ENT if symptoms do not improve. Final Clinical Impression(s) / ED Diagnoses Final diagnoses:  Anterior epistaxis    Rx / DC Orders ED Discharge Orders    None       Davonna Belling, MD 07/16/20 2321

## 2020-07-16 NOTE — Discharge Instructions (Addendum)
Follow-up with your doctor as needed.  If nosebleeds continue they may need to refer you to ENT

## 2020-07-22 ENCOUNTER — Other Ambulatory Visit: Payer: Self-pay

## 2020-07-22 ENCOUNTER — Encounter: Payer: Self-pay | Admitting: Student

## 2020-07-22 ENCOUNTER — Ambulatory Visit (INDEPENDENT_AMBULATORY_CARE_PROVIDER_SITE_OTHER): Payer: Medicaid Other | Admitting: Student

## 2020-07-22 DIAGNOSIS — I10 Essential (primary) hypertension: Secondary | ICD-10-CM

## 2020-07-22 DIAGNOSIS — F1021 Alcohol dependence, in remission: Secondary | ICD-10-CM | POA: Diagnosis not present

## 2020-07-22 NOTE — Patient Instructions (Signed)
Dan Henry,  It is a pleasure seeing you in the clinic today.  It appears like you are going through an episode of alcohol withdrawal.  We understand that you do not want to be admitted to the hospital.  Please come to the emergency room immediately if your symptoms worsen.  Please come back to the clinic in 1-2 week for reassessment of your blood pressure.  Take care,  Dr. Alfonse Spruce

## 2020-07-22 NOTE — Progress Notes (Signed)
   CC: Alcohol withdrawal  HPI:  Dan Henry is a 70 y.o. male with past medical history of hypertension, alcohol use disorder with DT in 2020 and multiple hospitalization for alcohol withdrawal, who presented to the clinic for blood pressure check, found to be in alcohol withdrawal.  Please see problem based charting for further details.  Past Medical History:  Diagnosis Date  . Alcohol intoxication ([place]) 11/18/2019  . Alcohol withdrawal ([place]) 04/23/2019  . Alcohol withdrawal delirium ([place]) 02/14/2019  . Hypertension   . Hypokalemia 07/19/2017  . Lactic acidosis 02/20/2018  . Malnutrition of moderate degree 10/24/2018  . Nosebleed 04/23/2019  . Rhabdomyolysis 10/26/2018   Review of Systems:  Review of Systems  Constitutional: Positive for diaphoresis.  Gastrointestinal: Negative for nausea and vomiting.  Neurological: Positive for tremors.  Psychiatric/Behavioral: Positive for hallucinations. The patient is nervous/anxious.     Physical Exam:  Vitals:   07/22/20 1451 07/22/20 1516  BP: (!) 148/136 (!) 161/99  Pulse: (!) 127 (!) 106  Temp: 98.4 F (36.9 C)   TempSrc: Oral   SpO2: 99%   Weight: 127 lb (57.6 kg)   Height: 5\' 4"  (1.626 m)    Physical Exam Constitutional:      General: He is not in acute distress.    Appearance: He is diaphoretic.     Comments: Patient appears anxious, tremulous with skin flushing.  Eyes:     General:        Right eye: No discharge.        Left eye: No discharge.  Cardiovascular:     Rate and Rhythm: Tachycardia present.  Pulmonary:     Effort: Pulmonary effort is normal. No respiratory distress.  Musculoskeletal:     Comments: Tremors with outstretched arms  Skin:    General: Skin is warm.  Neurological:     Mental Status: He is alert.  Psychiatric:        Mood and Affect: Mood normal.     Assessment & Plan:   See Encounters Tab for problem based charting.  Patient discussed with Dr. Evette Doffing

## 2020-07-22 NOTE — Assessment & Plan Note (Addendum)
Patient presents today for blood pressure check and found to be in alcohol withdrawal.  He states that he has felt tremulous and anxious since yesterday.  Also reports skin flushing.  States that he only drinks 1 beer every other day and his last drink was yesterday.  States that he still taking the naltrexone.  He also endorses visual hallucination that he saw people in his living room and also in his room.  States that is very disturbing for him.  States that has been going on for 2 weeks.  His symptoms initially improved with Seroquel but has gotten progressively worse.  Patient has not seen a psychiatrist.  He denies auditory hallucination.  Patient denies any drug use.  Reports compliant with his medications.  Assessment and plan His HPI and physical exam consistent with an episode of alcohol withdrawal.  Patient is high risk for developing complications of alcohol withdrawal.  He has had DT in 2020 and was hospitalized multiple times for alcohol withdrawals.  His CIWA score was 23.  Plan was to admit him to the hospital for close monitor to prevent further complications.  I explained to patient that he is high risk for developing complications due to past DVT.  Patient however declined and wished to go home.  Patient lives alone but his niece often comes by to help.  I emphasized to patient that he needs to go to the ED or call 911 if his symptoms get worse.  Patient verbalizes understanding.  -Follow-up in 1 week -Continue Seroquel for visual hallucination

## 2020-07-22 NOTE — Assessment & Plan Note (Signed)
Elevated blood pressure likely due to alcohol withdrawal.  Advised patient to come back in 1 week to reassess his blood pressure.  -Continue current regimen

## 2020-07-23 NOTE — Progress Notes (Signed)
Internal Medicine Clinic Attending  I saw and evaluated the patient.  I personally confirmed the key portions of the history and exam documented by Dr. Alfonse Spruce and I reviewed pertinent patient test results.  The assessment, diagnosis, and plan were formulated together and I agree with the documentation in the resident's note.   Person with AUD and history of complicated withdrawal, here with some symptoms of delusions, tremors, and tachycardia. It is always hard to tell if this person is in withdrawal, or if his symptoms are from his underlying psychiatric disorder. We offered him admission today for monitored withdrawal, discussed the risks of DT at home, he declined admission and prefers to manage this at home. We will follow up with him closely, continue seroquel but may increase dose at next visit if we think he is through the withdrawal timeframe.

## 2020-07-30 ENCOUNTER — Ambulatory Visit: Payer: Medicare HMO | Admitting: Behavioral Health

## 2020-08-10 ENCOUNTER — Ambulatory Visit (INDEPENDENT_AMBULATORY_CARE_PROVIDER_SITE_OTHER): Payer: Medicare HMO | Admitting: Internal Medicine

## 2020-08-10 ENCOUNTER — Other Ambulatory Visit: Payer: Self-pay

## 2020-08-10 ENCOUNTER — Encounter: Payer: Self-pay | Admitting: Internal Medicine

## 2020-08-10 VITALS — BP 139/71 | HR 85 | Temp 98.4°F | Ht 64.0 in | Wt 133.6 lb

## 2020-08-10 DIAGNOSIS — I10 Essential (primary) hypertension: Secondary | ICD-10-CM | POA: Diagnosis not present

## 2020-08-10 DIAGNOSIS — F1021 Alcohol dependence, in remission: Secondary | ICD-10-CM | POA: Diagnosis not present

## 2020-08-10 DIAGNOSIS — Z1211 Encounter for screening for malignant neoplasm of colon: Secondary | ICD-10-CM | POA: Diagnosis not present

## 2020-08-10 DIAGNOSIS — R441 Visual hallucinations: Secondary | ICD-10-CM

## 2020-08-10 DIAGNOSIS — Z23 Encounter for immunization: Secondary | ICD-10-CM | POA: Diagnosis not present

## 2020-08-10 MED ORDER — OLMESARTAN-AMLODIPINE-HCTZ 40-10-12.5 MG PO TABS: 1.0000 | ORAL_TABLET | Freq: Every day | ORAL | 3 refills | Status: DC

## 2020-08-10 NOTE — Assessment & Plan Note (Signed)
Agree to collect stool sample for FIT testing

## 2020-08-10 NOTE — Assessment & Plan Note (Signed)
Dan Henry is a 70 yo M w/ PMH of alcohol abuse, htn presenting to [facility] for f/u management of his chronic conditions. He mentions that he has been feeling well. Denies any significant withdrawal sxs. Noted to appear to be in active withdrawal on last clinic visit, but he mentions having no recurrence of hallucinations since restarting his seroquel. Mentions continuing to drink beer every 3-4 days. Denies tremors.  A/P No withdrawal sxs on exam today. Tolerating naltrexone well.  - C/w naltrexone 50mg  daily

## 2020-08-10 NOTE — Assessment & Plan Note (Signed)
Agree to receive pneumovar this visit

## 2020-08-10 NOTE — Assessment & Plan Note (Signed)
Denies any additional visual / auditory / tactile hallucinations. Initially thought to be due to alcohol withdrawals but appear to persist even when not in withdrawal. Possibly due to missed diagnosis of schizophrenia vs Lewy body dementia.  A/P C/w seroquel 25mg  qhs

## 2020-08-10 NOTE — Patient Instructions (Addendum)
Thank you for allowing [place] to provide your care today. Today we discussed your blood pressure    I have ordered no labs for you. I will call if any are abnormal.    Today we made the following changes to your medications.    Please start amlodipine-hctz-valsartan 40-10-12.5mg  daily  Please follow-up in 4 weeks.    Should you have any questions or concerns please call the internal medicine clinic at [phone].     [email or web address]  DASH Eating Plan DASH stands for Dietary Approaches to Stop Hypertension. The DASH eating plan is a healthy eating plan that has been shown to:  Reduce high blood pressure (hypertension).  Reduce your risk for type 2 diabetes, heart disease, and stroke.  Help with weight loss. What are tips for following this plan? Reading food labels  Check food labels for the amount of salt (sodium) per serving. Choose foods with less than 5 percent of the Daily Value of sodium. Generally, foods with less than 300 milligrams (mg) of sodium per serving fit into this eating plan.  To find whole grains, look for the word "whole" as the first word in the ingredient list. Shopping  Buy products labeled as "low-sodium" or "no salt added."  Buy fresh foods. Avoid canned foods and pre-made or frozen meals. Cooking  Avoid adding salt when cooking. Use salt-free seasonings or herbs instead of table salt or sea salt. Check with your health care provider or pharmacist before using salt substitutes.  Do not fry foods. Cook foods using healthy methods such as baking, boiling, grilling, roasting, and broiling instead.  Cook with heart-healthy oils, such as olive, canola, avocado, soybean, or sunflower oil. Meal planning  Eat a balanced diet that includes: ? 4 or more servings of fruits and 4 or more servings of vegetables each day. Try to fill one-half of your plate with fruits and vegetables. ? 6-8 servings of whole grains  each day. ? Less than 6 oz (170 g) of lean meat, poultry, or fish each day. A 3-oz (85-g) serving of meat is about the same size as a deck of cards. One egg equals 1 oz (28 g). ? 2-3 servings of low-fat dairy each day. One serving is 1 cup (237 mL). ? 1 serving of nuts, seeds, or beans 5 times each week. ? 2-3 servings of heart-healthy fats. Healthy fats called omega-3 fatty acids are found in foods such as walnuts, flaxseeds, fortified milks, and eggs. These fats are also found in cold-water fish, such as sardines, salmon, and mackerel.  Limit how much you eat of: ? Canned or prepackaged foods. ? Food that is high in trans fat, such as some fried foods. ? Food that is high in saturated fat, such as fatty meat. ? Desserts and other sweets, sugary drinks, and other foods with added sugar. ? Full-fat dairy products.  Do not salt foods before eating.  Do not eat more than 4 egg yolks a week.  Try to eat at least 2 vegetarian meals a week.  Eat more home-cooked food and less restaurant, buffet, and fast food.   Lifestyle  When eating at a restaurant, ask that your food be prepared with less salt or no salt, if possible.  If you drink alcohol: ? Limit how much you use to:  0-1 drink a day for women who are not pregnant.  0-2 drinks a day for men. ? Be aware of how much alcohol is in your drink. In the [place] one drink  equals one 12 oz bottle of beer (355 mL), one 5 oz glass of wine (148 mL), or one 1 oz glass of hard liquor (44 mL). General information  Avoid eating more than 2,300 mg of salt a day. If you have hypertension, you may need to reduce your sodium intake to 1,500 mg a day.  Work with your health care provider to maintain a healthy body weight or to lose weight. Ask what an ideal weight is for you.  Get at least 30 minutes of exercise that causes your heart to beat faster (aerobic exercise) most days of the week. Activities may include walking, swimming, or biking.  Work  with your health care provider or dietitian to adjust your eating plan to your individual calorie needs. What foods should I eat? Fruits All fresh, dried, or frozen fruit. Canned fruit in natural juice (without added sugar). Vegetables Fresh or frozen vegetables (raw, steamed, roasted, or grilled). Low-sodium or reduced-sodium tomato and vegetable juice. Low-sodium or reduced-sodium tomato sauce and tomato paste. Low-sodium or reduced-sodium canned vegetables. Grains Whole-grain or whole-wheat bread. Whole-grain or whole-wheat pasta. Brown rice. Dan Henry. Bulgur. Whole-grain and low-sodium cereals. Pita bread. Low-fat, low-sodium crackers. Whole-wheat flour tortillas. Meats and other proteins Skinless chicken or [place]. Ground chicken or [place]. Pork with fat trimmed off. Fish and seafood. Egg whites. Dried beans, peas, or lentils. Unsalted nuts, nut butters, and seeds. Unsalted canned beans. Lean cuts of beef with fat trimmed off. Low-sodium, lean precooked or cured meat, such as sausages or meat loaves. Dairy Low-fat (1%) or fat-free (skim) milk. Reduced-fat, low-fat, or fat-free cheeses. Nonfat, low-sodium ricotta or cottage cheese. Low-fat or nonfat yogurt. Low-fat, low-sodium cheese. Fats and oils Soft margarine without trans fats. Vegetable oil. Reduced-fat, low-fat, or light mayonnaise and salad dressings (reduced-sodium). Canola, safflower, olive, avocado, soybean, and sunflower oils. Avocado. Seasonings and condiments Herbs. Spices. Seasoning mixes without salt. Other foods Unsalted popcorn and pretzels. Fat-free sweets. The items listed above may not be a complete list of foods and beverages you can eat. Contact a dietitian for more information. What foods should I avoid? Fruits Canned fruit in a light or heavy syrup. Fried fruit. Fruit in cream or butter sauce. Vegetables Creamed or fried vegetables. Vegetables in a cheese sauce. Regular canned vegetables (not low-sodium or  reduced-sodium). Regular canned tomato sauce and paste (not low-sodium or reduced-sodium). Regular tomato and vegetable juice (not low-sodium or reduced-sodium). Dan Henry. Olives. Grains Baked goods made with fat, such as croissants, muffins, or some breads. Dry pasta or rice meal packs. Meats and other proteins Fatty cuts of meat. Ribs. Fried meat. Berniece Salines. Bologna, salami, and other precooked or cured meats, such as sausages or meat loaves. Fat from the back of a pig (fatback). Bratwurst. Salted nuts and seeds. Canned beans with added salt. Canned or smoked fish. Whole eggs or egg yolks. Chicken or [place] with skin. Dairy Whole or 2% milk, cream, and half-and-half. Whole or full-fat cream cheese. Whole-fat or sweetened yogurt. Full-fat cheese. Nondairy creamers. Whipped toppings. Processed cheese and cheese spreads. Fats and oils Butter. Stick margarine. Lard. Shortening. Ghee. Bacon fat. Tropical oils, such as coconut, palm kernel, or palm oil. Seasonings and condiments Onion salt, garlic salt, seasoned salt, table salt, and sea salt. Worcestershire sauce. Tartar sauce. Barbecue sauce. Teriyaki sauce. Soy sauce, including reduced-sodium. Steak sauce. Canned and packaged gravies. Fish sauce. Oyster sauce. Cocktail sauce. Store-bought horseradish. Ketchup. Mustard. Meat flavorings and tenderizers. Bouillon cubes. Hot sauces. Pre-made or packaged marinades. Pre-made or packaged  taco seasonings. Relishes. Regular salad dressings. Other foods Salted popcorn and pretzels. The items listed above may not be a complete list of foods and beverages you should avoid. Contact a dietitian for more information. Where to find more information  National Heart, Lung, and Blood Institute: https://wilson-eaton.com/  American Heart Association: www.heart.org  Academy of Nutrition and Dietetics: www.eatright.[place]: www.kidney.org Summary  The DASH eating plan is a healthy eating plan that has  been shown to reduce high blood pressure (hypertension). It may also reduce your risk for type 2 diabetes, heart disease, and stroke.  When on the DASH eating plan, aim to eat more fresh fruits and vegetables, whole grains, lean proteins, low-fat dairy, and heart-healthy fats.  With the DASH eating plan, you should limit salt (sodium) intake to 2,300 mg a day. If you have hypertension, you may need to reduce your sodium intake to 1,500 mg a day.  Work with your health care provider or dietitian to adjust your eating plan to your individual calorie needs. This information is not intended to replace advice given to you by your health care provider. Make sure you discuss any questions you have with your health care provider. Document Revised: 03/22/2019 Document Reviewed: 03/22/2019 Elsevier Patient Education  2021 [employer].

## 2020-08-10 NOTE — Assessment & Plan Note (Signed)
BP Readings from Last 3 Encounters:  08/10/20 139/71  07/22/20 (!) 138/108  07/16/20 (!) 165/99   Present with persistent hypertension. Previously thought to be due to medication non-adherence. In prior admission, bp well controlled on regimen of amlodipine, lisinopirl, metoprolol. Mentions that he has been taking his meds everday as prescribed. Denies any light-headedness, blurry vision, chest pain, palpitations, rash, or cough.  A/P Present with elevated bp. Appear lucid and adherent to current regimen. Bp elevated possibly due to sodium intake. Will change to combination pill for better adherence. - Start amlodipine-olmesartan-hctz 40-10-12.5mg  daily - F/u in 4 weeks for lab check

## 2020-08-10 NOTE — Progress Notes (Signed)
   CC: High blood pressure  HPI: Dan Henry is a 70 y.o. with PMH listed below presenting with complaint of high blood pressure. Please see problem based assessment and plan for further details.  Past Medical History:  Diagnosis Date  . Alcohol intoxication ([place]) 11/18/2019  . Alcohol withdrawal ([place]) 04/23/2019  . Alcohol withdrawal delirium ([place]) 02/14/2019  . Hypertension   . Hypokalemia 07/19/2017  . Lactic acidosis 02/20/2018  . Malnutrition of moderate degree 10/24/2018  . Nosebleed 04/23/2019  . Rhabdomyolysis 10/26/2018   Review of Systems: Review of Systems  Constitutional: Negative for chills, fever and malaise/fatigue.  Eyes: Negative for blurred vision.  Respiratory: Negative for shortness of breath.   Cardiovascular: Negative for chest pain, palpitations and leg swelling.  Gastrointestinal: Negative for constipation, diarrhea, nausea and vomiting.  Neurological: Negative for dizziness.  All other systems reviewed and are negative.   Physical Exam: Vitals:   08/10/20 1316  BP: 139/71  Pulse: 85  Temp: 98.4 F (36.9 C)  TempSrc: Oral  SpO2: 98%  Weight: 133 lb 9.6 oz (60.6 kg)  Height: 5\' 4"  (1.626 m)   Gen: Well-developed, well nourished, NAD HEENT: NCAT head, hearing intact CV: RRR, S1, S2 normal Pulm: CTAB, No rales, no wheezes Extm: ROM intact, Peripheral pulses intact, No peripheral edema Skin: Dry, Warm, normal turgor Neuro: AAOx3, no tremors Psych: Appropriate mood and affect  Assessment & Plan:   Essential hypertension BP Readings from Last 3 Encounters:  08/10/20 139/71  07/22/20 (!) 138/108  07/16/20 (!) 165/99   Present with persistent hypertension. Previously thought to be due to medication non-adherence. In prior admission, bp well controlled on regimen of amlodipine, lisinopirl, metoprolol. Mentions that he has been taking his meds everday as prescribed. Denies any light-headedness, blurry vision, chest pain, palpitations, rash, or  cough.  A/P Present with elevated bp. Appear lucid and adherent to current regimen. Bp elevated possibly due to sodium intake. Will change to combination pill for better adherence. - Start amlodipine-olmesartan-hctz 40-10-12.5mg  daily - F/u in 4 weeks for lab check  Visual hallucinations Denies any additional visual / auditory / tactile hallucinations. Initially thought to be due to alcohol withdrawals but appear to persist even when not in withdrawal. Possibly due to missed diagnosis of schizophrenia vs Lewy body dementia.  A/P C/w seroquel 25mg  qhs  Alcohol use disorder, severe, in early remission [facility]) Dan Henry is a 70 yo M w/ PMH of alcohol abuse, htn presenting to [facility] for f/u management of his chronic conditions. He mentions that he has been feeling well. Denies any significant withdrawal sxs. Noted to appear to be in active withdrawal on last clinic visit, but he mentions having no recurrence of hallucinations since restarting his seroquel. Mentions continuing to drink beer every 3-4 days. Denies tremors.  A/P No withdrawal sxs on exam today. Tolerating naltrexone well.  - C/w naltrexone 50mg  daily  Need for vaccination with 13-polyvalent pneumococcal conjugate vaccine Agree to receive pneumovar this visit  Screening for colon cancer Agree to collect stool sample for FIT testing    Patient discussed with Dr. Dareen Piano  -Gilberto Better, [facility] Pager: [phone]

## 2020-08-12 NOTE — Progress Notes (Signed)
Internal Medicine Clinic Attending  Case discussed with Dr. Lee  At the time of the visit.  We reviewed the resident's history and exam and pertinent patient test results.  I agree with the assessment, diagnosis, and plan of care documented in the resident's note.    

## 2020-09-09 ENCOUNTER — Telehealth: Payer: Self-pay | Admitting: *Deleted

## 2020-09-09 ENCOUNTER — Encounter: Payer: Medicare HMO | Admitting: Student

## 2020-09-09 NOTE — Telephone Encounter (Signed)
Called patient spoke with his niece, patient no showed for his appointment. Asked niece to have patient to call clinic to rescheduled this appointment.

## 2020-09-22 ENCOUNTER — Telehealth: Payer: Self-pay

## 2020-09-22 NOTE — Telephone Encounter (Signed)
Requesting an appt for today, no appt available. Pt states he is feeling nervous, anxiety problem and spitting blood. Please call pt back.

## 2020-09-22 NOTE — Telephone Encounter (Signed)
Return call from pt - stated he has been losing his balance and feeling nervous/shakey. Stated he felled recently at a neighbor's house but he did not hurt himself. Stated he's no longer spitting up blood (it has been several days) as stated when he called earlier and spoke to front office. Appt given tomorrow @ [phone] AM with Dr Johnney Ou.

## 2020-09-22 NOTE — Telephone Encounter (Signed)
Return pt's call; no answer - left message to call the office. 

## 2020-09-23 ENCOUNTER — Ambulatory Visit (INDEPENDENT_AMBULATORY_CARE_PROVIDER_SITE_OTHER): Payer: Medicare HMO | Admitting: Student

## 2020-09-23 ENCOUNTER — Other Ambulatory Visit: Payer: Self-pay

## 2020-09-23 ENCOUNTER — Encounter: Payer: Self-pay | Admitting: Student

## 2020-09-23 ENCOUNTER — Encounter: Payer: Self-pay | Admitting: *Deleted

## 2020-09-23 DIAGNOSIS — R251 Tremor, unspecified: Secondary | ICD-10-CM

## 2020-09-23 DIAGNOSIS — F1021 Alcohol dependence, in remission: Secondary | ICD-10-CM

## 2020-09-23 DIAGNOSIS — R441 Visual hallucinations: Secondary | ICD-10-CM | POA: Diagnosis not present

## 2020-09-23 MED ORDER — QUETIAPINE FUMARATE 25 MG PO TABS: 25.0000 mg | ORAL_TABLET | Freq: Every day | ORAL | 1 refills | Status: DC

## 2020-09-23 NOTE — Progress Notes (Signed)
CC: Shaking of hands/feet  HPI:  [name] is a 70 y.o. male with a past medical history stated below and presents today for shaking of his hands and feet. Please see problem based assessment and plan for additional details.  Past Medical History:  Diagnosis Date  . Alcohol intoxication ([place]) 11/18/2019  . Alcohol withdrawal ([place]) 04/23/2019  . Alcohol withdrawal delirium ([place]) 02/14/2019  . Hypertension   . Hypokalemia 07/19/2017  . Lactic acidosis 02/20/2018  . Malnutrition of moderate degree 10/24/2018  . Nosebleed 04/23/2019  . Rhabdomyolysis 10/26/2018    Current Outpatient Medications on File Prior to Visit  Medication Sig Dispense Refill  . feeding supplement (ENSURE ENLIVE / ENSURE PLUS) LIQD Take 237 mLs by mouth 3 (three) times daily between meals. 818 mL 12  . folic acid (FOLVITE) 1 MG tablet Take 1 tablet (1 mg total) by mouth daily. 30 tablet 3  . metoprolol tartrate (LOPRESSOR) 25 MG tablet Take 1 tablet (25 mg total) by mouth 2 (two) times daily. 180 tablet 1  . Multiple Vitamin (MULTIVITAMIN WITH MINERALS) TABS tablet Take 1 tablet by mouth every morning.    . naltrexone (DEPADE) 50 MG tablet Take 1 tablet (50 mg total) by mouth daily. 90 tablet 1  . nitroGLYCERIN (NITROSTAT) 0.4 MG SL tablet Place 1 tablet (0.4 mg total) under the tongue every 5 (five) minutes as needed for chest pain. 30 tablet 3  . Olmesartan-amLODIPine-HCTZ 40-10-12.5 MG TABS Take 1 tablet by mouth daily. 90 tablet 3  . thiamine 100 MG tablet Take 1 tablet (100 mg total) by mouth daily. 30 tablet 3   No current facility-administered medications on file prior to visit.    Family History  Problem Relation Age of Onset  . Hypertension Mother   . Hypertension Father   . Hypertension Sister   . Hypertension Sister   . Stroke Sister   . Cancer Brother        throat    Social History   Socioeconomic History  . Marital status: Single    Spouse name: Not on file  . Number of  children: 0  . Years of education: 34  . Highest education level: Not on file  Occupational History  . Occupation: Retired  Tobacco Use  . Smoking status: Never Smoker  . Smokeless tobacco: Never Used  Vaping Use  . Vaping Use: Never used  Substance and Sexual Activity  . Alcohol use: Not Currently    Alcohol/week: 4.0 standard drinks    Types: 4 Standard drinks or equivalent per week    Comment: occ  . Drug use: No  . Sexual activity: Not on file  Other Topics Concern  . 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  Vitals:   09/23/20 0921  BP: (!) 153/83  Pulse: 93  Temp: 98.1 F (36.7 C)  TempSrc: Oral  SpO2: 97%  Weight: 132 lb 12.8 oz (60.2 kg)  Height: 5\' 4"  (1.626  m)     Physical Exam: Constitutional: Well-appearing, no acute distress HENT: normocephalic atraumatic Eyes: conjunctiva non-erythematous Neck: supple Cardiovascular: regular rate and rhythm, no m/r/g Pulmonary/Chest: normal work of breathing on room air MSK: normal bulk and tone Neurological: alert & oriented x 3, mild bilateral hand tremors Skin: warm and dry Psych: Normal mood and thought process   Assessment & Plan:   See Encounters Tab for problem based charting.  Patient discussed with Dr. Laurena Slimmer, D.O. [facility], PGY-1 Pager: [phone], Phone: [phone] Date 09/23/2020 Time 5:55 PM

## 2020-09-23 NOTE — Assessment & Plan Note (Signed)
Patient has had visual hallucinations while going through his alcohol detox.  He stopped taking the Seroquel for some time.  He needed a refill at today's visit and would like to restart.  Encouraged patient to continue this during his alcohol detox.Marland Kitchen  Reordered Seroquel 25 mg daily.

## 2020-09-23 NOTE — Assessment & Plan Note (Signed)
Assessment:     Plan:

## 2020-09-23 NOTE — Assessment & Plan Note (Signed)
Assessment: [name] presents to the clinic today for persistent shaking of his hands as well as feet.  States that started a week ago after he decreased his alcohol intake.  Patient has been struggling with alcohol use disorder and has recently tried to decrease his alcohol intake.  He was drinking 3 icehouse beers (8% alcohol) per day.  After 1 and half weeks he decreased this to 2 beers.  At this time he noticed some shaking that would last 10 minutes.  This did resolve and then after an additional week he decreased to 1 beer per week.  During this time he was having visual hallucinations that eventually resolved.  He was then drinking no beer for 2 weeks.  Continue to take his naltrexone during this time which helped with cravings however 1 event was too much for him and he started drinking alcohol again.  He started drinking 2 icehouse beers per day.  During this transition patient has recently tried to cut back again and during this time he noticed that he started having shaking of his hands and feet.  He notes that this has gotten better over the past few days.  He denies any episodes of seizures, hallucinations, nausea, vomiting.  He denies wanting assistance with his alcohol detox at this time and would like to continue to decrease his alcohol intake on his own.  Discussed with him risks and benefits as well as to go to the emergency department if he feels as though he is unable to handle this on his own.  Also instruct him to go to emergency room if he feels as though he is having a medical emergency or going through severe alcohol withdrawal.  Congratulated him on wanting to change his lifestyle and decrease his alcohol use.  Plan: -continue to steadily decrease alcohol use -continue naltrexone 50 mg daily -patient instructed to call clinic if he needs help with detox and to go to ED if he feels as though he is having a medical emergency

## 2020-09-23 NOTE — Patient Instructions (Signed)
Thank you, [name] for allowing [place] to provide your care today. Today we discussed    Alcohol withdrawal/alcohol use I am sorry that you are having these episodes of shaking [name].  Please continue to take your naltrexone as well as your Seroquel.  If you feel as though that you need help with your decreasing your alcohol use, please call our clinic or go to the closest emergency department.  If you feel as though you are going through withdrawals and having a medical emergency please call 911 or go to your close emergency department.  If you have an episode of seizure-like activity or passing out, please call 911 and go to your closest emergency department.  I have ordered the following labs for you:  Lab Orders  No laboratory test(s) ordered today      Referrals ordered today:   Referral Orders  No referral(s) requested today     I have ordered the following medication/changed the following medications:   Stop the following medications: There are no discontinued medications.   Start the following medications: No orders of the defined types were placed in this encounter.    Follow up: 1 month for alcohol detox/withdrawal symptoms    Should you have any questions or concerns please call the internal medicine clinic at [phone].     Sanjuana Letters, D.O. [place]

## 2020-09-24 NOTE — Progress Notes (Signed)
Internal Medicine Clinic Attending  Case discussed with Dr. Katsadouros  At the time of the visit.  We reviewed the resident's history and exam and pertinent patient test results.  I agree with the assessment, diagnosis, and plan of care documented in the resident's note.  

## 2020-09-29 ENCOUNTER — Telehealth: Payer: Self-pay

## 2020-09-29 NOTE — Telephone Encounter (Signed)
Received TC from patient who states he vomited blood 1.5 weeks ago.  States he wants an appointment to be evaluated. Pt states blood was not bright red, not black, "just red".  Describes amount as "enough to fill up a medicine cup".  States it happened only once.  He denies any throat pain, no upper GI pain, denies coughing, no fatigue, no night sweats, no chills, no weight loss.  Appt made for 10/07/20 @ 1515 w/ PCP/[place]. SChaplin, RN,BSN

## 2020-10-07 ENCOUNTER — Encounter: Payer: Medicare HMO | Admitting: Student

## 2020-10-18 ENCOUNTER — Emergency Department (HOSPITAL_COMMUNITY): Payer: Medicare HMO

## 2020-10-18 ENCOUNTER — Encounter (HOSPITAL_COMMUNITY): Payer: Self-pay | Admitting: Emergency Medicine

## 2020-10-18 ENCOUNTER — Emergency Department (HOSPITAL_COMMUNITY)
Admission: EM | Admit: 2020-10-18 | Discharge: 2020-10-18 | Disposition: A | Discharge status: Home / Self Care | Payer: Medicare HMO | Attending: Emergency Medicine | Admitting: Emergency Medicine

## 2020-10-18 ENCOUNTER — Other Ambulatory Visit: Payer: Self-pay

## 2020-10-18 DIAGNOSIS — Z79899 Other long term (current) drug therapy: Secondary | ICD-10-CM | POA: Diagnosis not present

## 2020-10-18 DIAGNOSIS — R0602 Shortness of breath: Secondary | ICD-10-CM | POA: Insufficient documentation

## 2020-10-18 DIAGNOSIS — I499 Cardiac arrhythmia, unspecified: Secondary | ICD-10-CM | POA: Diagnosis not present

## 2020-10-18 DIAGNOSIS — R079 Chest pain, unspecified: Secondary | ICD-10-CM | POA: Diagnosis not present

## 2020-10-18 DIAGNOSIS — K760 Fatty (change of) liver, not elsewhere classified: Secondary | ICD-10-CM | POA: Diagnosis not present

## 2020-10-18 DIAGNOSIS — F1021 Alcohol dependence, in remission: Secondary | ICD-10-CM | POA: Diagnosis not present

## 2020-10-18 DIAGNOSIS — R1084 Generalized abdominal pain: Secondary | ICD-10-CM | POA: Diagnosis not present

## 2020-10-18 DIAGNOSIS — R509 Fever, unspecified: Secondary | ICD-10-CM | POA: Insufficient documentation

## 2020-10-18 DIAGNOSIS — R000 Tachycardia, unspecified: Secondary | ICD-10-CM | POA: Diagnosis not present

## 2020-10-18 DIAGNOSIS — F1099 Alcohol use, unspecified with unspecified alcohol-induced disorder: Secondary | ICD-10-CM | POA: Diagnosis not present

## 2020-10-18 DIAGNOSIS — R251 Tremor, unspecified: Secondary | ICD-10-CM | POA: Insufficient documentation

## 2020-10-18 DIAGNOSIS — R61 Generalized hyperhidrosis: Secondary | ICD-10-CM | POA: Diagnosis not present

## 2020-10-18 DIAGNOSIS — R1013 Epigastric pain: Secondary | ICD-10-CM | POA: Diagnosis not present

## 2020-10-18 DIAGNOSIS — R0789 Other chest pain: Secondary | ICD-10-CM | POA: Diagnosis not present

## 2020-10-18 DIAGNOSIS — I10 Essential (primary) hypertension: Secondary | ICD-10-CM | POA: Diagnosis not present

## 2020-10-18 LAB — CBC WITH DIFFERENTIAL/PLATELET
Abs Immature Granulocytes: 0.05 10*3/uL (ref 0.00–0.07)
Basophils Absolute: 0 10*3/uL (ref 0.0–0.1)
Basophils Relative: 0 %
Eosinophils Absolute: 0 10*3/uL (ref 0.0–0.5)
Eosinophils Relative: 0 %
HCT: 37 % — ABNORMAL LOW (ref 39.0–52.0)
Hemoglobin: 12 g/dL — ABNORMAL LOW (ref 13.0–17.0)
Immature Granulocytes: 1 %
Lymphocytes Relative: 11 %
Lymphs Abs: 0.6 10*3/uL — ABNORMAL LOW (ref 0.7–4.0)
MCH: 27.1 pg (ref 26.0–34.0)
MCHC: 32.4 g/dL (ref 30.0–36.0)
MCV: 83.5 fL (ref 80.0–100.0)
Monocytes Absolute: 0.7 10*3/uL (ref 0.1–1.0)
Monocytes Relative: 12 %
Neutro Abs: 4.2 10*3/uL (ref 1.7–7.7)
Neutrophils Relative %: 76 %
Platelets: 137 10*3/uL — ABNORMAL LOW (ref 150–400)
RBC: 4.43 MIL/uL (ref 4.22–5.81)
RDW: 18.3 % — ABNORMAL HIGH (ref 11.5–15.5)
WBC: 5.6 10*3/uL (ref 4.0–10.5)
nRBC: 0 % (ref 0.0–0.2)

## 2020-10-18 LAB — COMPREHENSIVE METABOLIC PANEL
ALT: 46 U/L — ABNORMAL HIGH (ref 0–44)
AST: 73 U/L — ABNORMAL HIGH (ref 15–41)
Albumin: 4.4 g/dL (ref 3.5–5.0)
Alkaline Phosphatase: 44 U/L (ref 38–126)
Anion gap: 12 (ref 5–15)
BUN: 11 mg/dL (ref 8–23)
CO2: 26 mmol/L (ref 22–32)
Calcium: 9.1 mg/dL (ref 8.9–10.3)
Chloride: 101 mmol/L (ref 98–111)
Creatinine, Ser: 1.06 mg/dL (ref 0.61–1.24)
GFR, Estimated: >60 mL/min (ref >60)
Glucose, Bld: 132 mg/dL — ABNORMAL HIGH (ref 70–99)
Potassium: 3.2 mmol/L — ABNORMAL LOW (ref 3.5–5.1)
Sodium: 139 mmol/L (ref 135–145)
Total Bilirubin: 1.3 mg/dL — ABNORMAL HIGH (ref 0.3–1.2)
Total Protein: 8.1 g/dL (ref 6.5–8.1)

## 2020-10-18 LAB — TROPONIN I (HIGH SENSITIVITY)
Troponin I (High Sensitivity): 14 ng/L (ref <18)
Troponin I (High Sensitivity): 13 ng/L (ref <18)

## 2020-10-18 LAB — CK: Total CK: 515 U/L — ABNORMAL HIGH (ref 49–397)

## 2020-10-18 MED ORDER — IOHEXOL 300 MG/ML  SOLN
100.0000 mL | Freq: Once | INTRAMUSCULAR | Status: AC | PRN
  Administered 2020-10-18: 100 mL via INTRAVENOUS

## 2020-10-18 MED ORDER — QUETIAPINE FUMARATE 25 MG PO TABS: 25.0000 mg | ORAL_TABLET | Freq: Every day | ORAL | 1 refills | Status: DC

## 2020-10-18 MED ORDER — LORAZEPAM 2 MG/ML IJ SOLN
2.0000 mg | Freq: Once | INTRAMUSCULAR | Status: AC
  Administered 2020-10-18: 2 mg via INTRAVENOUS
  Filled 2020-10-18: qty 1

## 2020-10-18 NOTE — ED Notes (Signed)
Patient transported to CT 

## 2020-10-18 NOTE — ED Provider Notes (Signed)
[facility] EMERGENCY DEPARTMENT Provider Note   CSN: [id number] Arrival date & time: 10/18/20  1501     History Chief Complaint  Patient presents with   Chest Pain   Weakness    Cooper Moroney is a 70 y.o. male.  Linkin Vizzini told me a completely different history that he told the provider in triage.  He has a known history of heavy alcohol use, and he is having some intermittent, diffuse shaking of all of his limbs.  He states that he last drank alcohol last week.  I asked him if he thinks he might be having withdrawal, and he said yes.  He endorsed subjective fever, diffuse abdominal pain, some intermittent shortness of breath.  He denied any chest pain.  He said he is able to take all of his medication, does not suffer from housing instability, lack of access to food, or other social challenge.  The history is provided by the patient.  Abdominal Pain Pain location:  Generalized Pain quality: sharp   Pain radiates to:  Does not radiate Pain severity:  Moderate Onset quality:  Gradual Duration:  2 days Timing:  Constant Progression:  Unchanged Chronicity:  New Context: alcohol use   Context: not sick contacts, not suspicious food intake and not trauma   Relieved by:  Nothing Worsened by:  Nothing Ineffective treatments:  None tried Associated symptoms: fever, nausea and shortness of breath   Associated symptoms: no chest pain, no chills, no cough, no dysuria, no hematuria, no sore throat and no vomiting   Risk factors: alcohol abuse       Past Medical History:  Diagnosis Date   Alcohol intoxication ([place]) 11/18/2019   Alcohol withdrawal ([place]) 04/23/2019   Alcohol withdrawal delirium ([place]) 02/14/2019   Hypertension    Hypokalemia 07/19/2017   Lactic acidosis 02/20/2018   Malnutrition of moderate degree 10/24/2018   Nosebleed 04/23/2019   Rhabdomyolysis 10/26/2018    Patient Active Problem List   Diagnosis Date Noted   Need for vaccination with  13-polyvalent pneumococcal conjugate vaccine 08/10/2020   Screening for colon cancer 08/10/2020   Cognitive dysfunction 06/10/2020   Iron deficiency anemia 11/22/2019   Syncope 11/18/2019   Stable angina ([place]) 05/08/2019   Anemia 02/26/2019   Urinary retention 10/25/2018   Malnutrition of moderate degree 10/24/2018   Alcohol use disorder, severe, in early remission ([place]) 10/23/2018   Hepatic hemangioma 10/19/2018   Visual hallucinations 06/26/2018   Abnormal TSH 06/26/2018   Need for hepatitis B vaccination 02/21/2018   Unintentional weight loss 02/16/2018   Benign essential tremor 07/19/2017   Elevated LFTs 05/16/2017   ASCVD (arteriosclerotic cardiovascular disease) 05/16/2017   Acute kidney injury ([place]) 05/08/2017   Right carotid bruit 05/08/2017   Essential hypertension 05/21/2015   Healthcare maintenance 05/21/2015    Past Surgical History:  Procedure Laterality Date   NO PAST SURGERIES         Family History  Problem Relation Age of Onset   Hypertension Mother    Hypertension Father    Hypertension Sister    Hypertension Sister    Stroke Sister    Cancer Brother        throat    Social History   Tobacco Use   Smoking status: Never   Smokeless tobacco: Never  Vaping Use   Vaping Use: Never used  Substance Use Topics   Alcohol use: Not Currently    Alcohol/week: 4.0 standard drinks    Types: 4 Standard drinks  or equivalent per week    Comment: occ   Drug use: No    Home Medications Prior to Admission medications   Medication Sig Start Date End Date Taking? Authorizing Provider  feeding supplement (ENSURE ENLIVE / ENSURE PLUS) LIQD Take 237 mLs by mouth 3 (three) times daily between meals. 02/13/20   Cato Mulligan, MD  folic acid (FOLVITE) 1 MG tablet Take 1 tablet (1 mg total) by mouth daily. 02/20/20   Harvie Heck, MD  metoprolol tartrate (LOPRESSOR) 25 MG tablet Take 1 tablet (25 mg total) by mouth 2 (two) times daily. 06/10/20 12/07/20  Jose Persia, MD  Multiple Vitamin (MULTIVITAMIN WITH MINERALS) TABS tablet Take 1 tablet by mouth every morning.    [provider]  naltrexone (DEPADE) 50 MG tablet Take 1 tablet (50 mg total) by mouth daily. 06/10/20   Jose Persia, MD  nitroGLYCERIN (NITROSTAT) 0.4 MG SL tablet Place 1 tablet (0.4 mg total) under the tongue every 5 (five) minutes as needed for chest pain. 06/10/20   Jose Persia, MD  Olmesartan-amLODIPine-HCTZ 40-10-12.5 MG TABS Take 1 tablet by mouth daily. 08/10/20   [name], MD  QUEtiapine (SEROQUEL) 25 MG tablet Take 1 tablet (25 mg total) by mouth at bedtime. 09/23/20   Riesa Pope, MD  thiamine 100 MG tablet Take 1 tablet (100 mg total) by mouth daily. 06/10/20   Jose Persia, MD    Allergies    Patient has no known allergies.  Review of Systems   Review of Systems  Constitutional:  Positive for fever. Negative for chills.  HENT:  Negative for ear pain and sore throat.   Eyes:  Negative for pain and visual disturbance.  Respiratory:  Positive for shortness of breath. Negative for cough.   Cardiovascular:  Negative for chest pain and palpitations.  Gastrointestinal:  Positive for abdominal pain and nausea. Negative for vomiting.  Genitourinary:  Negative for dysuria and hematuria.  Musculoskeletal:  Negative for arthralgias and back pain.  Skin:  Negative for color change and rash.  Neurological:  Positive for tremors. Negative for seizures and syncope.  All other systems reviewed and are negative.  Physical Exam Updated Vital Signs BP (!) 190/101   Pulse 95   Temp 98.6 F (37 C)   Resp 18   Ht 5\' 4"  (1.626 m)   Wt 61.2 kg   SpO2 99%   BMI 23.17 kg/m   Physical Exam Vitals and nursing note reviewed.  Constitutional:      Appearance: He is well-developed.     Comments: Thin, diffusely tremulous  HENT:     Head: Normocephalic and atraumatic.  Eyes:     Conjunctiva/sclera: Conjunctivae normal.  Cardiovascular:     Rate and  Rhythm: Normal rate and regular rhythm.     Heart sounds: No murmur heard. Pulmonary:     Effort: Pulmonary effort is normal. No respiratory distress.     Breath sounds: Normal breath sounds.  Abdominal:     Palpations: Abdomen is soft.     Tenderness: There is abdominal tenderness.     Comments: Mild, diffuse tenderness to palpation  Musculoskeletal:     Cervical back: Neck supple.     Right lower leg: No edema.     Left lower leg: No edema.  Skin:    General: Skin is warm and dry.  Neurological:     General: No focal deficit present.     Mental Status: He is alert.    ED Results /  Procedures / Treatments   Labs (all labs ordered are listed, but only abnormal results are displayed) Labs Reviewed  COMPREHENSIVE METABOLIC PANEL - Abnormal; Notable for the following components:      Result Value   Potassium 3.2 (*)    Glucose, Bld 132 (*)    AST 73 (*)    ALT 46 (*)    Total Bilirubin 1.3 (*)    All other components within normal limits  CBC WITH DIFFERENTIAL/PLATELET - Abnormal; Notable for the following components:   Hemoglobin 12.0 (*)    HCT 37.0 (*)    RDW 18.3 (*)    Platelets 137 (*)    Lymphs Abs 0.6 (*)    All other components within normal limits  CK - Abnormal; Notable for the following components:   Total CK 515 (*)    All other components within normal limits  TSH  TROPONIN I (HIGH SENSITIVITY)  TROPONIN I (HIGH SENSITIVITY)    EKG EKG Interpretation  Date/Time:  Sunday October 18 2020 15:11:48 EDT Ventricular Rate:  92 PR Interval:  128 QRS Duration: 90 QT Interval:  380 QTC Calculation: 469 R Axis:   60 Text Interpretation: Normal sinus rhythm Minimal voltage criteria for LVH, may be normal variant ( Sokolow-Lyon ) Nonspecific ST and T wave abnormality Abnormal ECG Similar to prior Confirmed by Lorre Munroe ([id number]) on 10/18/2020 5:12:25 PM  Radiology DG Chest 2 View  Result Date: 10/18/2020 CLINICAL DATA:  Chest pain, fever EXAM: CHEST - 2 VIEW  COMPARISON:  02/10/2020 FINDINGS: The heart size and mediastinal contours are within normal limits. No focal airspace consolidation, pleural effusion, or pneumothorax. The visualized skeletal structures are unremarkable. IMPRESSION: No active cardiopulmonary disease. Electronically Signed   By: Davina Poke D.O.   On: 10/18/2020 16:13   CT Abdomen Pelvis W Contrast  Result Date: 10/18/2020 CLINICAL DATA:  Abdominal pain and fever. EXAM: CT ABDOMEN AND PELVIS WITH CONTRAST TECHNIQUE: Multidetector CT imaging of the abdomen and pelvis was performed using the standard protocol following bolus administration of intravenous contrast. CONTRAST:  147mL OMNIPAQUE IOHEXOL 300 MG/ML  SOLN COMPARISON:  None. FINDINGS: Lower chest: No acute abnormality. Hepatobiliary: Hepatic steatosis.  Normal gallbladder. Pancreas: Unremarkable. No pancreatic ductal dilatation or surrounding inflammatory changes. Spleen: Normal in size without focal abnormality. Adrenals/Urinary Tract: Adrenal glands are unremarkable. Kidneys are normal, without renal calculi, focal lesion, or hydronephrosis. Bladder is unremarkable. Stomach/Bowel: Stomach is within normal limits. No evidence of appendicitis. No evidence of bowel wall thickening, distention, or inflammatory changes. Vascular/Lymphatic: No significant vascular findings are present. No enlarged abdominal or pelvic lymph nodes. Reproductive: Prostate is unremarkable. Other: No abdominal wall hernia or abnormality. No abdominopelvic ascites. Musculoskeletal: No acute or significant osseous findings. IMPRESSION: 1. No evidence of acute abnormalities within the abdomen or pelvis. 2. Hepatic steatosis. Electronically Signed   By: Fidela Salisbury M.D.   On: 10/18/2020 18:13    Procedures Procedures   Medications Ordered in ED Medications  iohexol (OMNIPAQUE) 300 MG/ML solution 100 mL (100 mLs Intravenous Contrast Given 10/18/20 1755)  LORazepam (ATIVAN) injection 2 mg (2 mg  Intravenous Given 10/18/20 1817)    ED Course  I have reviewed the triage vital signs and the nursing notes.  Pertinent labs & imaging results that were available during my care of the patient were reviewed by me and considered in my medical decision making (see chart for details).    MDM Rules/Calculators/A&P  [name] presented to the ED for a variety of complaints.  He initially endorsed chest pain which was evaluated.  No evidence of acute coronary syndrome, pneumonia, or other emergent condition.  He complained to me of tremulousness of his extremities and some abdominal pain.  He has recently been seen at his primary care provider for tremulousness.  He does admit to recently discontinuing alcohol and last drinking approximately 1 week ago.  It seems that some of his shaking might be secondary to this, but he is not in florid alcohol withdrawal.  Alternatively, he may have some chronic neurologic changes either from longstanding alcohol use or other underlying neurologic pathology.  He was also evaluated for evidence of intra-abdominal pathology.  CT scan was unremarkable.  He was feeling better at discharge and agreed to follow-up with his primary doctor.  He did request a refill on his Seroquel. Final Clinical Impression(s) / ED Diagnoses Final diagnoses:  Generalized abdominal pain  Tremulousness  Alcohol use disorder, severe, in early remission [facility])    Rx / DC Orders ED Discharge Orders          Ordered    QUEtiapine (SEROQUEL) 25 MG tablet  Daily at bedtime        10/18/20 2038             Arnaldo Natal, MD 10/18/20 2040

## 2020-10-18 NOTE — ED Notes (Addendum)
All appropriate discharge materials reviewed at length with patient. Time for questions provided. Pt has no other questions at this time and verbalizes understanding of all provided materials.  

## 2020-10-18 NOTE — ED Triage Notes (Signed)
Pt here via [facility] from home for cp and generally not feeling well since yesterday. Pt reports feeling dehydrated. Hx HTN, noncompliant w/ meds. Non-radiating central cp w/ nausea, shob.

## 2020-10-18 NOTE — ED Provider Notes (Signed)
Emergency Medicine Provider Triage Evaluation Note  Dan Henry , a 70 y.o. male  was evaluated in triage.  Pt complains of gradual onset, constant, sharp, substernal chest pain that began yesterday with associated SOB. Also had nausea and dry heaving this morning. NO diaphoresis. Denies hx of cardiac issues despite prescription for NTG. PT did not take any NTG today or yesterday. HE was provided a small amount of fluid with EMS: no other intervention. PT is a non smoker. Denies Fhx of CAD < 65. No other complaints.  Review of Systems  Positive: + chest pain, SOB,  nausea Negative: - leg swelling, diaphoresis  Physical Exam  BP (!) 188/93 (BP Location: Right Arm)   Pulse 100   Temp 98.6 F (37 C)   Resp 20   Ht 5\' 4"  (1.626 m)   Wt 61.2 kg   SpO2 98%   BMI 23.17 kg/m  Gen:   Awake, no distress   Resp:  Normal effort  MSK:   Moves extremities without difficulty  Other:    Medical Decision Making  Medically screening exam initiated at 3:20 PM.  Appropriate orders placed.  Dan Henry was informed that the remainder of the evaluation will be completed by another provider, this initial triage assessment does not replace that evaluation, and the importance of remaining in the ED until their evaluation is complete.  ACS workup    Eustaquio Maize, Hershal Coria 10/18/20 1521    Arnaldo Natal, MD 10/18/20 [phone]

## 2020-10-22 ENCOUNTER — Other Ambulatory Visit: Payer: Self-pay

## 2020-10-22 ENCOUNTER — Ambulatory Visit (INDEPENDENT_AMBULATORY_CARE_PROVIDER_SITE_OTHER): Payer: Medicare HMO | Admitting: Internal Medicine

## 2020-10-22 VITALS — BP 169/88 | HR 75 | Temp 98.3°F | Ht 64.0 in | Wt 127.7 lb

## 2020-10-22 DIAGNOSIS — F1021 Alcohol dependence, in remission: Secondary | ICD-10-CM | POA: Diagnosis not present

## 2020-10-22 DIAGNOSIS — R7989 Other specified abnormal findings of blood chemistry: Secondary | ICD-10-CM

## 2020-10-22 DIAGNOSIS — D508 Other iron deficiency anemias: Secondary | ICD-10-CM | POA: Diagnosis not present

## 2020-10-22 DIAGNOSIS — I10 Essential (primary) hypertension: Secondary | ICD-10-CM | POA: Diagnosis not present

## 2020-10-22 NOTE — Assessment & Plan Note (Addendum)
BP Readings from Last 3 Encounters:  10/22/20 (!) 169/88  10/18/20 (!) 164/105  09/23/20 (!) 153/83   [name] presents with persistent hypertension. Patient reports that he often misses doses and usually takes his medication every other day. No issues with medication access. He denies any headaches, lightheadedness/dizziness, chest pain, shortness of breath or focal weakness.  Encouraged to take medications as prescribed and keep BP log.  Plan: - Continue amlodipine-olmesartan-HCTZ 40-10-12.5mg  daily  - Continue lopressor 25mg  bid - Encouraged to keep BP log - F/u in 2 weeks for BP check and medication adjustments

## 2020-10-22 NOTE — Progress Notes (Signed)
   CC: hypertension, f/u ED visit for tremors  HPI:  [name] is a 70 y.o. male with PMHx as stated below presenting for hypertension and follow up of his recent ED visits for tremors and epistaxis. Patient reports resolution of his concerns following the ED visits and has not had any further episodes of epistaxis or tremors. Please see problem based charting for complete assessment and plan.  Past Medical History:  Diagnosis Date   Alcohol intoxication ([place]) 11/18/2019   Alcohol withdrawal ([place]) 04/23/2019   Alcohol withdrawal delirium ([place]) 02/14/2019   Hypertension    Hypokalemia 07/19/2017   Lactic acidosis 02/20/2018   Malnutrition of moderate degree 10/24/2018   Nosebleed 04/23/2019   Rhabdomyolysis 10/26/2018   Review of Systems:  Negative except as stated in HPI.   Physical Exam:  Vitals:   10/22/20 1359  BP: (!) 169/88  Pulse: 75  Temp: 98.3 F (36.8 C)  TempSrc: Oral  SpO2: 100%  Weight: 127 lb 11.2 oz (57.9 kg)  Height: 5\' 4"  (1.626 m)   Physical Exam  Constitutional: Appears well-developed and well-nourished. No distress.  HENT: Normocephalic and atraumatic, EOMI, conjunctiva normal, moist mucous membranes Cardiovascular: Normal rate, regular rhythm, S1 and S2 present, no murmurs, rubs, gallops.  Distal pulses intact Respiratory: No respiratory distress, no accessory muscle use. Lungs are clear to auscultation bilaterally. GI: Nondistended, soft, nontender to palpation, normal bowel sounds Musculoskeletal: Normal bulk and tone.  No peripheral edema noted. Neurological: Is alert and oriented x4, no apparent focal deficits noted. Skin: Warm and dry.  No rash, erythema, lesions noted. Psychiatric: Normal mood and affect.    Assessment & Plan:   See Encounters Tab for problem based charting.  Patient discussed with Dr.  Jimmye Norman

## 2020-10-22 NOTE — Patient Instructions (Addendum)
[name],  It was a pleasure seeing you in clinic. Today we discussed:   Blood pressure:  Your blood pressure is very high. Please take your medications as prescribed.  Olmesartan-Amlodipine-HCTZ ONCE DAILY Metoprolol 25mg  TWICE DAILY  Continue to check your BP at home. Follow up in 2 weeks.   Tremors: I'm glad that these have improved. Please continue to refrain from alcohol use.  I am checking some lab work and will call you with any abnormal results.   If you have any questions or concerns, please call our clinic at [phone] between 9am-5pm and after hours call [phone] and ask for the internal medicine resident on call. If you feel you are having a medical emergency please call 911.   Thank you, we look forward to helping you remain healthy!

## 2020-10-23 LAB — CMP14 + ANION GAP
ALT: 92 IU/L — ABNORMAL HIGH (ref 0–44)
AST: 97 IU/L — ABNORMAL HIGH (ref 0–40)
Albumin/Globulin Ratio: 1.8 (ref 1.2–2.2)
Albumin: 4.5 g/dL (ref 3.8–4.8)
Alkaline Phosphatase: 48 IU/L (ref 44–121)
Anion Gap: 17 mmol/L (ref 10.0–18.0)
BUN/Creatinine Ratio: 15 (ref 10–24)
BUN: 15 mg/dL (ref 8–27)
Bilirubin Total: 0.4 mg/dL (ref 0.0–1.2)
CO2: 20 mmol/L (ref 20–29)
Calcium: 9 mg/dL (ref 8.6–10.2)
Chloride: 107 mmol/L — ABNORMAL HIGH (ref 96–106)
Creatinine, Ser: 1 mg/dL (ref 0.76–1.27)
Globulin, Total: 2.5 g/dL (ref 1.5–4.5)
Glucose: 102 mg/dL — ABNORMAL HIGH (ref 65–99)
Potassium: 3.7 mmol/L (ref 3.5–5.2)
Sodium: 144 mmol/L (ref 134–144)
Total Protein: 7 g/dL (ref 6.0–8.5)
eGFR: 81 mL/min/{1.73_m2} (ref >59)

## 2020-10-23 LAB — CBC
Hematocrit: 32.2 % — ABNORMAL LOW (ref 37.5–51.0)
Hemoglobin: 10.7 g/dL — ABNORMAL LOW (ref 13.0–17.7)
MCH: 27.3 pg (ref 26.6–33.0)
MCHC: 33.2 g/dL (ref 31.5–35.7)
MCV: 82 fL (ref 79–97)
Platelets: 137 10*3/uL — ABNORMAL LOW (ref 150–450)
RBC: 3.92 x10E6/uL — ABNORMAL LOW (ref 4.14–5.80)
RDW: 17 % — ABNORMAL HIGH (ref 11.6–15.4)
WBC: 5.7 10*3/uL (ref 3.4–10.8)

## 2020-10-27 DIAGNOSIS — F10251 Alcohol dependence with alcohol-induced psychotic disorder with hallucinations: Secondary | ICD-10-CM | POA: Diagnosis not present

## 2020-10-27 DIAGNOSIS — D696 Thrombocytopenia, unspecified: Secondary | ICD-10-CM | POA: Diagnosis not present

## 2020-10-27 DIAGNOSIS — Z79899 Other long term (current) drug therapy: Secondary | ICD-10-CM | POA: Diagnosis not present

## 2020-10-27 DIAGNOSIS — I208 Other forms of angina pectoris: Secondary | ICD-10-CM | POA: Diagnosis not present

## 2020-10-27 DIAGNOSIS — D649 Anemia, unspecified: Secondary | ICD-10-CM | POA: Diagnosis not present

## 2020-10-27 DIAGNOSIS — I10 Essential (primary) hypertension: Secondary | ICD-10-CM | POA: Diagnosis not present

## 2020-10-27 DIAGNOSIS — Z125 Encounter for screening for malignant neoplasm of prostate: Secondary | ICD-10-CM | POA: Diagnosis not present

## 2020-10-27 DIAGNOSIS — K700 Alcoholic fatty liver: Secondary | ICD-10-CM | POA: Diagnosis not present

## 2020-10-27 DIAGNOSIS — E559 Vitamin D deficiency, unspecified: Secondary | ICD-10-CM | POA: Diagnosis not present

## 2020-10-29 NOTE — Assessment & Plan Note (Signed)
Mr Kyshaun Barnette presented to the clinic today for follow up of his recent ED visit on 6/19 for evaluation of diffuse tremors in setting of decreased alcohol intake. He had recently stopped drinking alcohol. Patient was treated with Ativan with improvement in his symptoms and discharged. Since his visit, patient reports doing well overall and has had almost resolution of his tremors. I advised patient that this was likely secondary to alcohol withdrawals. He expressed understanding. I encouraged patient to continue alcohol cessation at this time.   Plan: Continue to encourage alcohol cessation Continue naltrexone 50mg  daily

## 2020-10-29 NOTE — Assessment & Plan Note (Signed)
Baseline Hb 11-12. Persistent normocytic anemia on labs today, stable. He did have an episode of epistaxis two weeks prior for which he was evaluated in the ED. Denied any melena or hematochezia. No recent colonoscopy. I suspect this may be secondary to his thrombocytopenia secondary to possible cirrhosis in setting of alcohol use disorder.  Plan: Continue to monitor Continue ferrous sulfate 325mg  daily

## 2020-10-29 NOTE — Assessment & Plan Note (Signed)
Patient has had previously noted persistent mild elevation of LFTs.Suspect this may be in setting of his alcohol use history. He continues to have elevated LFT's on CMP at this visit. FIB-4 score of 5 suggestive of advanced fibrosis, likely in setting of his history of severe alcohol use disorder, in early remission. Prior RUQ [place] in 2020 wnl; however, CT Abd/Pelvis in 09/2020 with evidence of hepatic steatosis.   Plan: RUQ [place] w/elastography Trend LFT's  Encourage continued alcohol cessation

## 2020-11-03 NOTE — Progress Notes (Signed)
Internal Medicine Clinic Attending  Case discussed with Dr. Marva Panda  At the time of the visit.  We reviewed the resident's history and exam and pertinent patient test results.  I agree with the assessment, diagnosis, and plan of care documented in the resident's note. Ordinarily his degree of mild thrombocytopenia would not be associated with bleeding abnormalities, though perhaps in combination with severe uncontrolled HTN (if present), or benign mucosal lesion it may contribute.

## 2020-11-12 ENCOUNTER — Encounter: Payer: Medicare HMO | Admitting: Student

## 2020-11-13 ENCOUNTER — Ambulatory Visit (HOSPITAL_COMMUNITY): Admission: RE | Admit: 2020-11-13 | Payer: Medicare Other | Source: Ambulatory Visit

## 2020-12-29 ENCOUNTER — Encounter (INDEPENDENT_AMBULATORY_CARE_PROVIDER_SITE_OTHER): Payer: Self-pay | Admitting: *Deleted

## 2021-05-17 ENCOUNTER — Ambulatory Visit (INDEPENDENT_AMBULATORY_CARE_PROVIDER_SITE_OTHER): Payer: Medicare Other | Admitting: Gastroenterology

## 2021-06-16 ENCOUNTER — Encounter: Payer: Self-pay | Admitting: *Deleted

## 2021-07-13 ENCOUNTER — Encounter: Payer: Medicare Other | Admitting: Student

## 2021-07-14 ENCOUNTER — Encounter: Payer: Self-pay | Admitting: Student

## 2021-09-23 ENCOUNTER — Emergency Department (HOSPITAL_COMMUNITY)
Admission: EM | Admit: 2021-09-23 | Discharge: 2021-09-23 | Discharge status: Against Medical Advice | Payer: Medicare Other | Attending: Emergency Medicine | Admitting: Emergency Medicine

## 2021-09-23 ENCOUNTER — Encounter (HOSPITAL_COMMUNITY): Payer: Self-pay

## 2021-09-23 ENCOUNTER — Emergency Department (HOSPITAL_COMMUNITY): Payer: Medicare Other

## 2021-09-23 ENCOUNTER — Other Ambulatory Visit: Payer: Self-pay

## 2021-09-23 DIAGNOSIS — Z131 Encounter for screening for diabetes mellitus: Secondary | ICD-10-CM | POA: Insufficient documentation

## 2021-09-23 DIAGNOSIS — R4182 Altered mental status, unspecified: Secondary | ICD-10-CM | POA: Diagnosis not present

## 2021-09-23 DIAGNOSIS — R251 Tremor, unspecified: Secondary | ICD-10-CM | POA: Diagnosis present

## 2021-09-23 DIAGNOSIS — Z5321 Procedure and treatment not carried out due to patient leaving prior to being seen by health care provider: Secondary | ICD-10-CM | POA: Insufficient documentation

## 2021-09-23 LAB — CBC WITH DIFFERENTIAL/PLATELET
Abs Immature Granulocytes: 0.02 10*3/uL (ref 0.00–0.07)
Basophils Absolute: 0.1 10*3/uL (ref 0.0–0.1)
Basophils Relative: 1 %
Eosinophils Absolute: 0.2 10*3/uL (ref 0.0–0.5)
Eosinophils Relative: 4 %
HCT: 36.2 % — ABNORMAL LOW (ref 39.0–52.0)
Hemoglobin: 12.2 g/dL — ABNORMAL LOW (ref 13.0–17.0)
Immature Granulocytes: 0 %
Lymphocytes Relative: 19 %
Lymphs Abs: 1.3 10*3/uL (ref 0.7–4.0)
MCH: 28.6 pg (ref 26.0–34.0)
MCHC: 33.7 g/dL (ref 30.0–36.0)
MCV: 84.8 fL (ref 80.0–100.0)
Monocytes Absolute: 0.7 10*3/uL (ref 0.1–1.0)
Monocytes Relative: 10 %
Neutro Abs: 4.5 10*3/uL (ref 1.7–7.7)
Neutrophils Relative %: 66 %
Platelets: 183 10*3/uL (ref 150–400)
RBC: 4.27 MIL/uL (ref 4.22–5.81)
RDW: 18.6 % — ABNORMAL HIGH (ref 11.5–15.5)
WBC: 6.9 10*3/uL (ref 4.0–10.5)
nRBC: 0 % (ref 0.0–0.2)

## 2021-09-23 LAB — CBG MONITORING, ED: Glucose-Capillary: 98 mg/dL (ref 70–99)

## 2021-09-23 LAB — COMPREHENSIVE METABOLIC PANEL
ALT: 34 U/L (ref 0–44)
AST: 56 U/L — ABNORMAL HIGH (ref 15–41)
Albumin: 3.9 g/dL (ref 3.5–5.0)
Alkaline Phosphatase: 65 U/L (ref 38–126)
Anion gap: 10 (ref 5–15)
BUN: 9 mg/dL (ref 8–23)
CO2: 24 mmol/L (ref 22–32)
Calcium: 9.2 mg/dL (ref 8.9–10.3)
Chloride: 104 mmol/L (ref 98–111)
Creatinine, Ser: 0.91 mg/dL (ref 0.61–1.24)
GFR, Estimated: >60 mL/min (ref >60)
Glucose, Bld: 104 mg/dL — ABNORMAL HIGH (ref 70–99)
Potassium: 2.9 mmol/L — ABNORMAL LOW (ref 3.5–5.1)
Sodium: 138 mmol/L (ref 135–145)
Total Bilirubin: 0.7 mg/dL (ref 0.3–1.2)
Total Protein: 7.4 g/dL (ref 6.5–8.1)

## 2021-09-23 LAB — AMMONIA: Ammonia: 18 umol/L (ref 9–35)

## 2021-09-23 NOTE — ED Triage Notes (Signed)
Pt BIB [facility] from the drs office with no complaints. Was there for check up and now they want him to have a more extensive workup but yet pt has not complaints and has no idea why they sent him here.

## 2021-09-23 NOTE — ED Provider Triage Note (Signed)
Emergency Medicine Provider Triage Evaluation Note  Dan Henry , a 71 y.o. male  was evaluated in triage.  Pt has no complaints.  I spoke with his primary care doctor Dr. Manuella Ghazi who stated that he was altered in the office and has been falling more frequently without any identified cause.  He also mention that the patient was tremulous on exam and disoriented on the time.  Patient does have a history of alcohol abuse and he sent him here for further evaluation.  Review of Systems  Positive:  Negative: See above   Physical Exam  BP (!) 158/82   Pulse 83   Temp 98.2 F (36.8 C)   Resp 16   SpO2 100% Comment: Simultaneous filing. User may not have seen previous data. Gen:   Awake, no distress   Resp:  Normal effort  MSK:   Moves extremities without difficulty  Other:  Patient is alert and oriented x3.  No signs of tremors.  No asterixis.  Medical Decision Making  Medically screening exam initiated at 4:10 PM.  Appropriate orders placed.  Dan Henry was informed that the remainder of the evaluation will be completed by another provider, this initial triage assessment does not replace that evaluation, and the importance of remaining in the ED until their evaluation is complete.    Myna Bright [place], [place] 09/23/21 [phone]

## 2021-09-23 NOTE — ED Notes (Signed)
Pt stated "I am thinking about leaving because I cant wait any longer". Pt advised he should stay.

## 2021-09-23 NOTE — ED Notes (Signed)
Patient called for room once with no response and not visible in lobby

## 2021-09-23 NOTE — ED Notes (Signed)
Patient called on number provided for contact. No answer. Not answering name in lobby at this time

## 2021-10-15 ENCOUNTER — Other Ambulatory Visit: Payer: Self-pay

## 2021-10-15 ENCOUNTER — Emergency Department (HOSPITAL_COMMUNITY)
Admission: EM | Admit: 2021-10-15 | Discharge: 2021-10-15 | Disposition: A | Discharge status: Home / Self Care | Payer: Medicare Other | Attending: Emergency Medicine | Admitting: Emergency Medicine

## 2021-10-15 ENCOUNTER — Encounter (HOSPITAL_COMMUNITY): Payer: Self-pay

## 2021-10-15 DIAGNOSIS — I10 Essential (primary) hypertension: Secondary | ICD-10-CM | POA: Diagnosis not present

## 2021-10-15 DIAGNOSIS — Z79899 Other long term (current) drug therapy: Secondary | ICD-10-CM | POA: Diagnosis not present

## 2021-10-15 DIAGNOSIS — R040 Epistaxis: Secondary | ICD-10-CM

## 2021-10-15 DIAGNOSIS — F10920 Alcohol use, unspecified with intoxication, uncomplicated: Secondary | ICD-10-CM

## 2021-10-15 LAB — COMPREHENSIVE METABOLIC PANEL
ALT: 26 U/L (ref 0–44)
AST: 53 U/L — ABNORMAL HIGH (ref 15–41)
Albumin: 4.5 g/dL (ref 3.5–5.0)
Alkaline Phosphatase: 57 U/L (ref 38–126)
Anion gap: 13 (ref 5–15)
BUN: 10 mg/dL (ref 8–23)
CO2: 21 mmol/L — ABNORMAL LOW (ref 22–32)
Calcium: 9.3 mg/dL (ref 8.9–10.3)
Chloride: 109 mmol/L (ref 98–111)
Creatinine, Ser: 0.85 mg/dL (ref 0.61–1.24)
GFR, Estimated: >60 mL/min (ref >60)
Glucose, Bld: 101 mg/dL — ABNORMAL HIGH (ref 70–99)
Potassium: 3.4 mmol/L — ABNORMAL LOW (ref 3.5–5.1)
Sodium: 143 mmol/L (ref 135–145)
Total Bilirubin: 1 mg/dL (ref 0.3–1.2)
Total Protein: 8.3 g/dL — ABNORMAL HIGH (ref 6.5–8.1)

## 2021-10-15 LAB — CBC WITH DIFFERENTIAL/PLATELET
Abs Immature Granulocytes: 0.01 10*3/uL (ref 0.00–0.07)
Basophils Absolute: 0.1 10*3/uL (ref 0.0–0.1)
Basophils Relative: 1 %
Eosinophils Absolute: 0.1 10*3/uL (ref 0.0–0.5)
Eosinophils Relative: 1 %
HCT: 38.4 % — ABNORMAL LOW (ref 39.0–52.0)
Hemoglobin: 12.6 g/dL — ABNORMAL LOW (ref 13.0–17.0)
Immature Granulocytes: 0 %
Lymphocytes Relative: 23 %
Lymphs Abs: 1 10*3/uL (ref 0.7–4.0)
MCH: 28.1 pg (ref 26.0–34.0)
MCHC: 32.8 g/dL (ref 30.0–36.0)
MCV: 85.5 fL (ref 80.0–100.0)
Monocytes Absolute: 0.5 10*3/uL (ref 0.1–1.0)
Monocytes Relative: 12 %
Neutro Abs: 2.7 10*3/uL (ref 1.7–7.7)
Neutrophils Relative %: 63 %
Platelets: 210 10*3/uL (ref 150–400)
RBC: 4.49 MIL/uL (ref 4.22–5.81)
RDW: 19.1 % — ABNORMAL HIGH (ref 11.5–15.5)
WBC: 4.2 10*3/uL (ref 4.0–10.5)
nRBC: 0 % (ref 0.0–0.2)

## 2021-10-15 LAB — ETHANOL: Alcohol, Ethyl (B): 93 mg/dL — ABNORMAL HIGH (ref <10)

## 2021-10-15 MED ORDER — HYDRALAZINE HCL 20 MG/ML IJ SOLN
10.0000 mg | Freq: Once | INTRAMUSCULAR | Status: AC
  Administered 2021-10-15: 10 mg via INTRAVENOUS
  Filled 2021-10-15: qty 1

## 2021-10-15 MED ORDER — OXYMETAZOLINE HCL 0.05 % NA SOLN
1.0000 | Freq: Once | NASAL | Status: AC
Start: 2021-10-15 — End: 2021-10-15
  Administered 2021-10-15: 1 via NASAL
  Filled 2021-10-15: qty 30

## 2021-10-15 NOTE — ED Triage Notes (Signed)
Pt coming from home via EMS with c/o hypertension and nosebleed that lasted 30 minutes before EMS arrived. Pt has hx of same. Pt states that he took his BP meds this AM but did not take them this afternoon. Pt reports that he drank 1 beer this afternoon.

## 2021-10-15 NOTE — ED Provider Notes (Signed)
[facility] DEPT Provider Note   CSN: [id number] Arrival date & time: 10/15/21  1757     History  Chief Complaint  Patient presents with   Hypertension    Dan Henry is a 71 y.o. male hx of HTN, here presenting with hypertension, alcohol intoxication, nosebleed.  Patient states that he drank 1 beer today.  He states that he has nosebleed to the left nostril for about 30 minutes and a did stop.  Patient also was noted to be hypertensive with blood pressure over 220.  Patient states that he did not take his evening dose of blood pressure medicine.  Denies any headache.  Denies any chest pain.  The history is provided by the patient.       Home Medications Prior to Admission medications   Medication Sig Start Date End Date Taking? Authorizing Provider  feeding supplement (ENSURE ENLIVE / ENSURE PLUS) LIQD Take 237 mLs by mouth 3 (three) times daily between meals. 02/13/20   Cato Mulligan, MD  folic acid (FOLVITE) 1 MG tablet Take 1 tablet (1 mg total) by mouth daily. 02/20/20   Harvie Heck, MD  metoprolol tartrate (LOPRESSOR) 25 MG tablet Take 1 tablet (25 mg total) by mouth 2 (two) times daily. 06/10/20 12/07/20  Jose Persia, MD  Multiple Vitamin (MULTIVITAMIN WITH MINERALS) TABS tablet Take 1 tablet by mouth every morning.    [provider]  naltrexone (DEPADE) 50 MG tablet Take 1 tablet (50 mg total) by mouth daily. 06/10/20   Jose Persia, MD  nitroGLYCERIN (NITROSTAT) 0.4 MG SL tablet Place 1 tablet (0.4 mg total) under the tongue every 5 (five) minutes as needed for chest pain. 06/10/20   Jose Persia, MD  Olmesartan-amLODIPine-HCTZ 40-10-12.5 MG TABS Take 1 tablet by mouth daily. 08/10/20   [name], MD  QUEtiapine (SEROQUEL) 25 MG tablet Take 1 tablet (25 mg total) by mouth at bedtime. 10/18/20   Arnaldo Natal, MD  thiamine 100 MG tablet Take 1 tablet (100 mg total) by mouth daily. 06/10/20   Jose Persia, MD       Allergies    Patient has no known allergies.    Review of Systems   Review of Systems  HENT:  Positive for nosebleeds.   All other systems reviewed and are negative.   Physical Exam Updated Vital Signs BP (!) 184/101   Pulse 86   Temp 98.6 F (37 C)   Resp 16   Ht '5\' 4"'$  (1.626 m)   Wt 57.6 kg   SpO2 96%   BMI 21.80 kg/m  Physical Exam Vitals and nursing note reviewed.  Constitutional:      Comments: Slightly intoxicated  HENT:     Head: Normocephalic.     Nose:     Comments: Dried blood to the left nostril    Mouth/Throat:     Mouth: Mucous membranes are moist.  Eyes:     Extraocular Movements: Extraocular movements intact.     Pupils: Pupils are equal, round, and reactive to light.  Cardiovascular:     Rate and Rhythm: Normal rate and regular rhythm.     Pulses: Normal pulses.     Heart sounds: Normal heart sounds.  Pulmonary:     Effort: Pulmonary effort is normal.     Breath sounds: Normal breath sounds.  Abdominal:     General: Abdomen is flat.     Palpations: Abdomen is soft.  Musculoskeletal:        General:  Normal range of motion.     Cervical back: Normal range of motion and neck supple.  Skin:    General: Skin is warm.     Capillary Refill: Capillary refill takes less than 2 seconds.  Neurological:     General: No focal deficit present.     Mental Status: He is oriented to person, place, and time.  Psychiatric:        Mood and Affect: Mood normal.        Behavior: Behavior normal.     ED Results / Procedures / Treatments   Labs (all labs ordered are listed, but only abnormal results are displayed) Labs Reviewed  CBC WITH DIFFERENTIAL/PLATELET - Abnormal; Notable for the following components:      Result Value   Hemoglobin 12.6 (*)    HCT 38.4 (*)    RDW 19.1 (*)    All other components within normal limits  COMPREHENSIVE METABOLIC PANEL  ETHANOL    EKG EKG Interpretation  Date/Time:  Friday October 15 2021 18:18:32 EDT Ventricular  Rate:  88 PR Interval:  132 QRS Duration: 101 QT Interval:  383 QTC Calculation: 464 R Axis:   -19 Text Interpretation: Sinus rhythm Probable left atrial enlargement Borderline left axis deviation RSR' in V1 or V2, probably normal variant Borderline ST depression, lateral leads No significant change since last tracing Confirmed by Wandra Arthurs [phone]) on 10/15/2021 7:12:22 PM  Radiology No results found.  Procedures Procedures    Medications Ordered in ED Medications  hydrALAZINE (APRESOLINE) injection 10 mg (10 mg Intravenous Given 10/15/21 1855)  oxymetazoline (AFRIN) 0.05 % nasal spray 1 spray (1 spray Each Nare Given 10/15/21 1857)    ED Course/ Medical Decision Making/ A&P                           Medical Decision Making Dan Henry is a 71 y.o. male here presenting with hypertension, alcohol intoxication and nosebleed.  Patient has dried blood in the left nostril.  The bleeding is stopped already.  We will try to give some Afrin.  We will also check a CBC.  Patient has history of hypertension and did not take his blood pressure medicine. He has no chest pain or headache and has nonfocal neuro exam.  Plan to get CBC and CMP and alcohol level.  Will give hydralazine and reassess  8:04 PM I reviewed patient's labs.  Patient's alcohol level is 93.  His LFTs are normal.  Patient's hemoglobin is stable at 12.6.  Nurse was able to put Afrin into the left nostril and the bleeding is controlled.  I told him to use Afrin as needed.  Patient was given hydralazine and his blood pressure is down to 180 from 220.  I told him that he needs to take his blood pressure medicine as prescribed  Problems Addressed: Epistaxis: acute illness or injury Hypertension, unspecified type: acute illness or injury  Amount and/or Complexity of Data Reviewed Labs: ordered. Decision-making details documented in ED Course. Radiology: ordered and independent interpretation performed. Decision-making details  documented in ED Course. ECG/medicine tests: ordered and independent interpretation performed. Decision-making details documented in ED Course.  Risk OTC drugs. Prescription drug management.    Final Clinical Impression(s) / ED Diagnoses Final diagnoses:  None    Rx / DC Orders ED Discharge Orders     None         Drenda Freeze, MD 10/15/21 2015

## 2021-10-15 NOTE — Discharge Instructions (Addendum)
Take your blood pressure medicine as prescribed  If you have a nosebleed, you can use Afrin and then hold your nose for 30 minutes.  If it does not stop bleeding then please return to the ER for  Please avoid drinking alcohol  See your doctor for follow-up  Return to ER if you have worse nosebleed, chest pain, abdominal pain, headache

## 2021-10-15 NOTE — ED Notes (Signed)
Pt states understanding of dc instructions, importance of follow up. Pt denies questions or concerns and declined transportation assistance upon dc. Pt ambulated w/ a steady gait w/o need for assistance. No belongings left in room upon dc. ? ?

## 2022-02-14 ENCOUNTER — Encounter (HOSPITAL_COMMUNITY): Payer: Self-pay | Admitting: Emergency Medicine

## 2022-02-14 ENCOUNTER — Inpatient Hospital Stay (HOSPITAL_COMMUNITY)
Admission: EM | Admit: 2022-02-14 | Discharge: 2022-02-18 | DRG: 305 | Disposition: A | Discharge status: Home / Self Care | Payer: Medicare Other | Attending: Internal Medicine | Admitting: Internal Medicine

## 2022-02-14 DIAGNOSIS — R7989 Other specified abnormal findings of blood chemistry: Secondary | ICD-10-CM | POA: Diagnosis present

## 2022-02-14 DIAGNOSIS — N179 Acute kidney failure, unspecified: Secondary | ICD-10-CM | POA: Diagnosis present

## 2022-02-14 DIAGNOSIS — F1020 Alcohol dependence, uncomplicated: Secondary | ICD-10-CM | POA: Diagnosis present

## 2022-02-14 DIAGNOSIS — Z91148 Patient's other noncompliance with medication regimen for other reason: Secondary | ICD-10-CM

## 2022-02-14 DIAGNOSIS — D649 Anemia, unspecified: Secondary | ICD-10-CM | POA: Diagnosis present

## 2022-02-14 DIAGNOSIS — F10931 Alcohol use, unspecified with withdrawal delirium: Secondary | ICD-10-CM

## 2022-02-14 DIAGNOSIS — R509 Fever, unspecified: Secondary | ICD-10-CM | POA: Diagnosis not present

## 2022-02-14 DIAGNOSIS — S0990XA Unspecified injury of head, initial encounter: Secondary | ICD-10-CM

## 2022-02-14 DIAGNOSIS — K760 Fatty (change of) liver, not elsewhere classified: Secondary | ICD-10-CM | POA: Diagnosis present

## 2022-02-14 DIAGNOSIS — Z8249 Family history of ischemic heart disease and other diseases of the circulatory system: Secondary | ICD-10-CM

## 2022-02-14 DIAGNOSIS — R4189 Other symptoms and signs involving cognitive functions and awareness: Secondary | ICD-10-CM | POA: Diagnosis present

## 2022-02-14 DIAGNOSIS — W010XXA Fall on same level from slipping, tripping and stumbling without subsequent striking against object, initial encounter: Secondary | ICD-10-CM | POA: Diagnosis present

## 2022-02-14 DIAGNOSIS — D6489 Other specified anemias: Secondary | ICD-10-CM | POA: Diagnosis present

## 2022-02-14 DIAGNOSIS — I160 Hypertensive urgency: Secondary | ICD-10-CM | POA: Diagnosis present

## 2022-02-14 DIAGNOSIS — Z79899 Other long term (current) drug therapy: Secondary | ICD-10-CM

## 2022-02-14 DIAGNOSIS — S0081XA Abrasion of other part of head, initial encounter: Secondary | ICD-10-CM | POA: Diagnosis present

## 2022-02-14 DIAGNOSIS — D696 Thrombocytopenia, unspecified: Secondary | ICD-10-CM | POA: Diagnosis present

## 2022-02-14 DIAGNOSIS — R42 Dizziness and giddiness: Principal | ICD-10-CM

## 2022-02-14 DIAGNOSIS — Y9301 Activity, walking, marching and hiking: Secondary | ICD-10-CM | POA: Diagnosis present

## 2022-02-14 DIAGNOSIS — W19XXXA Unspecified fall, initial encounter: Secondary | ICD-10-CM

## 2022-02-14 DIAGNOSIS — F1010 Alcohol abuse, uncomplicated: Secondary | ICD-10-CM | POA: Diagnosis present

## 2022-02-14 DIAGNOSIS — I10 Essential (primary) hypertension: Secondary | ICD-10-CM

## 2022-02-14 DIAGNOSIS — R443 Hallucinations, unspecified: Secondary | ICD-10-CM | POA: Diagnosis not present

## 2022-02-14 DIAGNOSIS — E860 Dehydration: Secondary | ICD-10-CM | POA: Diagnosis present

## 2022-02-14 DIAGNOSIS — G250 Essential tremor: Secondary | ICD-10-CM | POA: Diagnosis present

## 2022-02-14 DIAGNOSIS — F1021 Alcohol dependence, in remission: Secondary | ICD-10-CM | POA: Diagnosis present

## 2022-02-14 DIAGNOSIS — Z1152 Encounter for screening for COVID-19: Secondary | ICD-10-CM

## 2022-02-14 DIAGNOSIS — I161 Hypertensive emergency: Principal | ICD-10-CM | POA: Diagnosis present

## 2022-02-14 DIAGNOSIS — E876 Hypokalemia: Secondary | ICD-10-CM | POA: Diagnosis present

## 2022-02-14 LAB — CBC WITH DIFFERENTIAL/PLATELET
Abs Immature Granulocytes: 0.04 10*3/uL (ref 0.00–0.07)
Basophils Absolute: 0 10*3/uL (ref 0.0–0.1)
Basophils Relative: 1 %
Eosinophils Absolute: 0 10*3/uL (ref 0.0–0.5)
Eosinophils Relative: 0 %
HCT: 36.9 % — ABNORMAL LOW (ref 39.0–52.0)
Hemoglobin: 12.2 g/dL — ABNORMAL LOW (ref 13.0–17.0)
Immature Granulocytes: 1 %
Lymphocytes Relative: 7 %
Lymphs Abs: 0.4 10*3/uL — ABNORMAL LOW (ref 0.7–4.0)
MCH: 27.1 pg (ref 26.0–34.0)
MCHC: 33.1 g/dL (ref 30.0–36.0)
MCV: 82 fL (ref 80.0–100.0)
Monocytes Absolute: 0.8 10*3/uL (ref 0.1–1.0)
Monocytes Relative: 12 %
Neutro Abs: 5.3 10*3/uL (ref 1.7–7.7)
Neutrophils Relative %: 79 %
Platelets: 158 10*3/uL (ref 150–400)
RBC: 4.5 MIL/uL (ref 4.22–5.81)
RDW: 19.1 % — ABNORMAL HIGH (ref 11.5–15.5)
WBC: 6.6 10*3/uL (ref 4.0–10.5)
nRBC: 0 % (ref 0.0–0.2)

## 2022-02-14 LAB — RESP PANEL BY RT-PCR (FLU A&B, COVID) ARPGX2
Influenza A by PCR: NEGATIVE
Influenza B by PCR: NEGATIVE
SARS Coronavirus 2 by RT PCR: NEGATIVE

## 2022-02-14 LAB — URINALYSIS, ROUTINE W REFLEX MICROSCOPIC
Bacteria, UA: NONE SEEN
Bilirubin Urine: NEGATIVE
Glucose, UA: NEGATIVE mg/dL
Hgb urine dipstick: NEGATIVE
Ketones, ur: 5 mg/dL — AB
Leukocytes,Ua: NEGATIVE
Nitrite: NEGATIVE
Protein, ur: >=300 mg/dL — AB
Specific Gravity, Urine: 1.018 (ref 1.005–1.030)
pH: 5 (ref 5.0–8.0)

## 2022-02-14 LAB — COMPREHENSIVE METABOLIC PANEL
ALT: 81 U/L — ABNORMAL HIGH (ref 0–44)
AST: 96 U/L — ABNORMAL HIGH (ref 15–41)
Albumin: 4.3 g/dL (ref 3.5–5.0)
Alkaline Phosphatase: 54 U/L (ref 38–126)
Anion gap: 12 (ref 5–15)
BUN: 9 mg/dL (ref 8–23)
CO2: 24 mmol/L (ref 22–32)
Calcium: 9.3 mg/dL (ref 8.9–10.3)
Chloride: 105 mmol/L (ref 98–111)
Creatinine, Ser: 1.14 mg/dL (ref 0.61–1.24)
GFR, Estimated: >60 mL/min (ref >60)
Glucose, Bld: 157 mg/dL — ABNORMAL HIGH (ref 70–99)
Potassium: 3.7 mmol/L (ref 3.5–5.1)
Sodium: 141 mmol/L (ref 135–145)
Total Bilirubin: 1.2 mg/dL (ref 0.3–1.2)
Total Protein: 8.5 g/dL — ABNORMAL HIGH (ref 6.5–8.1)

## 2022-02-14 LAB — CBG MONITORING, ED: Glucose-Capillary: 125 mg/dL — ABNORMAL HIGH (ref 70–99)

## 2022-02-14 NOTE — ED Provider Triage Note (Signed)
Emergency Medicine Provider Triage Evaluation Note  Dan Henry , a 71 y.o. male  was evaluated in triage.  Pt complains of generally feeling unwell.  Patient states that earlier today he was walking outside of a store when he began feeling shaky and weak.  He states that he then broke out in a sweat and felt feverish.  He states that his symptoms gradually improved since then.  He denies chest pain, shortness of breath, abdominal pain, nausea, vomiting or diarrhea..  Review of Systems  Positive: See above Negative:   Physical Exam  There were no vitals taken for this visit. Gen:   Awake, no distress   Resp:  Normal effort, lungs clear bilaterally MSK:   Moves extremities without difficulty  Other:  Abdomen is rounded, soft, mildly tender to palpation.  Medical Decision Making  Medically screening exam initiated at 3:40 PM.  Appropriate orders placed.  Kylo Gavin was informed that the remainder of the evaluation will be completed by another provider, this initial triage assessment does not replace that evaluation, and the importance of remaining in the ED until their evaluation is complete.     Mickie Hillier, PA-C 02/14/22 1542

## 2022-02-14 NOTE — ED Triage Notes (Signed)
Patient BIB [facility] from a convenience store for evaluation of diaphoresis. Patient complains of a headache, is alert, oriented, and in no apparent distress at this time. 20g saline lock in left hand. EMS states patient was dry when they found him in the [place] store but became diaphoretic again after walking approximately fifteen yards to ambulance.  EMS Vitals BP 220/120 HR 100 RR 20 99% on room air CBG 164

## 2022-02-14 NOTE — ED Notes (Signed)
Patient approached nurses station in green zone after entering through open door. Patient requested something to drink from secretary who explained patient remains NPO at this time. Patient was unclear on where he was and what was going on. Charge and triage RN made aware.

## 2022-02-15 ENCOUNTER — Emergency Department (HOSPITAL_COMMUNITY): Payer: Medicare Other

## 2022-02-15 ENCOUNTER — Encounter (HOSPITAL_COMMUNITY): Payer: Self-pay | Admitting: Student

## 2022-02-15 DIAGNOSIS — I161 Hypertensive emergency: Secondary | ICD-10-CM | POA: Diagnosis present

## 2022-02-15 DIAGNOSIS — G3184 Mild cognitive impairment, so stated: Secondary | ICD-10-CM | POA: Diagnosis not present

## 2022-02-15 DIAGNOSIS — E860 Dehydration: Secondary | ICD-10-CM | POA: Diagnosis present

## 2022-02-15 DIAGNOSIS — Z1152 Encounter for screening for COVID-19: Secondary | ICD-10-CM | POA: Diagnosis not present

## 2022-02-15 DIAGNOSIS — R7989 Other specified abnormal findings of blood chemistry: Secondary | ICD-10-CM | POA: Diagnosis present

## 2022-02-15 DIAGNOSIS — G250 Essential tremor: Secondary | ICD-10-CM | POA: Diagnosis present

## 2022-02-15 DIAGNOSIS — E876 Hypokalemia: Secondary | ICD-10-CM | POA: Diagnosis present

## 2022-02-15 DIAGNOSIS — Z79899 Other long term (current) drug therapy: Secondary | ICD-10-CM | POA: Diagnosis not present

## 2022-02-15 DIAGNOSIS — F1010 Alcohol abuse, uncomplicated: Secondary | ICD-10-CM | POA: Diagnosis present

## 2022-02-15 DIAGNOSIS — S0081XA Abrasion of other part of head, initial encounter: Secondary | ICD-10-CM | POA: Diagnosis present

## 2022-02-15 DIAGNOSIS — R42 Dizziness and giddiness: Secondary | ICD-10-CM | POA: Diagnosis present

## 2022-02-15 DIAGNOSIS — Z91148 Patient's other noncompliance with medication regimen for other reason: Secondary | ICD-10-CM | POA: Diagnosis not present

## 2022-02-15 DIAGNOSIS — D6489 Other specified anemias: Secondary | ICD-10-CM | POA: Diagnosis present

## 2022-02-15 DIAGNOSIS — N179 Acute kidney failure, unspecified: Secondary | ICD-10-CM | POA: Diagnosis present

## 2022-02-15 DIAGNOSIS — D696 Thrombocytopenia, unspecified: Secondary | ICD-10-CM | POA: Diagnosis present

## 2022-02-15 DIAGNOSIS — R251 Tremor, unspecified: Secondary | ICD-10-CM | POA: Diagnosis not present

## 2022-02-15 DIAGNOSIS — R443 Hallucinations, unspecified: Secondary | ICD-10-CM | POA: Diagnosis not present

## 2022-02-15 DIAGNOSIS — W010XXA Fall on same level from slipping, tripping and stumbling without subsequent striking against object, initial encounter: Secondary | ICD-10-CM | POA: Diagnosis present

## 2022-02-15 DIAGNOSIS — R509 Fever, unspecified: Secondary | ICD-10-CM | POA: Diagnosis not present

## 2022-02-15 DIAGNOSIS — Z8249 Family history of ischemic heart disease and other diseases of the circulatory system: Secondary | ICD-10-CM | POA: Diagnosis not present

## 2022-02-15 DIAGNOSIS — I160 Hypertensive urgency: Secondary | ICD-10-CM | POA: Diagnosis present

## 2022-02-15 DIAGNOSIS — K760 Fatty (change of) liver, not elsewhere classified: Secondary | ICD-10-CM | POA: Diagnosis present

## 2022-02-15 DIAGNOSIS — F1090 Alcohol use, unspecified, uncomplicated: Secondary | ICD-10-CM | POA: Diagnosis not present

## 2022-02-15 DIAGNOSIS — R4189 Other symptoms and signs involving cognitive functions and awareness: Secondary | ICD-10-CM | POA: Diagnosis present

## 2022-02-15 DIAGNOSIS — Y9301 Activity, walking, marching and hiking: Secondary | ICD-10-CM | POA: Diagnosis present

## 2022-02-15 LAB — COMPREHENSIVE METABOLIC PANEL
ALT: 77 U/L — ABNORMAL HIGH (ref 0–44)
AST: 88 U/L — ABNORMAL HIGH (ref 15–41)
Albumin: 4.2 g/dL (ref 3.5–5.0)
Alkaline Phosphatase: 51 U/L (ref 38–126)
Anion gap: 17 — ABNORMAL HIGH (ref 5–15)
BUN: 16 mg/dL (ref 8–23)
CO2: 20 mmol/L — ABNORMAL LOW (ref 22–32)
Calcium: 9.6 mg/dL (ref 8.9–10.3)
Chloride: 105 mmol/L (ref 98–111)
Creatinine, Ser: 1.39 mg/dL — ABNORMAL HIGH (ref 0.61–1.24)
GFR, Estimated: 54 mL/min — ABNORMAL LOW (ref >60)
Glucose, Bld: 135 mg/dL — ABNORMAL HIGH (ref 70–99)
Potassium: 3.6 mmol/L (ref 3.5–5.1)
Sodium: 142 mmol/L (ref 135–145)
Total Bilirubin: 1.3 mg/dL — ABNORMAL HIGH (ref 0.3–1.2)
Total Protein: 8.4 g/dL — ABNORMAL HIGH (ref 6.5–8.1)

## 2022-02-15 LAB — LACTIC ACID, PLASMA: Lactic Acid, Venous: 1.9 mmol/L (ref 0.5–1.9)

## 2022-02-15 LAB — CBC WITH DIFFERENTIAL/PLATELET
Abs Immature Granulocytes: 0.06 10*3/uL (ref 0.00–0.07)
Basophils Absolute: 0 10*3/uL (ref 0.0–0.1)
Basophils Relative: 1 %
Eosinophils Absolute: 0.2 10*3/uL (ref 0.0–0.5)
Eosinophils Relative: 3 %
HCT: 37.3 % — ABNORMAL LOW (ref 39.0–52.0)
Hemoglobin: 12.3 g/dL — ABNORMAL LOW (ref 13.0–17.0)
Immature Granulocytes: 1 %
Lymphocytes Relative: 9 %
Lymphs Abs: 0.6 10*3/uL — ABNORMAL LOW (ref 0.7–4.0)
MCH: 27.9 pg (ref 26.0–34.0)
MCHC: 33 g/dL (ref 30.0–36.0)
MCV: 84.6 fL (ref 80.0–100.0)
Monocytes Absolute: 0.9 10*3/uL (ref 0.1–1.0)
Monocytes Relative: 14 %
Neutro Abs: 4.5 10*3/uL (ref 1.7–7.7)
Neutrophils Relative %: 72 %
Platelets: 148 10*3/uL — ABNORMAL LOW (ref 150–400)
RBC: 4.41 MIL/uL (ref 4.22–5.81)
RDW: 19.4 % — ABNORMAL HIGH (ref 11.5–15.5)
WBC: 6.2 10*3/uL (ref 4.0–10.5)
nRBC: 0 % (ref 0.0–0.2)

## 2022-02-15 LAB — PROTIME-INR
INR: 1.2 (ref 0.8–1.2)
Prothrombin Time: 15 seconds (ref 11.4–15.2)

## 2022-02-15 LAB — TROPONIN I (HIGH SENSITIVITY)
Troponin I (High Sensitivity): 23 ng/L — ABNORMAL HIGH (ref <18)
Troponin I (High Sensitivity): 34 ng/L — ABNORMAL HIGH (ref <18)
Troponin I (High Sensitivity): 34 ng/L — ABNORMAL HIGH (ref <18)

## 2022-02-15 LAB — CBG MONITORING, ED: Glucose-Capillary: 92 mg/dL (ref 70–99)

## 2022-02-15 LAB — AMMONIA: Ammonia: 24 umol/L (ref 9–35)

## 2022-02-15 LAB — BRAIN NATRIURETIC PEPTIDE: B Natriuretic Peptide: 145.7 pg/mL — ABNORMAL HIGH (ref 0.0–100.0)

## 2022-02-15 LAB — ETHANOL: Alcohol, Ethyl (B): <10 mg/dL (ref <10)

## 2022-02-15 MED ORDER — IRBESARTAN 300 MG PO TABS: 300.0000 mg | ORAL_TABLET | Freq: Every day | ORAL | Status: DC

## 2022-02-15 MED ORDER — IRBESARTAN 300 MG PO TABS
300.0000 mg | ORAL_TABLET | Freq: Every day | ORAL | Status: DC
  Administered 2022-02-15 – 2022-02-18 (×4): 300 mg via ORAL
  Filled 2022-02-15 (×4): qty 1

## 2022-02-15 MED ORDER — FOLIC ACID 1 MG PO TABS: 1.0000 mg | ORAL_TABLET | Freq: Every day | ORAL | Status: DC

## 2022-02-15 MED ORDER — OXYCODONE HCL 5 MG PO TABS: 5.0000 mg | ORAL_TABLET | Freq: Once | ORAL | Status: DC

## 2022-02-15 MED ORDER — THIAMINE MONONITRATE 100 MG PO TABS: 100.0000 mg | ORAL_TABLET | Freq: Every day | ORAL | Status: DC

## 2022-02-15 MED ORDER — THIAMINE HCL 100 MG/ML IJ SOLN
100.0000 mg | Freq: Once | INTRAMUSCULAR | Status: AC
  Administered 2022-02-15: 100 mg via INTRAVENOUS
  Filled 2022-02-15: qty 2

## 2022-02-15 MED ORDER — METOPROLOL TARTRATE 25 MG PO TABS
25.0000 mg | ORAL_TABLET | Freq: Two times a day (BID) | ORAL | Status: DC
Start: 2022-02-15 — End: 2022-02-15

## 2022-02-15 MED ORDER — OLMESARTAN-AMLODIPINE-HCTZ 40-10-12.5 MG PO TABS: 1.0000 | ORAL_TABLET | Freq: Every day | ORAL | Status: DC

## 2022-02-15 MED ORDER — HYDROCHLOROTHIAZIDE 12.5 MG PO TABS
12.5000 mg | ORAL_TABLET | Freq: Every day | ORAL | Status: DC
  Administered 2022-02-15 – 2022-02-18 (×4): 12.5 mg via ORAL
  Filled 2022-02-15 (×4): qty 1

## 2022-02-15 MED ORDER — LACTATED RINGERS IV BOLUS
1000.0000 mL | Freq: Once | INTRAVENOUS | Status: AC
  Administered 2022-02-15: 1000 mL via INTRAVENOUS

## 2022-02-15 MED ORDER — AMLODIPINE BESYLATE 5 MG PO TABS: 10.0000 mg | ORAL_TABLET | Freq: Every day | ORAL | Status: DC

## 2022-02-15 MED ORDER — FOLIC ACID 1 MG PO TABS
1.0000 mg | ORAL_TABLET | Freq: Every day | ORAL | Status: DC
  Administered 2022-02-15 – 2022-02-18 (×4): 1 mg via ORAL
  Filled 2022-02-15 (×4): qty 1

## 2022-02-15 MED ORDER — THIAMINE MONONITRATE 100 MG PO TABS
100.0000 mg | ORAL_TABLET | Freq: Every day | ORAL | Status: DC
  Administered 2022-02-16: 100 mg via ORAL
  Filled 2022-02-15 (×2): qty 1

## 2022-02-15 MED ORDER — HYDROCHLOROTHIAZIDE 12.5 MG PO TABS: 12.5000 mg | ORAL_TABLET | Freq: Every day | ORAL | Status: DC

## 2022-02-15 MED ORDER — ADULT MULTIVITAMIN W/MINERALS CH
1.0000 | ORAL_TABLET | Freq: Every day | ORAL | Status: DC
  Administered 2022-02-15 – 2022-02-18 (×4): 1 via ORAL
  Filled 2022-02-15 (×4): qty 1

## 2022-02-15 MED ORDER — METOPROLOL TARTRATE 25 MG PO TABS
25.0000 mg | ORAL_TABLET | Freq: Two times a day (BID) | ORAL | Status: DC
  Administered 2022-02-15 – 2022-02-18 (×7): 25 mg via ORAL
  Filled 2022-02-15 (×7): qty 1

## 2022-02-15 MED ORDER — AMLODIPINE BESYLATE 10 MG PO TABS
10.0000 mg | ORAL_TABLET | Freq: Every day | ORAL | Status: DC
  Administered 2022-02-15 – 2022-02-18 (×4): 10 mg via ORAL
  Filled 2022-02-15: qty 2
  Filled 2022-02-15 (×3): qty 1

## 2022-02-15 MED ORDER — ENOXAPARIN SODIUM 40 MG/0.4ML IJ SOSY
40.0000 mg | PREFILLED_SYRINGE | INTRAMUSCULAR | Status: DC
  Administered 2022-02-15 – 2022-02-18 (×4): 40 mg via SUBCUTANEOUS
  Filled 2022-02-15 (×4): qty 0.4

## 2022-02-15 MED ORDER — ENSURE ENLIVE PO LIQD
237.0000 mL | Freq: Three times a day (TID) | ORAL | Status: DC
  Administered 2022-02-15 – 2022-02-18 (×9): 237 mL via ORAL
  Filled 2022-02-15: qty 237

## 2022-02-15 MED ORDER — SODIUM CHLORIDE 0.9 % IV BOLUS
1000.0000 mL | Freq: Once | INTRAVENOUS | Status: AC
  Administered 2022-02-15: 1000 mL via INTRAVENOUS

## 2022-02-15 NOTE — Progress Notes (Signed)
NEW ADMISSION NOTE New Admission Note:   Arrival Method: stretcher Mental Orientation: A&OX2 Telemetry:5M11 Assessment: Completed Skin: intact scratch marks on shoulders, skin dry flaky, feet cracked IV:LFA Pain:0/10 Tubes:NONE Safety Measures: Safety Fall Prevention Plan has been given, discussed and signed Admission: Completed [facility] Orientation: Patient has been orientated to the room, unit and staff.  Family: NONE AT BEDSIDE  Orders have been reviewed and implemented. Will continue to monitor the patient. Call light has been placed within reach and bed alarm has been activated.   Ricardo Kayes S Debborah Alonge, RN

## 2022-02-15 NOTE — ED Notes (Signed)
Apparently the patient had left after triage. He now arrives back to the ER via [facility] with c/o fall after tripping and falling down hitting his head onto the sidewalk. No LOC. Hematoma to left side of his forehead with an abrasion. A&OX4. He is unsure if is on blood thinners.  Initial BP 270/120 but then spontaneously decreased to 195/123, HR-110, 12 lead shows depression to 2 3 AVF, V4 and V5. No elevation noted. Denies chest pain/sob.  EMS reports when they approached the patient he still had an IV in his left hand, presumably from earlier visit here and they removed and started a new IV, 20g to LFA.

## 2022-02-15 NOTE — ED Notes (Signed)
X1 no response for vitals recheck 

## 2022-02-15 NOTE — ED Notes (Signed)
Patient denies pain and is resting comfortably. Expresses no needs. NAD.

## 2022-02-15 NOTE — ED Notes (Signed)
Patient noted to be trembling/shaky. Offered a blanket, pt refused.

## 2022-02-15 NOTE — ED Provider Notes (Signed)
[facility] EMERGENCY DEPARTMENT  Provider Note  CSN: [id number] Arrival date & time: 02/14/22 1506  History Chief Complaint  Patient presents with   Weakness   Fall    Dan Henry is a 71 y.o. male with history of EtOH abuse, reports he hasn't had any alcohol in about 3 weeks. He reports he was at the store earlier and felt shaky and weak. EMS brought him to the ED where he was seen in triage and noted to be diaphoretic and feverish. He had labs done but left from the waiting room around 10pm. Apparently he fell while trying to walk home and hit his head. EMS was called again and noted him to have BP of 270. Patient is not clear on the details, tells me he took a taxi home, but that doesn't appear to be the case. His BP was improved on arrival here.   Home Medications Prior to Admission medications   Medication Sig Start Date End Date Taking? Authorizing Provider  feeding supplement (ENSURE ENLIVE / ENSURE PLUS) LIQD Take 237 mLs by mouth 3 (three) times daily between meals. 02/13/20  Yes Cato Mulligan, MD  folic acid (FOLVITE) 1 MG tablet Take 1 tablet (1 mg total) by mouth daily. 02/20/20  Yes Aslam, Loralyn Freshwater, MD  metoprolol tartrate (LOPRESSOR) 25 MG tablet Take 1 tablet (25 mg total) by mouth 2 (two) times daily. 06/10/20 02/16/23 Yes Jose Persia, MD  Multiple Vitamin (MULTIVITAMIN WITH MINERALS) TABS tablet Take 1 tablet by mouth every morning.   Yes [provider]  nitroGLYCERIN (NITROSTAT) 0.4 MG SL tablet Place 1 tablet (0.4 mg total) under the tongue every 5 (five) minutes as needed for chest pain. 06/10/20  Yes Jose Persia, MD  Olmesartan-amLODIPine-HCTZ 40-10-12.5 MG TABS Take 1 tablet by mouth daily. 08/10/20  Yes [name], MD  simvastatin (ZOCOR) 10 MG tablet Take 10 mg by mouth at bedtime. 10/12/21  Yes [provider]  thiamine 100 MG tablet Take 1 tablet (100 mg total) by mouth daily. 06/10/20  Yes Jose Persia, MD   naltrexone (DEPADE) 50 MG tablet Take 1 tablet (50 mg total) by mouth daily. Patient not taking: Reported on 02/15/2022 06/10/20   Jose Persia, MD  QUEtiapine (SEROQUEL) 25 MG tablet Take 1 tablet (25 mg total) by mouth at bedtime. Patient not taking: Reported on 02/15/2022 10/18/20   Arnaldo Natal, MD     Allergies    Patient has no known allergies.   Review of Systems   Review of Systems Please see HPI for pertinent positives and negatives  Physical Exam BP (!) 183/107 (BP Location: Left Arm)   Pulse (!) 103   Temp 99.3 F (37.4 C) (Oral)   Resp 16   SpO2 100%   Physical Exam Vitals and nursing note reviewed.  Constitutional:      Appearance: Normal appearance.  HENT:     Head: Normocephalic.     Comments: Hematoma L forehead    Nose: Nose normal.     Mouth/Throat:     Mouth: Mucous membranes are moist.  Eyes:     Extraocular Movements: Extraocular movements intact.     Conjunctiva/sclera: Conjunctivae normal.  Cardiovascular:     Rate and Rhythm: Normal rate.  Pulmonary:     Effort: Pulmonary effort is normal.     Breath sounds: Normal breath sounds. No wheezing or rales.  Abdominal:     General: Abdomen is flat.     Palpations: Abdomen is  soft.     Tenderness: There is no abdominal tenderness. There is no guarding.  Musculoskeletal:        General: No swelling. Normal range of motion.     Cervical back: Neck supple.  Skin:    General: Skin is warm and dry.  Neurological:     General: No focal deficit present.     Mental Status: He is alert and oriented to person, place, and time.  Psychiatric:        Mood and Affect: Mood normal.     ED Results / Procedures / Treatments   EKG EKG Interpretation  Date/Time:  Tuesday February 15 2022 02:05:32 EDT Ventricular Rate:  119 PR Interval:  126 QRS Duration: 84 QT Interval:  342 QTC Calculation: 481 R Axis:   32 Text Interpretation: Sinus tachycardia Moderate voltage criteria for LVH, may be normal  variant ( Sokolow-Lyon , Cornell product ) Nonspecific ST abnormality Abnormal ECG When compared with ECG of 14-Feb-2022 15:55, Rate faster Confirmed by Calvert Cantor [phone]) on 02/15/2022 5:22:10 AM  Procedures Procedures  Medications Ordered in the ED Medications  metoprolol tartrate (LOPRESSOR) tablet 25 mg (has no administration in time range)  hydrochlorothiazide (HYDRODIURIL) tablet 12.5 mg (has no administration in time range)    And  amLODipine (NORVASC) tablet 10 mg (has no administration in time range)    And  irbesartan (AVAPRO) tablet 300 mg (has no administration in time range)  sodium chloride 0.9 % bolus 1,000 mL (0 mLs Intravenous Stopped 02/15/22 0654)  thiamine (VITAMIN B1) injection 100 mg (100 mg Intravenous Given 02/15/22 0545)     Initial Impression and Plan  Patient here with his second visit, did not get taken out of the trackboard when he left earlier to both visit are in one encounter in [place]. His labs from first visit showed CBC with anemia, at baseline. CMP with mildly elevated LFTs at baseline, UA was clear. On second visit labs showed unchanged CBC, CMP with mild increase in Cr, Trop added is borderline elevated, will check delta. Plan IVF for possible dehydration. Add EtOH given history of abuse.   ED Course   Clinical Course as of 02/15/22 8546  Tue Feb 15, 2022  0531 I personally viewed the images from radiology studies and agree with radiologist interpretation:  CT neg for acute injury.  [CS]  2703 EtOH is neg. Patient is sitting up, drinking gingerale in no distress. Reports he continues to feel better. Awaiting repeat Trop.  [CS]  [phone] Patient's second trop is increased some from initial. Patient is currently asymptomatic. Likely due to reported HTN with EMS, will restart his listed home meds. Will discuss admission with the IM resident.  [CS]  0710 Spoke with the IM resident on call who will come evaluate the patient.  [CS]    Clinical Course  User Index [CS] Truddie Hidden, MD     MDM Rules/Calculators/A&P Medical Decision Making Problems Addressed: Dizziness: acute illness or injury Elevated troponin: acute illness or injury Fall, initial encounter: acute illness or injury Injury of head, initial encounter: acute illness or injury Uncontrolled hypertension: chronic illness or injury with exacerbation, progression, or side effects of treatment  Amount and/or Complexity of Data Reviewed Labs: ordered. Decision-making details documented in ED Course. Radiology: ordered and independent interpretation performed. Decision-making details documented in ED Course. ECG/medicine tests: ordered and independent interpretation performed. Decision-making details documented in ED Course.  Risk Prescription drug management. Decision regarding hospitalization.    Final Clinical  Impression(s) / ED Diagnoses Final diagnoses:  Dizziness  Fall, initial encounter  Injury of head, initial encounter  Elevated troponin  Uncontrolled hypertension    Rx / DC Orders ED Discharge Orders     None        Truddie Hidden, MD 02/15/22 [phone]

## 2022-02-15 NOTE — Hospital Course (Addendum)
Lightheaded, dizzy, shaky Left AMA Walk home, fell, bumped head  EMS > BP 270  _____________________________________________  10/18 Trying to get his medications from [facility]. He is oriented to place, city, year, president. He is not oriented to year. He denies auditory or visual hallucinations. Reports he had a nightmare yesterday. He denies chest pain. Reports his last drink was 2 months ago. Discussed concern about alcohol withdrawal. Reports his last alcohol withdrawal was a month ago. He is unable to recall history of seizures or ICU admission for alcohol withdrawal. Discussed plan to treat HTN. He reports can call contact Leim Fabry.  -put in order for PT (imminent discharge)  F/u Dr. Collene Gobble 0845 on 10/30  10/19 Felt sick last night. Reports chills, sweats, tremors. Reports felt bad for 30 minutes. Denies cough, sore throat, emesis, abdominal pain. Some chest pain. Reports polyuria since 30 min ago. Denies skin rash. Reports he will stay with [facility] at discharge. Does not need help with iADLs (takes medications on his own).   10-20: Patient feeling fine this morning, reports some mild abdominal discomfort. Denies fever, chills, chest pain, and breath shortness. Confirmed that patient has a place to stay, with his friend, after discharge. Discussed plan to discharge today and importance of home health with physical therapy.

## 2022-02-15 NOTE — ED Notes (Signed)
Patient transported to CT 

## 2022-02-15 NOTE — ED Provider Triage Note (Signed)
Emergency Medicine Provider Triage Evaluation Note  Dan Henry , a 71 y.o. male  was evaluated in triage.  Pt complains of fall.  Patient well-appearing here with complaints of diaphoresis and weakness.  He had some labs ordered, and decided to leave prior to being seen.  When he had tripped and fallen and hit his the front of his head.  When EMS arrived, his blood pressure initially was 270/120 however but then it gradually decreased with EMS.  He was noted to have a hematoma to his forehead and a small abrasion.  He denies any neck pain, chest pain, shortness of breath surrounding the fall.  He states that he thought he might of been taking a blood thinner but cannot remember which one, per chart review, does not appear to be on any systemic anticoagulation.  He states that he is supposed to take his blood pressure medicine but has not taken it since yesterday.  He denies any pain at present. He reports ongoing weakness and diaphoresis which is what initially brought him here   Review of Systems  Positive: As above  Negative: As above   Physical Exam  BP (!) 167/104   Pulse (!) 116   Temp 99.2 F (37.3 C) (Oral)   Resp 16   SpO2 96%  Gen:   Awake, no distress   Resp:  Normal effort  MSK:   Moves extremities without difficulty  Other:  Hematoma to forehead, moving all 4 extremities, no midline cervical tenderness   Medical Decision Making  Medically screening exam initiated at 3:01 AM.  Appropriate orders placed.  Payson Evrard was informed that the remainder of the evaluation will be completed by another provider, this initial triage assessment does not replace that evaluation, and the importance of remaining in the ED until their evaluation is complete.  Repeat labs, CT head  and cervical spine ordered    Garald Balding, PA-C 02/15/22 0304

## 2022-02-15 NOTE — ED Notes (Signed)
ED TO INPATIENT HANDOFF REPORT  ED Nurse Name and Phone #: Iona Coach Name/Age/Gender Dan Henry 71 y.o. male Room/Bed: H018C/H018C  Code Status   Code Status: Full Code  Home/SNF/Other Home Patient oriented to: self, place, time, and situation Is this baseline? Yes   Triage Complete: Triage complete  Chief Complaint Hypertensive urgency [I16.0] Hypertensive emergency [I16.1]  Triage Note Patient BIB [facility] a [facility] store for evaluation of diaphoresis. Patient complains of a headache, is alert, oriented, and in no apparent distress at this time. 20g saline lock in left hand. EMS states patient was dry when they found him in the [place] store but became diaphoretic again after walking approximately fifteen yards to ambulance.  EMS Vitals BP 220/120 HR 100 RR 20 99% on room air CBG 164   Allergies No Known Allergies  Level of Care/Admitting Diagnosis ED Disposition     ED Disposition  Admit   Condition  --   Comment  Hospital Area: [facility] [[id number]]  Level of Care: Telemetry Medical [[id number]]  May admit patient to [facility] or [facility] if equivalent level of care is available:: No  Covid Evaluation: Asymptomatic - no recent exposure (last 10 days) testing not required  Diagnosis: Hypertensive emergency [phone]  Admitting Physician: Aldine Contes [[id number]]  Attending Physician: Aldine Contes [[id number]]  Certification:: I certify this patient will need inpatient services for at least 2 midnights          B Medical/Surgery History Past Medical History:  Diagnosis Date   Acute kidney injury ([place]) 05/08/2017   Alcohol intoxication ([place]) 11/18/2019   Alcohol withdrawal ([place]) 04/23/2019   Alcohol withdrawal delirium ([place]) 02/14/2019   Hypertension    Hypokalemia 07/19/2017   Lactic acidosis 02/20/2018   Malnutrition of moderate degree 10/24/2018   Nosebleed 04/23/2019   Rhabdomyolysis 10/26/2018   Past Surgical  History:  Procedure Laterality Date   NO PAST SURGERIES       A IV Location/Drains/Wounds Patient Lines/Drains/Airways Status     Active Line/Drains/Airways     Name Placement date Placement time Site Days   Peripheral IV 02/14/22 20 G Left Forearm 02/14/22  --  Forearm  1            Intake/Output Last 24 hours  Intake/Output Summary (Last 24 hours) at 02/15/2022 1807 Last data filed at 02/15/2022 0654 Gross per 24 hour  Intake 1000 ml  Output --  Net 1000 ml    Labs/Imaging Results for orders placed or performed during the hospital encounter of 02/14/22 (from the past 48 hour(s))  Comprehensive metabolic panel     Status: Abnormal   Collection Time: 02/14/22  3:43 PM  Result Value Ref Range   Sodium 141 135 - 145 mmol/L   Potassium 3.7 3.5 - 5.1 mmol/L   Chloride 105 98 - 111 mmol/L   CO2 24 22 - 32 mmol/L   Glucose, Bld 157 (H) 70 - 99 mg/dL    Comment: Glucose reference range applies only to samples taken after fasting for at least 8 hours.   BUN 9 8 - 23 mg/dL   Creatinine, Ser 1.14 0.61 - 1.24 mg/dL   Calcium 9.3 8.9 - 10.3 mg/dL   Total Protein 8.5 (H) 6.5 - 8.1 g/dL   Albumin 4.3 3.5 - 5.0 g/dL   AST 96 (H) 15 - 41 U/L   ALT 81 (H) 0 - 44 U/L   Alkaline Phosphatase 54 38 - 126 U/L   Total  Bilirubin 1.2 0.3 - 1.2 mg/dL   GFR, Estimated >60 >60 mL/min    Comment: (NOTE) Calculated using the CKD-EPI Creatinine Equation (2021)    Anion gap 12 5 - 15    Comment: Performed at [facility] [address], [place], [place] [address]  CBC with Differential     Status: Abnormal   Collection Time: 02/14/22  3:43 PM  Result Value Ref Range   WBC 6.6 4.0 - 10.5 K/uL   RBC 4.50 4.22 - 5.81 MIL/uL   Hemoglobin 12.2 (L) 13.0 - 17.0 g/dL   HCT 36.9 (L) 39.0 - 52.0 %   MCV 82.0 80.0 - 100.0 fL   MCH 27.1 26.0 - 34.0 pg   MCHC 33.1 30.0 - 36.0 g/dL   RDW 19.1 (H) 11.5 - 15.5 %   Platelets 158 150 - 400 K/uL   nRBC 0.0 0.0 - 0.2 %   Neutrophils Relative  % 79 %   Neutro Abs 5.3 1.7 - 7.7 K/uL   Lymphocytes Relative 7 %   Lymphs Abs 0.4 (L) 0.7 - 4.0 K/uL   Monocytes Relative 12 %   Monocytes Absolute 0.8 0.1 - 1.0 K/uL   Eosinophils Relative 0 %   Eosinophils Absolute 0.0 0.0 - 0.5 K/uL   Basophils Relative 1 %   Basophils Absolute 0.0 0.0 - 0.1 K/uL   Immature Granulocytes 1 %   Abs Immature Granulocytes 0.04 0.00 - 0.07 K/uL    Comment: Performed at [facility], [place] [address] [place], [place] [address]  Urinalysis, Routine w reflex microscopic Urine, Clean Catch     Status: Abnormal   Collection Time: 02/14/22  3:43 PM  Result Value Ref Range   Color, Urine YELLOW YELLOW   APPearance HAZY (A) CLEAR   Specific Gravity, Urine 1.018 1.005 - 1.030   pH 5.0 5.0 - 8.0   Glucose, UA NEGATIVE NEGATIVE mg/dL   Hgb urine dipstick NEGATIVE NEGATIVE   Bilirubin Urine NEGATIVE NEGATIVE   Ketones, ur 5 (A) NEGATIVE mg/dL   Protein, ur >=300 (A) NEGATIVE mg/dL   Nitrite NEGATIVE NEGATIVE   Leukocytes,Ua NEGATIVE NEGATIVE   RBC / HPF 0-5 0 - 5 RBC/hpf   WBC, UA 0-5 0 - 5 WBC/hpf   Bacteria, UA NONE SEEN NONE SEEN   Squamous Epithelial / LPF 0-5 0 - 5   Mucus PRESENT     Comment: Performed at [facility], [place] [address], [place], [place] [address]  Resp Panel by RT-PCR (Flu A&B, Covid) Anterior Nasal Swab     Status: None   Collection Time: 02/14/22  3:43 PM   Specimen: Anterior Nasal Swab  Result Value Ref Range   SARS Coronavirus 2 by RT PCR NEGATIVE NEGATIVE    Comment: (NOTE) SARS-CoV-2 target nucleic acids are NOT DETECTED.  The SARS-CoV-2 RNA is generally detectable in upper respiratory specimens during the acute phase of infection. The lowest concentration of SARS-CoV-2 viral copies this assay can detect is 138 copies/mL. A negative result does not preclude SARS-Cov-2 infection and should not be used as the sole basis for treatment or other patient management decisions. A negative result may occur with  improper  specimen collection/handling, submission of specimen other than nasopharyngeal swab, presence of viral mutation(s) within the areas targeted by this assay, and inadequate number of viral copies(<138 copies/mL). A negative result must be combined with clinical observations, patient history, and epidemiological information. The expected result is Negative.  Fact Sheet  for Patients:  EntrepreneurPulse.com.au  Fact Sheet for Healthcare Providers:  IncredibleEmployment.be  This test is no t yet approved or cleared by the [place] FDA and  has been authorized for detection and/or diagnosis of SARS-CoV-2 by FDA under an Emergency Use Authorization (EUA). This EUA will remain  in effect (meaning this test can be used) for the duration of the COVID-19 declaration under Section 564(b)(1) of the Act, 21 U.S.C.section 360bbb-3(b)(1), unless the authorization is terminated  or revoked sooner.       Influenza A by PCR NEGATIVE NEGATIVE   Influenza B by PCR NEGATIVE NEGATIVE    Comment: (NOTE) The Xpert Xpress SARS-CoV-2/FLU/RSV plus assay is intended as an aid in the diagnosis of influenza from Nasopharyngeal swab specimens and should not be used as a sole basis for treatment. Nasal washings and aspirates are unacceptable for Xpert Xpress SARS-CoV-2/FLU/RSV testing.  Fact Sheet for Patients: EntrepreneurPulse.com.au  Fact Sheet for Healthcare Providers: IncredibleEmployment.be  This test is not yet approved or cleared by the [place] FDA and has been authorized for detection and/or diagnosis of SARS-CoV-2 by FDA under an Emergency Use Authorization (EUA). This EUA will remain in effect (meaning this test can be used) for the duration of the COVID-19 declaration under Section 564(b)(1) of the Act, 21 U.S.C. section 360bbb-3(b)(1), unless the authorization is terminated or revoked.  Performed at [facility], [place] [address] [place], [place] [address]   CBG monitoring, ED     Status: Abnormal   Collection Time: 02/14/22  4:00 PM  Result Value Ref Range   Glucose-Capillary 125 (H) 70 - 99 mg/dL    Comment: Glucose reference range applies only to samples taken after fasting for at least 8 hours.  CBC with Differential     Status: Abnormal   Collection Time: 02/15/22  2:27 AM  Result Value Ref Range   WBC 6.2 4.0 - 10.5 K/uL   RBC 4.41 4.22 - 5.81 MIL/uL   Hemoglobin 12.3 (L) 13.0 - 17.0 g/dL   HCT 37.3 (L) 39.0 - 52.0 %   MCV 84.6 80.0 - 100.0 fL   MCH 27.9 26.0 - 34.0 pg   MCHC 33.0 30.0 - 36.0 g/dL   RDW 19.4 (H) 11.5 - 15.5 %   Platelets 148 (L) 150 - 400 K/uL   nRBC 0.0 0.0 - 0.2 %   Neutrophils Relative % 72 %   Neutro Abs 4.5 1.7 - 7.7 K/uL   Lymphocytes Relative 9 %   Lymphs Abs 0.6 (L) 0.7 - 4.0 K/uL   Monocytes Relative 14 %   Monocytes Absolute 0.9 0.1 - 1.0 K/uL   Eosinophils Relative 3 %   Eosinophils Absolute 0.2 0.0 - 0.5 K/uL   Basophils Relative 1 %   Basophils Absolute 0.0 0.0 - 0.1 K/uL   Immature Granulocytes 1 %   Abs Immature Granulocytes 0.06 0.00 - 0.07 K/uL    Comment: Performed at [facility], [address] [place], [place] [address]  Comprehensive metabolic panel     Status: Abnormal   Collection Time: 02/15/22  2:27 AM  Result Value Ref Range   Sodium 142 135 - 145 mmol/L   Potassium 3.6 3.5 - 5.1 mmol/L   Chloride 105 98 - 111 mmol/L   CO2 20 (L) 22 - 32 mmol/L   Glucose, Bld 135 (H) 70 - 99 mg/dL    Comment: Glucose reference range applies only to samples taken after fasting for at least 8  hours.   BUN 16 8 - 23 mg/dL   Creatinine, Ser 1.39 (H) 0.61 - 1.24 mg/dL   Calcium 9.6 8.9 - 10.3 mg/dL   Total Protein 8.4 (H) 6.5 - 8.1 g/dL   Albumin 4.2 3.5 - 5.0 g/dL   AST 88 (H) 15 - 41 U/L   ALT 77 (H) 0 - 44 U/L   Alkaline Phosphatase 51 38 - 126 U/L   Total Bilirubin 1.3 (H) 0.3 - 1.2 mg/dL   GFR, Estimated 54 (L) >60 mL/min     Comment: (NOTE) Calculated using the CKD-EPI Creatinine Equation (2021)    Anion gap 17 (H) 5 - 15    Comment: Performed at [facility], [place] [address] [place], [place] [address]  Troponin I (High Sensitivity)     Status: Abnormal   Collection Time: 02/15/22  2:27 AM  Result Value Ref Range   Troponin I (High Sensitivity) 23 (H) <18 ng/L    Comment: (NOTE) Elevated high sensitivity troponin I (hsTnI) values and significant  changes across serial measurements may suggest ACS but many other  chronic and acute conditions are known to elevate hsTnI results.  Refer to the "Links" section for chest pain algorithms and additional  guidance. Performed at [facility], [place] [address] [place], [place] [address]   Brain natriuretic peptide     Status: Abnormal   Collection Time: 02/15/22  2:27 AM  Result Value Ref Range   B Natriuretic Peptide 145.7 (H) 0.0 - 100.0 pg/mL    Comment: Performed at [facility] [address], [place], [place] [address]  Troponin I (High Sensitivity)     Status: Abnormal   Collection Time: 02/15/22  5:55 AM  Result Value Ref Range   Troponin I (High Sensitivity) 34 (H) <18 ng/L    Comment: (NOTE) Elevated high sensitivity troponin I (hsTnI) values and significant  changes across serial measurements may suggest ACS but many other  chronic and acute conditions are known to elevate hsTnI results.  Refer to the "Links" section for chest pain algorithms and additional  guidance. Performed at [facility], [place] [address] [place], [place] [address]   Ethanol     Status: None   Collection Time: 02/15/22  5:55 AM  Result Value Ref Range   Alcohol, Ethyl (B) <10 <10 mg/dL    Comment: (NOTE) Lowest detectable limit for serum alcohol is 10 mg/dL.  For medical purposes only. Performed at [facility], [place] [address] [place], [place] [address]   CBG monitoring, ED     Status: None   Collection Time: 02/15/22  8:49 AM  Result  Value Ref Range   Glucose-Capillary 92 70 - 99 mg/dL    Comment: Glucose reference range applies only to samples taken after fasting for at least 8 hours.   Comment 1 Notify RN    Comment 2 Document in Chart   Ammonia     Status: None   Collection Time: 02/15/22  8:50 AM  Result Value Ref Range   Ammonia 24 9 - 35 umol/L    Comment: Performed at [facility], [place] [address], [place], [place] [address]  Protime-INR     Status: None   Collection Time: 02/15/22  8:50 AM  Result Value Ref Range   Prothrombin Time 15.0 11.4 - 15.2 seconds   INR 1.2 0.8 - 1.2    Comment: (NOTE) INR goal varies based on device and  disease states. Performed at [facility], [place] [address] [place], [place] [address]   Troponin I (High Sensitivity)     Status: Abnormal   Collection Time: 02/15/22  8:50 AM  Result Value Ref Range   Troponin I (High Sensitivity) 34 (H) <18 ng/L    Comment: (NOTE) Elevated high sensitivity troponin I (hsTnI) values and significant  changes across serial measurements may suggest ACS but many other  chronic and acute conditions are known to elevate hsTnI results.  Refer to the "Links" section for chest pain algorithms and additional  guidance. Performed at [facility], [place] [address], [place], [place] [address]   Lactic acid, plasma     Status: None   Collection Time: 02/15/22  8:50 AM  Result Value Ref Range   Lactic Acid, Venous 1.9 0.5 - 1.9 mmol/L    Comment: Performed at [facility] [address] [place], [place] [address]   CT Cervical Spine Wo Contrast  Result Date: 02/15/2022 CLINICAL DATA:  Neck trauma (Age >= 65y).  Fall. EXAM: CT CERVICAL SPINE WITHOUT CONTRAST TECHNIQUE: Multidetector CT imaging of the cervical spine was performed without intravenous contrast. Multiplanar CT image reconstructions were also generated. RADIATION DOSE REDUCTION: This exam was performed according to the departmental dose-optimization program which  includes automated exposure control, adjustment of the mA and/or kV according to patient size and/or use of iterative reconstruction technique. COMPARISON:  None Available. FINDINGS: Alignment: No subluxation. Skull base and vertebrae: No acute fracture. No primary bone lesion or focal pathologic process. Soft tissues and spinal canal: No prevertebral fluid or swelling. No visible canal hematoma. Disc levels: Diffuse advanced degenerative disc disease. Mild bilateral degenerative facet disease. Upper chest: No acute findings Other: None IMPRESSION: No acute bony abnormality. Electronically Signed   By: Rolm Baptise M.D.   On: 02/15/2022 02:54   CT Head Wo Contrast  Result Date: 02/15/2022 CLINICAL DATA:  Head trauma, moderate-severe.  Fall. EXAM: CT HEAD WITHOUT CONTRAST TECHNIQUE: Contiguous axial images were obtained from the base of the skull through the vertex without intravenous contrast. RADIATION DOSE REDUCTION: This exam was performed according to the departmental dose-optimization program which includes automated exposure control, adjustment of the mA and/or kV according to patient size and/or use of iterative reconstruction technique. COMPARISON:  09/23/2021 FINDINGS: Brain: There is atrophy and chronic small vessel disease changes. No acute intracranial abnormality. Specifically, no hemorrhage, hydrocephalus, mass lesion, acute infarction, or significant intracranial injury. Vascular: No hyperdense vessel or unexpected calcification. Skull: No acute calvarial abnormality. Sinuses/Orbits: No acute findings Other: Soft tissue swelling in the left forehead. IMPRESSION: Atrophy, chronic microvascular disease. No acute intracranial abnormality. Electronically Signed   By: Rolm Baptise M.D.   On: 02/15/2022 02:53    Pending Labs Unresulted Labs (From admission, onward)     Start     Ordered   02/16/22 0500  CBC  Tomorrow morning,   R        02/15/22 0730   02/16/22 1761  Basic metabolic panel   Tomorrow morning,   R        02/15/22 0730            Vitals/Pain Today's Vitals   02/15/22 0700 02/15/22 0812 02/15/22 1052 02/15/22 1645  BP: (!) 207/108 (!) 196/112  (!) 183/107  Pulse: (!) 110 (!) 106  (!) 118  Resp:  16  16  Temp:   98.7 F (37.1 C) 98.7 F (37.1 C)  TempSrc:  Oral   SpO2: 100% 99%  96%  PainSc:        Isolation Precautions No active isolations  Medications Medications  enoxaparin (LOVENOX) injection 40 mg (40 mg Subcutaneous Given 02/15/22 0825)  multivitamin with minerals tablet 1 tablet (1 tablet Oral Given 02/15/22 0823)  metoprolol tartrate (LOPRESSOR) tablet 25 mg (25 mg Oral Given 17/35/67 0141)  folic acid (FOLVITE) tablet 1 mg (1 mg Oral Given 02/15/22 0825)  thiamine (VITAMIN B1) tablet 100 mg (100 mg Oral Not Given 02/15/22 0839)  hydrochlorothiazide (HYDRODIURIL) tablet 12.5 mg (12.5 mg Oral Given 02/15/22 0822)    And  amLODipine (NORVASC) tablet 10 mg (10 mg Oral Given 02/15/22 0823)    And  irbesartan (AVAPRO) tablet 300 mg (300 mg Oral Given 02/15/22 0822)  feeding supplement (ENSURE ENLIVE / ENSURE PLUS) liquid 237 mL (237 mLs Oral Given 02/15/22 1632)  sodium chloride 0.9 % bolus 1,000 mL (0 mLs Intravenous Stopped 02/15/22 0654)  thiamine (VITAMIN B1) injection 100 mg (100 mg Intravenous Given 02/15/22 0545)  lactated ringers bolus 1,000 mL (1,000 mLs Intravenous New Bag/Given 02/15/22 1645)    Mobility walks with person assist Low fall risk   Focused Assessments Cardiac Assessment Handoff:    Lab Results  Component Value Date   CKTOTAL 515 (H) 10/18/2020   No results found for: "DDIMER" Does the Patient currently have chest pain? No    R Recommendations: See Admitting Provider Note  Report given to:   Additional Notes:

## 2022-02-15 NOTE — H&P (Signed)
Date: 02/15/2022               Patient Name:  Dan Henry MRN: [id number]  DOB: 1950/05/26 Age / Sex: 71 y.o., male   PCP: Sanjuan Dame, MD         Medical Service: Internal Medicine Teaching Service         Attending Physician: Dr. Aldine Contes, MD    First Contact: Dr. Carin Primrose Pager: [phone]  Second Contact: Dr. Alfonse Spruce Pager: [phone]       After Hours (After 5p/  First Contact Pager: [phone]  weekends / holidays): Second Contact Pager: [phone]   Chief Complaint: fall, hand shaking  History of Present Illness:  Dan Henry is a 71 year old person living with chronically uncontrolled HTN, benign essential tremor, severe alcohol use disorder, mixed anemia (IDA, bone marrow suppression from AUD), chronically elevated liver enzymes presenting with complaints of sustaining a fall and hand shaking.  Patient states that he initially presented yesterday afternoon with a headache and chest pain. After obtaining initial triage lab work, he elected to leave AMA due to wait times. As he was walking home, he tripped over the sidewalk curb and sustained a mechanical fall where he hit his head but did not have any LOC. He called EMS who arrived and noted that patient's BP was 270/120 with HR 120s. His BP spontaneously came down to 199/122 but he remained tachycardic. He was noted to have an abrasion to his left forehead. He was subsequently brought to the ED. Patient reports that his headache and chest pain resolved at time of arrival to ED. Denies fevers, chills, nausea, vomiting, blurry vision, palpitations, abdominal pain, diarrhea, constipation, urinary changes. He endorses intermittent weakness at home. As aforementioned, he currently denies any chest pain or headaches.  Patient reports that he has experienced hand shaking/tremors for a while now and this has been scary for him. He notes some hand shaking at rest, but it becomes more apparent when he tries to grab an object  (like utensils, etc.). He believes that the shaking has remained stable over this time but it does not seem to go away. The shaking is only in his hands and does not affect his other extremities. On chart review, patient does have a history of essential tremors for which he was prescribed metoprolol. He reports that he is not completely adherent with his home medications, stating that he probably takes them about 3 days a week. Discussed if medications were cost-prohibitive, but he denies this and states he just does not like to take them everyday.   Of note, patient has a long history of severe alcohol use disorder, but he reports that he is no longer drinking alcohol as of about a month ago. He decided to quit on his own and has not relapsed since that time. He states that his neighbor calls him everyday to make sure that he has not had anything to drink.  ED course: Labs essentially unchanged from yesterday. CBC with chronic normocytic anemia, mild thrombocytopenia. CMP revealed chronically elevated liver enzymes, a mild AKI, bicarb of 20, AG of 17, glucose 135. BNP equivocal at 145. He does have mildly uptrending troponins (23 > 34). His ethanol level is undetectable. IMTS was consulted to admit for severe asymptomatic HTN.   Meds:  Current Meds  Medication Sig   feeding supplement (ENSURE ENLIVE / ENSURE PLUS) LIQD Take 237 mLs by mouth 3 (three) times daily between meals.   folic acid (FOLVITE)  1 MG tablet Take 1 tablet (1 mg total) by mouth daily.   metoprolol tartrate (LOPRESSOR) 25 MG tablet Take 1 tablet (25 mg total) by mouth 2 (two) times daily.   Multiple Vitamin (MULTIVITAMIN WITH MINERALS) TABS tablet Take 1 tablet by mouth every morning.   nitroGLYCERIN (NITROSTAT) 0.4 MG SL tablet Place 1 tablet (0.4 mg total) under the tongue every 5 (five) minutes as needed for chest pain.   Olmesartan-amLODIPine-HCTZ 40-10-12.5 MG TABS Take 1 tablet by mouth daily.   simvastatin (ZOCOR) 10 MG  tablet Take 10 mg by mouth at bedtime.   thiamine 100 MG tablet Take 1 tablet (100 mg total) by mouth daily.     Allergies: Allergies as of 02/14/2022   (No Known Allergies)   Past Medical History:  Diagnosis Date   Acute kidney injury ([place]) 05/08/2017   Alcohol intoxication ([place]) 11/18/2019   Alcohol withdrawal ([place]) 04/23/2019   Alcohol withdrawal delirium ([place]) 02/14/2019   Hypertension    Hypokalemia 07/19/2017   Lactic acidosis 02/20/2018   Malnutrition of moderate degree 10/24/2018   Nosebleed 04/23/2019   Rhabdomyolysis 10/26/2018    Family History:  Family History  Problem Relation Age of Onset   Hypertension Mother    Hypertension Father    Hypertension Sister    Hypertension Sister    Stroke Sister    Cancer Brother        throat     Social History:  Social History   Socioeconomic History   Marital status: Single    Spouse name: Not on file   Number of children: 0   Years of education: 14   Highest education level: Not on file  Occupational History   Occupation: Retired  Tobacco Use   Smoking status: Never   Smokeless tobacco: Never  Vaping Use   Vaping Use: Never used  Substance and Sexual Activity   Alcohol use: Yes    Alcohol/week: 4.0 standard drinks of alcohol    Types: 4 Standard drinks or equivalent per week    Comment: occ   Drug use: No   Sexual activity: Not on file  Other Topics Concern   Not on file  Social History Narrative   Lives alone   Caffeine use: none   Retired: Worked in Administrator, arts part time, 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   Physical Exam: Blood pressure (!)  196/112, pulse (!) 106, temperature 99.3 F (37.4 C), temperature source Oral, resp. rate 16, SpO2 99 %. Physical Exam Constitutional:      Comments: Disheveled, chronically ill-appearing elderly male, sitting in bed, NAD.  HENT:     Head:     Comments: Shallow forehead abrasion above left eyebrow with mild swelling in that area.    Mouth/Throat:     Mouth: Mucous membranes are dry.     Pharynx: Oropharynx is clear. No oropharyngeal exudate.  Eyes:     General: No scleral icterus.    Extraocular Movements: Extraocular movements intact.     Conjunctiva/sclera: Conjunctivae normal.     Pupils: Pupils are equal, round, and reactive to light.  Cardiovascular:  Rate and Rhythm: Regular rhythm. Tachycardia present.     Pulses: Normal pulses.     Heart sounds: Normal heart sounds. No murmur heard.    No friction rub. No gallop.  Pulmonary:     Effort: Pulmonary effort is normal.     Breath sounds: Normal breath sounds. No wheezing, rhonchi or rales.  Abdominal:     General: Bowel sounds are normal. There is no distension.     Palpations: Abdomen is soft.     Tenderness: There is no abdominal tenderness. There is no guarding or rebound.  Musculoskeletal:        General: No swelling. Normal range of motion.  Skin:    General: Skin is warm and dry.  Neurological:     Mental Status: He is alert and oriented to person, place, and time. Mental status is at baseline.     Motor: No weakness.     Comments: Intention tremor noted bilaterally on finger-to-nose testing. No asterixis noted.  Psychiatric:        Mood and Affect: Mood normal.        Behavior: Behavior normal.     EKG: personally reviewed my interpretation is sinus tachycardia with moderate LVH  CT Cervical Spine Wo Contrast Result Date: 02/15/2022 IMPRESSION: No acute bony abnormality. Electronically Signed   By: Rolm Baptise M.D.   On: 02/15/2022 02:54   CT Head Wo Contrast Result Date: 02/15/2022 IMPRESSION: Atrophy,  chronic microvascular disease. No acute intracranial abnormality. Electronically Signed   By: Rolm Baptise M.D.   On: 02/15/2022 02:53     Assessment & Plan by Problem: Principal Problem:   Hypertensive emergency Active Problems:   Elevated LFTs   Benign essential tremor   Severe alcohol use disorder (HCC)   Anemia  Severe Symptomatic HTN Elevated Troponins Chronically uncontrolled HTN Patient with history of chronically uncontrolled HTN, primarily due to medication nonadherence. Per EMS report, initial BP 270/120s but this spontaneously decreased to 190s/120s and further decreased to 160s-180s/100s while in the ED. He initially had complaints of headache and chest pain but these have since resolved. On labs, he does have a mild uptrend in his troponins and a mild AKI as well. No obvious ischemic changes on EKG but does show moderate LVH likely from chronic HTN. Overall presentation is consistent with hypertensive emergency, although his BP does appear to be improving. It does not appear that he needs IV antihypertensive therapy at this time. Per EDP, he has received his home medications (hctz, norvasc, irbesartan, and metoprolol) with hopes to obtain better BP control. Will need to closely monitor BP to ensure that it does not drop precipitously to avoid an ischemic insult. Will also trend troponins until they peak. -closely monitor BP today -q4 neuro checks, if any acute neurological changes, obtain STAT imaging -goal BP reduction is about 25% in the first hour, then additional 10% over the next 23 hours, then gradually normalize over the next couple of days -HCTZ 12.'5mg'$  daily, norvasc '10mg'$  daily, irbesartan '300mg'$  daily, lopressor '25mg'$  BID -trend troponins  Sinus Tachycardia Has had persistent tachycardia up to 120s since arrival to ED. EKG showing sinus tachycardia. Although patient is hypertensive, he does appear dry on my exam which may suggest dehydration as etiology of tachycardia. He  has received a 1L NS bolus in the ED and will encourage PO intake while here. -encourage PO fluids  Mild AKI Baseline Cr ~0.9-1, on admission Cr 1.39. This is likely multifactorial in the setting of  likely severe symptomatic HTN and dehydration. He has received 1L NS bolus in the ED. BP is gradually improving as well so expect kidney function to reflect this. He is not complete adherent with HCTZ and irbesartan at home, but he received both of these in the ED. -encourage PO fluids -BP control as above -trend kidney function -avoid nephrotoxic medications  Essential Tremor Severe Alcohol Use Disorder Patient noted to have chronic essential tremors for which he was prescribed beta-blocker therapy. He is not fully adherent with this medication. Of note, patient has had tremors as part of presentation for alcohol withdrawal in the past. He is adamant that he has not consumed alcohol in the past month, but will order CIWA without ativan to ensure he is not in active withdrawal at this time. -continue metoprolol BID -CIWA without ativan  Chronically elevated liver enzymes Patient has had persistently elevated liver enzymes dating back to 2020, thought to be 2/2 chronic severe alcohol use disorder. He does have hepatic steatosis on prior imaging as well. Hopeful that patient has began to abstain from alcohol use as he reports to minimize risk of progression.   Dispo: Admit patient to Observation with expected length of stay less than 2 midnights.  Signed: Virl Axe, MD 02/15/2022, 8:49 AM  Pager: [phone] After 5pm on weekdays and 1pm on weekends: On Call pager: [phone]

## 2022-02-16 ENCOUNTER — Other Ambulatory Visit (HOSPITAL_COMMUNITY): Payer: Self-pay

## 2022-02-16 DIAGNOSIS — R251 Tremor, unspecified: Secondary | ICD-10-CM

## 2022-02-16 DIAGNOSIS — G3184 Mild cognitive impairment, so stated: Secondary | ICD-10-CM | POA: Diagnosis not present

## 2022-02-16 LAB — CBC
HCT: 34.6 % — ABNORMAL LOW (ref 39.0–52.0)
Hemoglobin: 11.5 g/dL — ABNORMAL LOW (ref 13.0–17.0)
MCH: 27.4 pg (ref 26.0–34.0)
MCHC: 33.2 g/dL (ref 30.0–36.0)
MCV: 82.6 fL (ref 80.0–100.0)
Platelets: 157 10*3/uL (ref 150–400)
RBC: 4.19 MIL/uL — ABNORMAL LOW (ref 4.22–5.81)
RDW: 18.5 % — ABNORMAL HIGH (ref 11.5–15.5)
WBC: 6.2 10*3/uL (ref 4.0–10.5)
nRBC: 0 % (ref 0.0–0.2)

## 2022-02-16 LAB — BASIC METABOLIC PANEL
Anion gap: 14 (ref 5–15)
BUN: 14 mg/dL (ref 8–23)
CO2: 27 mmol/L (ref 22–32)
Calcium: 9.8 mg/dL (ref 8.9–10.3)
Chloride: 99 mmol/L (ref 98–111)
Creatinine, Ser: 0.88 mg/dL (ref 0.61–1.24)
GFR, Estimated: >60 mL/min (ref >60)
Glucose, Bld: 99 mg/dL (ref 70–99)
Potassium: 2.9 mmol/L — ABNORMAL LOW (ref 3.5–5.1)
Sodium: 140 mmol/L (ref 135–145)

## 2022-02-16 LAB — MAGNESIUM: Magnesium: 1.6 mg/dL — ABNORMAL LOW (ref 1.7–2.4)

## 2022-02-16 LAB — PHOSPHORUS: Phosphorus: 3.4 mg/dL (ref 2.5–4.6)

## 2022-02-16 MED ORDER — IRBESARTAN 300 MG PO TABS
300.0000 mg | ORAL_TABLET | Freq: Every day | ORAL | 2 refills | Status: DC
  Filled 2022-02-16: qty 30, 30d supply, fill #0

## 2022-02-16 MED ORDER — METOPROLOL TARTRATE 25 MG PO TABS
25.0000 mg | ORAL_TABLET | Freq: Two times a day (BID) | ORAL | 2 refills | Status: DC
  Filled 2022-02-16: qty 60, 30d supply, fill #0

## 2022-02-16 MED ORDER — LORAZEPAM 1 MG PO TABS: 1.0000 mg | ORAL_TABLET | ORAL | Status: DC | PRN

## 2022-02-16 MED ORDER — LORAZEPAM 2 MG/ML IJ SOLN
1.0000 mg | INTRAMUSCULAR | Status: DC | PRN
  Administered 2022-02-16: 3 mg via INTRAVENOUS
  Filled 2022-02-16 (×2): qty 2

## 2022-02-16 MED ORDER — ORAL CARE MOUTH RINSE: 15.0000 mL | OROMUCOSAL | Status: DC | PRN

## 2022-02-16 MED ORDER — THIAMINE MONONITRATE 250 MG PO TABS
500.0000 mg | ORAL_TABLET | Freq: Three times a day (TID) | ORAL | 0 refills | Status: AC
  Filled 2022-02-16: qty 30, 4d supply, fill #0

## 2022-02-16 MED ORDER — MAGNESIUM SULFATE 2 GM/50ML IV SOLN
2.0000 g | Freq: Once | INTRAVENOUS | Status: AC
  Administered 2022-02-16: 2 g via INTRAVENOUS
  Filled 2022-02-16: qty 50

## 2022-02-16 MED ORDER — HYDROCHLOROTHIAZIDE 12.5 MG PO TABS
12.5000 mg | ORAL_TABLET | Freq: Every day | ORAL | 2 refills | Status: DC
  Filled 2022-02-16: qty 30, 30d supply, fill #0

## 2022-02-16 MED ORDER — THIAMINE MONONITRATE 100 MG PO TABS
500.0000 mg | ORAL_TABLET | Freq: Three times a day (TID) | ORAL | Status: DC
  Administered 2022-02-16 – 2022-02-18 (×6): 500 mg via ORAL
  Filled 2022-02-16 (×6): qty 5

## 2022-02-16 MED ORDER — THIAMINE HCL 100 MG PO TABS: 100.0000 mg | ORAL_TABLET | Freq: Every day | ORAL | 2 refills | Status: DC

## 2022-02-16 MED ORDER — POTASSIUM CHLORIDE 10 MEQ/100ML IV SOLN
10.0000 meq | INTRAVENOUS | Status: AC
  Administered 2022-02-16 (×4): 10 meq via INTRAVENOUS
  Filled 2022-02-16 (×4): qty 100

## 2022-02-16 MED ORDER — AMLODIPINE BESYLATE 10 MG PO TABS
10.0000 mg | ORAL_TABLET | Freq: Every day | ORAL | 2 refills | Status: DC
  Filled 2022-02-16: qty 30, 30d supply, fill #0

## 2022-02-16 MED ORDER — POTASSIUM CHLORIDE CRYS ER 20 MEQ PO TBCR
40.0000 meq | EXTENDED_RELEASE_TABLET | Freq: Two times a day (BID) | ORAL | Status: AC
  Administered 2022-02-16 (×2): 40 meq via ORAL
  Filled 2022-02-16 (×2): qty 2

## 2022-02-16 NOTE — Evaluation (Signed)
Occupational Therapy Evaluation Patient Details Name: Dan Henry MRN: [id number] DOB: 08-01-1950 Today's Date: 02/16/2022   History of Present Illness Pt is a 71 y/o M presenting to ED on 10/16 with mechanical fall without LOC after tripping on a curb, found to be hypertensive and tachycardic. PMH includes ETOH abuse, uncontrolled HTN, essential tremor, mixed anemia, and hypokalemia.   Clinical Impression   Pt reports independence at baseline with ADLs and functional mobility, lives alone and reports he has PRN assist from friends/family. Pt needing min-mod A for ADLs, min guard for bed mobility and transfers without AD. Pt with mild unsteadiness due to baseline essential tremors. Pt with flat affect, following commands appropriately and A & Ox4. Pt presenting with impairments listed below, will follow acutely. Recommend HHOT at d/c.      Recommendations for follow up therapy are one component of a multi-disciplinary discharge planning process, led by the attending physician.  Recommendations may be updated based on patient status, additional functional criteria and insurance authorization.   Follow Up Recommendations  Home health OT    Assistance Recommended at Discharge Set up Supervision/Assistance  Patient can return home with the following A little help with walking and/or transfers;A little help with bathing/dressing/bathroom;Assistance with cooking/housework;Assist for transportation;Help with stairs or ramp for entrance;Direct supervision/assist for medications management    Functional Status Assessment  Patient has had a recent decline in their functional status and demonstrates the ability to make significant improvements in function in a reasonable and predictable amount of time.  Equipment Recommendations  BSC/3in1    Recommendations for Other Services PT consult     Precautions / Restrictions Precautions Precautions: Fall Restrictions Weight Bearing Restrictions: No       Mobility Bed Mobility Overal bed mobility: Needs Assistance Bed Mobility: Supine to Sit     Supine to sit: Min guard          Transfers Overall transfer level: Needs assistance Equipment used: None Transfers: Sit to/from Stand Sit to Stand: Min guard                  Balance Overall balance assessment: Needs assistance Sitting-balance support: Feet supported Sitting balance-Leahy Scale: Good Sitting balance - Comments: can reach down to pull up sock with out LOB   Standing balance support: During functional activity, Reliant on assistive device for balance Standing balance-Leahy Scale: Fair                             ADL either performed or assessed with clinical judgement   ADL Overall ADL's : Needs assistance/impaired Eating/Feeding: Modified independent   Grooming: Min guard   Upper Body Bathing: Minimal assistance   Lower Body Bathing: Moderate assistance   Upper Body Dressing : Minimal assistance   Lower Body Dressing: Moderate assistance   Toilet Transfer: Min guard   Toileting- Water quality scientist and Hygiene: Min guard;Sit to/from stand       Functional mobility during ADLs: Min guard;Rolling walker (2 wheels)       Vision Patient Visual Report: No change from baseline Additional Comments: able to read signs ~15 ft away, reports wearing glasses "sometimes"     Perception     Praxis      Pertinent Vitals/Pain Pain Assessment Pain Assessment: No/denies pain     Hand Dominance     Extremity/Trunk Assessment Upper Extremity Assessment Upper Extremity Assessment: Generalized weakness (generalized tremors, pt reports x2 months, able to self feed  and perform functional ADL tasks)   Lower Extremity Assessment Lower Extremity Assessment: Defer to PT evaluation   Cervical / Trunk Assessment Cervical / Trunk Assessment: Normal   Communication Communication Communication: No difficulties   Cognition  Arousal/Alertness: Awake/alert Behavior During Therapy: WFL for tasks assessed/performed, Flat affect Overall Cognitive Status: No family/caregiver present to determine baseline cognitive functioning Area of Impairment: Memory, Following commands, Safety/judgement, Awareness, Problem solving                       Following Commands: Follows one step commands with increased time   Awareness: Emergent Problem Solving: Slow processing, Decreased initiation General Comments: A & Ox4, able to recall incident leading to hospital admission, following commands appropriately and providing home set up info     General Comments  VSS On RA    Exercises     Shoulder Instructions      Home Living Family/patient expects to be discharged to:: Private residence Living Arrangements: Alone Available Help at Discharge: Family;Available PRN/intermittently Type of Home: House Home Access: Stairs to enter [employer] of Steps: 7   Home Layout: One level     Bathroom Shower/Tub: Corporate investment banker: Standard     Home Equipment: None          Prior Functioning/Environment Prior Level of Function : Independent/Modified Independent             Mobility Comments: no AD use ADLs Comments: uses taxi service for transportation        OT Problem List: Decreased strength;Decreased range of motion;Decreased activity tolerance;Impaired balance (sitting and/or standing);Decreased cognition;Decreased safety awareness      OT Treatment/Interventions: Self-care/ADL training;Therapeutic exercise;DME and/or AE instruction;Energy conservation;Therapeutic activities;Patient/family education;Balance training    OT Goals(Current goals can be found in the care plan section) Acute Rehab OT Goals Patient Stated Goal: none stated OT Goal Formulation: With patient Time For Goal Achievement: 03/02/22 Potential to Achieve Goals: Good ADL Goals Pt Will Perform  Upper Body Dressing: with modified independence;sitting Pt Will Perform Lower Body Dressing: with modified independence;sit to/from stand Pt Will Transfer to Toilet: with modified independence;ambulating;regular height toilet Pt Will Perform Tub/Shower Transfer: Tub transfer;Shower transfer;ambulating;3 in 1  OT Frequency: Min 2X/week    Co-evaluation              AM-PAC OT "6 Clicks" Daily Activity     Outcome Measure Help from another person eating meals?: None Help from another person taking care of personal grooming?: A Little Help from another person toileting, which includes using toliet, bedpan, or urinal?: A Little Help from another person bathing (including washing, rinsing, drying)?: A Lot Help from another person to put on and taking off regular upper body clothing?: A Little Help from another person to put on and taking off regular lower body clothing?: A Lot 6 Click Score: 17   End of Session Equipment Utilized During Treatment: Gait belt Nurse Communication: Mobility status  Activity Tolerance: Patient tolerated treatment well Patient left: with call bell/phone within reach;in chair;with chair alarm set  OT Visit Diagnosis: Unsteadiness on feet (R26.81);Other abnormalities of gait and mobility (R26.89);Muscle weakness (generalized) (M62.81)                Time: 6222-9798 OT Time Calculation (min): 22 min Charges:  OT General Charges $OT Visit: 1 Visit OT Evaluation $OT Eval Low Complexity: [place], OTD, OTR/L Acute Rehab [phone] - [place]  Torin Modica 02/16/2022, 12:15 PM

## 2022-02-16 NOTE — Discharge Summary (Addendum)
Name: Mikhai Bienvenue MRN: [id number] DOB: 17-Sep-1950 71 y.o. PCP: Sanjuan Dame, MD  Date of Admission: 02/14/2022  3:38 PM Date of Discharge: 02/18/2022 11:34 AM Attending Physician: Aldine Contes, MD  Discharge Diagnosis: Principal Problem:   Severe alcohol use disorder ([place]) Active Problems:   Elevated LFTs   Benign essential tremor   Anemia   Hypertensive emergency   Hypertensive urgency   Fever   Discharge Medications: Allergies as of 02/18/2022   No Known Allergies      Medication List     STOP taking these medications    Olmesartan-amLODIPine-HCTZ 40-10-12.5 MG Tabs       TAKE these medications    amLODipine 10 MG tablet Commonly known as: NORVASC Take 1 tablet (10 mg total) by mouth daily.   CertaVite/Antioxidants Tabs Take 1 tablet by mouth every morning.   feeding supplement Liqd Take 237 mLs by mouth 3 (three) times daily between meals.   folic acid 1 MG tablet Commonly known as: FOLVITE Take 1 tablet (1 mg total) by mouth daily.   hydrochlorothiazide 12.5 MG tablet Commonly known as: HYDRODIURIL Take 1 tablet (12.5 mg total) by mouth daily.   irbesartan 300 MG tablet Commonly known as: AVAPRO Take 1 tablet (300 mg total) by mouth daily.   metoprolol tartrate 25 MG tablet Commonly known as: LOPRESSOR Take 1 tablet (25 mg total) by mouth 2 (two) times daily.   naltrexone 50 MG tablet Commonly known as: DEPADE Take 1 tablet (50 mg total) by mouth daily.   nitroGLYCERIN 0.4 MG SL tablet Commonly known as: NITROSTAT Place 1 tablet (0.4 mg total) under the tongue every 5 (five) minutes as needed for chest pain.   QUEtiapine 25 MG tablet Commonly known as: SEROquel Take 1 tablet (25 mg total) by mouth at bedtime.   simvastatin 10 MG tablet Commonly known as: ZOCOR Take 1 tablet (10 mg total) by mouth at bedtime.   thiamine 100 MG tablet Commonly known as: VITAMIN B1 Take 1 tablet (100 mg total) by mouth daily. Start  taking 1 tablet daily on 02/21/22. Start taking on: February 22, 2022 What changed:  additional instructions These instructions start on February 22, 2022. If you are unsure what to do until then, ask your doctor or other care provider.   Thiamine Mononitrate 250 MG Tabs Take 500 mg by mouth 3 (three) times daily for 4 days. Take 2 tablets (500 mg) 3 times daily for 4 days.       Disposition and follow-up:   Mr. Rex Oesterle is a 71 y.o. year old male admitted to [facility] for hypertensive emergency and concern for alcohol withdrawal, Korsakoff syndrome, or cognitive impairment due to another cause. They were discharged from [facility] on hospital day 3 in Good condition.  At the hospital follow up visit please address:  Hypertension Initially presented with chest pain and headache. Cardiac and neurologic workup were unremarkable. Blood pressure was brought under control on a regimen of irbesartan, amlodipine, HCTZ, and metoprolol. He was discharged with those medicines in hand, via [facility]. - Amlodipine 10 mg - HCTZ 12.5 mg - Irbesartan 300 mg - Metoprolol 25 mg  Cognitive impairment Alcohol use disorder Initially there was some concern for withdrawal during this patient's hospitalization.  However, his condition rapidly resolved without requiring very much benzodiazepine therapy. On day of discharge he was alert to person, place, and current president.  High-dose thiamine was initiated. - Thiamine 500 mg  3 times daily until 10/22, then 100 mg daily thereafter - Consider cognitive testing MoCA or MMSE  Labs / imaging needed at time of follow-up: - BMP and magnesium  Follow-up Appointments:  Follow-up Information     Care, [facility] Follow up.   Specialty: Home Health Services Why: someone from [place] contact you to arrange start date and time for your therapy. Contact information: [address] [place] [address] [phone]                Hospital Course by problem list:  Wilburn Keir is a 71 y.o. with a PMH of hypertension, alcohol use disorder, who presented with complaints of fall and hand tremor, initially treated for severe symptomatic hypertension and alcohol withdrawal.  Now stable but with cognitive impairment due to extensive alcohol use history.  Severe alcohol use disorder Concern for alcohol withdrawal Cognitive impairment Long history of alcohol use disorder complicated by severe withdrawal in the past.  Patient was hypertensive and tachycardic on initial presentation.  CIWA score during first night of hospitalization was 16 and the patient was reported to have hallucinations.  He was treated with 1 dose of Ativan to good effect.  The following morning he said that his hallucinations were in fact nightmares.  He was alert and oriented.  Notably, the patient reports that he has not had a drink of alcohol in several weeks.  His ethanol level was less than 10.  Initial presentation with autonomic instability and hallucination was concerning for alcohol withdrawal.  However, severe symptomatic hypertension with tachycardia could have been misinterpreted as autonomic instability.  I also suspect this patient has some element of cognitive impairment likely secondary to prolonged severe alcohol use disorder.  He also had hypokalemia and hypomagnesemia, suggestive of malnutrition in the setting of prolonged severe alcohol use disorder and cognitive impairment.  Severe symptomatic hypertension Initially presented with confusion, chest pain, and headache.  Systolic blood pressure noted to be around 270 by EMS.  Cardiac and neurologic work-up were unremarkable.  Patient's blood pressure was treated to systolics averaging around 140 by day of discharge.  Day of discharge the patient's pain and headache had resolved.  He was discharged on a regimen of amlodipine,  irbesartan, and HCTZ.  Fever On night 2 of hospitalization, this patient had a fever to 101.6 with chills and rigors. By the following morning, his fever and symptoms had resolved. CBC, CXR and UA were all unremarkable. Blood cultures were not obtained. Patient was observed for 36 hours afterwards without recurrence of fever. Possibly due to autonomic instability in setting of mild alcohol withdrawal, but uncertain of etiology. Don't suspect infectious source.  Fall Patient without fractures or intracranial bleeding as a result of his fall due to stumbling at ground level.  Discharge Exam:  Subjective: Feels much better today.  Chest pain and headache resolved.  No subjective fevers, chills, rigors overnight.   Blood pressure 138/87, pulse 77, temperature 98.7 F (37.1 C), temperature source Oral, resp. rate 17, height '5\' 5"'$  (1.651 m), weight 56 kg, SpO2 97 %. General: Frail-appearing male.  In no apparent discomfort. Cardiovascular: Regular rate and rhythm. Respiratory: Lungs clear to auscultation bilaterally.  Normal work of breathing. Abdominal: Soft.  Nontender.  Nondistended. Skin: Warm and dry. Neuro: Alert.  No gross focal deficits. Psych: Pleasant.  Appropriate mood and affect.  Pertinent studies and procedures:   Latest Reference Range & Units 02/18/22 04:18  Sodium 135 -  145 mmol/L 139  Potassium 3.5 - 5.1 mmol/L 3.8  Chloride 98 - 111 mmol/L 102  CO2 22 - 32 mmol/L 26  Glucose 70 - 99 mg/dL 99  BUN 8 - 23 mg/dL 19  Creatinine 0.61 - 1.24 mg/dL 1.02  Calcium 8.9 - 10.3 mg/dL 10.0  Anion gap 5 - 15  11  Magnesium 1.7 - 2.4 mg/dL 1.7    Latest Reference Range & Units 02/18/22 04:18  WBC 4.0 - 10.5 K/uL 5.8  RBC 4.22 - 5.81 MIL/uL 4.23  Hemoglobin 13.0 - 17.0 g/dL 11.8 (L)  HCT 39.0 - 52.0 % 35.0 (L)  MCV 80.0 - 100.0 fL 82.7  MCH 26.0 - 34.0 pg 27.9  MCHC 30.0 - 36.0 g/dL 33.7  RDW 11.5 - 15.5 % 18.8 (H)  Platelets 150 - 400 K/uL 185  nRBC 0.0 - 0.2 % 0.0   Neutrophils % 62  Lymphocytes % 19  Monocytes Relative % 16  Eosinophil % 2  Basophil % 1  Immature Granulocytes % 0  NEUT# 1.7 - 7.7 K/uL 3.5  Lymphocyte # 0.7 - 4.0 K/uL 1.1  Monocyte # 0.1 - 1.0 K/uL 0.9  Eosinophils Absolute 0.0 - 0.5 K/uL 0.1  Basophils Absolute 0.0 - 0.1 K/uL 0.1  Abs Immature Granulocytes 0.00 - 0.07 K/uL 0.02  (L): Data is abnormally low (H): Data is abnormally high   Latest Reference Range & Units 02/17/22 15:16  Appearance CLEAR  CLEAR  Bilirubin Urine NEGATIVE  NEGATIVE  Color, Urine YELLOW  YELLOW  Glucose, UA NEGATIVE mg/dL NEGATIVE  Hgb urine dipstick NEGATIVE  NEGATIVE  Ketones, ur NEGATIVE mg/dL NEGATIVE  Leukocytes,Ua NEGATIVE  NEGATIVE  Nitrite NEGATIVE  NEGATIVE  pH 5.0 - 8.0  6.0  Protein NEGATIVE mg/dL NEGATIVE  Specific Gravity, Urine 1.005 - 1.030  1.018   CXR: No active disease  CT head and cervical spine: No acute intracranial abnormality. No subluxation or fracture.   Discharge Instructions      Mr. Jarron Curley  You were admitted for high blood pressure and concern for alcohol withdrawal. We are discharging you home now that you are doing better. To help assist you on your road to recovery, I have written the following recommendations:   Your blood pressure was treated with the following medicines: - Amlodipine - HCTZ - Irbesartan - Metoprolol  I am sending you home with these medications. Please take them as prescribed.  You were somewhat confused overnight during your hospital stay. Initially I was concerned for alcohol withdrawal, but since you haven't been drinking recently this is less likely. It's possible that alcohol use over a long period of time has led to slowed thought processes. For this I recommend taking thiamine, 2 tablets (500 mg) 3 times daily. Continue this for 4 days after you leave the hospital. Then, take a lower dose (100 mg) once daily.  It was a privilege to be a part of your hospital care  team, and I hope you feel better as a result of your stay.  All the best, Nani Gasser, MD       Signed: Nani Gasser MD 02/18/2022, 11:34 AM   Pager: [phone]

## 2022-02-16 NOTE — Progress Notes (Signed)
Subjective:   Hospital day 1.  Interval events: Hallucinating last night. CIWA score of 16. Ativan was administered due to concern for alcohol withdrawal.  Trying to get his medications from [facility]. He is oriented to place, city, year, president. He is not oriented to year. He denies auditory or visual hallucinations. Reports he had a nightmare yesterday. He denies chest pain. Reports his last drink was 2 months ago. Discussed concern about alcohol withdrawal. Reports his last alcohol withdrawal was a month ago. He is unable to recall history of seizures or ICU admission for alcohol withdrawal. Discussed plan to treat HTN.   Objective:  Vital signs: Blood pressure (!) 163/92, pulse 98, temperature 99.1 F (37.3 C), temperature source Oral, resp. rate 19, height '5\' 5"'$  (1.651 m), weight 56 kg, SpO2 100 %.  Weight change:   Physical exam: Constitutional: Frail-appearing male in no apparent distress. Cardiovascular: Regular rate and rhythm.  Left radial pulse 2+. Pulmonary: Normal work of breathing.  Clear to auscultation bilaterally. Skin: Warm and dry. Neuro: Bilateral upper extremity intention tremors.  No nystagmus.  No gross focal deficits. Psych: Pleasant.  Appropriate mood and affect.   Intake/Output Summary (Last 24 hours) at 02/16/2022 0630 Last data filed at 02/16/2022 0200 Gross per 24 hour  Intake 1240 ml  Output 350 ml  Net 890 ml    Pertinent Labs:    Latest Ref Rng & Units 02/16/2022    5:44 AM 02/15/2022    2:27 AM 02/14/2022    3:43 PM  CBC  WBC 4.0 - 10.5 K/uL 6.2  6.2  6.6   Hemoglobin 13.0 - 17.0 g/dL 11.5  12.3  12.2   Hematocrit 39.0 - 52.0 % 34.6  37.3  36.9   Platelets 150 - 400 K/uL 157  148  158        Latest Ref Rng & Units 02/15/2022    2:27 AM 02/14/2022    3:43 PM 10/15/2021    6:18 PM  CMP  Glucose 70 - 99 mg/dL 135  157  101   BUN 8 - 23 mg/dL '16  9  10   '$ Creatinine 0.61 - 1.24 mg/dL 1.39  1.14  0.85   Sodium 135 -  145 mmol/L 142  141  143   Potassium 3.5 - 5.1 mmol/L 3.6  3.7  3.4   Chloride 98 - 111 mmol/L 105  105  109   CO2 22 - 32 mmol/L '20  24  21   '$ Calcium 8.9 - 10.3 mg/dL 9.6  9.3  9.3   Total Protein 6.5 - 8.1 g/dL 8.4  8.5  8.3   Total Bilirubin 0.3 - 1.2 mg/dL 1.3  1.2  1.0   Alkaline Phos 38 - 126 U/L 51  54  57   AST 15 - 41 U/L 88  96  53   ALT 0 - 44 U/L 77  81  26    Assessment/Plan:   Principal Problem:   Hypertensive emergency Active Problems:   Elevated LFTs   Benign essential tremor   Severe alcohol use disorder (H[facility])   Anemia   Hypertensive urgency   Patient Summary: Dan Henry is a 71 y.o. with a PMH of hypertension, alcohol use disorder, who presented with complaints of fall and hand tremor, found to be severely hypertensive and tachycardic with headache and chest pain and was admitted for severe symptomatic hypertension versus  alcohol withdrawal.   Tremor Cognitive impairment History of alcohol use disorder Patient somewhat disoriented on exam today. Easily reoriented to person, place, and event. No nystagmus on exam. Claims hallucinations last night were in fact nightmares. With recent history of fall, tremors, and cognitive impairment, I have high index of suspicion for cognitive impairment secondary to chronic heavy alcohol use. Patient has not used in several weeks, so less likely to be acute withdrawal. Wernicke's less likely because the patient is not acutely delirious. Will treat with high dose thiamine and recommend cognitive testing at next outpatient visit. - Discontinue Ativan - Continue CIWA - Start thiamine 500 mg p.o. 3 times daily - Folic acid 1 g p.o. daily  Severe symptomatic hypertension, resolved Blood pressures better overnight and into today, around 150s over 90s - Amlodipine 10 mg p.o. daily - HCTZ 12.5 mg p.o. daily - Irbesartan 300 mg p.o. daily - Metoprolol tartrate 25 mg p.o. twice daily  Hypokalemia Hypomagnesemia Status post  replacement with 2 g IV mag and 120 mEq of potassium split between IV and p.o. Phosphorus level normal. - Morning BMP and magnesium  Transitions of care Barrier to discharge includes no safe shelter.  Minimal to no family support outside of the hospital.  Recommended the patient have home PT/OT.  Social work and case management working on safe discharge plan for patient.  Appreciate their assistance very much.  AKI, resolved - Morning BMP  Diet: Normal IVF: None VTE: Enoxaparin Code: Full PT/OT recs: Home Health, BSC/3in1. TOC recs: Pending safe discharge plan.  Dispo: Medically ready for discharge. Pending safe discharge plan.   Nani Gasser MD 02/16/2022, 6:30 AM  Pager: [phone] After 5pm on weekdays and 1pm on weekends: On Call pager: [phone]

## 2022-02-16 NOTE — Progress Notes (Signed)
Pt is very restless. Pt states he is hearing and seeing cars and people that are not there. Pt was yelling out "who is there?" And holding his belt to his pants like he was going to hit something. CIWA score is 16. RN made MD on call aware and Ativan was added to [facility] Ativan given at this time. RN will continue to monitor pt.   Foster Simpson Nekisha Mcdiarmid

## 2022-02-16 NOTE — Evaluation (Signed)
Physical Therapy Evaluation Patient Details Name: Dan Henry MRN: [id number] DOB: 04-11-1951 Today's Date: 02/16/2022  History of Present Illness  Pt is a 71 y/o M presenting to ED on 10/16 with mechanical fall without LOC after tripping on a curb, found to be hypertensive and tachycardic. PMH includes ETOH abuse, uncontrolled HTN, essential tremor, mixed anemia, and hypokalemia.  Clinical Impression  Pt admitted secondary to problem above with deficits below. Pt requiring min guard A for mobility tasks. Attempted gait with RW, however, pt had difficulty using correctly. Performed without AD and pt with mild tremors, but no LOB. Recommending HHPT at d/c to address mobility deficits. Will continue to follow acutely.        Recommendations for follow up therapy are one component of a multi-disciplinary discharge planning process, led by the attending physician.  Recommendations may be updated based on patient status, additional functional criteria and insurance authorization.  Follow Up Recommendations Home health PT      Assistance Recommended at Discharge Intermittent Supervision/Assistance  Patient can return home with the following  A little help with walking and/or transfers;A little help with bathing/dressing/bathroom;Assistance with cooking/housework;Help with stairs or ramp for entrance;Assist for transportation    Equipment Recommendations None recommended by PT  Recommendations for Other Services       Functional Status Assessment Patient has had a recent decline in their functional status and demonstrates the ability to make significant improvements in function in a reasonable and predictable amount of time.     Precautions / Restrictions Precautions Precautions: Fall Restrictions Weight Bearing Restrictions: No      Mobility  Bed Mobility Overal bed mobility: Needs Assistance Bed Mobility: Supine to Sit, Sit to Supine     Supine to sit: Min assist Sit to  supine: Supervision   General bed mobility comments: Min A to pull into sitting    Transfers Overall transfer level: Needs assistance Equipment used: Rolling walker (2 wheels) Transfers: Sit to/from Stand Sit to Stand: Min guard           General transfer comment: Min guard for safety    Ambulation/Gait Ambulation/Gait assistance: Min guard Gait Distance (Feet): 100 Feet Assistive device: Rolling walker (2 wheels), None Gait Pattern/deviations: Step-through pattern, Decreased stride length Gait velocity: Decreased     General Gait Details: Attempted to use RW, but pt tended to walk outside of it and veer to the L. No LOB noted without use of RW and min guard for safety.  Stairs            Wheelchair Mobility    Modified Rankin (Stroke Patients Only)       Balance Overall balance assessment: Needs assistance Sitting-balance support: Feet supported Sitting balance-Leahy Scale: Good Sitting balance - Comments: can reach down to pull up sock with out LOB   Standing balance support: During functional activity, Reliant on assistive device for balance Standing balance-Leahy Scale: Fair                               Pertinent Vitals/Pain Pain Assessment Pain Assessment: No/denies pain    Home Living Family/patient expects to be discharged to:: Private residence Living Arrangements: Alone Available Help at Discharge: Family;Available PRN/intermittently Type of Home: House Home Access: Level entry   Entrance Stairs-Number of Steps: 7   Home Layout: One level Home Equipment: None Additional Comments: Reports sister can stay with him as needed    Prior Function Prior  Level of Function : Independent/Modified Independent             Mobility Comments: no AD use ADLs Comments: uses taxi service for transportation     Hand Dominance        Extremity/Trunk Assessment   Upper Extremity Assessment Upper Extremity Assessment: Defer to OT  evaluation (tremors noted bilaterally)    Lower Extremity Assessment Lower Extremity Assessment: Generalized weakness    Cervical / Trunk Assessment Cervical / Trunk Assessment: Normal  Communication   Communication: No difficulties  Cognition Arousal/Alertness: Awake/alert Behavior During Therapy: WFL for tasks assessed/performed, Flat affect Overall Cognitive Status: No family/caregiver present to determine baseline cognitive functioning Area of Impairment: Memory, Following commands, Safety/judgement, Awareness, Problem solving                     Memory: Decreased short-term memory Following Commands: Follows one step commands with increased time Safety/Judgement: Decreased awareness of deficits Awareness: Emergent Problem Solving: Slow processing, Decreased initiation          General Comments General comments (skin integrity, edema, etc.): Educated about having sister stay with pt at d/c initially to ensure safety    Exercises     Assessment/Plan    PT Assessment Patient needs continued PT services  PT Problem List Decreased strength;Decreased activity tolerance;Decreased balance;Decreased mobility;Decreased knowledge of use of DME;Decreased knowledge of precautions;Decreased safety awareness;Decreased cognition       PT Treatment Interventions Gait training;DME instruction;Functional mobility training;Stair training;Therapeutic activities;Therapeutic exercise;Balance training;Patient/family education    PT Goals (Current goals can be found in the Care Plan section)  Acute Rehab PT Goals Patient Stated Goal: to go home PT Goal Formulation: With patient Time For Goal Achievement: 03/02/22 Potential to Achieve Goals: Good    Frequency Min 3X/week     Co-evaluation               AM-PAC PT "6 Clicks" Mobility  Outcome Measure Help needed turning from your back to your side while in a flat bed without using bedrails?: A Little Help needed moving  from lying on your back to sitting on the side of a flat bed without using bedrails?: A Little Help needed moving to and from a bed to a chair (including a wheelchair)?: A Little Help needed standing up from a chair using your arms (e.g., wheelchair or bedside chair)?: A Little Help needed to walk in hospital room?: A Little Help needed climbing 3-5 steps with a railing? : A Little 6 Click Score: 18    End of Session Equipment Utilized During Treatment: Gait belt Activity Tolerance: Patient tolerated treatment well Patient left: in bed;with call bell/phone within reach;with bed alarm set Nurse Communication: Mobility status PT Visit Diagnosis: Unsteadiness on feet (R26.81);Muscle weakness (generalized) (M62.81)    Time: 0165-5374 PT Time Calculation (min) (ACUTE ONLY): 14 min   Charges:   PT Evaluation $PT Eval Low Complexity: 1 Low          Reuel Derby, PT, DPT  Acute Rehabilitation Services  Office: [phone]   Rudean Hitt 02/16/2022, 2:27 PM

## 2022-02-16 NOTE — TOC Progression Note (Signed)
Transition of Care [facility]) - Progression Note    Patient Details  Name: Quinnton Bury MRN: [id number] Date of Birth: 07-02-1950  [facility] CM/SW Contact  Loletha Grayer Beverely Pace, RN Phone Number: 02/16/2022, 4:10 PM  Clinical Narrative:    Case Manager spoke with patient to arrange or Home Health services. His niece states that he is not residing at the address listed in demographics and she isnt sure where he lives. Patient says he is staying at [place] with a friend. CM asked if we could confirm if his friend would be willing for [facility] to come to the house. Mr. Lanphere said that he would but he doesn't have his phone with him to call and doesn't know the number. CM received permission to arrange for Home HealthPT/OT, referral called to [facility] with [facility]. Patient says he cant go home until after his friend gets from therapy tomorrow morning. TOC Team will follow and will arrange for Taxi transport home.      Expected Discharge Plan: [place] Services Barriers to Discharge: Other (must enter comment) (trying to determine where pt resides or is he homeless)  Expected Discharge Plan and Services Expected Discharge Plan: [place]   Discharge Planning Services: CM Consult Post Acute Care Choice: Home Health   Expected Discharge Date: 02/16/22                         HH Arranged: PT, OT, Social Work [employer] Agency: [place] Date [facility] Agency Contacted: 02/16/22 Time [place]: 1600 Representative spoke with at [place]: [place] ([place]) Interventions    Readmission Risk Interventions     No data to display

## 2022-02-16 NOTE — Discharge Instructions (Addendum)
Dan Henry  You were admitted for high blood pressure and concern for alcohol withdrawal. We are discharging you home now that you are doing better. To help assist you on your road to recovery, I have written the following recommendations:   Your blood pressure was treated with the following medicines: - Amlodipine - HCTZ - Irbesartan - Metoprolol  I am sending you home with these medications. Please take them as prescribed.  You were somewhat confused overnight during your hospital stay. Initially I was concerned for alcohol withdrawal, but since you haven't been drinking recently this is less likely. It's possible that alcohol use over a long period of time has led to slowed thought processes. For this I recommend taking thiamine, 2 tablets (500 mg) 3 times daily. Continue this for 4 days after you leave the hospital. Then, take a lower dose (100 mg) once daily.  It was a privilege to be a part of your hospital care team, and I hope you feel better as a result of your stay.  All the best, Nani Gasser, MD

## 2022-02-16 NOTE — Progress Notes (Signed)
Internal Medicine Attending:   I saw and examined the patient. I reviewed the resident's H&P note and I agree with the resident's findings and plan as documented in the resident's note.  In brief, patient is a 71 year old male with a past medical history of chronically uncontrolled hypertension, benign essential tremor, severe alcohol use disorder, anemia, chronically elevated liver enzymes who presented to the ED after a fall.  Patient was initially assessed in the ED on Monday afternoon for headache and chest pain.  After the initial triage patient left AMA secondary to long wait times.  He was walking home and fell and hit the sidewalk and sustained a mechanical fall and was brought to the ED for further evaluation.  Patient was noted to have elevated blood pressures with systolics in the 409W as well as tachycardia with heart rate in the 120s.  Patient's chest pain and headache resolved but patient was admitted to the hospital for hypertensive emergency.  In the ED, patient was noted to have a mild AKI with a creatinine of 1.39 as well as mildly elevated troponins of 34 in the setting of uncontrolled hypertension concerning for hypertensive emergency.  Patient states that he has not had any alcohol over the last month.  He did have an episode overnight where the nurse noted he was restless that he was hearing and seeing cars and people that are not there.  Today, patient states that it was a nightmare and he has not had any further episodes.  He does complain of persistent tremor but otherwise has no complaints.  Today's Vitals   02/15/22 2025 02/15/22 2100 02/16/22 0631 02/16/22 0818  BP: (!) 163/92  (!) 154/94   Pulse: 98  94   Resp: 19  18   Temp: 99.1 F (37.3 C)  98.1 F (36.7 C)   TempSrc: Oral  Oral   SpO2: 100%  97%   Weight:      Height:      PainSc:  0-No pain  0-No pain   Body mass index is 20.54 kg/m.  On exam, patient is an ill-appearing elderly male who is laying in bed in no  apparent distress.  Cardiovascular exam reveals mild tachycardia, normal heart sounds.  Lungs are clear to auscultation bilaterally.  Abdomen is soft, nontender, nondistended with normoactive bowel sounds.  Lower extremities with no tenderness to palpation and no pitting edema.  Patient had a normal mood and affect.  He was noted to be oriented x2 (not oriented to year).  He was also noted to have bilateral intention tremors in his hands.  No nystagmus noted.  Patient was admitted to the hospital with hypertensive emergency with systolic blood pressures in the high 200s as well as a mild AKI with a creatinine up to 1.3 and mildly elevated troponins up to 34 (likely demand in the setting of uncontrolled hypertension).  Patient's troponins have remained steady at 34 and his chest pain appears to have resolved.  EKG with sinus tachycardia and no significant ST-T wave changes.  Patient's AKI is also resolved.  His systolic blood pressures after resuming his home medications are running in the 150s to 160s.  No further work-up at this time.  I suspect patient should be stable for DC home later today.  Of note, patient did have an episode overnight where he saw cars and people in his room.  This was initially concerning for alcohol withdrawal.  He says that this was secondary to a nightmare and has  not noted any further hallucinations since then.  Patient states that he has not had an alcoholic drink in over a month and his EtOH level is normal here.  Patient CIWA scores have been 0 this morning.  He does have bilateral hand tremors but this appears to be chronic.  He has no nystagmus or encephalopathy at this time and no evidence of underlying Wernicke's.  We will continue with high-dose thiamine for now.  Will DC Ativan and continue with CIWA monitoring only.

## 2022-02-17 ENCOUNTER — Other Ambulatory Visit (HOSPITAL_COMMUNITY): Payer: Self-pay

## 2022-02-17 ENCOUNTER — Inpatient Hospital Stay (HOSPITAL_COMMUNITY): Payer: Medicare Other

## 2022-02-17 DIAGNOSIS — F1090 Alcohol use, unspecified, uncomplicated: Secondary | ICD-10-CM

## 2022-02-17 DIAGNOSIS — G3184 Mild cognitive impairment, so stated: Secondary | ICD-10-CM | POA: Diagnosis not present

## 2022-02-17 DIAGNOSIS — R509 Fever, unspecified: Secondary | ICD-10-CM | POA: Diagnosis not present

## 2022-02-17 DIAGNOSIS — R251 Tremor, unspecified: Secondary | ICD-10-CM | POA: Diagnosis not present

## 2022-02-17 LAB — CBC WITH DIFFERENTIAL/PLATELET
Abs Immature Granulocytes: 0.05 10*3/uL (ref 0.00–0.07)
Basophils Absolute: 0.1 10*3/uL (ref 0.0–0.1)
Basophils Relative: 1 %
Eosinophils Absolute: 0.1 10*3/uL (ref 0.0–0.5)
Eosinophils Relative: 1 %
HCT: 34.4 % — ABNORMAL LOW (ref 39.0–52.0)
Hemoglobin: 11.5 g/dL — ABNORMAL LOW (ref 13.0–17.0)
Immature Granulocytes: 1 %
Lymphocytes Relative: 11 %
Lymphs Abs: 0.9 10*3/uL (ref 0.7–4.0)
MCH: 27.6 pg (ref 26.0–34.0)
MCHC: 33.4 g/dL (ref 30.0–36.0)
MCV: 82.7 fL (ref 80.0–100.0)
Monocytes Absolute: 1.3 10*3/uL — ABNORMAL HIGH (ref 0.1–1.0)
Monocytes Relative: 15 %
Neutro Abs: 6.1 10*3/uL (ref 1.7–7.7)
Neutrophils Relative %: 71 %
Platelets: 165 10*3/uL (ref 150–400)
RBC: 4.16 MIL/uL — ABNORMAL LOW (ref 4.22–5.81)
RDW: 18.6 % — ABNORMAL HIGH (ref 11.5–15.5)
WBC: 8.5 10*3/uL (ref 4.0–10.5)
nRBC: 0 % (ref 0.0–0.2)

## 2022-02-17 LAB — BASIC METABOLIC PANEL
Anion gap: 9 (ref 5–15)
BUN: 16 mg/dL (ref 8–23)
CO2: 24 mmol/L (ref 22–32)
Calcium: 9.8 mg/dL (ref 8.9–10.3)
Chloride: 105 mmol/L (ref 98–111)
Creatinine, Ser: 1.05 mg/dL (ref 0.61–1.24)
GFR, Estimated: >60 mL/min (ref >60)
Glucose, Bld: 112 mg/dL — ABNORMAL HIGH (ref 70–99)
Potassium: 4.2 mmol/L (ref 3.5–5.1)
Sodium: 138 mmol/L (ref 135–145)

## 2022-02-17 LAB — URINALYSIS, ROUTINE W REFLEX MICROSCOPIC
Bilirubin Urine: NEGATIVE
Glucose, UA: NEGATIVE mg/dL
Hgb urine dipstick: NEGATIVE
Ketones, ur: NEGATIVE mg/dL
Leukocytes,Ua: NEGATIVE
Nitrite: NEGATIVE
Protein, ur: NEGATIVE mg/dL
Specific Gravity, Urine: 1.018 (ref 1.005–1.030)
pH: 6 (ref 5.0–8.0)

## 2022-02-17 LAB — MAGNESIUM: Magnesium: 1.8 mg/dL (ref 1.7–2.4)

## 2022-02-17 MED ORDER — QUETIAPINE FUMARATE 25 MG PO TABS
25.0000 mg | ORAL_TABLET | Freq: Every day | ORAL | 1 refills | Status: DC
  Filled 2022-02-17: qty 90, 90d supply, fill #0

## 2022-02-17 MED ORDER — SIMVASTATIN 10 MG PO TABS
10.0000 mg | ORAL_TABLET | Freq: Every day | ORAL | 1 refills | Status: DC
  Filled 2022-02-17: qty 90, 90d supply, fill #0

## 2022-02-17 MED ORDER — FOLIC ACID 1 MG PO TABS
1.0000 mg | ORAL_TABLET | Freq: Every day | ORAL | 0 refills | Status: DC
  Filled 2022-02-17: qty 100, 100d supply, fill #0

## 2022-02-17 MED ORDER — ADULT MULTIVITAMIN W/MINERALS CH
1.0000 | ORAL_TABLET | Freq: Every morning | ORAL | 0 refills | Status: DC
  Filled 2022-02-17: qty 30, 30d supply, fill #0

## 2022-02-17 MED ORDER — ACETAMINOPHEN 325 MG PO TABS
650.0000 mg | ORAL_TABLET | Freq: Four times a day (QID) | ORAL | Status: DC | PRN
  Administered 2022-02-17: 650 mg via ORAL
  Filled 2022-02-17: qty 2

## 2022-02-17 MED ORDER — THIAMINE HCL 100 MG PO TABS
100.0000 mg | ORAL_TABLET | Freq: Every day | ORAL | 0 refills | Status: AC
  Filled 2022-02-17: qty 100, 100d supply, fill #0

## 2022-02-17 MED ORDER — MAGNESIUM SULFATE 2 GM/50ML IV SOLN
2.0000 g | Freq: Once | INTRAVENOUS | Status: AC
  Administered 2022-02-17: 2 g via INTRAVENOUS
  Filled 2022-02-17: qty 50

## 2022-02-17 NOTE — TOC Transition Note (Signed)
Transition of Care [facility]) - CM/SW Discharge Note   Patient Details  Name: Dan Henry MRN: [id number] Date of Birth: 03-19-1951  Transition of [facility]) CM/SW Contact:  Tom-Johnson, Renea Ee, RN Phone Number: 02/17/2022, 2:48 PM   Clinical Narrative:     Patient is scheduled for discharge today. Home health info on AVS. Cab voucher will be given at time of discharge. Patient states he will be going to stay with his friend Kathee Delton. Denies any other needs. No further TOC needs noted.   Final next level of care: [place] Barriers to Discharge: Barriers Resolved   Patient Goals and CMS Choice Patient states their goals for this hospitalization and ongoing recovery are:: To return home CMS Medicare.gov Compare Post Acute Care list provided to:: Patient Choice offered to / list presented to : Patient  Discharge Placement                Patient to be transferred to facility by: [facility]      Discharge Plan and Services   Discharge Planning Services: CM Consult Post Acute Care Choice: Home Health          DME Arranged: N/A DME Agency: NA       HH Arranged: PT, OT, Social Work [employer] Agency: [place] Date [place]: 02/16/22 Time [place] Agency Contacted: 1600 Representative spoke with at [place]: [place] ([place]) Interventions     Readmission Risk Interventions     No data to display

## 2022-02-17 NOTE — TOC Transition Note (Signed)
TOC medications (5 total) filled and ready in [facility] pharmacy on 2nd floor awaiting pt discharge.

## 2022-02-17 NOTE — Progress Notes (Signed)
Mobility Specialist Progress Note:   02/17/22 1430  Mobility  Activity Ambulated with assistance in hallway  Level of Assistance Minimal assist, patient does 75% or more  Assistive Device Front wheel walker;None  Distance Ambulated (ft) 200 ft  Activity Response Tolerated well  Mobility Referral Yes  $Mobility charge 1 Mobility   Pt agreeable to mobility session. Required minA to stand with a heavy posterior lean upon standing. Benefited from RW at beginning of gait, then trialed no AD and pt was steady. Back in bed with all needs met, bed alarm on.   Nelta Numbers Acute Rehab Secure Chat or Office Phone: [phone]

## 2022-02-17 NOTE — Progress Notes (Addendum)
Subjective:   Hospital day 2.  Interval events: Fever to 101.3 overnight.  Feels okay this morning.  Reports that he had some rigors and chills overnight when he had a fever.  Denies cough, sore throat.  Reports little bit of increased urinary frequency no dysuria.  Denies abdominal pain.  Objective:  Vital signs: Blood pressure 136/75, pulse 84, temperature 98.1 F (36.7 C), temperature source Oral, resp. rate 17, height '5\' 5"'$  (1.651 m), weight 56 kg, SpO2 98 %.  Physical exam: Constitutional: Frail-appearing male.  Apparently comfortable. Cardiovascular: Regular rate and rhythm. Pulmonary: Normal work of breathing.  Clear to auscultation bilaterally. Abdominal: Soft.  Nontender.  Nondistended. Skin: Warm.  Somewhat diaphoretic. Neuro: No gross focal deficits.   Intake/Output Summary (Last 24 hours) at 02/17/2022 1541 Last data filed at 02/17/2022 1522 Gross per 24 hour  Intake 510 ml  Output 625 ml  Net -115 ml    Pertinent Labs:    Latest Ref Rng & Units 02/17/2022    6:28 AM 02/16/2022    5:44 AM 02/15/2022    2:27 AM  CBC  WBC 4.0 - 10.5 K/uL 8.5  6.2  6.2   Hemoglobin 13.0 - 17.0 g/dL 11.5  11.5  12.3   Hematocrit 39.0 - 52.0 % 34.4  34.6  37.3   Platelets 150 - 400 K/uL 165  157  148        Latest Ref Rng & Units 02/17/2022    2:20 AM 02/16/2022    5:44 AM 02/15/2022    2:27 AM  CMP  Glucose 70 - 99 mg/dL 112  99  135   BUN 8 - 23 mg/dL '16  14  16   '$ Creatinine 0.61 - 1.24 mg/dL 1.05  0.88  1.39   Sodium 135 - 145 mmol/L 138  140  142   Potassium 3.5 - 5.1 mmol/L 4.2  2.9  3.6   Chloride 98 - 111 mmol/L 105  99  105   CO2 22 - 32 mmol/L '24  27  20   '$ Calcium 8.9 - 10.3 mg/dL 9.8  9.8  9.6   Total Protein 6.5 - 8.1 g/dL   8.4   Total Bilirubin 0.3 - 1.2 mg/dL   1.3   Alkaline Phos 38 - 126 U/L   51   AST 15 - 41 U/L   88   ALT 0 - 44 U/L   77    Chest x-ray: No acute abnormality.  Assessment/Plan:   Principal Problem:    Severe alcohol use disorder (HCC) Active Problems:   Elevated LFTs   Benign essential tremor   Anemia   Hypertensive emergency   Hypertensive urgency   Fever   Patient Summary: Dan Henry is a 71 y.o. with a PMH of hypertension, alcohol use disorder, who presented with complaints of fall and hand tremor, initially treated for severe symptomatic hypertension and alcohol withdrawal.  Now stable but with cognitive impairment due to extensive alcohol use history.  Also with new fever overnight.  Fever Isolated temperature elevation in to 101.3 F overnight.  No localizing signs or symptoms of infection on history or exam.  Patient did complain of rigors and chills.  Will obtain CXR and UA to evaluate for potential infectious causes.  Differential also includes autonomic instability in setting of severe alcohol use disorder.  Hold Tylenol and obtain blood cultures if fever recurs. -  Follow-up UA and CXR - Blood cultures for repeat fever  Tremor Cognitive impairment History of alcohol use disorder With recent history of fall, tremors, and cognitive impairment, I have high index of suspicion for cognitive impairment secondary to chronic heavy alcohol use. Patient has not used in several weeks, so less likely to be acute withdrawal. Wernicke's less likely because the patient is not acutely delirious. Will treat with high dose thiamine and recommend cognitive testing at next outpatient visit. - Continue CIWA - Start thiamine 500 mg p.o. 3 times daily - Folic acid 1 mg p.o. daily  Severe symptomatic hypertension, resolved - Amlodipine 10 mg p.o. daily - HCTZ 12.5 mg p.o. daily - Irbesartan 300 mg p.o. daily - Metoprolol tartrate 25 mg p.o. twice daily   Hypokalemia, resolved Hypomagnesemia - Morning BMP and magnesium   Transitions of care PT/OT recommended the patient have home PT/OT.  Social work and case management working on safe discharge plan for patient.  Appreciate their  assistance very much.  We will also reorder home meds to be delivered to bedside by [facility] pharmacy.   AKI, resolved - Morning BMP  Diet: Normal IVF: None VTE: Enoxaparin Code: Full PT/OT recs: Home Health, BSC/3in1. TOC recs: Pending safe discharge plan.   Dispo: Pending safe discharge plan and fever workup.   Nani Gasser MD 02/17/2022, 3:41 PM  Pager: [phone] After 5pm on weekdays and 1pm on weekends: On Call pager: [phone]

## 2022-02-17 NOTE — TOC Transition Note (Signed)
Discharge medications (4) are being stored in the [place] until patient is ready for discharge.

## 2022-02-17 NOTE — Progress Notes (Signed)
Pt has a temp. Of 101.3, RN messaged MD on call for prn order for Tylenol.   Dan Henry Dan Henry

## 2022-02-18 LAB — CBC WITH DIFFERENTIAL/PLATELET
Abs Immature Granulocytes: 0.02 10*3/uL (ref 0.00–0.07)
Basophils Absolute: 0.1 10*3/uL (ref 0.0–0.1)
Basophils Relative: 1 %
Eosinophils Absolute: 0.1 10*3/uL (ref 0.0–0.5)
Eosinophils Relative: 2 %
HCT: 35 % — ABNORMAL LOW (ref 39.0–52.0)
Hemoglobin: 11.8 g/dL — ABNORMAL LOW (ref 13.0–17.0)
Immature Granulocytes: 0 %
Lymphocytes Relative: 19 %
Lymphs Abs: 1.1 10*3/uL (ref 0.7–4.0)
MCH: 27.9 pg (ref 26.0–34.0)
MCHC: 33.7 g/dL (ref 30.0–36.0)
MCV: 82.7 fL (ref 80.0–100.0)
Monocytes Absolute: 0.9 10*3/uL (ref 0.1–1.0)
Monocytes Relative: 16 %
Neutro Abs: 3.5 10*3/uL (ref 1.7–7.7)
Neutrophils Relative %: 62 %
Platelets: 185 10*3/uL (ref 150–400)
RBC: 4.23 MIL/uL (ref 4.22–5.81)
RDW: 18.8 % — ABNORMAL HIGH (ref 11.5–15.5)
WBC: 5.8 10*3/uL (ref 4.0–10.5)
nRBC: 0 % (ref 0.0–0.2)

## 2022-02-18 LAB — BASIC METABOLIC PANEL
Anion gap: 11 (ref 5–15)
BUN: 19 mg/dL (ref 8–23)
CO2: 26 mmol/L (ref 22–32)
Calcium: 10 mg/dL (ref 8.9–10.3)
Chloride: 102 mmol/L (ref 98–111)
Creatinine, Ser: 1.02 mg/dL (ref 0.61–1.24)
GFR, Estimated: >60 mL/min (ref >60)
Glucose, Bld: 99 mg/dL (ref 70–99)
Potassium: 3.8 mmol/L (ref 3.5–5.1)
Sodium: 139 mmol/L (ref 135–145)

## 2022-02-18 LAB — GLUCOSE, CAPILLARY: Glucose-Capillary: 110 mg/dL — ABNORMAL HIGH (ref 70–99)

## 2022-02-18 LAB — MAGNESIUM: Magnesium: 1.7 mg/dL (ref 1.7–2.4)

## 2022-02-18 NOTE — Care Management Important Message (Signed)
Important Message  Patient Details  Name: Dan Henry MRN: [id number] Date of Birth: July 22, 1950   Medicare Important Message Given:  Yes  Patient left prior to IM delivery will mail to the patient home address.    Mikeya Tomasetti 02/18/2022, 2:11 PM

## 2022-02-18 NOTE — TOC Transition Note (Signed)
TOC Meds delivered to room for patient discharge

## 2022-02-18 NOTE — TOC Transition Note (Signed)
Transition of Care [facility]) - CM/SW Discharge Note   Patient Details  Name: Dan Henry MRN: [id number] Date of Birth: Jun 30, 1950  Transition of Care [facility]) CM/SW Contact:  Tom-Johnson, Renea Ee, RN Phone Number: 02/18/2022, 12:40 PM   Clinical Narrative:     Patient not discharged yesterday d/t pending labs. Discharging today. Cab voucher given, prescriptions delivered at bedside from [place]. No further TOC needs noted.   Final next level of care: [place] Barriers to Discharge: Barriers Resolved   Patient Goals and CMS Choice Patient states their goals for this hospitalization and ongoing recovery are:: To return home CMS Medicare.gov Compare Post Acute Care list provided to:: Patient Choice offered to / list presented to : Patient  Discharge Placement                Patient to be transferred to facility by: [facility]      Discharge Plan and Services   Discharge Planning Services: CM Consult Post Acute Care Choice: Home Health          DME Arranged: N/A DME Agency: NA       HH Arranged: PT, OT, Social Work [employer] Agency: [place] Date [place]: 02/16/22 Time [place] Agency Contacted: 1600 Representative spoke with at [place]: [place] ([place]) Interventions     Readmission Risk Interventions     No data to display

## 2022-02-18 NOTE — Progress Notes (Signed)
Occupational Therapy Treatment Patient Details Name: Lonza Shimabukuro MRN: [id number] DOB: July 17, 1950 Today's Date: 02/18/2022   History of present illness Pt is a 71 y/o M presenting to ED on 10/16 with mechanical fall without LOC after tripping on a curb, found to be hypertensive and tachycardic. PMH includes ETOH abuse, uncontrolled HTN, essential tremor, mixed anemia, and hypokalemia.   OT comments  Patient is probably close to his baseline.  Needs generalized supervision and up to [facility] for mobility at a RW level, and Min A for lower body ADL.  HH OT has been recommended, but I'm not sure how big of a role they can play given his cognitive deficits and propensity to drink.  Patient would benefit from a group home setting.  OT can follow, but, again, he is close to baseline.     Recommendations for follow up therapy are one component of a multi-disciplinary discharge planning process, led by the attending physician.  Recommendations may be updated based on patient status, additional functional criteria and insurance authorization.    Follow Up Recommendations  Home health OT    Assistance Recommended at Discharge Set up Supervision/Assistance  Patient can return home with the following  A little help with walking and/or transfers;A little help with bathing/dressing/bathroom;Assistance with cooking/housework;Assist for transportation;Help with stairs or ramp for entrance;Direct supervision/assist for medications management   Equipment Recommendations  BSC/3in1    Recommendations for Other Services      Precautions / Restrictions Precautions Precautions: Fall Restrictions Weight Bearing Restrictions: No       Mobility Bed Mobility Overal bed mobility: Needs Assistance Bed Mobility: Supine to Sit, Sit to Supine     Supine to sit: Supervision Sit to supine: Supervision        Transfers Overall transfer level: Needs assistance Equipment used: Rolling walker (2  wheels) Transfers: Sit to/from Stand Sit to Stand: Min guard                 Balance Overall balance assessment: Needs assistance Sitting-balance support: Feet supported Sitting balance-Leahy Scale: Good     Standing balance support: Reliant on assistive device for balance Standing balance-Leahy Scale: Fair                             ADL either performed or assessed with clinical judgement   ADL                   Upper Body Dressing : Supervision/safety;Sitting   Lower Body Dressing: Sitting/lateral leans;Min guard   Toilet Transfer: Supervision/safety;Regular Toilet;Rolling walker (2 wheels)   Toileting- Clothing Manipulation and Hygiene: Supervision/safety;Sitting/lateral lean                                              Cognition     Overall Cognitive Status: History of cognitive impairments - at baseline                                                             Pertinent Vitals/ Pain       Pain Assessment Pain Assessment: No/denies pain  Frequency  Min 2X/week        Progress Toward Goals  OT Goals(current goals can now be found in the care plan section)  Progress towards OT goals: Progressing toward goals  Acute Rehab OT Goals OT Goal Formulation: With patient Time For Goal Achievement: 03/02/22 Potential to Achieve Goals: Good  Plan      Co-evaluation                 AM-PAC OT "6 Clicks" Daily Activity     Outcome Measure   Help from another person eating meals?: None Help from another person taking care of personal grooming?: None Help from another person toileting, which includes using toliet, bedpan, or urinal?: A Little Help from another person bathing (including washing, rinsing, drying)?: A Little Help from another person to put on and taking off regular upper body clothing?:  None Help from another person to put on and taking off regular lower body clothing?: A Little 6 Click Score: 21    End of Session Equipment Utilized During Treatment: Gait belt  OT Visit Diagnosis: Unsteadiness on feet (R26.81);Other abnormalities of gait and mobility (R26.89);Muscle weakness (generalized) (M62.81)   Activity Tolerance Patient tolerated treatment well   Patient Left with call bell/phone within reach;in chair;with chair alarm set   Nurse Communication Mobility status        Time: [id number] OT Time Calculation (min): 17 min  Charges: OT General Charges $OT Visit: 1 Visit OT Treatments $Self Care/Home Management : 8-22 mins  02/18/2022  RP, OTR/L  Acute Rehabilitation Services  Office:  [phone]   Metta Clines 02/18/2022, 10:25 AM

## 2022-02-21 LAB — VITAMIN B1: Vitamin B1 (Thiamine): 211.4 nmol/L — ABNORMAL HIGH (ref 66.5–200.0)

## 2022-02-28 ENCOUNTER — Encounter: Payer: Medicare Other | Admitting: Student

## 2022-02-28 NOTE — Progress Notes (Incomplete)
Subjective:   Patient ID: Dan Henry male   DOB: August 26, 1950 71 y.o.   MRN: [id number]  HPI: Dan Henry is a 71 y.o. man with past medical history of hypertension presenting for follow up of his recent hospitalization for hypertensive emergency and concern for alcohol withdrawal, Korsakoff syndrome, or cognitive impairment due to another cause.   Review of Systems:  {Review Of Systems:30496}  Past Medical History:  Diagnosis Date   Acute kidney injury ([place]) 05/08/2017   Alcohol intoxication ([place]) 11/18/2019   Alcohol withdrawal ([place]) 04/23/2019   Alcohol withdrawal delirium ([place]) 02/14/2019   Hypertension    Hypokalemia 07/19/2017   Lactic acidosis 02/20/2018   Malnutrition of moderate degree 10/24/2018   Nosebleed 04/23/2019   Rhabdomyolysis 10/26/2018     Patient Active Problem List   Diagnosis Date Noted   Fever 02/17/2022   Hypertensive emergency 02/15/2022   Hypertensive urgency 02/15/2022   Need for vaccination with 13-polyvalent pneumococcal conjugate vaccine 08/10/2020   Screening for colon cancer 08/10/2020   Cognitive dysfunction 06/10/2020   Iron deficiency anemia 11/22/2019   Stable angina 05/08/2019   Anemia 02/26/2019   Urinary retention 10/25/2018   Malnutrition of moderate degree 10/24/2018   Severe alcohol use disorder ([place]) 10/23/2018   Hepatic hemangioma 10/19/2018   Visual hallucinations 06/26/2018   Abnormal TSH 06/26/2018   Need for hepatitis B vaccination 02/21/2018   Unintentional weight loss 02/16/2018   Benign essential tremor 07/19/2017   Elevated LFTs 05/16/2017   ASCVD (arteriosclerotic cardiovascular disease) 05/16/2017   Right carotid bruit 05/08/2017   Essential hypertension 05/21/2015   Healthcare maintenance 05/21/2015     Current Outpatient Medications  Medication Sig Dispense Refill   amLODipine (NORVASC) 10 MG tablet Take 1 tablet (10 mg total) by mouth daily. 30 tablet 2   feeding supplement (ENSURE ENLIVE /  ENSURE PLUS) LIQD Take 237 mLs by mouth 3 (three) times daily between meals. 144 mL 12   folic acid (FOLVITE) 1 MG tablet Take 1 tablet (1 mg total) by mouth daily. 100 tablet 0   hydrochlorothiazide (HYDRODIURIL) 12.5 MG tablet Take 1 tablet (12.5 mg total) by mouth daily. 30 tablet 2   irbesartan (AVAPRO) 300 MG tablet Take 1 tablet (300 mg total) by mouth daily. 30 tablet 2   metoprolol tartrate (LOPRESSOR) 25 MG tablet Take 1 tablet (25 mg total) by mouth 2 (two) times daily. 60 tablet 2   Multiple Vitamin (MULTIVITAMIN WITH MINERALS) TABS tablet Take 1 tablet by mouth every morning. 100 tablet 0   naltrexone (DEPADE) 50 MG tablet Take 1 tablet (50 mg total) by mouth daily. (Patient not taking: Reported on 02/15/2022) 90 tablet 1   nitroGLYCERIN (NITROSTAT) 0.4 MG SL tablet Place 1 tablet (0.4 mg total) under the tongue every 5 (five) minutes as needed for chest pain. 30 tablet 3   QUEtiapine (SEROQUEL) 25 MG tablet Take 1 tablet (25 mg total) by mouth at bedtime. 90 tablet 1   simvastatin (ZOCOR) 10 MG tablet Take 1 tablet (10 mg total) by mouth at bedtime. 90 tablet 1   thiamine (VITAMIN B1) 100 MG tablet Take 1 tablet (100 mg total) by mouth daily. Start taking 1 tablet daily on 02/21/22. 100 tablet 0   No current facility-administered medications for this visit.    Objective:   Physical Exam: There were no vitals filed for this visit. Constitutional: well appearing and sitting in bed, in no acute distress HENT: normocephalic atraumatic, mucous membranes moist Eyes:  pupils equal and round, conjunctiva non-erythematous Cardiovascular: regular rate with normal rhythm, no m/r/g Pulmonary/Chest: normal work of breathing on room air, lungs clear to auscultation bilaterally Abdominal: soft, non-tender, non-distended, bowel sounds present MSK: normal bulk and tone, no peripheral edema Skin: warm and dry. Neurological: alert and answering questions appropriately. Psych: appropriate mood  and affect   Assessment & Plan:   No problem-specific Assessment & Plan notes found for this encounter.    Dan Henry (Dan Henry) [place], MS3 02/28/2022, 8:20 AM

## 2022-03-02 ENCOUNTER — Encounter: Payer: Self-pay | Admitting: Student

## 2022-08-20 ENCOUNTER — Other Ambulatory Visit: Payer: Self-pay

## 2022-08-20 ENCOUNTER — Emergency Department (HOSPITAL_COMMUNITY)
Admission: EM | Admit: 2022-08-20 | Discharge: 2022-08-20 | Disposition: A | Discharge status: Home / Self Care | Payer: 59 | Attending: Emergency Medicine | Admitting: Emergency Medicine

## 2022-08-20 ENCOUNTER — Encounter (HOSPITAL_COMMUNITY): Payer: Self-pay | Admitting: Emergency Medicine

## 2022-08-20 DIAGNOSIS — I159 Secondary hypertension, unspecified: Secondary | ICD-10-CM

## 2022-08-20 DIAGNOSIS — R000 Tachycardia, unspecified: Secondary | ICD-10-CM | POA: Insufficient documentation

## 2022-08-20 DIAGNOSIS — I10 Essential (primary) hypertension: Secondary | ICD-10-CM

## 2022-08-20 DIAGNOSIS — R040 Epistaxis: Secondary | ICD-10-CM

## 2022-08-20 DIAGNOSIS — F1021 Alcohol dependence, in remission: Secondary | ICD-10-CM

## 2022-08-20 LAB — PROTIME-INR
INR: 1.2 (ref 0.8–1.2)
Prothrombin Time: 14.6 seconds (ref 11.4–15.2)

## 2022-08-20 LAB — CBC
HCT: 34.3 % — ABNORMAL LOW (ref 39.0–52.0)
Hemoglobin: 10.9 g/dL — ABNORMAL LOW (ref 13.0–17.0)
MCH: 26 pg (ref 26.0–34.0)
MCHC: 31.8 g/dL (ref 30.0–36.0)
MCV: 81.9 fL (ref 80.0–100.0)
Platelets: 223 10*3/uL (ref 150–400)
RBC: 4.19 MIL/uL — ABNORMAL LOW (ref 4.22–5.81)
RDW: 16.7 % — ABNORMAL HIGH (ref 11.5–15.5)
WBC: 8.1 10*3/uL (ref 4.0–10.5)
nRBC: 0 % (ref 0.0–0.2)

## 2022-08-20 LAB — COMPREHENSIVE METABOLIC PANEL
ALT: 38 U/L (ref 0–44)
AST: 61 U/L — ABNORMAL HIGH (ref 15–41)
Albumin: 4.1 g/dL (ref 3.5–5.0)
Alkaline Phosphatase: 68 U/L (ref 38–126)
Anion gap: 17 — ABNORMAL HIGH (ref 5–15)
BUN: 11 mg/dL (ref 8–23)
CO2: 20 mmol/L — ABNORMAL LOW (ref 22–32)
Calcium: 9 mg/dL (ref 8.9–10.3)
Chloride: 103 mmol/L (ref 98–111)
Creatinine, Ser: 0.97 mg/dL (ref 0.61–1.24)
GFR, Estimated: >60 mL/min (ref >60)
Glucose, Bld: 130 mg/dL — ABNORMAL HIGH (ref 70–99)
Potassium: 3.6 mmol/L (ref 3.5–5.1)
Sodium: 140 mmol/L (ref 135–145)
Total Bilirubin: 0.5 mg/dL (ref 0.3–1.2)
Total Protein: 8.5 g/dL — ABNORMAL HIGH (ref 6.5–8.1)

## 2022-08-20 LAB — SAMPLE TO BLOOD BANK

## 2022-08-20 LAB — APTT: aPTT: 31 seconds (ref 24–36)

## 2022-08-20 LAB — MAGNESIUM: Magnesium: 1.6 mg/dL — ABNORMAL LOW (ref 1.7–2.4)

## 2022-08-20 MED ORDER — THIAMINE HCL 100 MG/ML IJ SOLN
100.0000 mg | Freq: Every day | INTRAMUSCULAR | Status: DC
  Filled 2022-08-20: qty 2

## 2022-08-20 MED ORDER — FOLIC ACID 1 MG PO TABS
1.0000 mg | ORAL_TABLET | Freq: Every day | ORAL | Status: DC
  Administered 2022-08-20: 1 mg via ORAL
  Filled 2022-08-20: qty 1

## 2022-08-20 MED ORDER — LORAZEPAM 1 MG PO TABS
1.0000 mg | ORAL_TABLET | ORAL | Status: DC | PRN
  Administered 2022-08-20: 1 mg via ORAL
  Filled 2022-08-20: qty 1

## 2022-08-20 MED ORDER — LABETALOL HCL 5 MG/ML IV SOLN
20.0000 mg | Freq: Once | INTRAVENOUS | Status: AC
  Administered 2022-08-20: 20 mg via INTRAVENOUS
  Filled 2022-08-20: qty 4

## 2022-08-20 MED ORDER — IRBESARTAN 300 MG PO TABS: 300.0000 mg | ORAL_TABLET | Freq: Every day | ORAL | 2 refills | Status: DC

## 2022-08-20 MED ORDER — OXYMETAZOLINE HCL 0.05 % NA SOLN
3.0000 | Freq: Once | NASAL | Status: AC
  Administered 2022-08-20: 3 via NASAL
  Filled 2022-08-20: qty 30

## 2022-08-20 MED ORDER — AMLODIPINE BESYLATE 10 MG PO TABS: 10.0000 mg | ORAL_TABLET | Freq: Every day | ORAL | 2 refills | Status: DC

## 2022-08-20 MED ORDER — ONDANSETRON 4 MG PO TBDP
4.0000 mg | ORAL_TABLET | Freq: Once | ORAL | Status: AC
  Administered 2022-08-20: 4 mg via ORAL
  Filled 2022-08-20: qty 1

## 2022-08-20 MED ORDER — HYDRALAZINE HCL 25 MG PO TABS
25.0000 mg | ORAL_TABLET | Freq: Once | ORAL | Status: AC
  Administered 2022-08-20: 25 mg via ORAL
  Filled 2022-08-20: qty 1

## 2022-08-20 MED ORDER — METOPROLOL TARTRATE 25 MG PO TABS
25.0000 mg | ORAL_TABLET | Freq: Two times a day (BID) | ORAL | 2 refills | Status: DC
Start: 2022-08-20 — End: 2023-04-22

## 2022-08-20 MED ORDER — HYDROCHLOROTHIAZIDE 12.5 MG PO TABS: 12.5000 mg | ORAL_TABLET | Freq: Every day | ORAL | 2 refills | Status: DC

## 2022-08-20 MED ORDER — ADULT MULTIVITAMIN W/MINERALS CH
1.0000 | ORAL_TABLET | Freq: Every day | ORAL | Status: DC
  Administered 2022-08-20: 1 via ORAL
  Filled 2022-08-20: qty 1

## 2022-08-20 MED ORDER — MAGNESIUM OXIDE -MG SUPPLEMENT 400 (240 MG) MG PO TABS
400.0000 mg | ORAL_TABLET | ORAL | Status: AC
  Administered 2022-08-20: 400 mg via ORAL
  Filled 2022-08-20: qty 1

## 2022-08-20 MED ORDER — THIAMINE MONONITRATE 100 MG PO TABS
100.0000 mg | ORAL_TABLET | Freq: Every day | ORAL | Status: DC
  Administered 2022-08-20: 100 mg via ORAL
  Filled 2022-08-20: qty 1

## 2022-08-20 NOTE — ED Provider Notes (Signed)
[place] EMERGENCY DEPARTMENT AT [facility] Provider Note   CSN: [id number] Arrival date & time: 08/20/22  1732     History  Chief Complaint  Patient presents with   Epistaxis    Delan Ksiazek is a 72 y.o. male.  72 year old male with a history of alcohol abuse and nosebleeding who presents to the emergency department with a nosebleed.  An hour and a half prior to arrival the patient states that he has been bleeding out of his nose.  Tried holding pressure without improvement.  Denies any chest pain, shortness of breath, or dizziness at this time.  Not on any blood thinners or antiplatelets.  Does have a history of alcohol abuse with the last drink today at 3 PM.  Says that he only drinks 1 to 212 ounce beers daily but says that he does have a history of alcohol withdrawal.  Was noted to be hypertensive in triage and states that he has been out of his blood pressure meds for "a while".       Home Medications Prior to Admission medications   Medication Sig Start Date End Date Taking? Authorizing Provider  amLODipine (NORVASC) 10 MG tablet Take 1 tablet (10 mg total) by mouth daily. 08/20/22   Rondel Baton, MD  feeding supplement (ENSURE ENLIVE / ENSURE PLUS) LIQD Take 237 mLs by mouth 3 (three) times daily between meals. 02/13/20   Roylene Reason, MD  folic acid (FOLVITE) 1 MG tablet Take 1 tablet (1 mg total) by mouth daily. 02/17/22   Marrianne Mood, MD  hydrochlorothiazide (HYDRODIURIL) 12.5 MG tablet Take 1 tablet (12.5 mg total) by mouth daily. 08/20/22   Rondel Baton, MD  irbesartan (AVAPRO) 300 MG tablet Take 1 tablet (300 mg total) by mouth daily. 08/20/22   Rondel Baton, MD  metoprolol tartrate (LOPRESSOR) 25 MG tablet Take 1 tablet (25 mg total) by mouth 2 (two) times daily. 08/20/22 11/18/22  Rondel Baton, MD  Multiple Vitamin (MULTIVITAMIN WITH MINERALS) TABS tablet Take 1 tablet by mouth every morning. 02/17/22   Marrianne Mood, MD   naltrexone (DEPADE) 50 MG tablet Take 1 tablet (50 mg total) by mouth daily. Patient not taking: Reported on 02/15/2022 06/10/20   Verdene Lennert, MD  nitroGLYCERIN (NITROSTAT) 0.4 MG SL tablet Place 1 tablet (0.4 mg total) under the tongue every 5 (five) minutes as needed for chest pain. 06/10/20   Verdene Lennert, MD  QUEtiapine (SEROQUEL) 25 MG tablet Take 1 tablet (25 mg total) by mouth at bedtime. 02/17/22   Marrianne Mood, MD  simvastatin (ZOCOR) 10 MG tablet Take 1 tablet (10 mg total) by mouth at bedtime. 02/17/22   Marrianne Mood, MD  thiamine (VITAMIN B1) 100 MG tablet Take 1 tablet (100 mg total) by mouth daily. Start taking 1 tablet daily on 02/21/22. 02/22/22   Marrianne Mood, MD      Allergies    Patient has no known allergies.    Review of Systems   Review of Systems  Physical Exam Updated Vital Signs BP (!) 176/92   Pulse 93   Temp 98.8 F (37.1 C) (Oral)   Resp (!) 21   Ht  (1.651 m)   Wt 56 kg   SpO2 99%   BMI 20.54 kg/m  Physical Exam Vitals and nursing note reviewed.  Constitutional:      General: He is not in acute distress.    Appearance: He is well-developed.  HENT:     Head:  Atraumatic.     Right Ear: External ear normal.     Left Ear: External ear normal.     Nose:     Comments: Epistaxis from bilateral nares.  Was controlled with anterior pressure. Eyes:     Extraocular Movements: Extraocular movements intact.     Conjunctiva/sclera: Conjunctivae normal.     Pupils: Pupils are equal, round, and reactive to light.  Cardiovascular:     Rate and Rhythm: Regular rhythm. Tachycardia present.     Heart sounds: Normal heart sounds.  Pulmonary:     Effort: Pulmonary effort is normal. No respiratory distress.  Musculoskeletal:     Cervical back: Normal range of motion and neck supple.     Right lower leg: No edema.     Left lower leg: No edema.  Skin:    General: Skin is warm and dry.  Neurological:     Mental Status: He is alert.  Mental status is at baseline.     Comments: Somewhat anxious and tremulous appearing.  Mild tongue fasciculations.  Psychiatric:        Behavior: Behavior normal.     ED Results / Procedures / Treatments   Labs (all labs ordered are listed, but only abnormal results are displayed) Labs Reviewed  CBC - Abnormal; Notable for the following components:      Result Value   RBC 4.19 (*)    Hemoglobin 10.9 (*)    HCT 34.3 (*)    RDW 16.7 (*)    All other components within normal limits  MAGNESIUM - Abnormal; Notable for the following components:   Magnesium 1.6 (*)    All other components within normal limits  COMPREHENSIVE METABOLIC PANEL - Abnormal; Notable for the following components:   CO2 20 (*)    Glucose, Bld 130 (*)    Total Protein 8.5 (*)    AST 61 (*)    Anion gap 17 (*)    All other components within normal limits  APTT  PROTIME-INR  SAMPLE TO BLOOD BANK    EKG EKG Interpretation  Date/Time:  Saturday August 20 2022 19:48:07 EDT Ventricular Rate:  93 PR Interval:  150 QRS Duration: 106 QT Interval:  397 QTC Calculation: 494 R Axis:   30 Text Interpretation: Sinus rhythm Borderline prolonged QT interval Confirmed by Vonita Moss [phone]) on 08/20/2022 7:55:15 PM  Radiology No results found.  Procedures Procedures    Medications Ordered in ED Medications  LORazepam (ATIVAN) tablet 1-4 mg (1 mg Oral Given 08/20/22 1928)  thiamine (VITAMIN B1) tablet 100 mg (100 mg Oral Given 08/20/22 1921)    Or  thiamine (VITAMIN B1) injection 100 mg ( Intravenous See Alternative 08/20/22 1921)  folic acid (FOLVITE) tablet 1 mg (1 mg Oral Given 08/20/22 1921)  multivitamin with minerals tablet 1 tablet (1 tablet Oral Given 08/20/22 1921)  hydrALAZINE (APRESOLINE) tablet 25 mg (25 mg Oral Given 08/20/22 1800)  ondansetron (ZOFRAN-ODT) disintegrating tablet 4 mg (4 mg Oral Given 08/20/22 1758)  oxymetazoline (AFRIN) 0.05 % nasal spray 3 spray (3 sprays Each Nare Given 08/20/22  1917)  labetalol (NORMODYNE) injection 20 mg (20 mg Intravenous Given 08/20/22 1922)  magnesium oxide (MAG-OX) tablet 400 mg (400 mg Oral Given 08/20/22 2000)    ED Course/ Medical Decision Making/ A&P                            Medical Decision Making Amount and/or Complexity of Data  Reviewed Labs: ordered.  Risk OTC drugs. Prescription drug management.   Dan Henry is a 72 y.o. male with comorbidities that complicate the patient evaluation including with a history of alcohol abuse and hypertension who presents emergency department with epistaxis  Initial Ddx:  Anterior nosebleed, anemia, alcohol withdrawals, hypertension  MDM:  Patient's nosebleeding is controlled at this time.  Suspect that he had an anterior nosebleed.  Have ordered him for phenylephrine if this recurs.  No symptoms of anemia at this time we will check a CBC.  Does appear to be having mild alcohol withdrawal at this time so we will perform CIWA score and give him Ativan.  No signs of hypertensive emergency at this time and will attempt to get blood pressure control since he is severely hypertensive currently.  With a history of alcohol abuse will obtain coags which can be abnormal in liver disease.  Plan:  Labs Coags Nasal pressure Hydralazine  ED Summary/Re-evaluation:  Patient was given hydralazine with only modest improvement of his blood pressure.  Did receive IV labetalol which improved his blood pressure significantly into the 170s.  Did not have recurrence of his nosebleeding.  Hemoglobin today is 10.9 with a baseline of 11.5.  Was sent home with Afrin and hand as well as with all of his antihypertensives refilled to his pharmacy.  Magnesium was mildly low so he was given oral magnesium here in the emergency department.  This patient presents to the ED for concern of complaints listed in HPI, this involves an extensive number of treatment options, and is a complaint that carries with it a high risk of  complications and morbidity. Disposition including potential need for admission considered.   Dispo: DC Home. Return precautions discussed including, but not limited to, those listed in the AVS. Allowed pt time to ask questions which were answered fully prior to dc.  Records reviewed Outpatient Clinic Notes The following labs were independently interpreted: Chemistry and show anion gap metabolic acidosis likely due to mild alcoholic ketoacidosis I personally reviewed and interpreted cardiac monitoring: normal sinus rhythm  I personally reviewed and interpreted the pt's EKG: see above for interpretation  I have reviewed the patients home medications and made adjustments as needed Social Determinants of health:  Alcohol abuse  Final Clinical Impression(s) / ED Diagnoses Final diagnoses:  Epistaxis  Secondary hypertension    Rx / DC Orders ED Discharge Orders          Ordered    amLODipine (NORVASC) 10 MG tablet  Daily        08/20/22 2026    hydrochlorothiazide (HYDRODIURIL) 12.5 MG tablet  Daily        08/20/22 2026    irbesartan (AVAPRO) 300 MG tablet  Daily        08/20/22 2026    metoprolol tartrate (LOPRESSOR) 25 MG tablet  2 times daily        08/20/22 2026              Rondel Baton, MD 08/20/22 2116

## 2022-08-20 NOTE — ED Provider Triage Note (Signed)
Emergency Medicine Provider Triage Evaluation Note  Dan Henry , a 72 y.o. male  was evaluated in triage.  Pt complains of nosebleed for past 1.5 hours. Also with nausea. Not on AC. No other symptoms. Last drink was today.   Review of Systems  Positive: See above Negative: See above  Physical Exam  BP (!) 199/95 (BP Location: Left Arm)   Pulse 99   Temp 98.7 F (37.1 C) (Oral)   Resp 17   Ht  (1.651 m)   Wt 56 kg   SpO2 100%   BMI 20.54 kg/m  Gen:   Awake, no distress  holding pressure on nose with bleeding controlled Resp:  Normal effort  Medical Decision Making  Medically screening exam initiated at 5:51 PM.  Appropriate orders placed.  Daleon Willinger was informed that the remainder of the evaluation will be completed by another provider, this initial triage assessment does not replace that evaluation, and the importance of remaining in the ED until their evaluation is complete.   Rondel Baton, MD 08/20/22 [phone]

## 2022-08-20 NOTE — Discharge Instructions (Signed)
You were seen for your nosebleed in the emergency department.   At home, if you experience another nosebleed, hold pressure on your nose continuously for 15 minutes.  Do not stop holding pressure.  You may also use the Afrin and spray it in each nose prior to holding pressure.    Please take your blood pressure medications as prescribed.    Check your MyChart online for the results of any tests that had not resulted by the time you left the emergency department.   Follow-up with your primary doctor in 2-3 days regarding your visit.    Return immediately to the emergency department if you experience any of the following: Nosebleeding that persists despite holding pressure for 20 to 30 minutes total, or any other concerning symptoms.    Thank you for visiting our Emergency Department. It was a pleasure taking care of you today.

## 2022-08-20 NOTE — ED Notes (Signed)
Pt started vomiting in triage, nose now bleeding again.

## 2022-08-20 NOTE — ED Triage Notes (Addendum)
Pt BIB EMS from home for sudden onset nosebleed x 1 hour. Hx htn, states he has not taken meds x 3days. Bleeding controlled at this time.  EMS VS: 213/103 HR 110 98% RA CBG 116

## 2022-10-04 ENCOUNTER — Encounter: Payer: Self-pay | Admitting: *Deleted

## 2023-04-12 ENCOUNTER — Inpatient Hospital Stay (HOSPITAL_COMMUNITY): Payer: 59

## 2023-04-12 ENCOUNTER — Emergency Department (HOSPITAL_COMMUNITY): Payer: 59

## 2023-04-12 ENCOUNTER — Inpatient Hospital Stay (HOSPITAL_COMMUNITY)
Admission: EM | Admit: 2023-04-12 | Discharge: 2023-04-22 | DRG: 100 | Disposition: A | Discharge status: Skilled Nursing Facility | Payer: 59 | Attending: Student in an Organized Health Care Education/Training Program | Admitting: Student in an Organized Health Care Education/Training Program

## 2023-04-12 DIAGNOSIS — Z79899 Other long term (current) drug therapy: Secondary | ICD-10-CM | POA: Diagnosis not present

## 2023-04-12 DIAGNOSIS — F1021 Alcohol dependence, in remission: Secondary | ICD-10-CM

## 2023-04-12 DIAGNOSIS — R7303 Prediabetes: Secondary | ICD-10-CM | POA: Diagnosis present

## 2023-04-12 DIAGNOSIS — R569 Unspecified convulsions: Principal | ICD-10-CM

## 2023-04-12 DIAGNOSIS — Z823 Family history of stroke: Secondary | ICD-10-CM

## 2023-04-12 DIAGNOSIS — Z1152 Encounter for screening for COVID-19: Secondary | ICD-10-CM | POA: Diagnosis not present

## 2023-04-12 DIAGNOSIS — N179 Acute kidney failure, unspecified: Secondary | ICD-10-CM | POA: Diagnosis present

## 2023-04-12 DIAGNOSIS — I2699 Other pulmonary embolism without acute cor pulmonale: Secondary | ICD-10-CM | POA: Diagnosis present

## 2023-04-12 DIAGNOSIS — A419 Sepsis, unspecified organism: Secondary | ICD-10-CM | POA: Diagnosis not present

## 2023-04-12 DIAGNOSIS — F10231 Alcohol dependence with withdrawal delirium: Secondary | ICD-10-CM | POA: Diagnosis present

## 2023-04-12 DIAGNOSIS — E876 Hypokalemia: Secondary | ICD-10-CM | POA: Diagnosis present

## 2023-04-12 DIAGNOSIS — J9601 Acute respiratory failure with hypoxia: Secondary | ICD-10-CM | POA: Diagnosis present

## 2023-04-12 DIAGNOSIS — R1314 Dysphagia, pharyngoesophageal phase: Secondary | ICD-10-CM | POA: Diagnosis present

## 2023-04-12 DIAGNOSIS — I160 Hypertensive urgency: Secondary | ICD-10-CM | POA: Diagnosis present

## 2023-04-12 DIAGNOSIS — Y900 Blood alcohol level of less than 20 mg/100 ml: Secondary | ICD-10-CM | POA: Diagnosis present

## 2023-04-12 DIAGNOSIS — Z7982 Long term (current) use of aspirin: Secondary | ICD-10-CM | POA: Diagnosis not present

## 2023-04-12 DIAGNOSIS — E785 Hyperlipidemia, unspecified: Secondary | ICD-10-CM | POA: Diagnosis present

## 2023-04-12 DIAGNOSIS — R739 Hyperglycemia, unspecified: Secondary | ICD-10-CM | POA: Diagnosis present

## 2023-04-12 DIAGNOSIS — R911 Solitary pulmonary nodule: Secondary | ICD-10-CM | POA: Diagnosis present

## 2023-04-12 DIAGNOSIS — G40509 Epileptic seizures related to external causes, not intractable, without status epilepticus: Secondary | ICD-10-CM | POA: Diagnosis present

## 2023-04-12 DIAGNOSIS — K7682 Hepatic encephalopathy: Secondary | ICD-10-CM | POA: Diagnosis present

## 2023-04-12 DIAGNOSIS — J9691 Respiratory failure, unspecified with hypoxia: Secondary | ICD-10-CM

## 2023-04-12 DIAGNOSIS — J690 Pneumonitis due to inhalation of food and vomit: Secondary | ICD-10-CM | POA: Diagnosis not present

## 2023-04-12 DIAGNOSIS — Z7901 Long term (current) use of anticoagulants: Secondary | ICD-10-CM

## 2023-04-12 DIAGNOSIS — I2694 Multiple subsegmental pulmonary emboli without acute cor pulmonale: Secondary | ICD-10-CM | POA: Diagnosis not present

## 2023-04-12 DIAGNOSIS — F10931 Alcohol use, unspecified with withdrawal delirium: Secondary | ICD-10-CM | POA: Diagnosis not present

## 2023-04-12 DIAGNOSIS — E7220 Disorder of urea cycle metabolism, unspecified: Secondary | ICD-10-CM | POA: Diagnosis present

## 2023-04-12 DIAGNOSIS — I10 Essential (primary) hypertension: Secondary | ICD-10-CM | POA: Diagnosis present

## 2023-04-12 DIAGNOSIS — R1319 Other dysphagia: Secondary | ICD-10-CM | POA: Insufficient documentation

## 2023-04-12 DIAGNOSIS — Z86711 Personal history of pulmonary embolism: Secondary | ICD-10-CM | POA: Diagnosis not present

## 2023-04-12 DIAGNOSIS — K760 Fatty (change of) liver, not elsewhere classified: Secondary | ICD-10-CM | POA: Diagnosis present

## 2023-04-12 DIAGNOSIS — E872 Acidosis, unspecified: Secondary | ICD-10-CM | POA: Diagnosis present

## 2023-04-12 DIAGNOSIS — F419 Anxiety disorder, unspecified: Secondary | ICD-10-CM | POA: Diagnosis present

## 2023-04-12 DIAGNOSIS — Z8249 Family history of ischemic heart disease and other diseases of the circulatory system: Secondary | ICD-10-CM

## 2023-04-12 DIAGNOSIS — R7989 Other specified abnormal findings of blood chemistry: Secondary | ICD-10-CM

## 2023-04-12 DIAGNOSIS — Z751 Person awaiting admission to adequate facility elsewhere: Secondary | ICD-10-CM

## 2023-04-12 LAB — LACTIC ACID, PLASMA: Lactic Acid, Venous: >9 mmol/L (ref 0.5–1.9)

## 2023-04-12 LAB — I-STAT CHEM 8, ED
BUN: 16 mg/dL (ref 8–23)
Calcium, Ion: 1.11 mmol/L — ABNORMAL LOW (ref 1.15–1.40)
Chloride: 110 mmol/L (ref 98–111)
Creatinine, Ser: 1.7 mg/dL — ABNORMAL HIGH (ref 0.61–1.24)
Glucose, Bld: 303 mg/dL — ABNORMAL HIGH (ref 70–99)
HCT: 46 % (ref 39.0–52.0)
Hemoglobin: 15.6 g/dL (ref 13.0–17.0)
Potassium: 3.9 mmol/L (ref 3.5–5.1)
Sodium: 142 mmol/L (ref 135–145)
TCO2: 9 mmol/L — ABNORMAL LOW (ref 22–32)

## 2023-04-12 LAB — URINALYSIS, W/ REFLEX TO CULTURE (INFECTION SUSPECTED)
Bacteria, UA: NONE SEEN
Bilirubin Urine: NEGATIVE
Glucose, UA: 50 mg/dL — AB
Ketones, ur: NEGATIVE mg/dL
Leukocytes,Ua: NEGATIVE
Nitrite: NEGATIVE
Protein, ur: 100 mg/dL — AB
Specific Gravity, Urine: 1.027 (ref 1.005–1.030)
pH: 6 (ref 5.0–8.0)

## 2023-04-12 LAB — COMPREHENSIVE METABOLIC PANEL
ALT: 24 U/L (ref 0–44)
AST: 66 U/L — ABNORMAL HIGH (ref 15–41)
Albumin: 4.4 g/dL (ref 3.5–5.0)
Alkaline Phosphatase: 79 U/L (ref 38–126)
Anion gap: 30 — ABNORMAL HIGH (ref 5–15)
BUN: 14 mg/dL (ref 8–23)
CO2: 8 mmol/L — ABNORMAL LOW (ref 22–32)
Calcium: 9.4 mg/dL (ref 8.9–10.3)
Chloride: 102 mmol/L (ref 98–111)
Creatinine, Ser: 1.82 mg/dL — ABNORMAL HIGH (ref 0.61–1.24)
GFR, Estimated: 39 mL/min — ABNORMAL LOW (ref >60)
Glucose, Bld: 308 mg/dL — ABNORMAL HIGH (ref 70–99)
Potassium: 4 mmol/L (ref 3.5–5.1)
Sodium: 140 mmol/L (ref 135–145)
Total Bilirubin: 0.5 mg/dL (ref <1.2)
Total Protein: 9.2 g/dL — ABNORMAL HIGH (ref 6.5–8.1)

## 2023-04-12 LAB — CBC WITH DIFFERENTIAL/PLATELET
Abs Immature Granulocytes: 0.1 10*3/uL — ABNORMAL HIGH (ref 0.00–0.07)
Basophils Absolute: 0.1 10*3/uL (ref 0.0–0.1)
Basophils Relative: 1 %
Eosinophils Absolute: 0 10*3/uL (ref 0.0–0.5)
Eosinophils Relative: 0 %
HCT: 42.8 % (ref 39.0–52.0)
Hemoglobin: 13.1 g/dL (ref 13.0–17.0)
Immature Granulocytes: 1 %
Lymphocytes Relative: 19 %
Lymphs Abs: 1.5 10*3/uL (ref 0.7–4.0)
MCH: 27.9 pg (ref 26.0–34.0)
MCHC: 30.6 g/dL (ref 30.0–36.0)
MCV: 91.3 fL (ref 80.0–100.0)
Monocytes Absolute: 0.2 10*3/uL (ref 0.1–1.0)
Monocytes Relative: 3 %
Neutro Abs: 6.1 10*3/uL (ref 1.7–7.7)
Neutrophils Relative %: 76 %
Platelets: 262 10*3/uL (ref 150–400)
RBC: 4.69 MIL/uL (ref 4.22–5.81)
RDW: 17.5 % — ABNORMAL HIGH (ref 11.5–15.5)
WBC: 8 10*3/uL (ref 4.0–10.5)
nRBC: 0 % (ref 0.0–0.2)

## 2023-04-12 LAB — RESP PANEL BY RT-PCR (RSV, FLU A&B, COVID)  RVPGX2
Influenza A by PCR: NEGATIVE
Influenza B by PCR: NEGATIVE
Resp Syncytial Virus by PCR: NEGATIVE
SARS Coronavirus 2 by RT PCR: NEGATIVE

## 2023-04-12 LAB — TROPONIN I (HIGH SENSITIVITY): Troponin I (High Sensitivity): 31 ng/L — ABNORMAL HIGH (ref <18)

## 2023-04-12 LAB — RAPID URINE DRUG SCREEN, HOSP PERFORMED
Amphetamines: NOT DETECTED
Barbiturates: NOT DETECTED
Benzodiazepines: NOT DETECTED
Cocaine: NOT DETECTED
Opiates: NOT DETECTED
Tetrahydrocannabinol: NOT DETECTED

## 2023-04-12 LAB — AMMONIA: Ammonia: 295 umol/L — ABNORMAL HIGH (ref 9–35)

## 2023-04-12 LAB — PROTIME-INR
INR: 1.2 (ref 0.8–1.2)
Prothrombin Time: 15.7 s — ABNORMAL HIGH (ref 11.4–15.2)

## 2023-04-12 LAB — LIPASE, BLOOD: Lipase: 41 U/L (ref 11–51)

## 2023-04-12 LAB — TSH: TSH: 2.114 u[IU]/mL (ref 0.350–4.500)

## 2023-04-12 LAB — MAGNESIUM: Magnesium: 3.4 mg/dL — ABNORMAL HIGH (ref 1.7–2.4)

## 2023-04-12 LAB — ETHANOL: Alcohol, Ethyl (B): <10 mg/dL (ref <10)

## 2023-04-12 MED ORDER — SODIUM CHLORIDE 0.9 % IV BOLUS
1000.0000 mL | Freq: Once | INTRAVENOUS | Status: AC
  Administered 2023-04-12: 1000 mL via INTRAVENOUS

## 2023-04-12 MED ORDER — AMLODIPINE BESYLATE 5 MG PO TABS
10.0000 mg | ORAL_TABLET | Freq: Every day | ORAL | Status: DC
  Administered 2023-04-12 – 2023-04-19 (×8): 10 mg via ORAL
  Filled 2023-04-12 (×9): qty 2

## 2023-04-12 MED ORDER — LACTULOSE 10 GM/15ML PO SOLN
10.0000 g | Freq: Once | ORAL | Status: AC
  Administered 2023-04-13: 10 g via ORAL
  Filled 2023-04-12: qty 15

## 2023-04-12 MED ORDER — THIAMINE MONONITRATE 100 MG PO TABS: 100.0000 mg | ORAL_TABLET | Freq: Every day | ORAL | Status: DC

## 2023-04-12 MED ORDER — LORAZEPAM 2 MG/ML IJ SOLN
1.0000 mg | INTRAMUSCULAR | Status: DC | PRN
  Administered 2023-04-12 – 2023-04-13 (×2): 2 mg via INTRAVENOUS
  Filled 2023-04-12: qty 1

## 2023-04-12 MED ORDER — LORAZEPAM 2 MG/ML IJ SOLN
0.0000 mg | Freq: Four times a day (QID) | INTRAMUSCULAR | Status: DC
  Administered 2023-04-13: 4 mg via INTRAVENOUS
  Administered 2023-04-14: 1 mg via INTRAVENOUS
  Filled 2023-04-12: qty 2
  Filled 2023-04-12 (×2): qty 1

## 2023-04-12 MED ORDER — LORAZEPAM 1 MG PO TABS
1.0000 mg | ORAL_TABLET | ORAL | Status: DC | PRN
  Administered 2023-04-13: 1 mg via ORAL
  Filled 2023-04-12: qty 1

## 2023-04-12 MED ORDER — LEVETIRACETAM 500 MG/5ML IV SOLN: 2000.0000 mg | Freq: Once | INTRAVENOUS | Status: DC

## 2023-04-12 MED ORDER — ADULT MULTIVITAMIN W/MINERALS CH
1.0000 | ORAL_TABLET | Freq: Every day | ORAL | Status: DC
  Administered 2023-04-13 – 2023-04-19 (×7): 1 via ORAL
  Filled 2023-04-12 (×7): qty 1

## 2023-04-12 MED ORDER — FOLIC ACID 1 MG PO TABS
1.0000 mg | ORAL_TABLET | Freq: Every day | ORAL | Status: DC
  Administered 2023-04-13 – 2023-04-19 (×7): 1 mg via ORAL
  Filled 2023-04-12 (×7): qty 1

## 2023-04-12 MED ORDER — HYDRALAZINE HCL 20 MG/ML IJ SOLN
2.0000 mg | Freq: Once | INTRAMUSCULAR | Status: AC
  Administered 2023-04-13: 2 mg via INTRAVENOUS
  Filled 2023-04-12: qty 1

## 2023-04-12 MED ORDER — LORAZEPAM 2 MG/ML IJ SOLN: 0.0000 mg | Freq: Two times a day (BID) | INTRAMUSCULAR | Status: DC

## 2023-04-12 MED ORDER — LEVETIRACETAM IN NACL 1000 MG/100ML IV SOLN
1000.0000 mg | INTRAVENOUS | Status: AC
  Administered 2023-04-13: 1000 mg via INTRAVENOUS
  Filled 2023-04-12: qty 100

## 2023-04-12 MED ORDER — THIAMINE HCL 100 MG/ML IJ SOLN: 100.0000 mg | Freq: Every day | INTRAMUSCULAR | Status: DC

## 2023-04-12 MED ORDER — HEPARIN BOLUS VIA INFUSION
4000.0000 [IU] | Freq: Once | INTRAVENOUS | Status: AC
  Administered 2023-04-12: 4000 [IU] via INTRAVENOUS
  Filled 2023-04-12: qty 4000

## 2023-04-12 MED ORDER — HEPARIN (PORCINE) 25000 UT/250ML-% IV SOLN
1000.0000 [IU]/h | INTRAVENOUS | Status: DC
  Administered 2023-04-12: 1000 [IU]/h via INTRAVENOUS
  Filled 2023-04-12: qty 250

## 2023-04-12 MED ORDER — IOHEXOL 350 MG/ML SOLN
75.0000 mL | Freq: Once | INTRAVENOUS | Status: AC | PRN
  Administered 2023-04-12: 75 mL via INTRAVENOUS

## 2023-04-12 NOTE — ED Triage Notes (Signed)
BIB [facility] from home, per Ems patient was found at home with possible seizure like activity, completely contracted, with Ems patient was postictal and combative, he is A/Ox2 and able to answer questions with this RN. Pain 0/10. Denies any N/V

## 2023-04-12 NOTE — H&P (Cosign Needed)
Date: 04/13/2023               Patient Name:  Dan Henry MRN: [id number]  DOB: August 19, 1950 Age / Sex: 72 y.o., male   PCP: Default, Provider, MD         Medical Service: Internal Medicine Teaching Service         Attending Physician: Dr. Mercie Eon, MD      First Contact: Dr. Philomena Doheny, MD Pager [phone]    Second Contact: Dr. Rocky Morel, DO Pager [phone]         After Hours (After 5p/  First Contact Pager: [phone]  weekends / holidays): Second Contact Pager: [phone]   SUBJECTIVE   Chief Complaint: Shaking  History of Present Illness: Dan Henry is a 72 year old male with PMH of AUD w/ previous delirium tremens/multiple hospitalizations, HTN, HLD, and anxiety who presents to ED 12/11 with concerns of persistent shaking. Patient states symptoms began this morning around 10 AM. He was watching TV with his neighbor in his normal state of health when his entire body started shaking. He tried to get up but could not, and at this point, his friend called 911. Patient does not remember riding in the ambulance and the first thing he remembers was being in the ED room. Patient states his first experience with this was three weeks ago in which very similar circumstances occurred. This happened again last week. Both times patient lied down and felt better when he woke up. Patient also endorses hallucinations "just seeing things on the wall" which occurred this and last time. No prior history with hallucinations. Patient states he drinks two beers/week and his last drink was three days ago. Endorses mild HA, but denies fever, chills, SOB, chest pain/palpitations, abdominal pain, change in urinary/bowel habits, leg pain/welling. No recent travel and is not sedentary.   Medications:  Current Outpatient Medications  Medication Instructions   amLODipine (NORVASC) 10 mg, Oral, Daily   aspirin EC 81 mg, Oral, Daily, Swallow whole.   atorvastatin (LIPITOR) 20 mg, Oral, Daily    feeding supplement (ENSURE ENLIVE / ENSURE PLUS) LIQD 237 mLs, Oral, 3 times daily between meals   folic acid (FOLVITE) 1 mg, Oral, Daily   hydrochlorothiazide (HYDRODIURIL) 12.5 mg, Oral, Daily   irbesartan (AVAPRO) 300 mg, Oral, Daily   metoprolol tartrate (LOPRESSOR) 25 mg, Oral, 2 times daily   Multiple Vitamin (MULTIVITAMIN WITH MINERALS) TABS tablet 1 tablet, Oral, Every morning   naltrexone (DEPADE) 50 mg, Oral, Daily   nitroGLYCERIN (NITROSTAT) 0.4 mg, Sublingual, Every 5 min PRN   QUEtiapine (SEROQUEL) 25 mg, Oral, Daily at bedtime   thiamine (VITAMIN B1) 100 mg, Oral, Daily, Start taking 1 tablet daily on 02/21/22.    Past Medical History Past Medical History:  Diagnosis Date   Acute kidney injury (HCC) 05/08/2017   Alcohol intoxication (HCC) 11/18/2019   Alcohol withdrawal (HCC) 04/23/2019   Alcohol withdrawal delirium (HCC) 02/14/2019   Hypertension    Hypokalemia 07/19/2017   Lactic acidosis 02/20/2018   Malnutrition of moderate degree 10/24/2018   Nosebleed 04/23/2019   Rhabdomyolysis 10/26/2018    Past Surgical History Past Surgical History:  Procedure Laterality Date   NO PAST SURGERIES      Social:  Lives With: Friend at home Occupation: Retired Level of Function: Independent PCP: [facility] Substances: Endorses 2 beers/week currently (previously 6/week months ago which was the most he's ever had), no tobacco/drug use  Family History:  Family History  Problem Relation  Age of Onset   Hypertension Mother    Hypertension Father    Hypertension Sister    Hypertension Sister    Stroke Sister    Cancer Brother        throat    Allergies: Allergies as of 04/12/2023   (No Known Allergies)    Review of Systems: A complete ROS was negative except as per HPI.   OBJECTIVE:   Physical Exam: Blood pressure (!) 186/90, pulse (!) 109, temperature 97.8 F (36.6 C), temperature source Oral, resp. rate (!) 21, SpO2 100%.  Constitutional: lying in  bed, NAD Eyes: conjunctiva non-erythematous, sclera non-icteric, no nystagmus  Cardiovascular: tachycardic, regular rhythm, no m/r/g, no LEE Pulmonary/Chest: normal work of breathing on room air, lungs clear to auscultation bilaterally Abdominal: soft, non-tender, non-distended MSK: normal bulk and tone Neurological: Tremulous in bilateral UE > LE vs. Asterixis, alert & oriented x 3, 5/5 strength in bilateral upper and lower extremities Skin: warm and dry, no calf redness/tenderness   Labs: CBC    Component Value Date/Time   WBC 8.0 04/12/2023 1909   RBC 4.69 04/12/2023 1909   HGB 15.6 04/12/2023 1916   HGB 10.7 (L) 10/22/2020 1433   HCT 46.0 04/12/2023 1916   HCT 32.2 (L) 10/22/2020 1433   PLT 262 04/12/2023 1909   PLT 137 (L) 10/22/2020 1433   MCV 91.3 04/12/2023 1909   MCV 82 10/22/2020 1433   MCH 27.9 04/12/2023 1909   MCHC 30.6 04/12/2023 1909   RDW 17.5 (H) 04/12/2023 1909   RDW 17.0 (H) 10/22/2020 1433   LYMPHSABS 1.5 04/12/2023 1909   LYMPHSABS 0.8 02/16/2018 1640   MONOABS 0.2 04/12/2023 1909   EOSABS 0.0 04/12/2023 1909   EOSABS 0.0 02/16/2018 1640   BASOSABS 0.1 04/12/2023 1909   BASOSABS 0.0 02/16/2018 1640     CMP     Component Value Date/Time   NA 142 04/12/2023 1916   NA 144 10/22/2020 1433   K 3.9 04/12/2023 1916   CL 110 04/12/2023 1916   CO2 8 (L) 04/12/2023 1909   GLUCOSE 303 (H) 04/12/2023 1916   BUN 16 04/12/2023 1916   BUN 15 10/22/2020 1433   CREATININE 1.70 (H) 04/12/2023 1916   CALCIUM 9.4 04/12/2023 1909   PROT 9.2 (H) 04/12/2023 1909   PROT 7.0 10/22/2020 1433   ALBUMIN 4.4 04/12/2023 1909   ALBUMIN 4.5 10/22/2020 1433   AST 66 (H) 04/12/2023 1909   ALT 24 04/12/2023 1909   ALKPHOS 79 04/12/2023 1909   BILITOT 0.5 04/12/2023 1909   BILITOT 0.4 10/22/2020 1433   GFRNONAA 39 (L) 04/12/2023 1909   GFRAA 98 06/10/2020 1509    Imaging:  EKG: personally reviewed my interpretation is sinus tachycardia, LVH, ST-depression in inferior  leads, peaked T-waves, prolonged QTc  ASSESSMENT & PLAN:   Assessment & Plan by Problem: Principal Problem:   Acute pulmonary embolism (HCC) Active Problems:   Seizure (HCC)   Tyshun Batton is a 72 y.o.male with PMH of AUD w/ previous delirium tremens/multiple hospitalizations, HTN, HLD, and anxiety who presents to ED 12/11 with concerns of persistent shaking.   #Acute Bilateral Pulmonary Embolism CTA Chest shows: 1. Right lower lobe segmental branch point arterial embolus in the anterior basal segment, extending from the branch point into 2 subsegmental arteries. 2. Left lower lobe posterior basal subsegmental embolus. 3. Small overall clot burden with no findings suggesting acute right heart strain. 4. Mild cardiomegaly with left ventricular wall and septal hypertrophy. 5. Patchy subpleural  ground-glass disease in the anterolateral aspect of the right middle lobe which could be due to pneumonitis, chronic change or a small-vessel infarct although no emboli were identified in the right middle lobe. 6. 7 mm noncalcified nodule in the right lower lobe. 7. Small hiatal hernia. 8. Moderate hepatic steatosis. 9. Asymmetric subareolar gynecomastia on the left.   Patient with tachycardia, tachypnea, hypertensive, but saturating well on RA. Patient given Heparin bolus and started on Heparin gtt in ED. Patient denies calf pain/swelling, personal/family history of clots, recent travel, and sedentary lifestyle.  -Continue Heparin gtt per pharmacy -Order bilateral LE doppler US -Order TTE to further assess potential right heart strain -Monitor telemetry/vitals -Consider hypercoagulability work-up  #Seizure 2/2 Alcohol Withdrawal Patient states he currently drinks 2 beer/week and the most he's ever had is 6/week. However, per chart review, patient has extensive history with AUD including withdrawal/DT with multiple ED visits/admissions over the years. Patient is also prescribed  Naltrexone which he last took one month ago. On ED arrival patient was tachycardic, tachypneic with SBP 180-210's. On physical exam, patient is tremulous in bilateral UE > (vs. Asterixis - see below) LE. No diaphoresis appreciated. ETOH level undetectable and patient states his last drink was 3 days ago. Also endorses new-onset hallucinations (although this has also been well documented in previous admissions). Hallucinations were thought to be 2/2 alcohol withdrawal but patient was also instructed to make an appointment with opthalmology to further evaluate (never done). In the ED patient seen by Neurology who also attributed seizure to alcohol withdrawal.  -Order 2 g IV Keppra per Neurology -CIWA w/ Ativan -Order CK -Folic Acid, Thiamine, MV -Alcohol cessation counseling when appropriate  #Hyperammonemia Ammonia = 295. Patient endorses confusion/no memory of ambulance but is currently A&O X 4. Physical exam shows diffuse tremulousness but asterixis cannot be ruled out. Transient altered mental status seems most likely postictal. CTA and follow-up US showed hepatic steatosis. [place] could not rule out superimposed inflammation/fibrosis. FIB-4 = 3.70 ("advanced fibrosis likely"). In regards to synthetic function of liver, albumin wnl and PT/INR = 15.7 (mildly prolonged) and 1.2 respectively. -Order Lactulose  #AGMA AG = 30, Bicarb = 8. This is most likely attributed to lactic acidosis = > 9.0 in the setting of seizures/alcohol withdrawal. UDS negative. Patient given NS bolus in ED. Anticipate down-trending of AG with resolution of above. UA did show positive dipstick hematuria with negative RBC so there could be a degree of rhabdomyolysis given presentation. -Order CK -Trend LA  #AKI Creatinine = 1.82 > 1.70 with baseline ~ 1.0. Unclear etiology but most likely 2/2 to complications related to above. Magnesium also elevated  = 3.4. Patient received NS bolus.  -Order CK -Trend BMP/Mg  #Hypertensive  Urgency Patient presents with SBP > 180 and during examination was as high as 210. -Order IV Hydralazine 2 g -Continue home Amlodipine -Holding home Avapro and Hydrochlorothiazide in the setting of AKI -Trend Vitals  #Hyperglycemia CBG in ED = 308. UA with glucosuria. No A1c on file. -Order A1c  #Chronic Conditions  #HLD Hold home Lipitor pending CK.   Diet: Heart Healthy VTE: Heparin Code: Full  Dispo: Admit patient to Inpatient with expected length of stay greater than 2 midnights.  Signed: Carmina Miller, DO Internal Medicine Resident PGY-1 04/13/2023, 12:02 AM

## 2023-04-12 NOTE — Progress Notes (Signed)
ANTICOAGULATION CONSULT NOTE - Initial Consult  Pharmacy Consult for Heparin Indication: pulmonary embolus  No Known Allergies  Patient Measurements:   Heparin Dosing Weight: 55 kg  Vital Signs: Temp: 97.8 F (36.6 C) (12/11 1842) Temp Source: Oral (12/11 1842) BP: 162/74 (12/11 1945) Pulse Rate: 104 (12/11 1945)  Labs: Recent Labs    04/12/23 1909 04/12/23 1916 04/12/23 1918  HGB 13.1 15.6  --   HCT 42.8 46.0  --   PLT 262  --   --   LABPROT  --   --  15.7*  INR  --   --  1.2  CREATININE 1.82* 1.70*  --   TROPONINIHS 31*  --   --     CrCl cannot be calculated (Unknown ideal weight.).   Medical History: Past Medical History:  Diagnosis Date   Acute kidney injury (HCC) 05/08/2017   Alcohol intoxication (HCC) 11/18/2019   Alcohol withdrawal (HCC) 04/23/2019   Alcohol withdrawal delirium (HCC) 02/14/2019   Hypertension    Hypokalemia 07/19/2017   Lactic acidosis 02/20/2018   Malnutrition of moderate degree 10/24/2018   Nosebleed 04/23/2019   Rhabdomyolysis 10/26/2018    Medications:  (Not in a hospital admission)  Scheduled:  Infusions:  PRN:   Assessment: 72 yom with a history of HTN, EtOH use disorder, previous tremor. Patient is presenting with seizure vs syncope. Heparin per pharmacy consult placed for pulmonary embolus.  CT imaging with PE  Patient is not on anticoagulation prior to arrival.  Hgb 15.6; plt 262 PT/INR 15.7/1.2  Goal of Therapy:  Heparin level 0.3-0.7 units/ml Monitor platelets by anticoagulation protocol: Yes   Plan:  Give IV heparin 4000 units bolus x 1 Start heparin infusion at 1000 units/hr Check anti-Xa level in 8 hours and daily while on heparin Continue to monitor H&H and platelets  Delmar Landau, PharmD, BCPS 04/12/2023 9:48 PM ED Clinical Pharmacist -  [phone]

## 2023-04-12 NOTE — Hospital Course (Addendum)
Principal Problem:   Acute pulmonary embolism (HCC) Active Problems:   Alcohol use disorder, severe, in early remission (HCC)   Seizure (HCC)   Alcohol withdrawal syndrome, with delirium (HCC)   AKI (acute kidney injury) (HCC)   Sepsis (HCC)   Hypoxic respiratory failure (HCC)   Aspiration pneumonia (HCC)  Resolved Problems:   * No resolved hospital problems. *  Consults:***  Procedures:***  Follow-up items: -slp recommendations  Patient instructions:  - You were seen for seizures related to your alcohol withdrawal. You were started on a medication (Naltrexone) that will help you with alcohol cravings.  - Please be aware that you are at risk for alcohol withdrawal, and starting alcohol consumption and stopping suddenly can lead to you having seizures, nausea, vomiting, and shaking again.  - Please consider joining Alcoholics Anonymous support group.               - [address] [place], [place] [address]              - Phone: [phone] - You were also found to have a pulmonary embolus, or lung clot. You were started on a medication that will help treat it. It is very important that you continue to take this medication when you leave the hospital, it is called Eliquis. You will take 10 mg twice a day until 12/18, then 5 mg twice a day for the rest of the time. You will need to follow up with your primary care doctor at [facility] street for refills.  - Your blood pressure was elevated when you were in the hospital. We restarted some of your blood pressure medications to help lower it. You will need to continue taking amlodipine 10 mg daily, metoprolol 25 mg twice daily, irbesartan 300 mg daily.  -We have discontinued your blood pressure, medication (hydrochlorothiazide). Please do not restart your home hydrochlorothiazide unless instructed by your PCP   - You will need to continue taking your cholesterol medication, atorvastatin 20 mg daily. - On your chest imaging, they found a small area  on the right lung that may need to be followed up with repeat imaging in about a year and a half to two years to make sure it hasn't changed. Your primary care doctor at [facility] street can monitor you with repeat imaging in 1-2 years  - PLEASE call 911 or go to your nearest emergency department if you experience any sudden confusion, focal weakness, speech changes, chest pain, or sudden shortness of breath.  - We are glad you are feeling better. It was a pleasure serving you during your stay. ----------------------------------------- Seizure 2/2 Alcohol Withdrawal Given IV Keppra 2 g on admission to ED for tremulousness. Per neurology, no need for continuing antiepileptic or outpatient neurology follow up at this time unless patient has a repeat episode. Last alcohol use reported to be on 12/11. Highest CIWA since admission of 22, CIWA of 1 due to anxiety on day on discharge.   Patient consuming 3 beers daily, with last drink reported to be on day of admission. Patient initially endorsed N/V and visual hallucinations. No asterixis on exam. No visual or auditory hallucinations reported since admission. Anxiety, N/V improved. Started on CIWA with ativan protocol, folic acid, MVA and thiamine at higher dose.    CK initially elevated to 5874, improved to 1,836 on 04/15/23 s/p given IV fluids and good oral intake. Patient expressed interest in alcohol detoxification and stopping his alcohol use outside of the hospital, started on  librium taper 12/13. Per patient, no more alcohol left at home. Received information on AA, which he will follow up on his own at discharge.    Acute Bilateral Unprovoked Pulmonary Embolism TTE unremarkable. No evidence of DVT in lower extremities on bilateral LE doppler US per preliminary report. Started on IV heparin gtt on admission, and transitioned to DOAC 12/12. Patient will continue on Eliquis 10 mg BID for 7 days (12/12-12/18), then Eliquis 5 mg BID at discharge.       Hypokalemia Patient required repletion with oral potassium based on morning labs. Potassium on BMP this morning 4.1.    Hypertensive Urgency HTN Patient initially presented with SBP > 180, as high as 210 since admission. Given IV hydralazine 2 g x 1 dose. History of HTN, home regimen includes amlodipine 10 mg daily, hydrochlorothiazide 12.5 mg daily, irbesartan 300 mg daily and metoprolol 25 mg BID, not on meds for past month. Resumed home amlodipine 10 mg daily 12/11 with BP and HR remaining elevated, so patient was also resumed on metoprolol 25 mg BID on 12/13. Resumed irbesartan 300 mg daily on 12/15. Discharged on amlodipine 10 mg daily, irbesartan 300 mg daily and metoprolol 25 mg BID.hydrochlorothiazide was discontinued at discharge, patient's blood pressure was 118/70 on day of discharge.   Hyperammonemia Ammonia of 295 on admission. Patient denies confusion, initially alert and oriented x 4. Only oriented to safe and place after receiving Ativan. Admission physical exam showing diffuse tremulousness, which improved with ativan administration. No concerns for asterixis throughout admission. CTA and follow-up US showed hepatic steatosis. [place] could not rule out superimposed inflammation/fibrosis. FIB-4 = 3.70 (advanced fibrosis likely). Albumin WNL and PT/INR = 15.7, 1.2. Given 1x PO lactulose 10 g. Repeat ammonia level shows resolution with a level of <10.   AKI- resolved Elevated CK, concern for Rhabdomyolysis- improving Baseline Cr ~1.0 (0.97 7 months ago, 1.02 about a year ago). Admission Cr 1.82, Cr stable at 1.26 on day of discharge.    AGMA- resolved Anion gap of 30, Bicarb of 8, and lactic acid of 9.0 on admission in the setting of seizures due to alcohol withdrawal. UDS negative. Repeat lactic acid 1.0 s/p 1L NS x 2 in ED. Anion gap WNL 12/12 to date of discharge.   Hyperglycemia- resolved CBG in ED 308. UA with glucosuria. Hgb A1c 5.9% in prediabetes range. CBGs remained <180 for  the remainder of the admission.    Elevated Troponin- peaked ECG on admission showing ST depression in inferior leads. Patient denied chest pain. Troponin I 31 > 207 > 209 > 149 peaked.    RLL Nodule Incidental finding on CTA chest: 7 mm noncalcified nodule in the right lower lobe. Will need outpatient follow up. If patient is high risk for malignancy, Consider non-contrast Chest CT at 18-24 months if patient is at high risk for malignancy, if low risk Chest CT is optional.    HLD Chronic. Held home atorvastatin 20 mg daily on admission due to CK/concern for rhabdomyolysis. Resumed atorvastatin 20 mg daily on ***.     ----- Seizure 2/2 Alcohol Withdrawal - Discharged on librium taper. Patient on Naltrexone 50 mg daily at home. Recommended outpatient support with Alcoholics Anonymous as patient expressed interest in quitting. Please help determine if patient finished taper and has stopped his alcohol use and is using Naltrexone for cravings.  SNF/Home therapy - Patient assessed by PT and OT, determined to benefit from RW for ambulation assistance. Please follow up that patient has the  RW and is receiving services.   Acute Bilateral Unprovoked Pulmonary Embolism - Patient discharged with Eliquis loading dose 10 mg BID, to continue Eliquis 5 mg BID. Please ensure patient is taking consistently, no new concerns for DVT or clots, and that he has sufficient refills.    Hypertensive Urgency HTN - Patient's blood pressure persistently elevated. Patient discharged on amlodipine 10 mg daily, metoprolol 25 mg twice daily and irbesartan 300 mg daily. Please check BP outpatient and adjust antihypertensive therapy as needed.   HLD - Last lipid panel 2 years ago. Patient resumed on atorvastatin 20 mg daily once CK decreased. Please ensure medication adherence.    RLL Nodule - Incidental finding on chest CT. Consider non-contrast Chest CT at 18-24 months if patient is at high risk for malignancy, if  low risk Chest CT is optional.

## 2023-04-12 NOTE — ED Provider Notes (Signed)
[facility] EMERGENCY DEPARTMENT AT [facility] Provider Note   CSN: [id number] Arrival date & time: 04/12/23  1832     History  Chief Complaint  Patient presents with   Seizures    Dan Henry is a 72 y.o. male.  The history is provided by the patient and medical records. No language interpreter was used.  Seizures Seizure activity on arrival: no   Seizure type:  Unable to specify Preceding symptoms: no headache   Initial focality:  None Episode characteristics: confusion (per eMS report) and stiffening   Return to baseline: yes   Timing:  Unable to specify Recent head injury:  No recent head injuries PTA treatment:  None History of seizures: no        Home Medications Prior to Admission medications   Medication Sig Start Date End Date Taking? Authorizing Provider  amLODipine (NORVASC) 10 MG tablet Take 1 tablet (10 mg total) by mouth daily. 08/20/22   Rondel Baton, MD  feeding supplement (ENSURE ENLIVE / ENSURE PLUS) LIQD Take 237 mLs by mouth 3 (three) times daily between meals. 02/13/20   Roylene Reason, MD  folic acid (FOLVITE) 1 MG tablet Take 1 tablet (1 mg total) by mouth daily. 02/17/22   Marrianne Mood, MD  hydrochlorothiazide (HYDRODIURIL) 12.5 MG tablet Take 1 tablet (12.5 mg total) by mouth daily. 08/20/22   Rondel Baton, MD  irbesartan (AVAPRO) 300 MG tablet Take 1 tablet (300 mg total) by mouth daily. 08/20/22   Rondel Baton, MD  metoprolol tartrate (LOPRESSOR) 25 MG tablet Take 1 tablet (25 mg total) by mouth 2 (two) times daily. 08/20/22 11/18/22  Rondel Baton, MD  Multiple Vitamin (MULTIVITAMIN WITH MINERALS) TABS tablet Take 1 tablet by mouth every morning. 02/17/22   Marrianne Mood, MD  naltrexone (DEPADE) 50 MG tablet Take 1 tablet (50 mg total) by mouth daily. Patient not taking: Reported on 02/15/2022 06/10/20   Verdene Lennert, MD  nitroGLYCERIN (NITROSTAT) 0.4 MG SL tablet Place 1 tablet (0.4 mg total) under  the tongue every 5 (five) minutes as needed for chest pain. 06/10/20   Verdene Lennert, MD  QUEtiapine (SEROQUEL) 25 MG tablet Take 1 tablet (25 mg total) by mouth at bedtime. 02/17/22   Marrianne Mood, MD  simvastatin (ZOCOR) 10 MG tablet Take 1 tablet (10 mg total) by mouth at bedtime. 02/17/22   Marrianne Mood, MD  thiamine (VITAMIN B1) 100 MG tablet Take 1 tablet (100 mg total) by mouth daily. Start taking 1 tablet daily on 02/21/22. 02/22/22   Marrianne Mood, MD      Allergies    Patient has no known allergies.    Review of Systems   Review of Systems  Constitutional:  Positive for fatigue. Negative for chills and fever.  HENT:  Negative for congestion.   Eyes:  Negative for visual disturbance.  Respiratory:  Positive for shortness of breath. Negative for cough, chest tightness and wheezing.   Cardiovascular:  Negative for chest pain and palpitations.  Gastrointestinal:  Negative for abdominal pain, constipation, diarrhea, nausea and vomiting.  Genitourinary:  Negative for dysuria and flank pain.  Musculoskeletal:  Negative for back pain, neck pain and neck stiffness.  Skin:  Negative for rash and wound.  Neurological:  Positive for seizures. Negative for dizziness, speech difficulty, weakness, light-headedness, numbness and headaches.  Psychiatric/Behavioral:  Negative for agitation and confusion.   All other systems reviewed and are negative.   Physical Exam Updated Vital Signs BP (!) 183/86 (  BP Location: Right Arm)   Pulse (!) 131   Temp 97.8 F (36.6 C) (Oral)   Resp (!) 31   SpO2 99%  Physical Exam Vitals and nursing note reviewed.  Constitutional:      General: He is not in acute distress.    Appearance: He is well-developed. He is ill-appearing. He is not toxic-appearing or diaphoretic.  HENT:     Head: Normocephalic and atraumatic.     Nose: No congestion or rhinorrhea.     Mouth/Throat:     Pharynx: No oropharyngeal exudate or posterior oropharyngeal  erythema.  Eyes:     Extraocular Movements: Extraocular movements intact.     Conjunctiva/sclera: Conjunctivae normal.     Pupils: Pupils are equal, round, and reactive to light.  Cardiovascular:     Rate and Rhythm: Regular rhythm. Tachycardia present.     Heart sounds: No murmur heard. Pulmonary:     Effort: Pulmonary effort is normal. No respiratory distress.     Breath sounds: Normal breath sounds. No wheezing, rhonchi or rales.  Chest:     Chest wall: No tenderness.  Abdominal:     General: Abdomen is flat. There is no distension.     Palpations: Abdomen is soft.     Tenderness: There is no abdominal tenderness. There is no right CVA tenderness, left CVA tenderness, guarding or rebound.  Musculoskeletal:        General: No swelling or tenderness.     Cervical back: Neck supple. No tenderness.     Right lower leg: No edema.     Left lower leg: No edema.  Skin:    General: Skin is warm and dry.     Capillary Refill: Capillary refill takes less than 2 seconds.     Findings: No erythema or rash.  Neurological:     Mental Status: He is alert.     GCS: GCS eye subscore is 4. GCS verbal subscore is 5. GCS motor subscore is 6.     Sensory: No sensory deficit.     Motor: Tremor present. No abnormal muscle tone.  Psychiatric:        Mood and Affect: Mood normal.     ED Results / Procedures / Treatments   Labs (all labs ordered are listed, but only abnormal results are displayed) Labs Reviewed  CBC WITH DIFFERENTIAL/PLATELET - Abnormal; Notable for the following components:      Result Value   RDW 17.5 (*)    Abs Immature Granulocytes 0.10 (*)    All other components within normal limits  COMPREHENSIVE METABOLIC PANEL - Abnormal; Notable for the following components:   CO2 8 (*)    Glucose, Bld 308 (*)    Creatinine, Ser 1.82 (*)    Total Protein 9.2 (*)    AST 66 (*)    GFR, Estimated 39 (*)    Anion gap 30 (*)    All other components within normal limits  LACTIC  ACID, PLASMA - Abnormal; Notable for the following components:   Lactic Acid, Venous >9.0 (*)    All other components within normal limits  MAGNESIUM - Abnormal; Notable for the following components:   Magnesium 3.4 (*)    All other components within normal limits  AMMONIA - Abnormal; Notable for the following components:   Ammonia 295 (*)    All other components within normal limits  URINALYSIS, W/ REFLEX TO CULTURE (INFECTION SUSPECTED) - Abnormal; Notable for the following components:   Color, Urine STRAW (*)  Glucose, UA 50 (*)    Hgb urine dipstick MODERATE (*)    Protein, ur 100 (*)    All other components within normal limits  PROTIME-INR - Abnormal; Notable for the following components:   Prothrombin Time 15.7 (*)    All other components within normal limits  I-STAT CHEM 8, ED - Abnormal; Notable for the following components:   Creatinine, Ser 1.70 (*)    Glucose, Bld 303 (*)    Calcium, Ion 1.11 (*)    TCO2 9 (*)    All other components within normal limits  TROPONIN I (HIGH SENSITIVITY) - Abnormal; Notable for the following components:   Troponin I (High Sensitivity) 31 (*)    All other components within normal limits  RESP PANEL BY RT-PCR (RSV, FLU A&B, COVID)  RVPGX2  TSH  ETHANOL  RAPID URINE DRUG SCREEN, HOSP PERFORMED  LIPASE, BLOOD  LACTIC ACID, PLASMA  HEPARIN LEVEL (UNFRACTIONATED)  TROPONIN I (HIGH SENSITIVITY)    EKG EKG Interpretation Date/Time:  Wednesday April 12 2023 18:43:25 EST Ventricular Rate:  129 PR Interval:  84 QRS Duration:  103 QT Interval:  328 QTC Calculation: 481 R Axis:   16  Text Interpretation: Sinus tachycardia Probable left ventricular hypertrophy ST depr, consider ischemia, inferior leads Borderline prolonged QT interval when compared to prior, very sharp t waves No STEMI Confirmed by Theda Belfast ([id number]) on 04/12/2023 6:51:20 PM  Radiology CT Angio Chest PE W and/or Wo Contrast  Addendum Date: 04/12/2023    ADDENDUM REPORT: 04/12/2023 21:21 ADDENDUM: Critical Value/emergent results were called by telephone at the time of interpretation on 04/12/2023 at 9:13 pm to provider [facility] , who verbally acknowledged these results. Electronically Signed   By: Almira Bar M.D.   On: 04/12/2023 21:21   Result Date: 04/12/2023 CLINICAL DATA:  Shortness of breath, tachycardia, and syncopal episode versus seizure. Pulmonary embolism suspected. EXAM: CT ANGIOGRAPHY CHEST WITH CONTRAST TECHNIQUE: Multidetector CT imaging of the chest was performed using the standard protocol during bolus administration of intravenous contrast. Multiplanar CT image reconstructions and MIPs were obtained to evaluate the vascular anatomy. RADIATION DOSE REDUCTION: This exam was performed according to the departmental dose-optimization program which includes automated exposure control, adjustment of the mA and/or kV according to patient size and/or use of iterative reconstruction technique. CONTRAST:  75mL OMNIPAQUE IOHEXOL 350 MG/ML SOLN COMPARISON:  CT abdomen and pelvis with IV contrast 10/18/2020. No prior chest CT. Portable chest was obtained earlier today, portable chest comparison 02/17/2022. FINDINGS: Cardiovascular: There is a right lower lobe segmental branch point arterial embolus in the anterior basal segment, extending from the branch point into 2 subsegmental arteries, best seen on 9: 97-104. In the left lower lobe there is a posterior basal subsegmental embolus on 9: 110-111. No further emboli are seen. The pulmonary arteries are normal caliber. There are no findings suggesting acute right heart strain. There is mild cardiomegaly with left ventricular wall and septal hypertrophy. There are no visible coronary calcifications, no substantial pericardial effusion. The thoracic aorta and great vessels are normal. Pulmonary veins are nondistended. Mediastinum/Nodes: No enlarged mediastinal, hilar, or axillary lymph nodes.  Thyroid gland, trachea, and esophagus demonstrate no significant findings. There is a small hiatal hernia. Lungs/Pleura: There is mild posterior atelectasis in the lung bases, slight reticulated scarring in the apices. There is patchy subpleural ground-glass disease in the anterolateral aspect of the right middle lobe, which could be due to pneumonitis, chronic change or a small-vessel infarct although no emboli  were identified in the right middle lobe. There is a 7 mm noncalcified nodule in the right lower lobe on 3:88. No further nodules are seen. No pleural effusion. Upper Abdomen: The liver again noted to be moderately steatotic. No acute upper abdominal findings. Musculoskeletal: No significant chest wall abnormality. There is asymmetric subareolar gynecomastia on the left. No acute or significant osseous findings. Review of the MIP images confirms the above findings. IMPRESSION: 1. Right lower lobe segmental branch point arterial embolus in the anterior basal segment, extending from the branch point into 2 subsegmental arteries. 2. Left lower lobe posterior basal subsegmental embolus. 3. Small overall clot burden with no findings suggesting acute right heart strain. 4. Mild cardiomegaly with left ventricular wall and septal hypertrophy. 5. Patchy subpleural ground-glass disease in the anterolateral aspect of the right middle lobe which could be due to pneumonitis, chronic change or a small-vessel infarct although no emboli were identified in the right middle lobe. 6. 7 mm noncalcified nodule in the right lower lobe. Per Fleischner Society Guidelines, recommend a non-contrast Chest CT at 6-12 months. If patient is high risk for malignancy, consider an additional non-contrast Chest CT at 18-24 months. If patient is low risk for malignancy, non-contrast Chest CT at 18-24 months is optional. These guidelines do not apply to immunocompromised patients and patients with cancer. Follow up in patients with significant  comorbidities as clinically warranted. For lung cancer screening, adhere to Lung-RADS guidelines. Reference: Radiology. 2017; 284(1):228-43. 7. Small hiatal hernia. 8. Moderate hepatic steatosis. 9. Asymmetric subareolar gynecomastia on the left. 10. PRA is attempting to notify ordering provider for stat notification at the time of signing. This report will be addended once contact has been made. Electronically Signed: By: Almira Bar M.D. On: 04/12/2023 21:12   CT HEAD WO CONTRAST ( )  Result Date: 04/12/2023 CLINICAL DATA:  Mental status changes, unknown cause, suspected seizure versus syncopal episode with epistaxis. EXAM: CT HEAD WITHOUT CONTRAST TECHNIQUE: Contiguous axial images were obtained from the base of the skull through the vertex without intravenous contrast. RADIATION DOSE REDUCTION: This exam was performed according to the departmental dose-optimization program which includes automated exposure control, adjustment of the mA and/or kV according to patient size and/or use of iterative reconstruction technique. COMPARISON:  Head CT 02/15/2022. FINDINGS: Brain: There is mild-to-moderate cerebral atrophy, with atrophic ventriculomegaly and moderately advanced small vessel disease of the cerebral white matter. Cerebellum and brainstem are relatively normal in volume and normal in attenuation. No midline shift is seen. There are benign dural calcifications along the falx. No new asymmetry is seen concerning for a cortical based acute infarct, hemorrhage, mass or mass effect. Clear basal cisterns. Vascular: No hyperdense vessel or unexpected calcification. Skull: Negative for fracture or focal lesions. Sinuses/Orbits: There is membrane disease in the ethmoid and maxillary sinuses. The sphenoid and frontal sinus, mastoid air cells, and middle ears are clear. Midline nasal septum. Negative orbits. Other: None. IMPRESSION: 1. No acute intracranial CT findings or interval changes. 2. Atrophy and  small-vessel disease. 3. Ethmoid and maxillary sinus membrane disease without fluid levels or wall erosion. Electronically Signed   By: Almira Bar M.D.   On: 04/12/2023 21:21   DG Chest Portable 1 View  Result Date: 04/12/2023 CLINICAL DATA:  Syncopal episode versus seizure with shortness of breath. EXAM: PORTABLE CHEST 1 VIEW COMPARISON:  Portable chest 02/17/2022, contemporaneous CTA chest. FINDINGS: The heart size and mediastinal contours are within normal limits. Both lungs are radiographically clear. Contemporaneous CTA chest demonstrated  a ground-glass infiltrate in the right middle lobe which is not radiographically visible. Follow-up CT recommended to ensure clearing. There is no substantial pleural effusion. Multiple overlying monitor wires. The visualized skeletal structures are unremarkable. IMPRESSION: 1. No radiographic acute chest findings. 2. Contemporaneous CTA demonstrated a ground-glass infiltrate in the right middle lobe which is not radiographically visible. Follow-up CT recommended to ensure clearing. Electronically Signed   By: Almira Bar M.D.   On: 04/12/2023 21:15    Procedures Procedures    Medications Ordered in ED Medications  heparin ADULT infusion 100 units/mL (25000 units/280mL) (1,000 Units/hr Intravenous New Bag/Given 04/12/23 2212)  amLODipine (NORVASC) tablet 10 mg (has no administration in time range)  LORazepam (ATIVAN) tablet 1-4 mg (has no administration in time range)    Or  LORazepam (ATIVAN) injection 1-4 mg (has no administration in time range)  thiamine (VITAMIN B1) tablet 100 mg (has no administration in time range)    Or  thiamine (VITAMIN B1) injection 100 mg (has no administration in time range)  folic acid (FOLVITE) tablet 1 mg (has no administration in time range)  multivitamin with minerals tablet 1 tablet (has no administration in time range)  LORazepam (ATIVAN) injection 0-4 mg (has no administration in time range)    Followed by   LORazepam (ATIVAN) injection 0-4 mg (has no administration in time range)  hydrALAZINE (APRESOLINE) injection 2 mg (has no administration in time range)  levETIRAcetam (KEPPRA) 2,000 mg in sodium chloride 0.9 % 250 mL IVPB (has no administration in time range)  sodium chloride 0.9 % bolus 1,000 mL (0 mLs Intravenous Stopped 04/12/23 2101)  iohexol (OMNIPAQUE) 350 MG/ML injection 75 mL (75 mLs Intravenous Contrast Given 04/12/23 2041)  heparin bolus via infusion 4,000 Units (4,000 Units Intravenous Bolus from Bag 04/12/23 2212)    ED Course/ Medical Decision Making/ A&P                                 Medical Decision Making Amount and/or Complexity of Data Reviewed Labs: ordered. Radiology: ordered.  Risk Prescription drug management. Decision regarding hospitalization.    Shaya Machon is a 72 y.o. male with a past medical history significant for hypertension, documented severe alcohol use disorder, and previous tremor who presents with abnormal vital signs and possible seizure versus syncope.  According to EMS, patient was found at home with contractures in his arms bilaterally concerning for either a posturing seizure versus syncope.  During transport patient was confused and what appeared to be postictal but gradually improved.  Patient now alert and oriented and does not member what happened.  He reports he has otherwise been feeling well and specifically denies any chest pain, palpitations, nausea, vomiting, constipation, diarrhea, or urinary changes.  He does report shortness of breath.  He reports he had "2 beers" but does report he drinks heavily normally.  He reports he stopped drinking liquor months ago.  Denies any trauma.  Denies any headache, neck pain, back pain, flank pain, or abdominal pain.  Denies pain in his extremities.  Patient just feels tired and short of breath.  On my first evaluation, patient is breathing in the 30s and heart rate is in the 130s.  He is afebrile.   He is not hypoxic.  He is in no distress but is breathing quickly.  EKG shows very sharp T waves concerning for hyperkalemia.  Patient will get fluids, will get screening labs including  i-STAT Chem-8, will get troponin for the shortness of breath and the possible syncope.  With his vital signs and the shortness of breath and concerned about possible pulmonary embolism.  Will get CT PE study.  Will get other workup.  Given the patient's vital signs and this unresponsive episode, anticipate admission for further management workup is completed.        8:02 PM Lactic acid returned greater than 9.  Given his reported seizure by EMS I suspect this was related to the seizure.  9:30 PM Workup continues to return.  Patient's ammonia found to be 295 and elevated, suspect a degree of hepatic encephalopathy with his confusion.  Otherwise he does have AKI compared to prior, hyperglycemia, has normal white count and hemoglobin, doubt significant infection at this time.  EtOH undetectable and given his reported drinking history this may have been an alcohol withdrawal seizure that was suspected.  Negative for COVID/flu/RSV.  Magnesium elevated.  His CT PE study returned showing evidence of bilateral pulmonary emboli and some haziness.  CT head did not show acute bleed.  Will order heparin per pharmacy, will call neurology given the suspected seizure to see what seizure medicine they would recommend for loading, and will call for admission for the syncope/seizure in the setting of hepatic encephalopathy, AKI, pulmonary emboli, and possible alcohol withdrawal.  Otherwise patient is resting comfortably and just reports "feeling sick" but is now denying chest pain or shortness of breath.  He is maintaining oxygen saturations on room air.  Anticipate admission to medicine service.  Patient will receive a dose of Keppra at recommendation of neurology and then he will be admitted.        Final Clinical  Impression(s) / ED Diagnoses Final diagnoses:  Seizure (HCC)  Acute pulmonary embolism without acute cor pulmonale, unspecified pulmonary embolism type (HCC)  Increased ammonia level  Hepatic encephalopathy (HCC)     Clinical Impression: 1. Seizure (HCC)   2. Acute pulmonary embolism without acute cor pulmonale, unspecified pulmonary embolism type (HCC)   3. Increased ammonia level   4. Hepatic encephalopathy (HCC)     Disposition: Admit  This note was prepared with assistance of Dragon voice recognition software. Occasional wrong-word or sound-a-like substitutions may have occurred due to the inherent limitations of voice recognition software.     Nikoli Nasser, Canary Brim, MD 04/12/23 [phone]

## 2023-04-12 NOTE — ED Notes (Signed)
Patient transported to Ultrasound 

## 2023-04-13 ENCOUNTER — Inpatient Hospital Stay (HOSPITAL_COMMUNITY): Payer: 59

## 2023-04-13 ENCOUNTER — Other Ambulatory Visit: Payer: Self-pay

## 2023-04-13 ENCOUNTER — Encounter (HOSPITAL_COMMUNITY): Payer: Self-pay | Admitting: Internal Medicine

## 2023-04-13 ENCOUNTER — Telehealth (HOSPITAL_COMMUNITY): Payer: Self-pay | Admitting: Pharmacy Technician

## 2023-04-13 ENCOUNTER — Other Ambulatory Visit (HOSPITAL_COMMUNITY): Payer: Self-pay

## 2023-04-13 DIAGNOSIS — I2699 Other pulmonary embolism without acute cor pulmonale: Secondary | ICD-10-CM | POA: Diagnosis not present

## 2023-04-13 DIAGNOSIS — R569 Unspecified convulsions: Secondary | ICD-10-CM

## 2023-04-13 DIAGNOSIS — Z86711 Personal history of pulmonary embolism: Secondary | ICD-10-CM

## 2023-04-13 DIAGNOSIS — I2694 Multiple subsegmental pulmonary emboli without acute cor pulmonale: Secondary | ICD-10-CM | POA: Diagnosis not present

## 2023-04-13 LAB — PHOSPHORUS: Phosphorus: 2.5 mg/dL (ref 2.5–4.6)

## 2023-04-13 LAB — CBC
HCT: 36 % — ABNORMAL LOW (ref 39.0–52.0)
HCT: 34.8 % — ABNORMAL LOW (ref 39.0–52.0)
Hemoglobin: 12.1 g/dL — ABNORMAL LOW (ref 13.0–17.0)
Hemoglobin: 11.4 g/dL — ABNORMAL LOW (ref 13.0–17.0)
MCH: 27.9 pg (ref 26.0–34.0)
MCH: 28 pg (ref 26.0–34.0)
MCHC: 33.6 g/dL (ref 30.0–36.0)
MCHC: 32.8 g/dL (ref 30.0–36.0)
MCV: 83.1 fL (ref 80.0–100.0)
MCV: 85.5 fL (ref 80.0–100.0)
Platelets: 223 10*3/uL (ref 150–400)
Platelets: 198 10*3/uL (ref 150–400)
RBC: 4.33 MIL/uL (ref 4.22–5.81)
RBC: 4.07 MIL/uL — ABNORMAL LOW (ref 4.22–5.81)
RDW: 17.3 % — ABNORMAL HIGH (ref 11.5–15.5)
RDW: 17.4 % — ABNORMAL HIGH (ref 11.5–15.5)
WBC: 13.3 10*3/uL — ABNORMAL HIGH (ref 4.0–10.5)
WBC: 11.2 10*3/uL — ABNORMAL HIGH (ref 4.0–10.5)
nRBC: 0 % (ref 0.0–0.2)
nRBC: 0 % (ref 0.0–0.2)

## 2023-04-13 LAB — COMPREHENSIVE METABOLIC PANEL
ALT: 23 U/L (ref 0–44)
AST: 81 U/L — ABNORMAL HIGH (ref 15–41)
Albumin: 4.2 g/dL (ref 3.5–5.0)
Alkaline Phosphatase: 66 U/L (ref 38–126)
Anion gap: 15 (ref 5–15)
BUN: 14 mg/dL (ref 8–23)
CO2: 16 mmol/L — ABNORMAL LOW (ref 22–32)
Calcium: 8.1 mg/dL — ABNORMAL LOW (ref 8.9–10.3)
Chloride: 106 mmol/L (ref 98–111)
Creatinine, Ser: 1.19 mg/dL (ref 0.61–1.24)
GFR, Estimated: >60 mL/min (ref >60)
Glucose, Bld: 120 mg/dL — ABNORMAL HIGH (ref 70–99)
Potassium: 2.7 mmol/L — CL (ref 3.5–5.1)
Sodium: 137 mmol/L (ref 135–145)
Total Bilirubin: 1.2 mg/dL — ABNORMAL HIGH (ref <1.2)
Total Protein: 8.5 g/dL — ABNORMAL HIGH (ref 6.5–8.1)

## 2023-04-13 LAB — GLUCOSE, CAPILLARY
Glucose-Capillary: 88 mg/dL (ref 70–99)
Glucose-Capillary: 77 mg/dL (ref 70–99)

## 2023-04-13 LAB — TROPONIN I (HIGH SENSITIVITY)
Troponin I (High Sensitivity): 207 ng/L (ref <18)
Troponin I (High Sensitivity): 209 ng/L (ref <18)
Troponin I (High Sensitivity): 149 ng/L (ref <18)

## 2023-04-13 LAB — CK
Total CK: 3244 U/L — ABNORMAL HIGH (ref 49–397)
Total CK: 4063 U/L — ABNORMAL HIGH (ref 49–397)
Total CK: 5874 U/L — ABNORMAL HIGH (ref 49–397)

## 2023-04-13 LAB — MAGNESIUM: Magnesium: 2.4 mg/dL (ref 1.7–2.4)

## 2023-04-13 LAB — ECHOCARDIOGRAM COMPLETE
Area-P 1/2: 5.27 cm2
Calc EF: 68.3 %
S' Lateral: 2.3 cm
Single Plane A2C EF: 68.6 %
Single Plane A4C EF: 70.4 %

## 2023-04-13 LAB — HEMOGLOBIN A1C
Hgb A1c MFr Bld: 5.9 % — ABNORMAL HIGH (ref 4.8–5.6)
Mean Plasma Glucose: 122.63 mg/dL

## 2023-04-13 LAB — HEPARIN LEVEL (UNFRACTIONATED): Heparin Unfractionated: 0.7 [IU]/mL (ref 0.30–0.70)

## 2023-04-13 LAB — LACTIC ACID, PLASMA
Lactic Acid, Venous: 1 mmol/L (ref 0.5–1.9)
Lactic Acid, Venous: 2.1 mmol/L (ref 0.5–1.9)
Lactic Acid, Venous: 1.1 mmol/L (ref 0.5–1.9)

## 2023-04-13 MED ORDER — LEVETIRACETAM IN NACL 1000 MG/100ML IV SOLN
1000.0000 mg | INTRAVENOUS | Status: AC
  Administered 2023-04-13: 1000 mg via INTRAVENOUS
  Filled 2023-04-13: qty 100

## 2023-04-13 MED ORDER — THIAMINE HCL 100 MG/ML IJ SOLN
500.0000 mg | INTRAVENOUS | Status: AC
  Administered 2023-04-13 – 2023-04-17 (×5): 500 mg via INTRAVENOUS
  Filled 2023-04-13 (×6): qty 5

## 2023-04-13 MED ORDER — SODIUM CHLORIDE 0.9 % IV BOLUS
1000.0000 mL | Freq: Once | INTRAVENOUS | Status: AC
  Administered 2023-04-13: 1000 mL via INTRAVENOUS

## 2023-04-13 MED ORDER — SODIUM CHLORIDE 0.9 % IV SOLN: 2000.0000 mg | Freq: Once | INTRAVENOUS | Status: DC

## 2023-04-13 MED ORDER — APIXABAN 5 MG PO TABS: 5.0000 mg | ORAL_TABLET | Freq: Two times a day (BID) | ORAL | Status: DC

## 2023-04-13 MED ORDER — SODIUM CHLORIDE 0.9 % IV SOLN: INTRAVENOUS | Status: AC

## 2023-04-13 MED ORDER — POTASSIUM CHLORIDE CRYS ER 20 MEQ PO TBCR
40.0000 meq | EXTENDED_RELEASE_TABLET | ORAL | Status: AC
  Administered 2023-04-13 (×2): 40 meq via ORAL
  Filled 2023-04-13 (×2): qty 2

## 2023-04-13 MED ORDER — SODIUM CHLORIDE 0.9 % IV SOLN: INTRAVENOUS | Status: DC

## 2023-04-13 MED ORDER — APIXABAN 5 MG PO TABS
10.0000 mg | ORAL_TABLET | Freq: Two times a day (BID) | ORAL | Status: DC
  Administered 2023-04-13 – 2023-04-19 (×13): 10 mg via ORAL
  Filled 2023-04-13 (×13): qty 2

## 2023-04-13 NOTE — Telephone Encounter (Signed)
Pharmacy Patient Advocate Encounter  Insurance verification completed.    The patient is insured through [facility]. Patient has Medicare and is not eligible for a copay card, but may be able to apply for patient assistance, if available.    Ran test claim for Eliquis and the current 30 day co-pay is $0.00.  Ran test claim for Xarelto and the current 30 day co-pay is $0.00.  This test claim was processed through [facility]- copay amounts may vary at other pharmacies due to pharmacy/plan contracts, or as the patient moves through the different stages of their insurance plan.

## 2023-04-13 NOTE — Plan of Care (Signed)

## 2023-04-13 NOTE — Progress Notes (Signed)
ANTICOAGULATION CONSULT NOTE   Pharmacy Consult for Heparin Indication: pulmonary embolus  No Known Allergies  Patient Measurements:   Heparin Dosing Weight: 55 kg  Vital Signs: Temp: 98.8 F (37.1 C) (12/12 0520) Temp Source: Axillary (12/12 0520) BP: 137/86 (12/12 0530) Pulse Rate: 110 (12/12 0530)  Labs: Recent Labs    04/12/23 1909 04/12/23 1916 04/12/23 1918 04/13/23 0355 04/13/23 0609  HGB 13.1 15.6  --   --   --   HCT 42.8 46.0  --   --   --   PLT 262  --   --   --   --   LABPROT  --   --  15.7*  --   --   INR  --   --  1.2  --   --   HEPARINUNFRC  --   --   --   --  0.70  CREATININE 1.82* 1.70*  --   --   --   CKTOTAL  --   --   --  3,244*  --   TROPONINIHS 31*  --   --  207*  --     CrCl cannot be calculated (Unknown ideal weight.).   Medical History: Past Medical History:  Diagnosis Date   Acute kidney injury (HCC) 05/08/2017   Alcohol intoxication (HCC) 11/18/2019   Alcohol withdrawal (HCC) 04/23/2019   Alcohol withdrawal delirium (HCC) 02/14/2019   Hypertension    Hypokalemia 07/19/2017   Lactic acidosis 02/20/2018   Malnutrition of moderate degree 10/24/2018   Nosebleed 04/23/2019   Rhabdomyolysis 10/26/2018    Medications:  (Not in a hospital admission)  Scheduled:   amLODipine  10 mg Oral Daily   folic acid  1 mg Oral Daily   LORazepam  0-4 mg Intravenous Q6H   Followed by   Melene Muller ON 04/15/2023] LORazepam  0-4 mg Intravenous Q12H   multivitamin with minerals  1 tablet Oral Daily   thiamine  100 mg Oral Daily   Or   thiamine  100 mg Intravenous Daily   Infusions:   sodium chloride     heparin 1,000 Units/hr (04/12/23 2212)   PRN: LORazepam **OR** LORazepam  Assessment: 72 yom with a history of HTN, EtOH use disorder, previous tremor. Patient is presenting with seizure vs syncope. Heparin per pharmacy consult placed for pulmonary embolus.  CT imaging with PE  Patient is not on anticoagulation prior to arrival.  Hgb 15.6;  plt 262 PT/INR 15.7/1.2  12/12 AM update:  Heparin level therapeutic   Goal of Therapy:  Heparin level 0.3-0.7 units/ml Monitor platelets by anticoagulation protocol: Yes   Plan:  Cont heparin infusion at 1000 units/hr Check anti-Xa level in 8 hours and daily while on heparin Continue to monitor H&H and platelets  Abran Duke, PharmD, BCPS Clinical Pharmacist Phone: [phone]

## 2023-04-13 NOTE — Progress Notes (Signed)
EEG complete - results pending 

## 2023-04-13 NOTE — ED Notes (Signed)
Admitting provider aware of elevated troponin.

## 2023-04-13 NOTE — Evaluation (Signed)
Physical Therapy Evaluation Patient Details Name: Dan Henry MRN: [id number] DOB: Sep 09, 1950 Today's Date: 04/13/2023  History of Present Illness  Pt is a 72 y.o. male who presented 04/12/23 with concerns of persistent shaking and hallucinations. Being worked up for seizure in setting of alcohol withdrawal. Pt also found to have acute bil PE. PMH: HTN, alcohol use disorder   Clinical Impression  Pt presents with condition above and deficits mentioned below, see PT Problem List. PTA, he was independent without DME, living alone in a 1-level apartment with a level entrance. Pt reports PRN assistance available from family at d/c. Currently, pt is displaying deficits in cognition, balance, gross strength, and activity tolerance. He displays a posterior bias and is needed modA for bed mobility, transfers, and gait bouts up to ~13 ft distances with HHA. He is at high risk for falls and would be unsafe to be home alone at this time. Thus, recommending short-term inpatient rehab, < 3 hours/day. Will continue to follow acutely.      If plan is discharge home, recommend the following: A lot of help with walking and/or transfers;A lot of help with bathing/dressing/bathroom;Assistance with cooking/housework;Direct supervision/assist for medications management;Direct supervision/assist for financial management;Assist for transportation;Supervision due to cognitive status   Can travel by private vehicle   Yes    Equipment Recommendations Rolling walker (2 wheels)  Recommendations for Other Services       Functional Status Assessment Patient has had a recent decline in their functional status and demonstrates the ability to make significant improvements in function in a reasonable and predictable amount of time.     Precautions / Restrictions Precautions Precautions: Fall Restrictions Weight Bearing Restrictions Per Provider Order: No (Dr. Geraldo Pitter cleared pt to work with PT today)       Mobility  Bed Mobility Overal bed mobility: Needs Assistance Bed Mobility: Supine to Sit, Sit to Supine     Supine to sit: Mod assist, HOB elevated Sit to supine: Mod assist, HOB elevated   General bed mobility comments: ModA needed to ascend trunk to sit EOB. ModA to lift legs back to supine    Transfers Overall transfer level: Needs assistance Equipment used: 1 person hand held assist Transfers: Sit to/from Stand Sit to Stand: Mod assist           General transfer comment: ModA needed to shift weight anteriorly and gain balance standing from edge of stretcher    Ambulation/Gait Ambulation/Gait assistance: Mod assist Gait Distance (Feet): 13 Feet Assistive device: 1 person hand held assist Gait Pattern/deviations: Decreased step length - right, Decreased step length - left, Decreased stride length, Leaning posteriorly, Shuffle, Knee flexed in stance - right, Knee flexed in stance - left Gait velocity: reduced Gait velocity interpretation: <1.31 ft/sec, indicative of household ambulator   General Gait Details: Pt takes slow, small, unsteady steps anterior <> posterior with bil HHA. POsterior lean and flexed legs noted with shakiness of legs. ModA for balance  Stairs            Wheelchair Mobility     Tilt Bed    Modified Rankin (Stroke Patients Only)       Balance Overall balance assessment: Needs assistance Sitting-balance support: Feet supported Sitting balance-Leahy Scale: Fair   Postural control: Posterior lean Standing balance support: Bilateral upper extremity supported, During functional activity, Reliant on assistive device for balance Standing balance-Leahy Scale: Poor Standing balance comment: Reliant on UE support and modA, posterior lean noted  Pertinent Vitals/Pain Pain Assessment Pain Assessment: Faces Faces Pain Scale: No hurt Pain Intervention(s): Monitored during session    Home Living  Family/patient expects to be discharged to:: Private residence Living Arrangements: Alone Available Help at Discharge: Family;Available PRN/intermittently Type of Home: Apartment Home Access: Level entry       Home Layout: One level Home Equipment: BSC/3in1      Prior Function Prior Level of Function : Independent/Modified Independent             Mobility Comments: No AD ADLs Comments: uses taxi services     Extremity/Trunk Assessment   Upper Extremity Assessment Upper Extremity Assessment: Defer to OT evaluation    Lower Extremity Assessment Lower Extremity Assessment: Generalized weakness    Cervical / Trunk Assessment Cervical / Trunk Assessment: Normal  Communication   Communication Communication: No apparent difficulties  Cognition Arousal: Alert Behavior During Therapy: WFL for tasks assessed/performed Overall Cognitive Status: Impaired/Different from baseline Area of Impairment: Problem solving, Safety/judgement, Attention, Following commands                   Current Attention Level: Selective   Following Commands: Follows one step commands consistently, Follows one step commands with increased time Safety/Judgement: Decreased awareness of safety, Decreased awareness of deficits   Problem Solving: Slow processing, Requires verbal cues, Requires tactile cues General Comments: Pt slow to process cues and unaware of his posterior lean in standing, impacting his safety        General Comments      Exercises     Assessment/Plan    PT Assessment Patient needs continued PT services  PT Problem List Decreased strength;Decreased activity tolerance;Decreased mobility;Decreased balance;Decreased cognition;Decreased coordination       PT Treatment Interventions DME instruction;Gait training;Functional mobility training;Therapeutic activities;Therapeutic exercise;Balance training;Neuromuscular re-education;Cognitive remediation;Patient/family  education    PT Goals (Current goals can be found in the Care Plan section)  Acute Rehab PT Goals Patient Stated Goal: to improve PT Goal Formulation: With patient Time For Goal Achievement: 04/27/23 Potential to Achieve Goals: Good    Frequency Min 1X/week     Co-evaluation               AM-PAC PT "6 Clicks" Mobility  Outcome Measure Help needed turning from your back to your side while in a flat bed without using bedrails?: A Little Help needed moving from lying on your back to sitting on the side of a flat bed without using bedrails?: A Lot Help needed moving to and from a bed to a chair (including a wheelchair)?: A Lot Help needed standing up from a chair using your arms (e.g., wheelchair or bedside chair)?: A Lot Help needed to walk in hospital room?: Total Help needed climbing 3-5 steps with a railing? : Total 6 Click Score: 11    End of Session   Activity Tolerance: Patient tolerated treatment well Patient left: in bed;with nursing/sitter in room;Other (comment) (about to be transferred to new room)   PT Visit Diagnosis: Unsteadiness on feet (R26.81);Other abnormalities of gait and mobility (R26.89);Muscle weakness (generalized) (M62.81);Difficulty in walking, not elsewhere classified (R26.2)    Time: 6440-3474 PT Time Calculation (min) (ACUTE ONLY): 10 min   Charges:   PT Evaluation $PT Eval Moderate Complexity: 1 Mod   PT General Charges $$ ACUTE PT VISIT: 1 Visit         Virgil Benedict, PT, DPT Acute Rehabilitation Services  Office: [phone]   Bettina Gavia 04/13/2023, 5:10 PM

## 2023-04-13 NOTE — ED Notes (Signed)
EEG at bedside.

## 2023-04-13 NOTE — ED Notes (Signed)
ED TO INPATIENT HANDOFF REPORT  ED Nurse Name and Phone #: Murlean Iba Paramedic [phone]  S Name/Age/Gender Dan Henry 72 y.o. male Room/Bed: 002C/002C  Code Status   Code Status: Full Code  Home/SNF/Other Home Patient oriented to: self and place Is this baseline? No   Triage Complete: Triage complete  Chief Complaint Seizure [facility]) [R56.9]  Triage Note BIB [facility] from home, per [facility] patient was found at home with possible seizure like activity, completely contracted, with [facility] patient was postictal and combative, he is A/Ox2 and able to answer questions with this RN. Pain 0/10. Denies any N/V   Allergies No Known Allergies  Level of Care/Admitting Diagnosis ED Disposition     ED Disposition  Admit   Condition  --   Comment  Hospital Area: [facility] [[id number]]  Level of Care: Telemetry Medical [104]  May admit patient to [facility] or [facility] if equivalent level of care is available:: No  Covid Evaluation: Asymptomatic - no recent exposure (last 10 days) testing not required  Diagnosis: Seizure [facility]) [205090]  Admitting Physician: Mercie Eon [[id number]]  Attending Physician: Mercie Eon [[id number]]  Certification:: I certify this patient will need inpatient services for at least 2 midnights  Expected Medical Readiness: 04/14/2023          B Medical/Surgery History Past Medical History:  Diagnosis Date   Acute kidney injury (HCC) 05/08/2017   Alcohol intoxication (HCC) 11/18/2019   Alcohol withdrawal (HCC) 04/23/2019   Alcohol withdrawal delirium (HCC) 02/14/2019   Hypertension    Hypokalemia 07/19/2017   Lactic acidosis 02/20/2018   Malnutrition of moderate degree 10/24/2018   Nosebleed 04/23/2019   Rhabdomyolysis 10/26/2018   Past Surgical History:  Procedure Laterality Date   NO PAST SURGERIES       A IV Location/Drains/Wounds Patient Lines/Drains/Airways Status     Active Line/Drains/Airways     Name Placement  date Placement time Site Days   Peripheral IV 04/13/23 18 G Posterior;Right Forearm 04/13/23  0601  Forearm  less than 1   Peripheral IV 04/13/23 20 G Anterior;Right;Upper;Distal Arm 04/13/23  0654  Arm  less than 1            Intake/Output Last 24 hours  Intake/Output Summary (Last 24 hours) at 04/13/2023 1438 Last data filed at 04/13/2023 2725 Gross per 24 hour  Intake 117.28 ml  Output --  Net 117.28 ml    Labs/Imaging Results for orders placed or performed during the hospital encounter of 04/12/23 (from the past 48 hours)  CBC with Differential     Status: Abnormal   Collection Time: 04/12/23  7:09 PM  Result Value Ref Range   WBC 8.0 4.0 - 10.5 K/uL   RBC 4.69 4.22 - 5.81 MIL/uL   Hemoglobin 13.1 13.0 - 17.0 g/dL   HCT 36.6 44.0 - 34.7 %   MCV 91.3 80.0 - 100.0 fL   MCH 27.9 26.0 - 34.0 pg   MCHC 30.6 30.0 - 36.0 g/dL   RDW 42.5 (H) 95.6 - 38.7 %   Platelets 262 150 - 400 K/uL   nRBC 0.0 0.0 - 0.2 %   Neutrophils Relative % 76 %   Neutro Abs 6.1 1.7 - 7.7 K/uL   Lymphocytes Relative 19 %   Lymphs Abs 1.5 0.7 - 4.0 K/uL   Monocytes Relative 3 %   Monocytes Absolute 0.2 0.1 - 1.0 K/uL   Eosinophils Relative 0 %   Eosinophils Absolute 0.0 0.0 - 0.5  K/uL   Basophils Relative 1 %   Basophils Absolute 0.1 0.0 - 0.1 K/uL   Immature Granulocytes 1 %   Abs Immature Granulocytes 0.10 (H) 0.00 - 0.07 K/uL    Comment: Performed at [facility] Lab, [address], [place], [place] [address]  Comprehensive metabolic panel     Status: Abnormal   Collection Time: 04/12/23  7:09 PM  Result Value Ref Range   Sodium 140 135 - 145 mmol/L   Potassium 4.0 3.5 - 5.1 mmol/L   Chloride 102 98 - 111 mmol/L   CO2 8 (L) 22 - 32 mmol/L   Glucose, Bld 308 (H) 70 - 99 mg/dL    Comment: Glucose reference range applies only to samples taken after fasting for at least 8 hours.   BUN 14 8 - 23 mg/dL   Creatinine, Ser 5.28 (H) 0.61 - 1.24 mg/dL   Calcium 9.4 8.9 - 41.3 mg/dL   Total  Protein 9.2 (H) 6.5 - 8.1 g/dL   Albumin 4.4 3.5 - 5.0 g/dL   AST 66 (H) 15 - 41 U/L   ALT 24 0 - 44 U/L    Comment: RESULT CONFIRMED BY MANUAL DILUTION   Alkaline Phosphatase 79 38 - 126 U/L   Total Bilirubin 0.5 <1.2 mg/dL   GFR, Estimated 39 (L) >60 mL/min    Comment: (NOTE) Calculated using the CKD-EPI Creatinine Equation (2021)    Anion gap 30 (H) 5 - 15    Comment: ELECTROLYTES REPEATED TO VERIFY Performed at [facility], [address], [place], [place] [address]   TSH     Status: None   Collection Time: 04/12/23  7:09 PM  Result Value Ref Range   TSH 2.114 0.350 - 4.500 uIU/mL    Comment: Performed by a 3rd Generation assay with a functional sensitivity of <=0.01 uIU/mL. Performed at [facility], [address] [place], [place] [address]   Magnesium     Status: Abnormal   Collection Time: 04/12/23  7:09 PM  Result Value Ref Range   Magnesium 3.4 (H) 1.7 - 2.4 mg/dL    Comment: Performed at [facility], [address] [place], [place] [address]  Ammonia     Status: Abnormal   Collection Time: 04/12/23  7:09 PM  Result Value Ref Range   Ammonia 295 (H) 9 - 35 umol/L    Comment: Performed at [facility], [address], [place], [place] [address]  Ethanol     Status: None   Collection Time: 04/12/23  7:09 PM  Result Value Ref Range   Alcohol, Ethyl (B) <10 <10 mg/dL    Comment: (NOTE) Lowest detectable limit for serum alcohol is 10 mg/dL.  For medical purposes only. Performed at [facility], [address] [place], [place] [address]   Lipase, blood     Status: None   Collection Time: 04/12/23  7:09 PM  Result Value Ref Range   Lipase 41 11 - 51 U/L    Comment: Performed at [facility], [address] [place], [place] [address]  Troponin I (High Sensitivity)     Status: Abnormal   Collection Time: 04/12/23  7:09 PM  Result Value Ref Range   Troponin I (High Sensitivity) 31 (H) <18 ng/L    Comment: (NOTE) Elevated  high sensitivity troponin I (hsTnI) values and significant  changes across serial measurements may suggest ACS but many other  chronic and  acute conditions are known to elevate hsTnI results.  Refer to the Links section for chest pain algorithms and additional  guidance. Performed at [facility], [address] [place], [place] [address]   I-stat chem 8, ED (not at [facility], [facility] or [facility])     Status: Abnormal   Collection Time: 04/12/23  7:16 PM  Result Value Ref Range   Sodium 142 135 - 145 mmol/L   Potassium 3.9 3.5 - 5.1 mmol/L   Chloride 110 98 - 111 mmol/L   BUN 16 8 - 23 mg/dL   Creatinine, Ser 3.24 (H) 0.61 - 1.24 mg/dL   Glucose, Bld 401 (H) 70 - 99 mg/dL    Comment: Glucose reference range applies only to samples taken after fasting for at least 8 hours.   Calcium, Ion 1.11 (L) 1.15 - 1.40 mmol/L   TCO2 9 (L) 22 - 32 mmol/L   Hemoglobin 15.6 13.0 - 17.0 g/dL   HCT 02.7 25.3 - 66.4 %  Protime-INR     Status: Abnormal   Collection Time: 04/12/23  7:18 PM  Result Value Ref Range   Prothrombin Time 15.7 (H) 11.4 - 15.2 seconds   INR 1.2 0.8 - 1.2    Comment: (NOTE) INR goal varies based on device and disease states. Performed at [facility], [address] [place], [place] [address]   Lactic acid, plasma     Status: Abnormal   Collection Time: 04/12/23  7:19 PM  Result Value Ref Range   Lactic Acid, Venous >9.0 (HH) 0.5 - 1.9 mmol/L    Comment: CRITICAL RESULT CALLED TO, READ BACK BY AND VERIFIED WITH Rich Number, RN @ [phone] 04/12/23 BY [facility] Performed at [facility], [address] [place], [place] [address]   Resp panel by RT-PCR (RSV, Flu A&B, Covid) Anterior Nasal Swab     Status: None   Collection Time: 04/12/23  7:21 PM   Specimen: Anterior Nasal Swab  Result Value Ref Range   SARS Coronavirus 2 by RT PCR NEGATIVE NEGATIVE   Influenza A by PCR NEGATIVE NEGATIVE   Influenza B by PCR NEGATIVE NEGATIVE    Comment: (NOTE) The Xpert Xpress  SARS-CoV-2/FLU/RSV plus assay is intended as an aid in the diagnosis of influenza from Nasopharyngeal swab specimens and should not be used as a sole basis for treatment. Nasal washings and aspirates are unacceptable for Xpert Xpress SARS-CoV-2/FLU/RSV testing.  Fact Sheet for Patients: BloggerCourse.com  Fact Sheet for Healthcare Providers: SeriousBroker.it  This test is not yet approved or cleared by the [place] FDA and has been authorized for detection and/or diagnosis of SARS-CoV-2 by FDA under an Emergency Use Authorization (EUA). This EUA will remain in effect (meaning this test can be used) for the duration of the COVID-19 declaration under Section 564(b)(1) of the Act, 21 U.S.C. section 360bbb-3(b)(1), unless the authorization is terminated or revoked.     Resp Syncytial Virus by PCR NEGATIVE NEGATIVE    Comment: (NOTE) Fact Sheet for Patients: BloggerCourse.com  Fact Sheet for Healthcare Providers: SeriousBroker.it  This test is not yet approved or cleared by the [place] FDA and has been authorized for detection and/or diagnosis of SARS-CoV-2 by FDA under an Emergency Use Authorization (EUA). This EUA will remain in effect (meaning this test can be used) for the duration of the COVID-19 declaration under Section 564(b)(1) of the Act, 21 U.S.C. section 360bbb-3(b)(1), unless the authorization is terminated or revoked.  Performed at [facility], [address] [place], [place] [address]   Rapid urine drug screen (hospital performed)     Status: None   Collection Time: 04/12/23  9:43 PM  Result Value Ref Range   Opiates NONE DETECTED NONE DETECTED   Cocaine NONE DETECTED NONE DETECTED   Benzodiazepines NONE DETECTED NONE DETECTED   Amphetamines NONE DETECTED NONE DETECTED   Tetrahydrocannabinol NONE DETECTED NONE DETECTED   Barbiturates NONE  DETECTED NONE DETECTED    Comment: (NOTE) DRUG SCREEN FOR MEDICAL PURPOSES ONLY.  IF CONFIRMATION IS NEEDED FOR ANY PURPOSE, NOTIFY LAB WITHIN 5 DAYS.  LOWEST DETECTABLE LIMITS FOR URINE DRUG SCREEN Drug Class                     Cutoff (ng/mL) Amphetamine and metabolites    1000 Barbiturate and metabolites    200 Benzodiazepine                 200 Opiates and metabolites        300 Cocaine and metabolites        300 THC                            50 Performed at [facility], [address], [place], [place] [address]   Urinalysis, w/ Reflex to Culture (Infection Suspected) -Urine, Clean Catch     Status: Abnormal   Collection Time: 04/12/23  9:43 PM  Result Value Ref Range   Specimen Source URINE, CLEAN CATCH    Color, Urine STRAW (A) YELLOW   APPearance CLEAR CLEAR   Specific Gravity, Urine 1.027 1.005 - 1.030   pH 6.0 5.0 - 8.0   Glucose, UA 50 (A) NEGATIVE mg/dL   Hgb urine dipstick MODERATE (A) NEGATIVE   Bilirubin Urine NEGATIVE NEGATIVE   Ketones, ur NEGATIVE NEGATIVE mg/dL   Protein, ur 401 (A) NEGATIVE mg/dL   Nitrite NEGATIVE NEGATIVE   Leukocytes,Ua NEGATIVE NEGATIVE   RBC / HPF 0-5 0 - 5 RBC/hpf   WBC, UA 0-5 0 - 5 WBC/hpf    Comment:        Reflex urine culture not performed if WBC <=10, OR if Squamous epithelial cells >5. If Squamous epithelial cells >5 suggest recollection.    Bacteria, UA NONE SEEN NONE SEEN   Squamous Epithelial / HPF 0-5 0 - 5 /HPF   Mucus PRESENT     Comment: Performed at [facility] Lab, [address] [place], [place] [address]  Troponin I (High Sensitivity)     Status: Abnormal   Collection Time: 04/13/23  3:55 AM  Result Value Ref Range   Troponin I (High Sensitivity) 207 (HH) <18 ng/L    Comment: CRITICAL RESULT CALLED TO, READ BACK BY AND VERIFIED WITH A.METCAFF RN [phone] 04/13/2023 BY G.GANADEN (NOTE) Elevated high sensitivity troponin I (hsTnI) values and significant  changes across serial measurements may  suggest ACS but many other  chronic and acute conditions are known to elevate hsTnI results.  Refer to the "Links" section for chest pain algorithms and additional  guidance. Performed at [facility], [address], [place], [place] [address]   Lactic acid, plasma     Status: None   Collection Time: 04/13/23  3:55 AM  Result Value Ref Range   Lactic Acid, Venous 1.0 0.5 - 1.9 mmol/L    Comment: Performed at [facility], [address] [place], [place] [address]  CK     Status: Abnormal   Collection Time: 04/13/23  3:55 AM  Result Value Ref Range   Total CK 3,244 (H) 49 - 397 U/L    Comment: Performed at [facility], [address] [place], [place] [address]  Troponin I (High Sensitivity)     Status: Abnormal   Collection Time: 04/13/23  6:08 AM  Result Value Ref Range   Troponin I (High Sensitivity) 209 (HH) <18 ng/L    Comment: CRITICAL VALUE NOTED. VALUE IS CONSISTENT WITH PREVIOUSLY REPORTED/CALLED VALUE (NOTE) Elevated high sensitivity troponin I (hsTnI) values and significant  changes across serial measurements may suggest ACS but many other  chronic and acute conditions are known to elevate hsTnI results.  Refer to the "Links" section for chest pain algorithms and additional  guidance. Performed at [facility], [address] [place], [place] [address]   Lactic acid, plasma     Status: Abnormal   Collection Time: 04/13/23  6:09 AM  Result Value Ref Range   Lactic Acid, Venous 2.1 (HH) 0.5 - 1.9 mmol/L    Comment: CRITICAL RESULT CALLED TO, READ BACK BY AND VERIFIED WITH Angela Adam RN [phone] M. [facility] Performed at [facility], [address], [place], [place] [address]   Heparin level (unfractionated)     Status: None   Collection Time: 04/13/23  6:09 AM  Result Value Ref Range   Heparin Unfractionated 0.70 0.30 - 0.70 IU/mL    Comment: (NOTE) The clinical reportable range upper limit is being lowered to >1.10 to align with  the FDA approved guidance for the current laboratory assay.  If heparin results are below expected values, and patient dosage has  been confirmed, suggest follow up testing of antithrombin III levels. Performed at [facility], [address], [place], [place] [address]   Comprehensive metabolic panel     Status: Abnormal   Collection Time: 04/13/23  6:09 AM  Result Value Ref Range   Sodium 137 135 - 145 mmol/L   Potassium 2.7 (LL) 3.5 - 5.1 mmol/L    Comment: CRITICAL RESULT CALLED TO, READ BACK BY AND VERIFIED WITH A.METCALF,RN 0701 04/13/23 CLARK,S   Chloride 106 98 - 111 mmol/L   CO2 16 (L) 22 - 32 mmol/L   Glucose, Bld 120 (H) 70 - 99 mg/dL    Comment: Glucose reference range applies only to samples taken after fasting for at least 8 hours.   BUN 14 8 - 23 mg/dL   Creatinine, Ser 9.62 0.61 - 1.24 mg/dL   Calcium 8.1 (L) 8.9 - 10.3 mg/dL   Total Protein 8.5 (H) 6.5 - 8.1 g/dL   Albumin 4.2 3.5 - 5.0 g/dL   AST 81 (H) 15 - 41 U/L   ALT 23 0 - 44 U/L   Alkaline Phosphatase 66 38 - 126 U/L   Total Bilirubin 1.2 (H) <1.2 mg/dL   GFR, Estimated >95 >28 mL/min    Comment: (NOTE) Calculated using the CKD-EPI Creatinine Equation (2021)    Anion gap 15 5 - 15    Comment: Performed at [facility] Lab, [address], [place], [place] [address]  CBC     Status: Abnormal   Collection Time: 04/13/23  6:09 AM  Result Value Ref Range   WBC 13.3 (H) 4.0 - 10.5 K/uL   RBC 4.33 4.22 - 5.81 MIL/uL   Hemoglobin 12.1 (L) 13.0 - 17.0  g/dL   HCT 29.5 (L) 62.1 - 30.8 %   MCV 83.1 80.0 - 100.0 fL    Comment: REPEATED TO VERIFY QUESTIONABLE RESULTS NOTIFIED Jance Siek OF SUGGESTED RECOLLECT [id number] BY ZBEECH    MCH 27.9 26.0 - 34.0 pg   MCHC 33.6 30.0 - 36.0 g/dL   RDW 29.5 (H) 28.4 - 13.2 %   Platelets 223 150 - 400 K/uL   nRBC 0.0 0.0 - 0.2 %    Comment: Performed at [facility], [address], [place], [place] [address]  Magnesium     Status: None   Collection Time:  04/13/23  6:09 AM  Result Value Ref Range   Magnesium 2.4 1.7 - 2.4 mg/dL    Comment: Performed at [facility], [address], [place], [place] [address]  Hemoglobin A1c     Status: Abnormal   Collection Time: 04/13/23  6:09 AM  Result Value Ref Range   Hgb A1c MFr Bld 5.9 (H) 4.8 - 5.6 %    Comment: (NOTE) Pre diabetes:          5.7%-6.4%  Diabetes:              >6.4%  Glycemic control for   <7.0% adults with diabetes    Mean Plasma Glucose 122.63 mg/dL    Comment: Performed at [facility], [address] [place], [place] [address]  CK     Status: Abnormal   Collection Time: 04/13/23  8:13 AM  Result Value Ref Range   Total CK 4,063 (H) 49 - 397 U/L    Comment: Performed at [facility], [address], [place], [place] [address]  Phosphorus     Status: None   Collection Time: 04/13/23  8:13 AM  Result Value Ref Range   Phosphorus 2.5 2.5 - 4.6 mg/dL    Comment: Performed at [facility], [address], [place], [place] [address]  Troponin I (High Sensitivity)     Status: Abnormal   Collection Time: 04/13/23  8:13 AM  Result Value Ref Range   Troponin I (High Sensitivity) 149 (HH) <18 ng/L    Comment: CRITICAL VALUE NOTED. VALUE IS CONSISTENT WITH PREVIOUSLY REPORTED/CALLED VALUE (NOTE) Elevated high sensitivity troponin I (hsTnI) values and significant  changes across serial measurements may suggest ACS but many other  chronic and acute conditions are known to elevate hsTnI results.  Refer to the "Links" section for chest pain algorithms and additional  guidance. Performed at [facility], [address], [place], [place] [address]   CBC     Status: Abnormal   Collection Time: 04/13/23  8:15 AM  Result Value Ref Range   WBC 11.2 (H) 4.0 - 10.5 K/uL   RBC 4.07 (L) 4.22 - 5.81 MIL/uL   Hemoglobin 11.4 (L) 13.0 - 17.0 g/dL   HCT 33.2 (L) 95.1 - 88.4 %   MCV 85.5 80.0 - 100.0 fL   MCH 28.0 26.0 - 34.0 pg   MCHC 32.8 30.0 - 36.0 g/dL    RDW 16.6 (H) 06.3 - 15.5 %   Platelets 198 150 - 400 K/uL   nRBC 0.0 0.0 - 0.2 %    Comment: Performed at [facility] Lab, [address] [place], [place] [address]   VAS [place] LOWER EXTREMITY VENOUS (DVT) Result Date: 04/13/2023  Lower Venous DVT Study Patient Name:  THORSON MONCRIEFF  Date of Exam:   04/13/2023 Medical Rec #:  [id number]      Accession #:    [id number] Date of Birth: 24-Nov-1950      Patient Gender: M Patient Age:   15 years Exam Location:  [facility] Procedure:      VAS [place] LOWER EXTREMITY VENOUS (DVT) Referring Phys: Mercie Eon --------------------------------------------------------------------------------  Indications: Pulmonary embolism.  Comparison Study: No previous exams Performing Technologist: Jody Hill RVT, RDMS  Examination Guidelines: A complete evaluation includes B-mode imaging, spectral Doppler, color Doppler, and power Doppler as needed of all accessible portions of each vessel. Bilateral testing is considered an integral part of a complete examination. Limited examinations for reoccurring indications may be performed as noted. The reflux portion of the exam is performed with the patient in reverse Trendelenburg.  +---------+---------------+---------+-----------+----------+--------------+ RIGHT    CompressibilityPhasicitySpontaneityPropertiesThrombus Aging +---------+---------------+---------+-----------+----------+--------------+ CFV      Full           Yes      Yes                                 +---------+---------------+---------+-----------+----------+--------------+ SFJ      Full                                                        +---------+---------------+---------+-----------+----------+--------------+ FV Prox  Full           Yes      Yes                                 +---------+---------------+---------+-----------+----------+--------------+ FV Mid   Full           Yes      Yes                                  +---------+---------------+---------+-----------+----------+--------------+ FV DistalFull           Yes      Yes                                 +---------+---------------+---------+-----------+----------+--------------+ PFV      Full                                                        +---------+---------------+---------+-----------+----------+--------------+ POP      Full           Yes      Yes                                 +---------+---------------+---------+-----------+----------+--------------+ PTV      Full                                                        +---------+---------------+---------+-----------+----------+--------------+  PERO     Full                                                        +---------+---------------+---------+-----------+----------+--------------+   +---------+---------------+---------+-----------+----------+--------------+ LEFT     CompressibilityPhasicitySpontaneityPropertiesThrombus Aging +---------+---------------+---------+-----------+----------+--------------+ CFV      Full           Yes      Yes                                 +---------+---------------+---------+-----------+----------+--------------+ SFJ      Full                                                        +---------+---------------+---------+-----------+----------+--------------+ FV Prox  Full           Yes      Yes                                 +---------+---------------+---------+-----------+----------+--------------+ FV Mid   Full           Yes      Yes                                 +---------+---------------+---------+-----------+----------+--------------+ FV DistalFull           Yes      Yes                                 +---------+---------------+---------+-----------+----------+--------------+ PFV      Full                                                         +---------+---------------+---------+-----------+----------+--------------+ POP      Full           Yes      Yes                                 +---------+---------------+---------+-----------+----------+--------------+ PTV      Full                                                        +---------+---------------+---------+-----------+----------+--------------+ PERO     Full                                                        +---------+---------------+---------+-----------+----------+--------------+  Summary: BILATERAL: - No evidence of deep vein thrombosis seen in the lower extremities, bilaterally. -No evidence of popliteal cyst, bilaterally.   *See table(s) above for measurements and observations. Electronically signed by Carolynn Sayers on 04/13/2023 at 1:49:58 PM.    Final    EEG adult Result Date: 04/13/2023 Charlsie Quest, MD     04/13/2023  5:32 AM Patient Name: Jwaun Vanzante MRN: [id number] Epilepsy Attending: Charlsie Quest Referring Physician/Provider: Olegario Messier, MD Date: 04/13/2023 Duration: 22.30 mins Patient history:  72 y.o.male with PMH of AUD w/ previous delirium tremens/multiple hospitalizations, HTN, HLD, and anxiety who presents to ED 12/11 with concerns of persistent shaking. EEG to evaluate for seizure Level of alertness: Awake AEDs during EEG study: LEV, Ativan Technical aspects: This EEG study was done with scalp electrodes positioned according to the 10-20 International system of electrode placement. Electrical activity was reviewed with band pass filter of 1-70Hz , sensitivity of 7 uV/mm, display speed of 19mm/sec with a 60Hz  notched filter applied as appropriate. EEG data were recorded continuously and digitally stored.  Video monitoring was available and reviewed as appropriate. Description: The posterior dominant rhythm consists of 8 Hz activity of moderate voltage (25-35 uV) seen predominantly in posterior head regions, symmetric and reactive to  eye opening and eye closing. Hyperventilation and photic stimulation were not performed.   IMPRESSION: This study is within normal limits. No seizures or epileptiform discharges were seen throughout the recording. A normal interictal EEG does not exclude the diagnosis of epilepsy. Priyanka Annabelle Harman   US Abdomen Limited RUQ (LIVER/GB) Result Date: 04/12/2023 CLINICAL DATA:  Alcohol withdrawal. EXAM: ULTRASOUND ABDOMEN LIMITED RIGHT UPPER QUADRANT COMPARISON:  Ultrasound dated 04/24/2019. FINDINGS: Gallbladder: No gallstones or wall thickening visualized. No sonographic Murphy sign noted by sonographer. Common bile duct: Diameter: 2 mm Liver: There is diffuse increased liver echogenicity most commonly seen in the setting of fatty infiltration. Superimposed inflammation or fibrosis is not excluded. Clinical correlation is recommended. Portal vein is patent on color Doppler imaging with normal direction of blood flow towards the liver. Other: None. IMPRESSION: Fatty liver, otherwise unremarkable right upper quadrant ultrasound. Electronically Signed   By: Elgie Collard M.D.   On: 04/12/2023 23:56   CT Angio Chest PE W and/or Wo Contrast Addendum Date: 04/12/2023 ADDENDUM REPORT: 04/12/2023 21:21 ADDENDUM: Critical Value/emergent results were called by telephone at the time of interpretation on 04/12/2023 at 9:13 pm to provider [facility] , who verbally acknowledged these results. Electronically Signed   By: Almira Bar M.D.   On: 04/12/2023 21:21   Result Date: 04/12/2023 CLINICAL DATA:  Shortness of breath, tachycardia, and syncopal episode versus seizure. Pulmonary embolism suspected. EXAM: CT ANGIOGRAPHY CHEST WITH CONTRAST TECHNIQUE: Multidetector CT imaging of the chest was performed using the standard protocol during bolus administration of intravenous contrast. Multiplanar CT image reconstructions and MIPs were obtained to evaluate the vascular anatomy. RADIATION DOSE REDUCTION: This exam  was performed according to the departmental dose-optimization program which includes automated exposure control, adjustment of the mA and/or kV according to patient size and/or use of iterative reconstruction technique. CONTRAST:  75mL OMNIPAQUE IOHEXOL 350 MG/ML SOLN COMPARISON:  CT abdomen and pelvis with IV contrast 10/18/2020. No prior chest CT. Portable chest was obtained earlier today, portable chest comparison 02/17/2022. FINDINGS: Cardiovascular: There is a right lower lobe segmental Khaleed Holan point arterial embolus in the anterior basal segment, extending from the Vedder Brittian point into 2 subsegmental arteries, best seen on 9: 97-104. In the left lower lobe  there is a posterior basal subsegmental embolus on 9: 110-111. No further emboli are seen. The pulmonary arteries are normal caliber. There are no findings suggesting acute right heart strain. There is mild cardiomegaly with left ventricular wall and septal hypertrophy. There are no visible coronary calcifications, no substantial pericardial effusion. The thoracic aorta and great vessels are normal. Pulmonary veins are nondistended. Mediastinum/Nodes: No enlarged mediastinal, hilar, or axillary lymph nodes. Thyroid gland, trachea, and esophagus demonstrate no significant findings. There is a small hiatal hernia. Lungs/Pleura: There is mild posterior atelectasis in the lung bases, slight reticulated scarring in the apices. There is patchy subpleural ground-glass disease in the anterolateral aspect of the right middle lobe, which could be due to pneumonitis, chronic change or a small-vessel infarct although no emboli were identified in the right middle lobe. There is a 7 mm noncalcified nodule in the right lower lobe on 3:88. No further nodules are seen. No pleural effusion. Upper Abdomen: The liver again noted to be moderately steatotic. No acute upper abdominal findings. Musculoskeletal: No significant chest wall abnormality. There is asymmetric subareolar  gynecomastia on the left. No acute or significant osseous findings. Review of the MIP images confirms the above findings. IMPRESSION: 1. Right lower lobe segmental Roger Kettles point arterial embolus in the anterior basal segment, extending from the Rehaan Viloria point into 2 subsegmental arteries. 2. Left lower lobe posterior basal subsegmental embolus. 3. Small overall clot burden with no findings suggesting acute right heart strain. 4. Mild cardiomegaly with left ventricular wall and septal hypertrophy. 5. Patchy subpleural ground-glass disease in the anterolateral aspect of the right middle lobe which could be due to pneumonitis, chronic change or a small-vessel infarct although no emboli were identified in the right middle lobe. 6. 7 mm noncalcified nodule in the right lower lobe. Per Fleischner Society Guidelines, recommend a non-contrast Chest CT at 6-12 months. If patient is high risk for malignancy, consider an additional non-contrast Chest CT at 18-24 months. If patient is low risk for malignancy, non-contrast Chest CT at 18-24 months is optional. These guidelines do not apply to immunocompromised patients and patients with cancer. Follow up in patients with significant comorbidities as clinically warranted. For lung cancer screening, adhere to Lung-RADS guidelines. Reference: Radiology. 2017; 284(1):228-43. 7. Small hiatal hernia. 8. Moderate hepatic steatosis. 9. Asymmetric subareolar gynecomastia on the left. 10. PRA is attempting to notify ordering provider for stat notification at the time of signing. This report will be addended once contact has been made. Electronically Signed: By: Almira Bar M.D. On: 04/12/2023 21:12   CT HEAD WO CONTRAST ( ) Result Date: 04/12/2023 CLINICAL DATA:  Mental status changes, unknown cause, suspected seizure versus syncopal episode with epistaxis. EXAM: CT HEAD WITHOUT CONTRAST TECHNIQUE: Contiguous axial images were obtained from the base of the skull through the vertex  without intravenous contrast. RADIATION DOSE REDUCTION: This exam was performed according to the departmental dose-optimization program which includes automated exposure control, adjustment of the mA and/or kV according to patient size and/or use of iterative reconstruction technique. COMPARISON:  Head CT 02/15/2022. FINDINGS: Brain: There is mild-to-moderate cerebral atrophy, with atrophic ventriculomegaly and moderately advanced small vessel disease of the cerebral white matter. Cerebellum and brainstem are relatively normal in volume and normal in attenuation. No midline shift is seen. There are benign dural calcifications along the falx. No new asymmetry is seen concerning for a cortical based acute infarct, hemorrhage, mass or mass effect. Clear basal cisterns. Vascular: No hyperdense vessel or unexpected calcification. Skull: Negative for  fracture or focal lesions. Sinuses/Orbits: There is membrane disease in the ethmoid and maxillary sinuses. The sphenoid and frontal sinus, mastoid air cells, and middle ears are clear. Midline nasal septum. Negative orbits. Other: None. IMPRESSION: 1. No acute intracranial CT findings or interval changes. 2. Atrophy and small-vessel disease. 3. Ethmoid and maxillary sinus membrane disease without fluid levels or wall erosion. Electronically Signed   By: Almira Bar M.D.   On: 04/12/2023 21:21   DG Chest Portable 1 View Result Date: 04/12/2023 CLINICAL DATA:  Syncopal episode versus seizure with shortness of breath. EXAM: PORTABLE CHEST 1 VIEW COMPARISON:  Portable chest 02/17/2022, contemporaneous CTA chest. FINDINGS: The heart size and mediastinal contours are within normal limits. Both lungs are radiographically clear. Contemporaneous CTA chest demonstrated a ground-glass infiltrate in the right middle lobe which is not radiographically visible. Follow-up CT recommended to ensure clearing. There is no substantial pleural effusion. Multiple overlying monitor wires.  The visualized skeletal structures are unremarkable. IMPRESSION: 1. No radiographic acute chest findings. 2. Contemporaneous CTA demonstrated a ground-glass infiltrate in the right middle lobe which is not radiographically visible. Follow-up CT recommended to ensure clearing. Electronically Signed   By: Almira Bar M.D.   On: 04/12/2023 21:15    Pending Labs Unresulted Labs (From admission, onward)     Start     Ordered   04/14/23 0500  RPR  Tomorrow morning,   R        04/13/23 1005   04/14/23 0500  Vitamin B12  Tomorrow morning,   R        04/13/23 1005   04/14/23 0500  Comprehensive metabolic panel  Tomorrow morning,   R        04/13/23 1005   04/14/23 0500  CBC  Tomorrow morning,   R        04/13/23 1005   04/13/23 1400  CK  Once,   R        04/13/23 1028   04/12/23 2337  Lactic acid, plasma  (Lactic Acid)  STAT Now then every 3 hours,   R (with STAT occurrences)      04/12/23 2336            Vitals/Pain Today's Vitals   04/13/23 1238 04/13/23 1330 04/13/23 1353 04/13/23 1353  BP: 129/78 132/78  132/78  Pulse: (!) 104 100  (!) 110  Resp:  17    Temp:   98.2 F (36.8 C)   TempSrc:   Oral   SpO2:  100%    PainSc:        Isolation Precautions No active isolations  Medications Medications  amLODipine (NORVASC) tablet 10 mg (10 mg Oral Given 04/13/23 1000)  LORazepam (ATIVAN) tablet 1-4 mg (1 mg Oral Given 04/13/23 0817)    Or  LORazepam (ATIVAN) injection 1-4 mg ( Intravenous See Alternative 04/13/23 0817)  folic acid (FOLVITE) tablet 1 mg (1 mg Oral Given 04/13/23 1058)  multivitamin with minerals tablet 1 tablet (1 tablet Oral Given 04/13/23 1000)  LORazepam (ATIVAN) injection 0-4 mg ( Intravenous Not Given 04/13/23 1238)    Followed by  LORazepam (ATIVAN) injection 0-4 mg (has no administration in time range)  levETIRAcetam (KEPPRA) IVPB 1000 mg/100 mL premix (0 mg Intravenous Stopped 04/13/23 0044)  levETIRAcetam (KEPPRA) IVPB 1000 mg/100 mL premix (0 mg  Intravenous Stopped 04/13/23 0051)  thiamine (VITAMIN B1) 500 mg in sodium chloride 0.9 % 50 mL IVPB (0 mg Intravenous Stopped 04/13/23 0900)  apixaban (ELIQUIS) tablet 10  mg (10 mg Oral Given by Other 04/13/23 0813)    Followed by  apixaban (ELIQUIS) tablet 5 mg (has no administration in time range)  0.9 %  sodium chloride infusion ( Intravenous New Bag/Given 04/13/23 0950)  sodium chloride 0.9 % bolus 1,000 mL (0 mLs Intravenous Stopped 04/12/23 2101)  iohexol (OMNIPAQUE) 350 MG/ML injection 75 mL (75 mLs Intravenous Contrast Given 04/12/23 2041)  heparin bolus via infusion 4,000 Units (4,000 Units Intravenous Bolus from Bag 04/12/23 2212)  hydrALAZINE (APRESOLINE) injection 2 mg (2 mg Intravenous Given 04/13/23 0001)  lactulose (CHRONULAC) 10 GM/15ML solution 10 g (10 g Oral Given 04/13/23 0030)  sodium chloride 0.9 % bolus 1,000 mL (0 mLs Intravenous Stopped 04/13/23 0822)  potassium chloride SA (KLOR-CON M) CR tablet 40 mEq (40 mEq Oral Given 04/13/23 1352)    Mobility non-ambulatory     Focused Assessments Neuro Assessment Handoff:  Swallow screen pass? Yes  Cardiac Rhythm: Sinus tachycardia       Neuro Assessment: Exceptions to WDL Neuro Checks:      Has TPA been given? No If patient is a Neuro Trauma and patient is going to OR before floor call report to 4N Charge nurse: [phone] or [phone]   R Recommendations: See Admitting Provider Note  Report given to:   Additional Notes:
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
## 2023-04-13 NOTE — ED Notes (Signed)
Lab requested redraw of CBC due to abnormal results. Phlebotomy notified.

## 2023-04-13 NOTE — ED Notes (Signed)
Per pharmacist, will not DC heparin until Eliquis is given

## 2023-04-13 NOTE — Procedures (Signed)
Patient Name: Dan Henry  MRN: [id number]  Epilepsy Attending: Charlsie Quest  Referring Physician/Provider: Olegario Messier, MD  Date: 04/13/2023 Duration: 22.30 mins  Patient history:  72 y.o.male with PMH of AUD w/ previous delirium tremens/multiple hospitalizations, HTN, HLD, and anxiety who presents to ED 12/11 with concerns of persistent shaking. EEG to evaluate for seizure  Level of alertness: Awake  AEDs during EEG study: LEV, Ativan  Technical aspects: This EEG study was done with scalp electrodes positioned according to the 10-20 International system of electrode placement. Electrical activity was reviewed with band pass filter of 1-70Hz , sensitivity of 7 uV/mm, display speed of 81mm/sec with a 60Hz  notched filter applied as appropriate. EEG data were recorded continuously and digitally stored.  Video monitoring was available and reviewed as appropriate.  Description: The posterior dominant rhythm consists of 8 Hz activity of moderate voltage (25-35 uV) seen predominantly in posterior head regions, symmetric and reactive to eye opening and eye closing. Hyperventilation and photic stimulation were not performed.     IMPRESSION: This study is within normal limits. No seizures or epileptiform discharges were seen throughout the recording.  A normal interictal EEG does not exclude the diagnosis of epilepsy.  Dan Henry

## 2023-04-13 NOTE — ED Notes (Signed)
Pt out of bed and attempting to undress. Pt redirected back to bed but remains confused. Pt removed bilateral IV's.

## 2023-04-13 NOTE — ED Notes (Signed)
Dr. Geraldo Pitter aware of abnormal labs

## 2023-04-14 DIAGNOSIS — F10931 Alcohol use, unspecified with withdrawal delirium: Secondary | ICD-10-CM | POA: Diagnosis not present

## 2023-04-14 DIAGNOSIS — I2699 Other pulmonary embolism without acute cor pulmonale: Secondary | ICD-10-CM | POA: Diagnosis not present

## 2023-04-14 DIAGNOSIS — R569 Unspecified convulsions: Secondary | ICD-10-CM | POA: Diagnosis not present

## 2023-04-14 LAB — COMPREHENSIVE METABOLIC PANEL
ALT: 25 U/L (ref 0–44)
AST: 94 U/L — ABNORMAL HIGH (ref 15–41)
Albumin: 3.6 g/dL (ref 3.5–5.0)
Alkaline Phosphatase: 52 U/L (ref 38–126)
Anion gap: 12 (ref 5–15)
BUN: 10 mg/dL (ref 8–23)
CO2: 18 mmol/L — ABNORMAL LOW (ref 22–32)
Calcium: 8 mg/dL — ABNORMAL LOW (ref 8.9–10.3)
Chloride: 109 mmol/L (ref 98–111)
Creatinine, Ser: 1.26 mg/dL — ABNORMAL HIGH (ref 0.61–1.24)
GFR, Estimated: >60 mL/min (ref >60)
Glucose, Bld: 132 mg/dL — ABNORMAL HIGH (ref 70–99)
Potassium: 3.1 mmol/L — ABNORMAL LOW (ref 3.5–5.1)
Sodium: 139 mmol/L (ref 135–145)
Total Bilirubin: 1 mg/dL (ref <1.2)
Total Protein: 7.3 g/dL (ref 6.5–8.1)

## 2023-04-14 LAB — CBC
HCT: 35.5 % — ABNORMAL LOW (ref 39.0–52.0)
Hemoglobin: 11.8 g/dL — ABNORMAL LOW (ref 13.0–17.0)
MCH: 28.3 pg (ref 26.0–34.0)
MCHC: 33.2 g/dL (ref 30.0–36.0)
MCV: 85.1 fL (ref 80.0–100.0)
Platelets: 190 10*3/uL (ref 150–400)
RBC: 4.17 MIL/uL — ABNORMAL LOW (ref 4.22–5.81)
RDW: 17.7 % — ABNORMAL HIGH (ref 11.5–15.5)
WBC: 11.2 10*3/uL — ABNORMAL HIGH (ref 4.0–10.5)
nRBC: 0 % (ref 0.0–0.2)

## 2023-04-14 LAB — GLUCOSE, CAPILLARY
Glucose-Capillary: 142 mg/dL — ABNORMAL HIGH (ref 70–99)
Glucose-Capillary: 164 mg/dL — ABNORMAL HIGH (ref 70–99)
Glucose-Capillary: 138 mg/dL — ABNORMAL HIGH (ref 70–99)
Glucose-Capillary: 112 mg/dL — ABNORMAL HIGH (ref 70–99)

## 2023-04-14 LAB — RPR: RPR Ser Ql: NONREACTIVE

## 2023-04-14 LAB — VITAMIN B12: Vitamin B-12: 511 pg/mL (ref 180–914)

## 2023-04-14 LAB — CK: Total CK: 3940 U/L — ABNORMAL HIGH (ref 49–397)

## 2023-04-14 MED ORDER — LOPERAMIDE HCL 2 MG PO CAPS
2.0000 mg | ORAL_CAPSULE | ORAL | Status: AC | PRN
Start: 2023-04-14 — End: 2023-04-17

## 2023-04-14 MED ORDER — CHLORDIAZEPOXIDE HCL 25 MG PO CAPS
25.0000 mg | ORAL_CAPSULE | Freq: Four times a day (QID) | ORAL | Status: AC
  Administered 2023-04-14 (×4): 25 mg via ORAL
  Filled 2023-04-14 (×4): qty 1

## 2023-04-14 MED ORDER — ADULT MULTIVITAMIN W/MINERALS CH
1.0000 | ORAL_TABLET | Freq: Every day | ORAL | Status: DC
Start: 2023-04-14 — End: 2023-04-14
  Filled 2023-04-14: qty 1

## 2023-04-14 MED ORDER — ONDANSETRON 4 MG PO TBDP: 4.0000 mg | ORAL_TABLET | Freq: Four times a day (QID) | ORAL | Status: AC | PRN

## 2023-04-14 MED ORDER — TRIMETHOBENZAMIDE HCL 100 MG/ML IM SOLN: 200.0000 mg | Freq: Four times a day (QID) | INTRAMUSCULAR | Status: DC | PRN

## 2023-04-14 MED ORDER — POTASSIUM CHLORIDE CRYS ER 20 MEQ PO TBCR
40.0000 meq | EXTENDED_RELEASE_TABLET | Freq: Two times a day (BID) | ORAL | Status: AC
  Administered 2023-04-14 (×2): 40 meq via ORAL
  Filled 2023-04-14 (×2): qty 2

## 2023-04-14 MED ORDER — THIAMINE HCL 100 MG/ML IJ SOLN
100.0000 mg | Freq: Once | INTRAMUSCULAR | Status: AC
  Administered 2023-04-14: 100 mg via INTRAMUSCULAR
  Filled 2023-04-14: qty 2

## 2023-04-14 MED ORDER — CHLORDIAZEPOXIDE HCL 25 MG PO CAPS: 25.0000 mg | ORAL_CAPSULE | Freq: Four times a day (QID) | ORAL | Status: AC | PRN

## 2023-04-14 MED ORDER — HYDROXYZINE HCL 25 MG PO TABS: 25.0000 mg | ORAL_TABLET | Freq: Four times a day (QID) | ORAL | Status: AC | PRN

## 2023-04-14 MED ORDER — CHLORDIAZEPOXIDE HCL 25 MG PO CAPS
25.0000 mg | ORAL_CAPSULE | Freq: Every day | ORAL | Status: AC
  Administered 2023-04-17: 25 mg via ORAL
  Filled 2023-04-14: qty 1

## 2023-04-14 MED ORDER — CHLORDIAZEPOXIDE HCL 25 MG PO CAPS
25.0000 mg | ORAL_CAPSULE | ORAL | Status: AC
  Administered 2023-04-16 (×2): 25 mg via ORAL
  Filled 2023-04-14 (×2): qty 1

## 2023-04-14 MED ORDER — ACETAMINOPHEN 325 MG PO TABS: 650.0000 mg | ORAL_TABLET | Freq: Four times a day (QID) | ORAL | Status: DC | PRN

## 2023-04-14 MED ORDER — METOPROLOL TARTRATE 25 MG PO TABS
25.0000 mg | ORAL_TABLET | Freq: Two times a day (BID) | ORAL | Status: DC
  Administered 2023-04-14 – 2023-04-19 (×11): 25 mg via ORAL
  Filled 2023-04-14 (×11): qty 1

## 2023-04-14 MED ORDER — CHLORDIAZEPOXIDE HCL 25 MG PO CAPS
25.0000 mg | ORAL_CAPSULE | Freq: Three times a day (TID) | ORAL | Status: AC
  Administered 2023-04-15 (×3): 25 mg via ORAL
  Filled 2023-04-14 (×3): qty 1

## 2023-04-14 NOTE — Plan of Care (Signed)
  Problem: Education: Goal: Knowledge of General Education information will improve Description Including pain rating scale, medication(s)/side effects and non-pharmacologic comfort measures Outcome: Progressing   Problem: Health Behavior/Discharge Planning: Goal: Ability to manage health-related needs will improve Outcome: Progressing   

## 2023-04-14 NOTE — TOC Initial Note (Signed)
Transition of Care [facility]) - Initial/Assessment Note    Patient Details  Name: Dan Henry MRN: [id number] Date of Birth: [birth year]  Transition [facility] Care [facility]) CM/SW Contact:    Baldemar Lenis, LCSW Phone Number: 04/14/2023, 4:08 PM  Clinical Narrative:       CSW met with patient to discuss recommendation for SNF. Patient in agreement, said he had been to CIR before. CSW explained CIR not recommended at this time, but patient said he is agreeable to whatever will get him better. CSW discussed substance abuse counseling, patient said he wants help and wants to get better. Counseling resources placed on AVS. CSW to complete referral for SNF and will follow with bed offers.            Expected Discharge Plan: Skilled Nursing Facility Barriers to Discharge: Continued Medical Work up, English as a second language teacher, Active Substance Use - Placement   Patient Goals and CMS Choice Patient states their goals for this hospitalization and ongoing recovery are:: to get better CMS Medicare.gov Compare Post Acute Care list provided to:: Patient Choice offered to / list presented to : Patient [place] ownership interest in [facility].provided to:: Patient    Expected Discharge Plan and Services     Post Acute Care Choice: Skilled Nursing Facility Living arrangements for the past 2 months: Apartment                                      Prior Living Arrangements/Services Living arrangements for the past 2 months: Apartment Lives with:: Self Patient language and need for interpreter reviewed:: No Do you feel safe going back to the place where you live?: Yes      Need for Family Participation in Patient Care: No (Comment) Care giver support system in place?: No (comment)   Criminal Activity/Legal Involvement Pertinent to Current Situation/Hospitalization: No - Comment as needed  Activities of Daily Living   ADL Screening (condition at time of admission) Independently  performs ADLs?: Yes (appropriate for developmental age) Is the patient deaf or have difficulty hearing?: No Does the patient have difficulty seeing, even when wearing glasses/contacts?: No Does the patient have difficulty concentrating, remembering, or making decisions?: No  Permission Sought/Granted Permission sought to share information with : Facility Industrial/product designer granted to share information with : Yes, Verbal Permission Granted     Permission granted to share info w AGENCY: SNF        Emotional Assessment Appearance:: Appears stated age Attitude/Demeanor/Rapport: Engaged Affect (typically observed): Appropriate Orientation: : Oriented to Self, Oriented to Place, Oriented to Situation Alcohol / Substance Use: Alcohol Use Psych Involvement: No (comment)  Admission diagnosis:  Hepatic encephalopathy (HCC) [K76.82] Seizure (HCC) [R56.9] Increased ammonia level [R79.89] Acute pulmonary embolism without acute cor pulmonale, unspecified pulmonary embolism type (HCC) [I26.99] Patient Active Problem List   Diagnosis Date Noted   Alcohol withdrawal syndrome, with delirium (HCC) 04/14/2023   Acute pulmonary embolism (HCC) 04/13/2023   Seizure (HCC) 04/12/2023   Fever 02/17/2022   Hypertensive emergency 02/15/2022   Hypertensive urgency 02/15/2022   Need for vaccination with 13-polyvalent pneumococcal conjugate vaccine 08/10/2020   Screening for colon cancer 08/10/2020   Cognitive dysfunction 06/10/2020   Iron deficiency anemia 11/22/2019   Stable angina (HCC) 05/08/2019   Anemia 02/26/2019   Urinary retention 10/25/2018   Malnutrition of moderate degree 10/24/2018   Severe alcohol use disorder (HCC) 10/23/2018  Hepatic hemangioma 10/19/2018   Visual hallucinations 06/26/2018   Abnormal TSH 06/26/2018   Need for hepatitis B vaccination 02/21/2018   Unintentional weight loss 02/16/2018   Benign essential tremor 07/19/2017   Elevated LFTs 05/16/2017    ASCVD (arteriosclerotic cardiovascular disease) 05/16/2017   Right carotid bruit 05/08/2017   Essential hypertension 05/21/2015   Healthcare maintenance 05/21/2015   PCP:  Default, Provider, MD Pharmacy:   [facility] [phone] Ginette Otto, [place] - [address] [place] [address] Phone: [phone] Fax: [phone]  [facility] [address] [place] [address] Phone: [phone] Fax: [phone]     Social Drivers of Health (SDOH) Social History: SDOH Screenings   Food Insecurity: Food Insecurity Present (04/13/2023)  Housing: High Risk (04/13/2023)  Transportation Needs: Unmet Transportation Needs (04/13/2023)  Utilities: At Risk (04/13/2023)  Depression (PHQ2-9): Low Risk  (10/22/2020)  Recent Concern: Depression (PHQ2-9) - Medium Risk (09/23/2020)  Tobacco Use: Low Risk  (04/13/2023)   SDOH Interventions:     Readmission Risk Interventions     No data to display

## 2023-04-14 NOTE — Evaluation (Signed)
Occupational Therapy Evaluation Patient Details Name: Dan Henry MRN: [id number] DOB: [birth year] Today's Date: 04/14/2023   History of Present Illness Pt is a 72 y.o. male who presented 04/12/23 with concerns of persistent shaking and hallucinations. Being worked up for seizure in setting of alcohol withdrawal. Pt also found to have acute bil PE. PMH: HTN, alcohol use disorder   Clinical Impression   Pt admitted with seizure like symptoms d/t alcohol withdrawal. Pt currently with functional limitations due to the deficits listed below (see OT Problem List). Prior to admit, pt was living alone and completing all ADL tasks and functional mobility at Mod I level. Patient will benefit from continued inpatient follow up therapy, <3 hours/day.  Pt will benefit from acute skilled OT to increase their safety and independence with ADL and functional mobility for ADL to facilitate discharge. OT will continue to follow patient acutely.         If plan is discharge home, recommend the following: A little help with walking and/or transfers;A little help with bathing/dressing/bathroom;Assistance with cooking/housework    Functional Status Assessment  Patient has had a recent decline in their functional status and demonstrates the ability to make significant improvements in function in a reasonable and predictable amount of time.  Equipment Recommendations  Tub/shower seat       Precautions / Restrictions Precautions Precautions: Fall Restrictions Weight Bearing Restrictions Per Provider Order: No      Mobility Bed Mobility Overal bed mobility: Needs Assistance Bed Mobility: Supine to Sit     Supine to sit: Supervision, HOB elevated, Used rails     General bed mobility comments: VC to initiate task. No physical assist needed to complete    Transfers Overall transfer level: Needs assistance Equipment used: Rolling walker (2 wheels) Transfers: Sit to/from Stand, Bed to  chair/wheelchair/BSC Sit to Stand: Contact guard assist     Step pivot transfers: Contact guard assist     General transfer comment: Pt initiatelly stood up at bedside without AD and demonstrated posterior bias causing him to return to sit quickly. RW provided for sit to stand transition and functional transfer. Pt provided with VC for hand placement during RW management prior to sit to stand transition.      Balance Overall balance assessment: Needs assistance Sitting-balance support: Feet supported Sitting balance-Leahy Scale: Fair Sitting balance - Comments: sitting EOB Postural control: Posterior lean Standing balance support: Bilateral upper extremity supported, During functional activity, Reliant on assistive device for balance Standing balance-Leahy Scale: Poor Standing balance comment: Posterior bias present without use of RW. ONce provided RW, posterior bias was corrected.          ADL either performed or assessed with clinical judgement   ADL     General ADL Comments: At this time, pt requires Set-up/supervision for BADL tasks from a seated position.     Vision Baseline Vision/History: 0 No visual deficits Ability to See in Adequate Light: 0 Adequate Patient Visual Report: No change from baseline Vision Assessment?: No apparent visual deficits     Perception Perception: Not tested       Praxis Praxis: Not tested       Pertinent Vitals/Pain Pain Assessment Pain Assessment: No/denies pain     Extremity/Trunk Assessment Upper Extremity Assessment Upper Extremity Assessment: Overall WFL for tasks assessed;Right hand dominant   Lower Extremity Assessment Lower Extremity Assessment: Defer to PT evaluation   Cervical / Trunk Assessment Cervical / Trunk Assessment: Normal   Communication Communication Communication: No apparent difficulties  Cueing Techniques: Verbal cues;Tactile cues   Cognition Arousal: Alert Behavior During Therapy: WFL for tasks  assessed/performed Overall Cognitive Status: Impaired/Different from baseline Area of Impairment: Problem solving, Safety/judgement, Attention, Following commands    Following Commands: Follows one step commands consistently, Follows one step commands with increased time Safety/Judgement: Decreased awareness of safety, Decreased awareness of deficits   Problem Solving: Slow processing, Decreased initiation, Requires verbal cues General Comments: Slightly impulsive as pt stood up before OT cued him to.     General Comments  VSS On RA            Home Living Family/patient expects to be discharged to:: Private residence Living Arrangements: Alone Available Help at Discharge: Family;Available PRN/intermittently Type of Home: Apartment Home Access: Level entry     Home Layout: One level     Bathroom Shower/Tub: Chief Strategy Officer: Standard     Home Equipment: Cane - single point          Prior Functioning/Environment Prior Level of Function : Independent/Modified Independent      Mobility Comments: Pt reports that he used a SPC at baseline. ADLs Comments: uses taxi services        OT Problem List: Decreased strength;Decreased safety awareness;Decreased activity tolerance;Decreased coordination;Decreased cognition;Impaired balance (sitting and/or standing)      OT Treatment/Interventions: Self-care/ADL training;Therapeutic activities;Therapeutic exercise;Cognitive remediation/compensation;Patient/family education;Balance training    OT Goals(Current goals can be found in the care plan section) Acute Rehab OT Goals Patient Stated Goal: none stated OT Goal Formulation: Patient unable to participate in goal setting Time For Goal Achievement: 04/28/23 Potential to Achieve Goals: Good  OT Frequency: Min 1X/week       AM-PAC OT "6 Clicks" Daily Activity     Outcome Measure Help from another person eating meals?: None Help from another person taking  care of personal grooming?: A Little Help from another person toileting, which includes using toliet, bedpan, or urinal?: A Little Help from another person bathing (including washing, rinsing, drying)?: A Little Help from another person to put on and taking off regular upper body clothing?: A Little Help from another person to put on and taking off regular lower body clothing?: A Little 6 Click Score: 19   End of Session Equipment Utilized During Treatment: Gait belt;Rolling walker (2 wheels)  Activity Tolerance: Patient tolerated treatment well Patient left: in chair;with call bell/phone within reach;with chair alarm set  OT Visit Diagnosis: Unsteadiness on feet (R26.81);Muscle weakness (generalized) (M62.81)                Time: 4098-1191 OT Time Calculation (min): 17 min Charges:  OT General Charges $OT Visit: 1 Visit OT Evaluation $OT Eval Low Complexity: 1 Low  Dan Henry, OTR/L,CBIS  Supplemental OT - [facility] and [facility] Secure Chat Preferred    Dan Henry, Charisse March 04/14/2023, 11:04 AM

## 2023-04-14 NOTE — Progress Notes (Signed)
Physical Therapy Treatment Patient Details Name: Dan Henry MRN: [id number] DOB: 1950-06-22 Today's Date: 04/14/2023   History of Present Illness Pt is a 72 y.o. male who presented 04/12/23 with concerns of persistent shaking and hallucinations. Being worked up for seizure in setting of alcohol withdrawal. Pt also found to have acute bil PE. PMH: HTN, alcohol use disorder    PT Comments  Pt greeted resting in bed and agreeable to session with good progress towards acute goals. Pt A&O x4 however demonstrating some impaired cognition with decreased short term memory, discussed with pt ambulating over weekend with staff near beginning of session with pt needing cues for recall at end of session. Physically pt requiring grossly supervision for bed mobility and CGA for transfers and gait with RW for support. Pt without LOB appreciated during transfers and gait. Pt continues to benefit from skilled PT services to progress toward functional mobility goals.     If plan is discharge home, recommend the following: A lot of help with walking and/or transfers;A lot of help with bathing/dressing/bathroom;Assistance with cooking/housework;Direct supervision/assist for medications management;Direct supervision/assist for financial management;Assist for transportation;Supervision due to cognitive status   Can travel by private vehicle     Yes  Equipment Recommendations  Rolling walker (2 wheels)    Recommendations for Other Services       Precautions / Restrictions Precautions Precautions: Fall Restrictions Weight Bearing Restrictions Per Provider Order: No     Mobility  Bed Mobility Overal bed mobility: Needs Assistance Bed Mobility: Supine to Sit     Supine to sit: Supervision, HOB elevated, Used rails Sit to supine: HOB elevated, Supervision   General bed mobility comments: VC to initiate task. No physical assist needed to complete    Transfers Overall transfer level: Needs  assistance Equipment used: Rolling walker (2 wheels) Transfers: Sit to/from Stand, Bed to chair/wheelchair/BSC Sit to Stand: Contact guard assist           General transfer comment: CGA for safety, no posterior lean noted    Ambulation/Gait Ambulation/Gait assistance: Contact guard assist Gait Distance (Feet): 150 Feet Assistive device: Rolling walker (2 wheels) Gait Pattern/deviations: Decreased step length - right, Decreased step length - left, Decreased stride length, Leaning posteriorly, Shuffle, Knee flexed in stance - right, Knee flexed in stance - left Gait velocity: reduced     General Gait Details: slow steady gait with RW support, some decreased attention to R environment bumping sink and door frame   Stairs             Wheelchair Mobility     Tilt Bed    Modified Rankin (Stroke Patients Only)       Balance Overall balance assessment: Needs assistance Sitting-balance support: Feet supported Sitting balance-Leahy Scale: Fair Sitting balance - Comments: sitting EOB   Standing balance support: Bilateral upper extremity supported, During functional activity, Reliant on assistive device for balance Standing balance-Leahy Scale: Poor Standing balance comment: reliant on UE supprot                            Cognition Arousal: Alert Behavior During Therapy: WFL for tasks assessed/performed Overall Cognitive Status: Impaired/Different from baseline Area of Impairment: Problem solving, Safety/judgement, Attention, Following commands                   Current Attention Level: Selective   Following Commands: Follows one step commands consistently, Follows one step commands with increased time Safety/Judgement: Decreased  awareness of safety, Decreased awareness of deficits   Problem Solving: Slow processing, Decreased initiation, Requires verbal cues          Exercises      General Comments General comments (skin integrity,  edema, etc.): VSS on RA, HR 85-98bpm during activity      Pertinent Vitals/Pain Pain Assessment Faces Pain Scale: No hurt    Home Living                          Prior Function            PT Goals (current goals can now be found in the care plan section) Acute Rehab PT Goals Patient Stated Goal: to improve PT Goal Formulation: With patient Time For Goal Achievement: 04/27/23 Progress towards PT goals: Progressing toward goals    Frequency    Min 1X/week      PT Plan      Co-evaluation              AM-PAC PT "6 Clicks" Mobility   Outcome Measure  Help needed turning from your back to your side while in a flat bed without using bedrails?: A Little Help needed moving from lying on your back to sitting on the side of a flat bed without using bedrails?: A Little Help needed moving to and from a bed to a chair (including a wheelchair)?: A Little Help needed standing up from a chair using your arms (e.g., wheelchair or bedside chair)?: A Little Help needed to walk in hospital room?: A Little Help needed climbing 3-5 steps with a railing? : Total 6 Click Score: 16    End of Session Equipment Utilized During Treatment: Gait belt Activity Tolerance: Patient tolerated treatment well Patient left: in bed;with call bell/phone within reach Nurse Communication: Mobility status PT Visit Diagnosis: Unsteadiness on feet (R26.81);Other abnormalities of gait and mobility (R26.89);Muscle weakness (generalized) (M62.81);Difficulty in walking, not elsewhere classified (R26.2)     Time: 9562-1308 PT Time Calculation (min) (ACUTE ONLY): 15 min  Charges:    $Gait Training: 8-22 mins PT General Charges $$ ACUTE PT VISIT: 1 Visit                     Geneviene Tesch R. PTA Acute Rehabilitation Services Office: [phone]   Catalina Antigua 04/14/2023, 3:58 PM

## 2023-04-14 NOTE — Progress Notes (Signed)
Summary: Dan Henry is a 72 yo male with PMH of AUD w/ previous delirium tremens/multiple hospitalizations, HTN, HLD, and anxiety who presented with concerns of persistent shaking and was admitted to the Internal Medicine Teaching Service on 12/11 for seizures due to alcohol withdrawal.    Subjective:  Day #2. No overnight events reported.   Patient is much more alert this morning. He demonstrated good insight of his alcohol consumption and the importance of medication support to help him quit in a safe manner. Also expressed interest in AA. Endorsed drinking about 3, 16 oz beers daily, with last drink on morning of admission. Also endorsed slight abdominal discomfort but denied nausea, and a slight frontal headache that began this morning. No other complaints. Patient ok with starting medication taper for alcohol detoxification today as well as updating his niece.    Objective:  Vital signs in last 24 hours: Vitals:   04/14/23 0346 04/14/23 0400 04/14/23 0530 04/14/23 0539  BP:   (!) 180/93 (!) 155/94  Pulse:   (!) 106 (!) 109  Resp:  17    Temp:  99.2 F (37.3 C)    TempSrc:  Oral    SpO2:  98%    Height: 5\' 4"  (1.626 m)         Latest Ref Rng & Units 04/14/2023    6:36 AM 04/13/2023    8:15 AM 04/13/2023    6:09 AM  CBC  WBC 4.0 - 10.5 K/uL 11.2  11.2  13.3   Hemoglobin 13.0 - 17.0 g/dL 08.6  57.8  46.9   Hematocrit 39.0 - 52.0 % 35.5  34.8  36.0   Platelets 150 - 400 K/uL 190  198  223        Latest Ref Rng & Units 04/13/2023    6:09 AM 04/12/2023    7:16 PM 04/12/2023    7:09 PM  CMP  Glucose 70 - 99 mg/dL 629  528  413   BUN 8 - 23 mg/dL 14  16  14    Creatinine 0.61 - 1.24 mg/dL 2.44  0.10  2.72   Sodium 135 - 145 mmol/L 137  142  140   Potassium 3.5 - 5.1 mmol/L 2.7  3.9  4.0   Chloride 98 - 111 mmol/L 106  110  102   CO2 22 - 32 mmol/L 16   8   Calcium 8.9 - 10.3 mg/dL 8.1   9.4   Total Protein 6.5 - 8.1 g/dL 8.5   9.2   Total Bilirubin <1.2 mg/dL 1.2    0.5   Alkaline Phos 38 - 126 U/L 66   79   AST 15 - 41 U/L 81   66   ALT 0 - 44 U/L 23   24     CK 3,940 RPR non-reactive   Physical Exam Constitutional: Patient is alert, engaged, conversing appropriately with team, and cooperative with today's exam. He is resting in bed in no acute distress. Team members are able to understand all responses.  CV: RRR, no r/m/g.   Pulmonary/Respiratory: Clear lungs bilaterally, normal respiratory effort on room air.  Neuro: Oriented to self, place, month, year, and situation. No trembling or asterixis. No acute deficits noted on exam.  MSK: Moving all limbs spontaneously.  Abdominal: Soft, non-tender, non-distended.  Skin: Warm and dry.   TTE IMPRESSIONS  1. Left ventricular ejection fraction, by estimation, is 65 to 70%. The  left ventricle has normal function. The left ventricle has  no regional  wall motion abnormalities. There is moderate concentric left ventricular  hypertrophy. Left ventricular  diastolic parameters are indeterminate.   2. Right ventricular systolic function is normal. The right ventricular  size is normal. There is normal pulmonary artery systolic pressure.   3. The mitral valve is normal in structure. Trivial mitral valve  regurgitation. No evidence of mitral stenosis.   4. The aortic valve is tricuspid. Aortic valve regurgitation is not  visualized. No aortic stenosis is present.   5. The inferior vena cava is normal in size with greater than 50%  respiratory variability, suggesting right atrial pressure of 3 mmHg.   6. Cannot exclude a small PFO.   Comparison(s): No prior Echocardiogram.   Assessment/Plan:  Principal Problem:   Acute pulmonary embolism (HCC) Active Problems:   Seizure (HCC)  Seizure 2/2 Alcohol Withdrawal Given IV Keppra 2 g on admission to ED, per neurology, no need for continuing antiepileptic or outpatient neurology follow up at this time unless patient has a repeat episode. Last alcohol use  reported to be on day of admission. Highest CIWA since admission of 22, CIWA of 3 this morning. Patient denied N/V or visual hallucinations this morning, no asterixis on exam. Will continue folic acid, MVA and thiamine at higher dose. AM CK improved, will not order additional IV fluids. Patient interested in alcohol detoxification and stopping his alcohol use outside of the hospital, will start librium taper today. Per patient, no more alcohol left at home.  Plan:  - START librium taper, day 1 today  - Librium taper order set: Hydroxyzine 25 mg q6H PRN, loperamide PRN, ondansetron q6H PRN, IM tigan PRN - CIWA w/ Ativan  - Folic acid 1 mg daily  - MVA daily  - IV Thiamine 500 mg   Acute Bilateral Pulmonary Embolism TTE unremarkable. No evidence of DVT in lower extremities on bilateral LE doppler US per preliminary report. Transitioned to DOAC 12/12. Will continue.  Plan: - Eliquis 10 mg BID for 7 days (12/12-12/18), then Eliquis 5 mg BID   - Monitor telemetry/vitals   Hypokalemia Magnesium 2.4 WNL. Potassium on CMP 3.1 this morning. Repleted with PO potassium 40 mEq. Will monitor on daily labs.   Hypertensive Urgency Patient initially presented with SBP > 180, as high as 210 since admission. Given IV hydralazine 2 g x 1 dose. History of HTN, home regimen includes hydrochlorothiazide 12.5 mg daily, irbesartan 300 mg daily and metoprolol 25 mg BID, not on meds for past month. Will resume meds here as appropriate.  - Continue home dose of amlodipine 10 mg daily  - Consider resuming other antihypertensives if Cr stable  - On telemetry, monitor vitals  Hyperammonemia Ammonia of 295 on admission. Patient denies confusion, initially alert and oriented x 4. Only oriented to safe and place after receiving Ativan. Admission physical exam showing diffuse tremulousness, which improved with Ativan administration. No concerns for asterixis. CTA and follow-up US showed hepatic steatosis. [place] could not rule  out superimposed inflammation/fibrosis. FIB-4 = 3.70 (advanced fibrosis likely). Albumin WNL and PT/INR = 15.7, 1.2. Given 1x PO lactulose 10 g. Will continue to monitor.   AKI- resolved Elevated CK, concern for Rhabdomyolysis- improving Baseline Cr ~1.0 (0.97 7 months ago, 1.02 about a year ago). Admission Cr 1.82, Cr stable at 1.26. CK improved this morning, no IV fluids today. Will continue to monitor.   - Daily CMP/BMP  AGMA- resolved Anion gap of 30, Bicarb of 8, and lactic acid of 9.0 on  admission in the setting of seizures due to alcohol withdrawal. UDS negative. Repeat lactic acid 1.0 s/p 1L NS x 2 in ED. Will monitor.  - Daily CMP/BMP     Hyperglycemia- resolved CBG in ED 308. UA with glucosuria. Hgb A1c 5.9% in prediabetes range. Will monitor CBGs for now.    Elevated Troponin- peaked ECG on admission showing ST depression in inferior leads. Patient denied chest pain. Troponin I 31 > 207 > 209 > 149 peaked. Will monitor, can repeat ECG if patient with acute chest pain.    RLL Nodule Incidental finding on CTA chest: 7 mm noncalcified nodule in the right lower lobe. Will need outpatient follow up. If patient is high risk for malignancy, Consider non-contrast Chest CT at 18-24 months if patient is at high risk for malignancy, if low risk Chest CT is optional.   HLD Chronic. Holding home atorvastatin 20 mg daily due to CK/concern for rhabdomyolysis. Will resume when appropriate.    Diet: Regular  VTE: DOAC Code: Full   Prior to Admission Living Arrangement: Home  Anticipated Discharge Location: SNF per PT Barriers to Discharge: Pending medical management  Dispo: Anticipated discharge in approximately 1-2 day(s).   Philomena Doheny, MD, PGY-1 04/14/2023, 7:52 AM Pager: [phone] After 5pm on weekdays and 1pm on weekends: On Call pager [phone]

## 2023-04-14 NOTE — [place] FL2 (Signed)
[place] MEDICAID FL2 LEVEL OF CARE FORM     IDENTIFICATION  Patient Name: Dan Henry Birthdate: 1950/11/21 Sex: male Admission Date (Current Location): 04/12/2023  [facility] and [place] Number:  Producer, television/film/video and Address:  [facility], [address] [place], [place] [address]      Provider Number: [id number]  Attending Physician Name and Address:  Mercie Eon, MD  Relative Name and Phone Number:       Current Level of Care: Hospital Recommended Level of Care: Skilled Nursing Facility Prior Approval Number:    Date Approved/Denied:   PASRR Number: [id number]  Discharge Plan: SNF    Current Diagnoses: Patient Active Problem List   Diagnosis Date Noted   Alcohol withdrawal syndrome, with delirium (HCC) 04/14/2023   Acute pulmonary embolism (HCC) 04/13/2023   Seizure (HCC) 04/12/2023   Fever 02/17/2022   Hypertensive emergency 02/15/2022   Hypertensive urgency 02/15/2022   Need for vaccination with 13-polyvalent pneumococcal conjugate vaccine 08/10/2020   Screening for colon cancer 08/10/2020   Cognitive dysfunction 06/10/2020   Iron deficiency anemia 11/22/2019   Stable angina (HCC) 05/08/2019   Anemia 02/26/2019   Urinary retention 10/25/2018   Malnutrition of moderate degree 10/24/2018   Severe alcohol use disorder (HCC) 10/23/2018   Hepatic hemangioma 10/19/2018   Visual hallucinations 06/26/2018   Abnormal TSH 06/26/2018   Need for hepatitis B vaccination 02/21/2018   Unintentional weight loss 02/16/2018   Benign essential tremor 07/19/2017   Elevated LFTs 05/16/2017   ASCVD (arteriosclerotic cardiovascular disease) 05/16/2017   Right carotid bruit 05/08/2017   Essential hypertension 05/21/2015   Healthcare maintenance 05/21/2015    Orientation RESPIRATION BLADDER Height & Weight     Self, Situation, Place  Normal Incontinent Weight:   Height:  5\' 4"  (162.6 cm)  BEHAVIORAL SYMPTOMS/MOOD NEUROLOGICAL BOWEL  NUTRITION STATUS      Continent Diet (regular)  AMBULATORY STATUS COMMUNICATION OF NEEDS Skin   Limited Assist Verbally Normal                       Personal Care Assistance Level of Assistance  Bathing, Feeding, Dressing Bathing Assistance: Limited assistance Feeding assistance: Independent Dressing Assistance: Limited assistance     Functional Limitations Info             SPECIAL CARE FACTORS FREQUENCY  PT (By licensed PT), OT (By licensed OT)     PT Frequency: 5x/wk OT Frequency: 5x/wk            Contractures Contractures Info: Not present    Additional Factors Info  Code Status, Allergies Code Status Info: Full Allergies Info: NKA           Current Medications (04/14/2023):  This is the current hospital active medication list Current Facility-Administered Medications  Medication Dose Route Frequency Provider Last Rate Last Admin   acetaminophen (TYLENOL) tablet 650 mg  650 mg Oral Q6H PRN Colbert Coyer, Priscila, MD       amLODipine (NORVASC) tablet 10 mg  10 mg Oral Daily Nooruddin, Saad, MD   10 mg at 04/14/23 0915   apixaban (ELIQUIS) tablet 10 mg  10 mg Oral BID Rocky Morel, DO   10 mg at 04/14/23 8469   Followed by   Melene Muller ON 04/20/2023] apixaban (ELIQUIS) tablet 5 mg  5 mg Oral BID Rocky Morel, DO       chlordiazePOXIDE (LIBRIUM) capsule 25 mg  25 mg Oral Q6H PRN Monna Fam, MD  chlordiazePOXIDE (LIBRIUM) capsule 25 mg  25 mg Oral QID Monna Fam, MD   25 mg at 04/14/23 1350   Followed by   Melene Muller ON 04/15/2023] chlordiazePOXIDE (LIBRIUM) capsule 25 mg  25 mg Oral TID Monna Fam, MD       Followed by   Melene Muller ON 04/16/2023] chlordiazePOXIDE (LIBRIUM) capsule 25 mg  25 mg Oral Alvino Chapel, MD       Followed by   Melene Muller ON 04/17/2023] chlordiazePOXIDE (LIBRIUM) capsule 25 mg  25 mg Oral Daily Monna Fam, MD       folic acid (FOLVITE) tablet 1 mg  1 mg Oral Daily Nooruddin, Saad, MD   1 mg at 04/14/23 9811    hydrOXYzine (ATARAX) tablet 25 mg  25 mg Oral Q6H PRN Monna Fam, MD       loperamide (IMODIUM) capsule 2-4 mg  2-4 mg Oral PRN Monna Fam, MD       metoprolol tartrate (LOPRESSOR) tablet 25 mg  25 mg Oral BID Monna Fam, MD   25 mg at 04/14/23 1022   multivitamin with minerals tablet 1 tablet  1 tablet Oral Daily Nooruddin, Saad, MD   1 tablet at 04/14/23 0911   ondansetron (ZOFRAN-ODT) disintegrating tablet 4 mg  4 mg Oral Q6H PRN Monna Fam, MD       potassium chloride SA (KLOR-CON M) CR tablet 40 mEq  40 mEq Oral BID Colbert Coyer, Priscila, MD   40 mEq at 04/14/23 0911   thiamine (VITAMIN B1) 500 mg in sodium chloride 0.9 % 50 mL IVPB  500 mg Intravenous Q24H Rocky Morel, DO   Stopped at 04/14/23 1031   trimethobenzamide (TIGAN) injection 200 mg  200 mg Intramuscular Q6H PRN Monna Fam, MD         Discharge Medications: Please see discharge summary for a list of discharge medications.  Relevant Imaging Results:  Relevant Lab Results:   Additional Information SS#: [id number]  Baldemar Lenis, LCSW

## 2023-04-15 LAB — BASIC METABOLIC PANEL
Anion gap: 11 (ref 5–15)
BUN: 13 mg/dL (ref 8–23)
CO2: 18 mmol/L — ABNORMAL LOW (ref 22–32)
Calcium: 8.5 mg/dL — ABNORMAL LOW (ref 8.9–10.3)
Chloride: 107 mmol/L (ref 98–111)
Creatinine, Ser: 1.3 mg/dL — ABNORMAL HIGH (ref 0.61–1.24)
GFR, Estimated: 58 mL/min — ABNORMAL LOW (ref >60)
Glucose, Bld: 130 mg/dL — ABNORMAL HIGH (ref 70–99)
Potassium: 3.3 mmol/L — ABNORMAL LOW (ref 3.5–5.1)
Sodium: 136 mmol/L (ref 135–145)

## 2023-04-15 LAB — CBC
HCT: 34.1 % — ABNORMAL LOW (ref 39.0–52.0)
Hemoglobin: 11.1 g/dL — ABNORMAL LOW (ref 13.0–17.0)
MCH: 28.2 pg (ref 26.0–34.0)
MCHC: 32.6 g/dL (ref 30.0–36.0)
MCV: 86.5 fL (ref 80.0–100.0)
Platelets: 176 10*3/uL (ref 150–400)
RBC: 3.94 MIL/uL — ABNORMAL LOW (ref 4.22–5.81)
RDW: 17.4 % — ABNORMAL HIGH (ref 11.5–15.5)
WBC: 8.6 10*3/uL (ref 4.0–10.5)
nRBC: 0 % (ref 0.0–0.2)

## 2023-04-15 LAB — CK: Total CK: 1836 U/L — ABNORMAL HIGH (ref 49–397)

## 2023-04-15 LAB — GLUCOSE, CAPILLARY: Glucose-Capillary: 131 mg/dL — ABNORMAL HIGH (ref 70–99)

## 2023-04-15 MED ORDER — POTASSIUM CHLORIDE CRYS ER 20 MEQ PO TBCR
40.0000 meq | EXTENDED_RELEASE_TABLET | Freq: Two times a day (BID) | ORAL | Status: AC
  Administered 2023-04-15 (×2): 40 meq via ORAL
  Filled 2023-04-15 (×2): qty 2

## 2023-04-15 MED ORDER — ATORVASTATIN CALCIUM 10 MG PO TABS
20.0000 mg | ORAL_TABLET | Freq: Every day | ORAL | Status: DC
  Administered 2023-04-15 – 2023-04-19 (×5): 20 mg via ORAL
  Filled 2023-04-15 (×5): qty 2

## 2023-04-15 NOTE — Progress Notes (Addendum)
Summary: Dan Henry is a 72 yo male with PMH of AUD w/ previous delirium tremens/multiple hospitalizations, HTN, HLD, and anxiety who presented with concerns of persistent shaking and was admitted to the Internal Medicine Teaching Service on 12/11 for seizures due to alcohol withdrawal.    Subjective:  Day #3. No overnight events reported.   Patient continues to be alert today, denies headache, denies any other complaint. Patient again expressed interest in SNF.   Patient medically stable for discharge, waiting for SNF placement at this time. Will update patient's niece.   Objective:  Vital signs in last 24 hours: Vitals:   04/14/23 2000 04/15/23 0000 04/15/23 0400 04/15/23 0831  BP: 130/78 135/83 133/79 (!) 149/89  Pulse: 82 91 86 80  Resp: 18 18 18 18   Temp: (!) 97.5 F (36.4 C) 97.7 F (36.5 C) 98.2 F (36.8 C) 98.9 F (37.2 C)  TempSrc: Axillary Axillary Axillary Oral  SpO2: 99% 100% 100% 99%  Height:          Latest Ref Rng & Units 04/15/2023    6:04 AM 04/14/2023    6:36 AM 04/13/2023    8:15 AM  CBC  WBC 4.0 - 10.5 K/uL 8.6  11.2  11.2   Hemoglobin 13.0 - 17.0 g/dL 51.7  61.6  07.3   Hematocrit 39.0 - 52.0 % 34.1  35.5  34.8   Platelets 150 - 400 K/uL 176  190  198        Latest Ref Rng & Units 04/15/2023    6:04 AM 04/14/2023    6:36 AM 04/13/2023    6:09 AM  CMP  Glucose 70 - 99 mg/dL 710  626  948   BUN 8 - 23 mg/dL 13  10  14    Creatinine 0.61 - 1.24 mg/dL 5.46  2.70  3.50   Sodium 135 - 145 mmol/L 136  139  137   Potassium 3.5 - 5.1 mmol/L 3.3  3.1  2.7   Chloride 98 - 111 mmol/L 107  109  106   CO2 22 - 32 mmol/L 18  18  16    Calcium 8.9 - 10.3 mg/dL 8.5  8.0  8.1   Total Protein 6.5 - 8.1 g/dL  7.3  8.5   Total Bilirubin <1.2 mg/dL  1.0  1.2   Alkaline Phos 38 - 126 U/L  52  66   AST 15 - 41 U/L  94  81   ALT 0 - 44 U/L  25  23     Physical Exam Constitutional: Patient is alert and cooperative, in no acute distress.  CV: RRR, no r/m/g.    Pulmonary/Respiratory: Clear lungs bilaterally, normal respiratory effort on room air.  Neuro: Alert and oriented to self, place, situation, month, and year. No acute deficits noted.  MSK: Moving all limbs spontaneously.  Abdominal: Soft, non-tender, non-distended.  Skin: Warm and dry.   Assessment/Plan:  Principal Problem:   Acute pulmonary embolism (HCC) Active Problems:   Seizure (HCC)   Alcohol withdrawal syndrome, with delirium (HCC)  Seizure 2/2 Alcohol Withdrawal Given IV Keppra 2 g on admission to ED, per neurology, no need for continuing antiepileptic or outpatient neurology follow up at this time unless patient has a repeat episode. Last alcohol use reported to be on day of admission. Highest CIWA since admission of 22, CIWA of 1 this morning.   Patient denied any acute complaints today. Will continue folic acid, MVA and thiamine at higher dose. Patient medically  stable, waiting for SNF placement.  Plan:  - Continue librium taper, day 2 today  - Librium taper order set: Hydroxyzine 25 mg q6H PRN, loperamide PRN, ondansetron q6H PRN, IM tigan PRN - CIWA - Folic acid 1 mg daily  - MVA daily  - IV Thiamine 500 mg   Acute Bilateral Pulmonary Embolism TTE unremarkable. No evidence of DVT in lower extremities on bilateral LE doppler US per preliminary report. Transitioned to DOAC 12/12. Will continue.  Plan: - Eliquis 10 mg BID for 7 days (12/12-12/18), then Eliquis 5 mg BID   - Monitor telemetry/vitals   Hypokalemia Magnesium 2.4 WNL. Potassium on CMP 3.13 this morning. Repleted with PO potassium 40 mEq. Will monitor on daily labs.   HTN Hypertensive Urgency- resolved Patient initially presented with SBP > 180, as high as 210 since admission. Given IV hydralazine 2 g x 1 dose. History of HTN, home regimen includes hydrochlorothiazide 12.5 mg daily, irbesartan 300 mg daily and metoprolol 25 mg BID, not on meds for past month. Will resume meds here as appropriate.  -  Continue home dose of amlodipine 10 mg daily and metoprolol 25 mg BID - Consider resuming other antihypertensives as appropriate - On telemetry, monitor vitals  Hyperammonemia- stable Ammonia of 295 on admission. Patient denies confusion, initially alert and oriented x 4. Only oriented to safe and place after receiving Ativan. Admission physical exam showing diffuse tremulousness, which improved with Ativan administration. No concerns for asterixis. CTA and follow-up US showed hepatic steatosis. [place] could not rule out superimposed inflammation/fibrosis. FIB-4 = 3.70 (advanced fibrosis likely). Albumin WNL and PT/INR = 15.7, 1.2. Given 1x PO lactulose 10 g. Will continue to monitor.   AKI- improving Elevated CK, concern for Rhabdomyolysis- improved Baseline Cr ~1.0 (0.97 7 months ago, 1.02 about a year ago). Admission Cr 1.82, Cr stable at 1.30. Will continue to monitor.   - Daily CMP/BMP  AGMA- resolved Anion gap of 30, Bicarb of 8, and lactic acid of 9.0 on admission in the setting of seizures due to alcohol withdrawal. UDS negative. Repeat lactic acid 1.0 s/p 1L NS x 2 in ED. Will monitor.  - Daily CMP/BMP     Hyperglycemia- resolved CBG in ED 308. UA with glucosuria. Hgb A1c 5.9% in prediabetes range. Will monitor CBGs for now.    Elevated Troponin- peaked ECG on admission showing ST depression in inferior leads. Patient denied chest pain. Troponin I 31 > 207 > 209 > 149 peaked. Will monitor, can repeat ECG if patient with acute chest pain.    RLL Nodule Incidental finding on CTA chest: 7 mm noncalcified nodule in the right lower lobe. Will need outpatient follow up. If patient is high risk for malignancy, Consider non-contrast Chest CT at 18-24 months if patient is at high risk for malignancy, if low risk Chest CT is optional.   HLD Chronic. Holding home atorvastatin 20 mg daily due to CK/concern for rhabdomyolysis. Will resume when appropriate.    Diet: Regular  VTE: DOAC Code:  Full   Prior to Admission Living Arrangement: Home  Anticipated Discharge Location: SNF  Barriers to Discharge: SNF placement Dispo: Anticipated discharge in approximately 1-2 day(s).   Philomena Doheny, MD, PGY-1 04/15/2023, 11:20 AM Pager: [phone] After 5pm on weekdays and 1pm on weekends: On Call pager [phone]

## 2023-04-15 NOTE — Discharge Instructions (Addendum)
To [name] or their caretakers,  They were admitted to [facility] on 04/12/2023 for evaluation and treatment of:  Active Problems:   Alcohol use disorder, severe, in early remission (HCC)   Esophageal dysphagia  Principal Problem (Resolved):   Acute pulmonary embolism (HCC) Resolved Problems:   Seizure (HCC)   Alcohol withdrawal syndrome, with delirium (HCC)   AKI (acute kidney injury) (HCC)   Sepsis (HCC)   Hypoxic respiratory failure (HCC)   Aspiration pneumonia (HCC)  The evaluation suggested seizures related to alcohol use. They were treated antiseizure medicine and medicine for safe alcohol withdrawal.  They also had pneumonia while hospitalized which got better with antibiotics.  They were discharged from the hospital on 04/22/23. I recommend the following after leaving the hospital:   Continue naltrexone for alcohol cravings.  Continue taking the vitamins thiamine and folate.  You do not need to take antiseizure medicines as long as you are not at risk for alcohol withdrawal.  You need to continue taking the blood thinner, apixaban, for blood clots in the lungs.  Continue taking irbesartan for high blood pressure.  Talk with your primary doctor about restarting your other blood pressure medicines.  You had pneumonia while you were hospitalized.  You will finish a course of antibiotics outside of the hospital.  You will take 3 more doses of amoxicillin/clavulanate.  Your swallowing is mildly impaired.  Chew food carefully and follow pills with pured foods like applesauce or mashed potatoes.  You have a small right lung nodule.  This is not causing symptoms.  Most lung nodules are not dangerous.  Talk with your doctor in a year about a repeat CAT scan of your chest to look at this lung nodule again.  For questions about your care plan, until you are able to see your primary doctor: Call [phone]. Dial 0 for the operator. Ask for the  internal medicine resident on call.  Marrianne Mood MD 04/22/2023, 11:19 AM    Information on my medicine - ELIQUIS (apixaban)  Why was Eliquis prescribed for you? Eliquis was prescribed to treat blood clots that may have been found in the veins of your legs (deep vein thrombosis) or in your lungs (pulmonary embolism) and to reduce the risk of them occurring again.  What do You need to know about Eliquis ? The starting dose is 10 mg (two 5 mg tablets) taken TWICE daily for the FIRST SEVEN (7) DAYS, then on  12/19  the dose is reduced to ONE 5 mg tablet taken TWICE daily.  Eliquis may be taken with or without food.   Try to take the dose about the same time in the morning and in the evening. If you have difficulty swallowing the tablet whole please discuss with your pharmacist how to take the medication safely.  Take Eliquis exactly as prescribed and DO NOT stop taking Eliquis without talking to the doctor who prescribed the medication.  Stopping may increase your risk of developing a new blood clot.  Refill your prescription before you run out.  After discharge, you should have regular check-up appointments with your healthcare provider that is prescribing your Eliquis.    What do you do if you miss a dose? If a dose of ELIQUIS is not taken at the scheduled time, take it as soon as possible on the same day and twice-daily administration should be resumed. The dose should not be doubled to make up for a missed dose.  Important  Safety Information A possible side effect of Eliquis is bleeding. You should call your healthcare provider right away if you experience any of the following: Bleeding from an injury or your nose that does not stop. Unusual colored urine (red or dark brown) or unusual colored stools (red or black). Unusual bruising for unknown reasons. A serious fall or if you hit your head (even if there is no bleeding).  Some medicines may interact with Eliquis and  might increase your risk of bleeding or clotting while on Eliquis. To help avoid this, consult your healthcare provider or pharmacist prior to using any new prescription or non-prescription medications, including herbals, vitamins, non-steroidal anti-inflammatory drugs (NSAIDs) and supplements.  This website has more information on Eliquis (apixaban): http://www.eliquis.com/eliquis/home

## 2023-04-15 NOTE — TOC Progression Note (Signed)
Transition of Care [facility]) - Progression Note    Patient Details  Name: Dan Henry MRN: [id number] Date of Birth: 06/04/1950  [facility] CM/SW Contact  Mearl Latin, LCSW Phone Number: 04/15/2023, 8:32 AM  Clinical Narrative:    Awaiting SNF bed offers.    Expected Discharge Plan: Skilled Nursing Facility Barriers to Discharge: Continued Medical Work up, English as a second language teacher, Active Substance Use - Placement  Expected Discharge Plan and Services     Post Acute Care Choice: Skilled Nursing Facility Living arrangements for the past 2 months: Apartment                                       Social Determinants of Health (SDOH) Interventions SDOH Screenings   Food Insecurity: Food Insecurity Present (04/13/2023)  Housing: High Risk (04/13/2023)  Transportation Needs: Unmet Transportation Needs (04/13/2023)  Utilities: At Risk (04/13/2023)  Depression (PHQ2-9): Low Risk  (10/22/2020)  Recent Concern: Depression (PHQ2-9) - Medium Risk (09/23/2020)  Tobacco Use: Low Risk  (04/13/2023)    Readmission Risk Interventions     No data to display

## 2023-04-15 NOTE — Plan of Care (Signed)

## 2023-04-16 LAB — BASIC METABOLIC PANEL
Anion gap: 8 (ref 5–15)
BUN: 13 mg/dL (ref 8–23)
CO2: 19 mmol/L — ABNORMAL LOW (ref 22–32)
Calcium: 9.3 mg/dL (ref 8.9–10.3)
Chloride: 110 mmol/L (ref 98–111)
Creatinine, Ser: 1.21 mg/dL (ref 0.61–1.24)
GFR, Estimated: >60 mL/min (ref >60)
Glucose, Bld: 96 mg/dL (ref 70–99)
Potassium: 3.8 mmol/L (ref 3.5–5.1)
Sodium: 137 mmol/L (ref 135–145)

## 2023-04-16 MED ORDER — IRBESARTAN 300 MG PO TABS
300.0000 mg | ORAL_TABLET | Freq: Every day | ORAL | Status: DC
  Administered 2023-04-16 – 2023-04-19 (×4): 300 mg via ORAL
  Filled 2023-04-16 (×4): qty 1

## 2023-04-16 MED ORDER — GUAIFENESIN 100 MG/5ML PO LIQD
5.0000 mL | ORAL | Status: DC | PRN
  Administered 2023-04-18: 5 mL via ORAL
  Filled 2023-04-16: qty 5

## 2023-04-16 NOTE — Progress Notes (Addendum)
Summary: Dan Henry is a 72 yo male with PMH of AUD w/ previous delirium tremens/multiple hospitalizations, HTN, HLD, and anxiety who presented with concerns of persistent shaking and was admitted to the Internal Medicine Teaching Service on 12/11 for seizures due to alcohol withdrawal.    Subjective:  Day #4. No overnight events reported.   Patient appears comfortable, denies any new acute complaints. Eating, drinking, and voiding without issue. Continues to deny any auditory hallucinations or visual hallucinations. Last BM yesterday, normal.   Patient continues to be medically stable for discharge, waiting for SNF placement at this time. Will update patient's niece, Dan Henry, as needed.   Objective:  Vital signs in last 24 hours: Vitals:   04/15/23 2010 04/16/23 0038 04/16/23 0356 04/16/23 0753  BP: (!) 163/90 (!) 161/98 (!) 151/78 (!) 146/84  Pulse: 87 82 80 69  Resp: 19 18 17 16   Temp: 98 F (36.7 C) 98.6 F (37 C) 98.9 F (37.2 C) 98.6 F (37 C)  TempSrc: Oral Oral Oral Oral  SpO2: 100% 97% 100% 100%  Height:          Latest Ref Rng & Units 04/15/2023    6:04 AM 04/14/2023    6:36 AM 04/13/2023    8:15 AM  CBC  WBC 4.0 - 10.5 K/uL 8.6  11.2  11.2   Hemoglobin 13.0 - 17.0 g/dL 24.4  01.0  27.2   Hematocrit 39.0 - 52.0 % 34.1  35.5  34.8   Platelets 150 - 400 K/uL 176  190  198        Latest Ref Rng & Units 04/16/2023    7:13 AM 04/15/2023    6:04 AM 04/14/2023    6:36 AM  CMP  Glucose 70 - 99 mg/dL 96  536  644   BUN 8 - 23 mg/dL 13  13  10    Creatinine 0.61 - 1.24 mg/dL 0.34  7.42  5.95   Sodium 135 - 145 mmol/L 137  136  139   Potassium 3.5 - 5.1 mmol/L 3.8  3.3  3.1   Chloride 98 - 111 mmol/L 110  107  109   CO2 22 - 32 mmol/L 19  18  18    Calcium 8.9 - 10.3 mg/dL 9.3  8.5  8.0   Total Protein 6.5 - 8.1 g/dL   7.3   Total Bilirubin <1.2 mg/dL   1.0   Alkaline Phos 38 - 126 U/L   52   AST 15 - 41 U/L   94   ALT 0 - 44 U/L   25     Physical  Exam Constitutional: Patient is alert and engaged, in no acute distress. Appears to be comfortably resting in bed.  CV: RRR, no r/m/g   Pulmonary/Respiratory: Normal respiratory effort on room air.  Neuro: Alert and oriented, no focal deficits observed.  MSK: Moving all limbs spontaneously.   Abdominal: Soft, non-tender, non-distended.   Skin: Warm and dry.    Assessment/Plan:  Principal Problem:   Acute pulmonary embolism (HCC) Active Problems:   Seizure (HCC)   Alcohol withdrawal syndrome, with delirium (HCC)  Seizure 2/2 Alcohol Withdrawal Given IV Keppra 2 g on admission to ED, per neurology, no need for continuing antiepileptic or outpatient neurology follow up at this time unless patient has a repeat episode. Last alcohol use reported to be on day of admission. Highest CIWA since admission of 22, CIWA of 0 this morning.   Patient continues to be medically stable  for discharge, awaiting SNF placement.   Plan:  - Continue librium taper, day 3 today  - Librium taper order set: Hydroxyzine 25 mg q6H PRN, loperamide PRN, ondansetron q6H PRN, IM tigan PRN - CIWA - Folic acid 1 mg daily  - MVA daily  - IV Thiamine 500 mg   Acute Bilateral Pulmonary Embolism TTE 12/12 unremarkable. No evidence of DVT in lower extremities on bilateral LE doppler US per preliminary report. Transitioned to DOAC 12/12. Will continue.  Plan: - Eliquis 10 mg BID for 7 days (12/12-12/18), then Eliquis 5 mg BID   - Monitor vitals   Hypokalemia Magnesium 2.4 WNL. Potassium on BMP 3.8 this morning. Will monitor on daily labs and replete as needed.    HTN Hypertensive Urgency- resolved Patient initially presented with SBP > 180, as high as 210 since admission. Given IV hydralazine 2 g x 1 dose. History of HTN, home regimen includes hydrochlorothiazide 12.5 mg daily, irbesartan 300 mg daily and metoprolol 25 mg BID, not on meds for past month. Blood pressures continue to be elevated to 140s-160s systolic,  will resume home irbesartan. Can consider adding other antihypertensives as appropriate.  - Continue home dose of amlodipine 10 mg daily and metoprolol 25 mg BID - START irbesartan 300 mg daily  - On telemetry, monitor vitals  Hyperammonemia- stable Ammonia of 295 on admission. Patient denies confusion, initially alert and oriented x 4. Only oriented to safe and place after receiving Ativan. Admission physical exam showing diffuse tremulousness, which improved with Ativan administration. No concerns for asterixis. CTA and follow-up US showed hepatic steatosis. [place] could not rule out superimposed inflammation/fibrosis. FIB-4 = 3.70 (advanced fibrosis likely). Albumin WNL and PT/INR = 15.7, 1.2. Given 1x PO lactulose 10 g. Will continue to monitor.   AKI- improving Elevated CK, concern for Rhabdomyolysis- improved Baseline Cr ~1.0 (0.97 7 months ago, 1.02 about a year ago). Admission Cr 1.82, Cr stable at 1.21. Will continue to monitor.   - Daily BMP  AGMA- resolved Anion gap of 30, Bicarb of 8, and lactic acid of 9.0 on admission in the setting of seizures due to alcohol withdrawal. UDS negative. Repeat lactic acid 1.0 s/p 1L NS x 2 in ED. Will monitor.  - Daily BMP     Hyperglycemia- resolved CBG in ED 308. UA with glucosuria. Hgb A1c 5.9% in prediabetes range. CBGs here 70s-130s. Will monitor CBGs for now.    Elevated Troponin- peaked ECG on admission showing ST depression in inferior leads. Patient denied chest pain. Troponin I 31 > 207 > 209 > 149 peaked. Will monitor, can repeat ECG if patient with acute chest pain.    RLL Nodule Incidental finding on CTA chest: 7 mm noncalcified nodule in the right lower lobe. Will need outpatient follow up. If patient is high risk for malignancy, Consider non-contrast Chest CT at 18-24 months if patient is at high risk for malignancy, if low risk Chest CT is optional.   HLD Chronic. Initially held home atorvastatin 20 mg daily due to CK/concern for  rhabdomyolysis. Resumed 12/14.    Diet: Regular  VTE: DOAC Code: Full   Prior to Admission Living Arrangement: Home  Anticipated Discharge Location: SNF  Barriers to Discharge: SNF placement Dispo: Anticipated discharge in approximately 1-2 day(s).   Philomena Doheny, MD, PGY-1 04/16/2023, 11:36 AM Pager: [phone] After 5pm on weekdays and 1pm on weekends: On Call pager [phone]

## 2023-04-16 NOTE — Plan of Care (Signed)

## 2023-04-17 DIAGNOSIS — F1021 Alcohol dependence, in remission: Secondary | ICD-10-CM | POA: Diagnosis not present

## 2023-04-17 DIAGNOSIS — I2699 Other pulmonary embolism without acute cor pulmonale: Secondary | ICD-10-CM | POA: Diagnosis not present

## 2023-04-17 LAB — BASIC METABOLIC PANEL
Anion gap: 9 (ref 5–15)
BUN: 17 mg/dL (ref 8–23)
CO2: 21 mmol/L — ABNORMAL LOW (ref 22–32)
Calcium: 9.4 mg/dL (ref 8.9–10.3)
Chloride: 107 mmol/L (ref 98–111)
Creatinine, Ser: 1.05 mg/dL (ref 0.61–1.24)
GFR, Estimated: >60 mL/min (ref >60)
Glucose, Bld: 97 mg/dL (ref 70–99)
Potassium: 4.1 mmol/L (ref 3.5–5.1)
Sodium: 137 mmol/L (ref 135–145)

## 2023-04-17 LAB — HEPATIC FUNCTION PANEL
ALT: 23 U/L (ref 0–44)
AST: 35 U/L (ref 15–41)
Albumin: 3.3 g/dL — ABNORMAL LOW (ref 3.5–5.0)
Alkaline Phosphatase: 49 U/L (ref 38–126)
Bilirubin, Direct: 0.2 mg/dL (ref 0.0–0.2)
Indirect Bilirubin: 0.6 mg/dL (ref 0.3–0.9)
Total Bilirubin: 0.8 mg/dL (ref <1.2)
Total Protein: 7.3 g/dL (ref 6.5–8.1)

## 2023-04-17 LAB — MAGNESIUM: Magnesium: 1.5 mg/dL — ABNORMAL LOW (ref 1.7–2.4)

## 2023-04-17 LAB — AMMONIA: Ammonia: 29 umol/L (ref 9–35)

## 2023-04-17 MED ORDER — NALTREXONE HCL 50 MG PO TABS
50.0000 mg | ORAL_TABLET | Freq: Every day | ORAL | Status: DC
  Administered 2023-04-17 – 2023-04-19 (×3): 50 mg via ORAL
  Filled 2023-04-17 (×3): qty 1

## 2023-04-17 MED ORDER — MAGNESIUM SULFATE 2 GM/50ML IV SOLN
2.0000 g | Freq: Once | INTRAVENOUS | Status: AC
  Administered 2023-04-17: 2 g via INTRAVENOUS
  Filled 2023-04-17: qty 50

## 2023-04-17 NOTE — Progress Notes (Addendum)
HD#5 SUBJECTIVE:  Patient Summary: Dan Henry is a 72 yo male with PMH of AUD w/ previous delirium tremens/multiple hospitalizations, HTN, HLD, and anxiety who presented with concerns of persistent shaking and was admitted to the Internal Medicine Teaching Service on 12/11 for seizures due to alcohol withdrawal.   Overnight Events: N/A  Interim History: Doing well this morning. No new concerns. Eating breakfast. Had a BM yesterday.   OBJECTIVE:  Vital Signs: Vitals:   04/16/23 1958 04/17/23 0008 04/17/23 0427 04/17/23 0751  BP: 136/82 138/82 114/67 120/80  Pulse: 86 76 76 76  Resp: 16 16 18 18   Temp: 98.4 F (36.9 C) 98.6 F (37 C) 98.6 F (37 C) 98.2 F (36.8 C)  TempSrc: Oral Oral Oral Oral  SpO2: 99% 100% 100% 100%  Height:        Intake/Output Summary (Last 24 hours) at 04/17/2023 1034 Last data filed at 04/16/2023 1840 Gross per 24 hour  Intake 480 ml  Output --  Net 480 ml   Net IO Since Admission: 1,487.28 mL [04/17/23 1034]  Physical Exam: Physical Exam: General: well appearing, sitting in bed eating breakfast Cardiac:regular rate and rhythm, no BL LE pitting edema   Pulmonary:clear to auscultate bilaterally, normal effort on room air  Abdominal:soft, non-tender Neuro:alert and awake, moving all four extremities spontaneously  MSK:no pitting edema Skin:warm and dry Psych: normal mood and affect        Latest Ref Rng & Units 04/15/2023    6:04 AM 04/14/2023    6:36 AM 04/13/2023    8:15 AM  CBC  WBC 4.0 - 10.5 K/uL 8.6  11.2  11.2   Hemoglobin 13.0 - 17.0 g/dL 96.2  95.2  84.1   Hematocrit 39.0 - 52.0 % 34.1  35.5  34.8   Platelets 150 - 400 K/uL 176  190  198         Latest Ref Rng & Units 04/17/2023    8:04 AM 04/16/2023    7:13 AM 04/15/2023    6:04 AM  CMP  Glucose 70 - 99 mg/dL 97  96  324   BUN 8 - 23 mg/dL 17  13  13    Creatinine 0.61 - 1.24 mg/dL 4.01  0.27  2.53   Sodium 135 - 145 mmol/L 137  137  136   Potassium 3.5 - 5.1  mmol/L 4.1  3.8  3.3   Chloride 98 - 111 mmol/L 107  110  107   CO2 22 - 32 mmol/L 21  19  18    Calcium 8.9 - 10.3 mg/dL 9.4  9.3  8.5   Total Protein 6.5 - 8.1 g/dL 7.3     Total Bilirubin <1.2 mg/dL 0.8     Alkaline Phos 38 - 126 U/L 49     AST 15 - 41 U/L 35     ALT 0 - 44 U/L 23       Ammonia 04/17/2023: 29  ASSESSMENT/PLAN:  Assessment: Principal Problem:   Acute pulmonary embolism (HCC) Active Problems:   Seizure (HCC)   Alcohol withdrawal syndrome, with delirium (HCC)   Plan: #Seizure 2/2 Alcohol Withdrawal Given IV Keppra 2 g on admission to ED, per neurology, no need for continuing antiepileptic or outpatient neurology follow up at this time unless patient has a repeat episode. Last alcohol use reported to be on day of admission.   Patient continues to be medically stable for discharge, awaiting SNF placement.  Plan:  - Continue librium taper, day  4 (last day on 12/16) - Librium taper order set: Hydroxyzine 25 mg q6H PRN, loperamide PRN, ondansetron q6H PRN, IM tigan PRN - CIWA score 0  - Folic acid 1 mg daily  - MVA daily  - IV Thiamine 500 mg  -Begin Naltrexone today, 50mg  daily   Acute Bilateral Pulmonary Embolism TTE 12/12 unremarkable. No evidence of DVT in lower extremities on bilateral LE doppler US per preliminary report. Transitioned to DOAC 12/12.  Plan: - Eliquis 10 mg BID for 7 days (12/12-12/18), then Eliquis 5 mg BID   - Monitor vitals   Hypokalemia: resolved K+ 4.1 -Will monitor on daily labs and replete as needed.  -Recheck Magnesium    HTN Hypertensive Urgency- resolved Patient initially presented with SBP > 180, as high as 210 since admission. Given IV hydralazine 2 g x 1 dose. History of HTN, home regimen includes hydrochlorothiazide 12.5 mg daily, irbesartan 300 mg daily and metoprolol 25 mg BID, not on meds for past month.  Can consider adding other antihypertensives as appropriate.  - Continue home dose of amlodipine 10 mg daily and  metoprolol 25 mg BID - Irbesartan 300 mg daily  - On telemetry, monitor vitals - BP today is 120/80, will hold off on restarting home hydrochlorothiazide today    Hepatic Steatosis on CT Hyperammonemia- resolved Ammonia of 295 on admission. [place] could not rule out superimposed inflammation/fibrosis. FIB-4 = 3.70 (advanced fibrosis likely). Albumin WNL and PT/INR = 15.7, 1.2. S/p 1x PO lactulose 10 g.  -Will continue to monitor.  -ammonia improved to 29 and no sx of hepatic encephalopathy, no indication for lactulose  -Elevated AST has improved as well (94-->35)   AKI- Resolved Elevated CK, concern for Rhabdomyolysis- improved Baseline Cr ~1.0 (0.97 7 months ago, 1.02 about a year ago). Admission Cr 1.82, Cr stable at 1.05. Will continue to monitor.   - Daily BMP   AGMA- resolved    Hyperglycemia- resolved -Will monitor CBGs for now.    Elevated Troponin- peaked ECG on admission showing ST depression in inferior leads. Patient denied chest pain. Troponin I 31 > 207 > 209 > 149 peaked. Will monitor, can repeat ECG if patient with acute chest pain. -Echocardiogram shows LVEF 65-70% without LV regional wall motion abnormalities   RLL Nodule Incidental finding on CTA chest: 7 mm noncalcified nodule in the right lower lobe. Will need outpatient follow up. If patient is high risk for malignancy, Consider non-contrast Chest CT at 18-24 months, if low risk Chest CT is optional.    HLD Chronic. Initially held home atorvastatin 20 mg daily due to CK/concern for rhabdomyolysis. Resumed 12/14.   Best Practice: Diet: Regular diet VTE: NOAC Code: Full  Signature: Faith Rogue, D.O.  Internal Medicine Resident, PGY-1 [facility]  Pager: [phone] 10:34 AM, 04/17/2023   Please contact the on call pager after 5 pm and on weekends at [phone].

## 2023-04-17 NOTE — TOC Progression Note (Signed)
Transition of Care [facility]) - Progression Note    Patient Details  Name: Dan Henry MRN: [id number] Date of Birth: 1950/05/05  Transition of [facility]) CM/SW Contact  Baldemar Lenis, [place] Phone Number: 04/17/2023, 1:00 PM  Clinical Narrative:   CSW met with patient to provide bed offers for SNF and also discussed with niece. Patient chose [facility], niece in agreement. CSW confirmed with [facility] that they have a bed available for patient. CSW requested CMA to initiate insurance authorization request. CSW to follow.    Expected Discharge Plan: Skilled Nursing Facility Barriers to Discharge: Continued Medical Work up, English as a second language teacher, Active Substance Use - Placement  Expected Discharge Plan and Services     Post Acute Care Choice: Skilled Nursing Facility Living arrangements for the past 2 months: Apartment                                       Social Determinants of Health (SDOH) Interventions SDOH Screenings   Food Insecurity: Food Insecurity Present (04/13/2023)  Housing: High Risk (04/13/2023)  Transportation Needs: Unmet Transportation Needs (04/13/2023)  Utilities: At Risk (04/13/2023)  Depression (PHQ2-9): Low Risk  (10/22/2020)  Recent Concern: Depression (PHQ2-9) - Medium Risk (09/23/2020)  Tobacco Use: Low Risk  (04/13/2023)    Readmission Risk Interventions     No data to display

## 2023-04-17 NOTE — Progress Notes (Signed)
Physical Therapy Treatment Patient Details Name: Dan Henry MRN: [id number] DOB: [birth year] Today's Date: 04/17/2023   History of Present Illness Pt is a 72 y.o. male who presented 04/12/23 with concerns of persistent shaking and hallucinations. Being worked up for seizure in setting of alcohol withdrawal. Pt also found to have acute bil PE. PMH: HTN, alcohol use disorder    PT Comments  Pt resting in bed on arrival and agreeable to session with continued progress towards mobility goals, however continues to be limited by impaired cognition. Pt A&Ox4 however needing cues for sequencing and initiating mobility, max cues for navigation, and demonstrating difficulty with multi-step command following. Physically pt requiring CGA for bed mobility and transfers and min A for gait with rollator support. Pt pleasant and cooperative throughout session and current plan remains appropriate to address deficits and maximize functional independence and decrease caregiver burden. Pt continues to benefit from skilled PT services to progress toward functional mobility goals.     If plan is discharge home, recommend the following: A lot of help with walking and/or transfers;A lot of help with bathing/dressing/bathroom;Assistance with cooking/housework;Direct supervision/assist for medications management;Direct supervision/assist for financial management;Assist for transportation;Supervision due to cognitive status   Can travel by private vehicle     Yes  Equipment Recommendations  Rolling walker (2 wheels)    Recommendations for Other Services       Precautions / Restrictions Precautions Precautions: Fall Restrictions Weight Bearing Restrictions Per Provider Order: No     Mobility  Bed Mobility Overal bed mobility: Needs Assistance Bed Mobility: Supine to Sit     Supine to sit: Supervision, HOB elevated, Used rails     General bed mobility comments: VC to initiate task. No physical assist  needed to complete    Transfers Overall transfer level: Needs assistance Equipment used: Rollator (4 wheels) Transfers: Sit to/from Stand, Bed to chair/wheelchair/BSC Sit to Stand: Contact guard assist           General transfer comment: CGA for safety, max cues for rollator safety    Ambulation/Gait Ambulation/Gait assistance: Min assist Gait Distance (Feet): 175 Feet Assistive device: Rollator (4 wheels) Gait Pattern/deviations: Decreased step length - right, Decreased step length - left, Decreased stride length, Leaning posteriorly, Shuffle, Knee flexed in stance - right, Knee flexed in stance - left Gait velocity: reduced     General Gait Details: min A for steadying assist as pt with some postural sawy, max cues for navigation as pt unable to find room,   Stairs             Wheelchair Mobility     Tilt Bed    Modified Rankin (Stroke Patients Only)       Balance Overall balance assessment: Needs assistance Sitting-balance support: Feet supported Sitting balance-Leahy Scale: Poor Sitting balance - Comments: sitting EOB   Standing balance support: Bilateral upper extremity supported, During functional activity, Reliant on assistive device for balance Standing balance-Leahy Scale: Poor Standing balance comment: reliant on UE supprot                            Cognition Arousal: Alert Behavior During Therapy: WFL for tasks assessed/performed Overall Cognitive Status: Impaired/Different from baseline Area of Impairment: Problem solving, Safety/judgement, Attention, Following commands                   Current Attention Level: Selective   Following Commands: Follows one step commands consistently, Follows one step  commands with increased time Safety/Judgement: Decreased awareness of safety, Decreased awareness of deficits   Problem Solving: Slow processing, Decreased initiation, Requires verbal cues General Comments: increased time  for processing, sequencing and initiating mobility        Exercises      General Comments General comments (skin integrity, edema, etc.): VSS on RA      Pertinent Vitals/Pain Pain Assessment Pain Assessment: No/denies pain Pain Intervention(s): Monitored during session    Home Living                          Prior Function            PT Goals (current goals can now be found in the care plan section) Acute Rehab PT Goals Patient Stated Goal: to improve PT Goal Formulation: With patient Time For Goal Achievement: 04/27/23 Progress towards PT goals: Progressing toward goals    Frequency    Min 1X/week      PT Plan      Co-evaluation              AM-PAC PT "6 Clicks" Mobility   Outcome Measure  Help needed turning from your back to your side while in a flat bed without using bedrails?: A Little Help needed moving from lying on your back to sitting on the side of a flat bed without using bedrails?: A Little Help needed moving to and from a bed to a chair (including a wheelchair)?: A Little Help needed standing up from a chair using your arms (e.g., wheelchair or bedside chair)?: A Little Help needed to walk in hospital room?: A Little Help needed climbing 3-5 steps with a railing? : Total 6 Click Score: 16    End of Session   Activity Tolerance: Patient tolerated treatment well Patient left: with call bell/phone within reach;in chair;with chair alarm set Nurse Communication: Mobility status PT Visit Diagnosis: Unsteadiness on feet (R26.81);Other abnormalities of gait and mobility (R26.89);Muscle weakness (generalized) (M62.81);Difficulty in walking, not elsewhere classified (R26.2)     Time: 1610-9604 PT Time Calculation (min) (ACUTE ONLY): 20 min  Charges:    $Gait Training: 8-22 mins PT General Charges $$ ACUTE PT VISIT: 1 Visit                     Stanford Strauch R. PTA Acute Rehabilitation Services Office: [phone]   Catalina Antigua 04/17/2023, 12:20 PM

## 2023-04-17 NOTE — Plan of Care (Signed)

## 2023-04-17 NOTE — Plan of Care (Signed)
  Problem: Health Behavior/Discharge Planning: Goal: Ability to manage health-related needs will improve Outcome: Progressing   Problem: Activity: Goal: Risk for activity intolerance will decrease Outcome: Progressing   Problem: Nutrition: Goal: Adequate nutrition will be maintained Outcome: Progressing   Problem: Coping: Goal: Level of anxiety will decrease Outcome: Progressing   Problem: Pain Management: Goal: General experience of comfort will improve Outcome: Progressing   Problem: Safety: Goal: Ability to remain free from injury will improve Outcome: Progressing   Problem: Skin Integrity: Goal: Risk for impaired skin integrity will decrease Outcome: Progressing

## 2023-04-18 DIAGNOSIS — F1021 Alcohol dependence, in remission: Secondary | ICD-10-CM | POA: Diagnosis not present

## 2023-04-18 DIAGNOSIS — N179 Acute kidney failure, unspecified: Secondary | ICD-10-CM

## 2023-04-18 DIAGNOSIS — I2699 Other pulmonary embolism without acute cor pulmonale: Secondary | ICD-10-CM | POA: Diagnosis not present

## 2023-04-18 LAB — BASIC METABOLIC PANEL
Anion gap: 6 (ref 5–15)
BUN: 21 mg/dL (ref 8–23)
CO2: 22 mmol/L (ref 22–32)
Calcium: 9.1 mg/dL (ref 8.9–10.3)
Chloride: 108 mmol/L (ref 98–111)
Creatinine, Ser: 1.26 mg/dL — ABNORMAL HIGH (ref 0.61–1.24)
GFR, Estimated: >60 mL/min (ref >60)
Glucose, Bld: 107 mg/dL — ABNORMAL HIGH (ref 70–99)
Potassium: 4.1 mmol/L (ref 3.5–5.1)
Sodium: 136 mmol/L (ref 135–145)

## 2023-04-18 LAB — CBC
HCT: 35.4 % — ABNORMAL LOW (ref 39.0–52.0)
Hemoglobin: 11.6 g/dL — ABNORMAL LOW (ref 13.0–17.0)
MCH: 28.7 pg (ref 26.0–34.0)
MCHC: 32.8 g/dL (ref 30.0–36.0)
MCV: 87.6 fL (ref 80.0–100.0)
Platelets: 230 10*3/uL (ref 150–400)
RBC: 4.04 MIL/uL — ABNORMAL LOW (ref 4.22–5.81)
RDW: 17.6 % — ABNORMAL HIGH (ref 11.5–15.5)
WBC: 7 10*3/uL (ref 4.0–10.5)
nRBC: 0 % (ref 0.0–0.2)

## 2023-04-18 LAB — AMMONIA: Ammonia: <10 umol/L (ref 9–35)

## 2023-04-18 LAB — MAGNESIUM: Magnesium: 1.9 mg/dL (ref 1.7–2.4)

## 2023-04-18 MED ORDER — BENZONATATE 100 MG PO CAPS
200.0000 mg | ORAL_CAPSULE | Freq: Three times a day (TID) | ORAL | Status: DC | PRN
  Administered 2023-04-18 – 2023-04-19 (×2): 200 mg via ORAL
  Filled 2023-04-18 (×2): qty 2

## 2023-04-18 MED ORDER — SENNOSIDES-DOCUSATE SODIUM 8.6-50 MG PO TABS
1.0000 | ORAL_TABLET | Freq: Every day | ORAL | Status: DC
  Administered 2023-04-18: 1 via ORAL
  Filled 2023-04-18: qty 1

## 2023-04-18 NOTE — Care Management Important Message (Signed)
Important Message  Patient Details  Name: Dan Henry MRN: [id number] Date of Birth: Sep 10, 1950   Important Message Given:  Yes - Medicare IM     Dorena Bodo 04/18/2023, 11:00 AM

## 2023-04-18 NOTE — Progress Notes (Signed)
OT Cancellation Note  Patient Details Name: Dan Henry MRN: [id number] DOB: 01-01-1951   Cancelled Treatment:    Reason Eval/Treat Not Completed: Patient at procedure or test/ unavailable Attempted to see pt for OT treatment although recently received lunch tray and was eating. Will re-attempt OT treatment at a later time if able.   Limmie Patricia, OTR/L,CBIS  Supplemental OT - [facility] and [facility] Secure Chat Preferred   04/18/2023, 3:56 PM

## 2023-04-18 NOTE — Progress Notes (Signed)
HD#6 SUBJECTIVE:  Patient Summary: Dan Henry is a 72 yo male with PMH of AUD w/ previous delirium tremens/multiple hospitalizations, HTN, HLD, and anxiety who presented with concerns of persistent shaking and was admitted to the Internal Medicine Teaching Service on 12/11 for seizures due to alcohol withdrawal.   Overnight Events: N/A  Interim History:  Patient denies acute complaints, he reports feeling well. Last BM was 2 days ago.   OBJECTIVE:  Vital Signs: Vitals:   04/17/23 1140 04/17/23 1608 04/17/23 2316 04/18/23 0739  BP: 108/72 104/67 118/70 135/75  Pulse: 72 73 82 71  Resp: 18 17  18   Temp: 97.6 F (36.4 C) (!) 97.5 F (36.4 C) 98.1 F (36.7 C) 97.7 F (36.5 C)  TempSrc: Oral Oral  Oral  SpO2: 100% 100% 99% 99%  Weight:      Height:        Intake/Output Summary (Last 24 hours) at 04/18/2023 1105 Last data filed at 04/18/2023 0700 Gross per 24 hour  Intake 240 ml  Output --  Net 240 ml   Net IO Since Admission: 2,027.28 mL [04/18/23 1105]  Physical Exam: Physical Exam: General:well appearing, sitting in bed  Cardiac:regular rate and rhythm Pulmonary:clear to auscultate bilaterally, normal effort on room air  Abdominal:soft, non tender  Neuro:alert, awake, sitting in bed  MSK:no BL LE pitting edema  Skin:warm and dry  Psych: normal mood and affect      Latest Ref Rng & Units 04/18/2023    5:21 AM 04/15/2023    6:04 AM 04/14/2023    6:36 AM  CBC  WBC 4.0 - 10.5 K/uL 7.0  8.6  11.2   Hemoglobin 13.0 - 17.0 g/dL 16.1  09.6  04.5   Hematocrit 39.0 - 52.0 % 35.4  34.1  35.5   Platelets 150 - 400 K/uL 230  176  190         Latest Ref Rng & Units 04/18/2023    5:21 AM 04/17/2023    8:04 AM 04/16/2023    7:13 AM  CMP  Glucose 70 - 99 mg/dL 409  97  96   BUN 8 - 23 mg/dL 21  17  13    Creatinine 0.61 - 1.24 mg/dL 8.11  9.14  7.82   Sodium 135 - 145 mmol/L 136  137  137   Potassium 3.5 - 5.1 mmol/L 4.1  4.1  3.8   Chloride 98 - 111 mmol/L 108   107  110   CO2 22 - 32 mmol/L 22  21  19    Calcium 8.9 - 10.3 mg/dL 9.1  9.4  9.3   Total Protein 6.5 - 8.1 g/dL  7.3    Total Bilirubin <1.2 mg/dL  0.8    Alkaline Phos 38 - 126 U/L  49    AST 15 - 41 U/L  35    ALT 0 - 44 U/L  23      Ammonia 04/17/2023: <10  ASSESSMENT/PLAN:  Assessment: Principal Problem:   Acute pulmonary embolism (HCC) Active Problems:   Alcohol use disorder, severe, in early remission (HCC)   Seizure (HCC)   Alcohol withdrawal syndrome, with delirium (HCC)   AKI (acute kidney injury) (HCC)   Plan: #Seizure 2/2 Alcohol Withdrawal Given IV Keppra 2 g on admission to ED, per neurology, no need for continuing antiepileptic or outpatient neurology follow up at this time unless patient has a repeat episode. Last alcohol use reported to be on day of admission.   Patient  continues to be medically stable for discharge, awaiting SNF placement.  Plan:  - Finished librium taper on 12/16 - CIWA d/c  - Folic acid 1 mg daily  - MVA daily  - IV Thiamine 500 mg  -Naltrexone today, 50mg  daily   Acute Bilateral Pulmonary Embolism TTE 12/12 unremarkable. No evidence of DVT in lower extremities on bilateral LE doppler US per preliminary report. Transitioned to DOAC 12/12.  Plan: - Eliquis 10 mg BID for 7 days (12/12-12/18), then Eliquis 5 mg BID   - Monitor vitals   Hypokalemia: resolved  HTN Hypertensive Urgency- resolved Patient initially presented with SBP > 180, as high as 210 since admission. Given IV hydralazine 2 g x 1 dose. History of HTN, home regimen includes hydrochlorothiazide 12.5 mg daily, irbesartan 300 mg daily and metoprolol 25 mg BID, not on meds for past month.  Can consider adding other antihypertensives as appropriate.  - Continue home dose of amlodipine 10 mg daily and metoprolol 25 mg BID - Irbesartan 300 mg daily  - On telemetry, monitor vitals - BP today is 118/70, will hold off on restarting home hydrochlorothiazide today    Hepatic  Steatosis on CT Hyperammonemia- resolved Ammonia of 295 on admission. [place] could not rule out superimposed inflammation/fibrosis. FIB-4 = 3.70 (advanced fibrosis likely). Albumin WNL and PT/INR = 15.7, 1.2. S/p 1x PO lactulose 10 g.  -Will continue to monitor.  -ammonia improved to <10 and no sx of hepatic encephalopathy, no indication for lactulose    AKI- Resolved Elevated CK, concern for Rhabdomyolysis- improved Baseline Cr ~1.0 (0.97 7 months ago, 1.02 about a year ago). Admission Cr 1.82, Will continue to monitor.   - BMP tomorrow AM    AGMA- resolved    Hyperglycemia- resolved -Will monitor CBGs for now.    Elevated Troponin- peaked ECG on admission showing ST depression in inferior leads. Patient denied chest pain. Troponin I 31 > 207 > 209 > 149 peaked. Will monitor, can repeat ECG if patient with acute chest pain. -Echocardiogram shows LVEF 65-70% without LV regional wall motion abnormalities   RLL Nodule Incidental finding on CTA chest: 7 mm noncalcified nodule in the right lower lobe. Will need outpatient follow up. If patient is high risk for malignancy, Consider non-contrast Chest CT at 18-24 months, if low risk Chest CT is optional.    HLD Chronic. Initially held home atorvastatin 20 mg daily due to CK/concern for rhabdomyolysis. Resumed 12/14.   Best Practice: Diet: Regular diet VTE: NOAC Code: Full  Signature: Faith Rogue, D.O.  Internal Medicine Resident, PGY-1 [facility]  Pager: [phone] 11:05 AM, 04/18/2023   Please contact the on call pager after 5 pm and on weekends at [phone].

## 2023-04-18 NOTE — TOC Progression Note (Signed)
Transition of Care [facility]) - Progression Note    Patient Details  Name: Dan Henry MRN: [id number] Date of Birth: [birth year]  Transition of Care [facility]) CM/SW Contact  Baldemar Lenis, [place] Phone Number: 04/18/2023, 3:26 PM  Clinical Narrative:   CSW following for discharge to SNF. Patient's insurance authorization remains pending at this time. CSW to follow.    Expected Discharge Plan: Skilled Nursing Facility Barriers to Discharge: Continued Medical Work up, English as a second language teacher, Active Substance Use - Placement  Expected Discharge Plan and Services     Post Acute Care Choice: Skilled Nursing Facility Living arrangements for the past 2 months: Apartment                                       Social Determinants of Health (SDOH) Interventions SDOH Screenings   Food Insecurity: Food Insecurity Present (04/13/2023)  Housing: High Risk (04/13/2023)  Transportation Needs: Unmet Transportation Needs (04/13/2023)  Utilities: At Risk (04/13/2023)  Depression (PHQ2-9): Low Risk  (10/22/2020)  Recent Concern: Depression (PHQ2-9) - Medium Risk (09/23/2020)  Tobacco Use: Low Risk  (04/13/2023)    Readmission Risk Interventions     No data to display

## 2023-04-18 NOTE — Plan of Care (Signed)
  Problem: Education: Goal: Knowledge of General Education information will improve Description: Including pain rating scale, medication(s)/side effects and non-pharmacologic comfort measures Outcome: Not Progressing   Problem: Health Behavior/Discharge Planning: Goal: Ability to manage health-related needs will improve Outcome: Not Progressing   Problem: Clinical Measurements: Goal: Ability to maintain clinical measurements within normal limits will improve Outcome: Not Progressing Goal: Will remain free from infection Outcome: Not Progressing Goal: Diagnostic test results will improve Outcome: Not Progressing   Problem: Clinical Measurements: Goal: Will remain free from infection Outcome: Not Progressing   Problem: Clinical Measurements: Goal: Diagnostic test results will improve Outcome: Not Progressing

## 2023-04-19 ENCOUNTER — Inpatient Hospital Stay (HOSPITAL_COMMUNITY): Payer: 59

## 2023-04-19 DIAGNOSIS — J9691 Respiratory failure, unspecified with hypoxia: Secondary | ICD-10-CM

## 2023-04-19 DIAGNOSIS — I2699 Other pulmonary embolism without acute cor pulmonale: Secondary | ICD-10-CM | POA: Diagnosis not present

## 2023-04-19 DIAGNOSIS — A419 Sepsis, unspecified organism: Secondary | ICD-10-CM

## 2023-04-19 LAB — BASIC METABOLIC PANEL
Anion gap: 9 (ref 5–15)
BUN: 20 mg/dL (ref 8–23)
CO2: 21 mmol/L — ABNORMAL LOW (ref 22–32)
Calcium: 9.2 mg/dL (ref 8.9–10.3)
Chloride: 107 mmol/L (ref 98–111)
Creatinine, Ser: 1.21 mg/dL (ref 0.61–1.24)
GFR, Estimated: >60 mL/min (ref >60)
Glucose, Bld: 108 mg/dL — ABNORMAL HIGH (ref 70–99)
Potassium: 4.1 mmol/L (ref 3.5–5.1)
Sodium: 137 mmol/L (ref 135–145)

## 2023-04-19 LAB — LACTIC ACID, PLASMA
Lactic Acid, Venous: 2.1 mmol/L (ref 0.5–1.9)
Lactic Acid, Venous: 2 mmol/L (ref 0.5–1.9)

## 2023-04-19 MED ORDER — THIAMINE MONONITRATE 100 MG PO TABS: 100.0000 mg | ORAL_TABLET | Freq: Every day | ORAL | Status: DC

## 2023-04-19 MED ORDER — ENOXAPARIN (LOVENOX) PATIENT EDUCATION KIT
PACK | Freq: Once | Status: AC
  Filled 2023-04-19: qty 1

## 2023-04-19 MED ORDER — ACETAMINOPHEN 650 MG RE SUPP: 325.0000 mg | RECTAL | Status: DC | PRN

## 2023-04-19 MED ORDER — SODIUM CHLORIDE 0.9 % IV SOLN
3.0000 g | Freq: Three times a day (TID) | INTRAVENOUS | Status: DC
  Administered 2023-04-19 – 2023-04-20 (×3): 3 g via INTRAVENOUS
  Filled 2023-04-19 (×3): qty 8

## 2023-04-19 MED ORDER — ACETAMINOPHEN 650 MG RE SUPP: 325.0000 mg | Freq: Four times a day (QID) | RECTAL | Status: DC | PRN

## 2023-04-19 MED ORDER — GUAIFENESIN-DM 100-10 MG/5ML PO SYRP
10.0000 mL | ORAL_SOLUTION | Freq: Once | ORAL | Status: AC
  Administered 2023-04-19: 10 mL via ORAL
  Filled 2023-04-19: qty 10

## 2023-04-19 MED ORDER — LACTATED RINGERS IV BOLUS
1000.0000 mL | Freq: Once | INTRAVENOUS | Status: AC
  Administered 2023-04-19: 1000 mL via INTRAVENOUS

## 2023-04-19 MED ORDER — ENOXAPARIN SODIUM 80 MG/0.8ML IJ SOSY
1.0000 mg/kg | PREFILLED_SYRINGE | Freq: Two times a day (BID) | INTRAMUSCULAR | Status: DC
  Administered 2023-04-19 – 2023-04-20 (×2): 65 mg via SUBCUTANEOUS
  Filled 2023-04-19 (×2): qty 0.8

## 2023-04-19 MED ORDER — LACTATED RINGERS IV SOLN: INTRAVENOUS | Status: DC

## 2023-04-19 NOTE — Sepsis Progress Note (Signed)
Notified bedside nurse of need to draw 2nd lactic acid.

## 2023-04-19 NOTE — Evaluation (Signed)
Clinical/Bedside Swallow Evaluation Patient Details  Name: Benz Chance MRN: [id number] Date of Birth: 01/06/1951  Today's Date: 04/19/2023 Time: SLP Start Time (ACUTE ONLY): 1045 SLP Stop Time (ACUTE ONLY): 1100 SLP Time Calculation (min) (ACUTE ONLY): 15 min  Past Medical History:  Past Medical History:  Diagnosis Date   Acute kidney injury (HCC) 05/08/2017   Alcohol intoxication (HCC) 11/18/2019   Alcohol withdrawal (HCC) 04/23/2019   Alcohol withdrawal delirium (HCC) 02/14/2019   Hypertension    Hypokalemia 07/19/2017   Lactic acidosis 02/20/2018   Malnutrition of moderate degree 10/24/2018   Nosebleed 04/23/2019   Rhabdomyolysis 10/26/2018   Past Surgical History:  Past Surgical History:  Procedure Laterality Date   NO PAST SURGERIES     HPI:  Pt is a 72 y.o. male who presented 04/12/23 with concerns of persistent shaking and hallucinations. Being worked up for seizure in setting of alcohol withdrawal. Pt also found to have acute bil PE. PMH: HTN, alcohol use disorder    Assessment / Plan / Recommendation  Clinical Impression  Pt seen for clinical swallowing evaluation. Evaluation today limited by lethargy and limited bolus acceptance. Pt lethargic, slow to respond. Kept eyes closed for majority of session. Speech difficult to understanding. On 4L/min O2 via [place]. SOB at rest with RR in mid 20s. Incessant, dry, non-productive cough appreciated before, during, and after POs with no clear relationship; however, pt is at increased risk for aspiration/aspiration PNA given LOA, comorbidities, and respiratory status. Oral deficits appreciated including intermittent oral holding and inconsistent ability to siphon liquids via straw. Suspect oral deficits exacerbated by LOA. Limited PO trials accepted by pt (x1 ice chip, x2 sips of H2O, x2 1/2 tsps of applesauce). Pt stated he does not wish to eat at this time given his pesistent cough. Recommend NPO x ice chips and sips with critical  meds. Will f/u for further PO trials pending improvement in LOA and respiratory status.  SLP Visit Diagnosis: Dysphagia, oropharyngeal phase (R13.12)    Aspiration Risk  Mild aspiration risk;Moderate aspiration risk    Diet Recommendation NPO (may have ice chips and sips of water with critical meds)         Other  Recommendations Oral Care Recommendations: Oral care QID;Oral care prior to ice chip/H20    Recommendations for follow up therapy are one component of a multi-disciplinary discharge planning process, led by the attending physician.  Recommendations may be updated based on patient status, additional functional criteria and insurance authorization.  Follow up Recommendations  (TBD)         Functional Status Assessment Patient has had a recent decline in their functional status and demonstrates the ability to make significant improvements in function in a reasonable and predictable amount of time.  Frequency and Duration min 2x/week  2 weeks       Prognosis Prognosis for improved oropharyngeal function: Fair Barriers to Reach Goals: Cognitive deficits;Severity of deficits      Swallow Study   General HPI: Pt is a 72 y.o. male who presented 04/12/23 with concerns of persistent shaking and hallucinations. Being worked up for seizure in setting of alcohol withdrawal. Pt also found to have acute bil PE. PMH: HTN, alcohol use disorder    Oral/Motor/Sensory Function Overall Oral Motor/Sensory Function: Mild impairment Lingual ROM: Reduced right;Reduced left (vs effort)   Ice Chips Ice chips: Impaired Presentation: Spoon Oral Phase Impairments: Poor awareness of bolus Oral Phase Functional Implications: Oral holding Pharyngeal Phase Impairments:  (delayed cough; appeared c/w baseline  cough)   Thin Liquid Thin Liquid: Impaired Oral Phase Impairments: Reduced labial seal Oral Phase Functional Implications:  (inconsistent ability to siphon from straw) Pharyngeal  Phase  Impairments:  (delayed cough; appeared c/w baseline cough)    Nectar Thick Nectar Thick Liquid: Not tested   Honey Thick Honey Thick Liquid: Not tested   Puree Puree: Impaired Presentation: Spoon Oral Phase Impairments: Poor awareness of bolus Oral Phase Functional Implications: Oral holding Pharyngeal Phase Impairments:  (delayed cough; appeared c/w baseline cough)   Solid     Solid: Not tested     Clyde Canterbury, M.S., CCC-SLP Speech-Language Pathologist Secure Chat Preferred  O: [phone]  Alessandra Bevels Nelline Lio 04/19/2023,11:24 AM

## 2023-04-19 NOTE — Progress Notes (Signed)
   04/19/23 1505  Assess: MEWS Score  Temp (!) 102.8 F (39.3 C)  BP (!) 143/56  MAP (mmHg) 80  Pulse Rate (!) 113  Resp (!) 27  Level of Consciousness Alert  SpO2 97 %  O2 Device Nasal Cannula  O2 Flow Rate (L/min) 4 L/min  Assess: MEWS Score  MEWS Temp 2  MEWS Systolic 0  MEWS Pulse 2  MEWS RR 2  MEWS LOC 0  MEWS Score 6  MEWS Score Color Red  Assess: if the MEWS score is Yellow or Red  Were vital signs accurate and taken at a resting state? Yes  Does the patient meet 2 or more of the SIRS criteria? Yes  Does the patient have a confirmed or suspected source of infection? No  MEWS guidelines implemented  Yes, red  Treat  MEWS Interventions Considered administering scheduled or prn medications/treatments as ordered  Take Vital Signs  Increase Vital Sign Frequency  Red: Q1hr x2, continue Q4hrs until patient remains green for 12hrs  Escalate  MEWS: Escalate Red: Discuss with charge nurse and notify provider. Consider notifying RRT. If remains red for 2 hours consider need for higher level of care  Notify: Charge Nurse/RN  Name of Charge Nurse/RN Notified [place], RN  Provider Notification  Provider Name/Title Faith Rogue, DO  Date Provider Notified 04/19/23  Time Provider Notified 1506  Method of Notification Page  Notification Reason Other (Comment) (Red MEWS)  Provider response En route  Date of Provider Response 04/19/23  Time of Provider Response 1509  Assess: SIRS CRITERIA  SIRS Temperature  1  SIRS Respirations  1  SIRS Pulse 1  SIRS WBC 0  SIRS Score Sum  3

## 2023-04-19 NOTE — Progress Notes (Addendum)
RN notified team that patient was tachycardic, tachypneic requiring supplemental O2 with a fever of 102.81F, as well as aspiration event this morning with vomiting. Team evaluated patient at bed side with the findings below  General: ill appearing  Neuro:Altered, unable to follow commands, moving all four extremities, alert to self and place Cardiac: tachycardic, regular rhythm  Pulmonology: on 4LNC, became hypoxic without supplemental oxygen, clear to auscultate bilaterally  Plan: Sepsis 2/2 Aspiration pneumonia Patient has reported aspiration event overnight and SLP evaluation reveals mild/moderate aspiration risk. Plan: -Collect blood cultures -IVF LR bolus -CXR -IV unasyn -Lactic acid stat -Will change level of care to progressive for pulse oximetry monitoring  -Remain NPO -Will need to transition to Lovenox with PE  -D/c PO meds

## 2023-04-19 NOTE — Progress Notes (Signed)
PHARMACY ANTIBIOTIC CONSULT NOTE   Dan Henry a 72 y.o. male admitted on 04/12/23 with alcohol withdrawal and seizures now with concern for sepsis 2/2 aspiration.  Pharmacy has been consulted for Unasyn dosing.   Tm 102.74F, HR 103, BP  117/58 WBC 7 on 12/17, SCr 1.21  Estimated Creatinine Clearance: 46.2 mL/min (by C-G formula based on SCr of 1.21 mg/dL).  Plan: START Unasyn 3gm IV Q8h Monitor renal function, clinical status, C/S, de-escalation   Allergies:  No Known Allergies  Filed Weights   04/14/23 0941  Weight: 65.9 kg (145 lb 4.5 oz)       Latest Ref Rng & Units 04/18/2023    5:21 AM 04/15/2023    6:04 AM 04/14/2023    6:36 AM  CBC  WBC 4.0 - 10.5 K/uL 7.0  8.6  11.2   Hemoglobin 13.0 - 17.0 g/dL 78.2  95.6  21.3   Hematocrit 39.0 - 52.0 % 35.4  34.1  35.5   Platelets 150 - 400 K/uL 230  176  190     Antibiotics Given (last 72 hours)     None       Antimicrobials this admission: Unasyn 12/18 > c  Microbiology results: 12/18 Bcx: sent  Thank you for allowing pharmacy to be a part of this patient's care.  Trixie Rude, PharmD Clinical Pharmacist 04/19/2023  4:52 PM

## 2023-04-19 NOTE — Progress Notes (Signed)
   04/19/23 0937  Assess: MEWS Score  Temp 98.7 F (37.1 C)  BP (!) 156/71  MAP (mmHg) 96  Pulse Rate (!) 103  Resp (!) 26  Level of Consciousness Alert  SpO2 94 %  O2 Device Nasal Cannula  O2 Flow Rate (L/min) 4 L/min  Assess: MEWS Score  MEWS Temp 0  MEWS Systolic 0  MEWS Pulse 1  MEWS RR 2  MEWS LOC 0  MEWS Score 3  MEWS Score Color Yellow  Assess: if the MEWS score is Yellow or Red  Were vital signs accurate and taken at a resting state? Yes  Does the patient meet 2 or more of the SIRS criteria? No  MEWS guidelines implemented  Yes, yellow  Treat  MEWS Interventions Considered administering scheduled or prn medications/treatments as ordered  Take Vital Signs  Increase Vital Sign Frequency  Yellow: Q2hr x1, continue Q4hrs until patient remains green for 12hrs  Escalate  MEWS: Escalate Yellow: Discuss with charge nurse and consider notifying provider and/or RRT  Notify: Charge Nurse/RN  Name of Charge Nurse/RN Notified [place], RN  Provider Notification  Provider Name/Title Faith Rogue, DO  Date Provider Notified 04/19/23  Time Provider Notified [phone]  Method of Notification Page  Notification Reason Other (Comment) (Yellow MEWS)  Provider response See new orders  Date of Provider Response 04/19/23  Time of Provider Response 0940  Notify: Rapid Response  Name of Rapid Response RN Notified Nikki, RN  Date Rapid Response Notified 04/19/23  Time Rapid Response Notified 0954  Assess: SIRS CRITERIA  SIRS Temperature  0  SIRS Respirations  1  SIRS Pulse 1  SIRS WBC 0  SIRS Score Sum  2

## 2023-04-19 NOTE — Progress Notes (Signed)
PT Cancellation Note  Patient Details Name: Dan Henry MRN: [id number] DOB: January 12, 1951   Cancelled Treatment:    Reason Eval/Treat Not Completed: (P) Fatigue/lethargy limiting ability to participate, attempted x2 throughout day, in AM pt declining d/t fatigue from not sleeping, in PM pt unable to arouse for participation, RN reporting pt up most of night coughing with suspected aspiration. Will check back as schedule allows to continue with PT POC.   Lenora Boys. PTA Acute Rehabilitation Services Office: [phone]    Catalina Antigua 04/19/2023, 2:50 PM

## 2023-04-19 NOTE — Sepsis Progress Note (Signed)
Code Sepsis protocol being monitored by eLink. 

## 2023-04-19 NOTE — Progress Notes (Signed)
OT Cancellation Note  Patient Details Name: Rodolph Patillo MRN: [id number] DOB: 01/13/1951   Cancelled Treatment:    Reason Eval/Treat Not Completed: Fatigue/lethargy limiting ability to participate nurse reports pt was up most of the night coughing d/t suspected aspiration, pt lethargic upon arrival, unable to arouse pt enough to meaningfully participate. Will continue efforts for OT session as time allows.  Lenor Derrick., COTA/L Acute Rehabilitation Services [phone]   Barron Schmid 04/19/2023, 1:49 PM

## 2023-04-19 NOTE — Progress Notes (Addendum)
   The night team got paged around 1:13 AM by the nurse due to concerns for acute cough that the patient didn't have before. The patient was seen at bedside around 1:24 AM. On exam, lungs are clear bilaterally and no abnormal heart sounds were appreciated. He was not in any acute distress and was sating well on RA but tachycardic in the 112. He was laying in bed and not coughing when I got there. He started coughing after I asked him to sit upright for the lung exam. Nurse is concerned for possible aspiration while patient was eating rice  for dinner since the cough began while patient was eating. Doesn't appear there is any ongoing infectious processes at this time but will get a chest XR to assess for a possible lung pathology. - F/U on CXR    Signature: Kathleen Lime , MD Internal Medicine Resident, PGY-1 [facility]  Pager: [phone] 1:26 AM, 04/19/2023   Please contact the on call pager after 5 pm and on weekends at [phone].

## 2023-04-19 NOTE — Progress Notes (Signed)
HD#7 SUBJECTIVE:  Patient Summary: Dan Henry is a 72 yo male with PMH of AUD w/ previous delirium tremens/multiple hospitalizations, HTN, HLD, and anxiety who presented with concerns of persistent shaking and was admitted to the Internal Medicine Teaching Service on 12/11 for seizures due to alcohol withdrawal.   Overnight Events: Concern for aspiration event, CXR was normal, Pt is asymptomatic   Interim History:  Patient reports feeling well and denies acute complaints. He is no longer coughing.   OBJECTIVE:  Vital Signs: Vitals:   04/18/23 1953 04/18/23 2340 04/19/23 0350 04/19/23 0819  BP: 133/67 (!) 172/74 (!) 161/66 119/64  Pulse: 84 (!) 117 (!) 106 90  Resp: 16 20 16 17   Temp: 97.7 F (36.5 C) 99.4 F (37.4 C) 99 F (37.2 C) 98.9 F (37.2 C)  TempSrc: Oral Oral Oral Oral  SpO2: 93% 95% 96% 99%  Weight:      Height:        Intake/Output Summary (Last 24 hours) at 04/19/2023 0916 Last data filed at 04/18/2023 2340 Gross per 24 hour  Intake 320 ml  Output 802 ml  Net -482 ml   Net IO Since Admission: 1,545.28 mL [04/19/23 0916]  Physical Exam: Physical Exam: General:NAD, sitting in bed Cardiac:regular rate and rhythm  Pulmonary:clear to auscultate bilaterally, normal effort on RA Abdominal:soft, non-tender, bowel sounds present  Neuro:alert and awake Skin:warm and dry  Psych:  normal mood and affect      Latest Ref Rng & Units 04/18/2023    5:21 AM 04/15/2023    6:04 AM 04/14/2023    6:36 AM  CBC  WBC 4.0 - 10.5 K/uL 7.0  8.6  11.2   Hemoglobin 13.0 - 17.0 g/dL 16.1  09.6  04.5   Hematocrit 39.0 - 52.0 % 35.4  34.1  35.5   Platelets 150 - 400 K/uL 230  176  190         Latest Ref Rng & Units 04/19/2023    5:58 AM 04/18/2023    5:21 AM 04/17/2023    8:04 AM  CMP  Glucose 70 - 99 mg/dL 409  811  97   BUN 8 - 23 mg/dL 20  21  17    Creatinine 0.61 - 1.24 mg/dL 9.14  7.82  9.56   Sodium 135 - 145 mmol/L 137  136  137   Potassium 3.5 - 5.1  mmol/L 4.1  4.1  4.1   Chloride 98 - 111 mmol/L 107  108  107   CO2 22 - 32 mmol/L 21  22  21    Calcium 8.9 - 10.3 mg/dL 9.2  9.1  9.4   Total Protein 6.5 - 8.1 g/dL   7.3   Total Bilirubin <1.2 mg/dL   0.8   Alkaline Phos 38 - 126 U/L   49   AST 15 - 41 U/L   35   ALT 0 - 44 U/L   23     Ammonia 04/17/2023: <10  ASSESSMENT/PLAN:  Assessment: Principal Problem:   Acute pulmonary embolism (HCC) Active Problems:   Alcohol use disorder, severe, in early remission (HCC)   Seizure (HCC)   Alcohol withdrawal syndrome, with delirium (HCC)   AKI (acute kidney injury) (HCC)   Plan: #Seizure 2/2 Alcohol Withdrawal Given IV Keppra 2 g on admission to ED, per neurology, no need for continuing antiepileptic or outpatient neurology follow up at this time unless patient has a repeat episode. Last alcohol use reported to be on day of  admission.   Patient continues to be medically stable for discharge, awaiting SNF placement.  Plan:  - Finished librium taper on 12/16 - CIWA d/c  - Folic acid 1 mg daily  - MVA daily  - Thiamine 100 mg tablet  -Naltrexone today, 50mg  daily   Acute Bilateral Pulmonary Embolism TTE 12/12 unremarkable. No evidence of DVT in lower extremities on bilateral LE doppler US per preliminary report. Transitioned to DOAC 12/12.  Plan: - Eliquis 10 mg BID for 7 days (12/12-12/18), then Eliquis 5 mg BID   - Monitor vitals   Concern for Aspiration Patient had concern for aspiration event overnight. CXR is normal. Patient remains asymptomatic, but RN reported apprehension in patient taking medications due to fear of aspiration.  -SLP evaluation   Hypokalemia: resolved  HTN Hypertensive Urgency- resolved Patient initially presented with SBP > 180, as high as 210 since admission. Given IV hydralazine 2 g x 1 dose. History of HTN, home regimen includes hydrochlorothiazide 12.5 mg daily, irbesartan 300 mg daily and metoprolol 25 mg BID, not on meds for past month.  Can  consider adding other antihypertensives as appropriate.  - Continue home dose of amlodipine 10 mg daily and metoprolol 25 mg BID - Irbesartan 300 mg daily  - On telemetry, monitor vitals - Will continue to monitor and consider starting hydrochlorothiazide if patient becomes hypertensive    Hepatic Steatosis on CT Hyperammonemia- resolved Ammonia of 295 on admission. [place] could not rule out superimposed inflammation/fibrosis. FIB-4 = 3.70 (advanced fibrosis likely). Albumin WNL and PT/INR = 15.7, 1.2. S/p 1x PO lactulose 10 g.  -Will continue to monitor.  -ammonia improved to <10 and no sx of hepatic encephalopathy, no indication for lactulose    AKI- Resolved Elevated CK, concern for Rhabdomyolysis- improved Baseline Cr ~1.0 (0.97 7 months ago, 1.02 about a year ago). -Lab holiday    AGMA- resolved    Hyperglycemia- resolved -Will monitor CBGs for now.    Elevated Troponin- peaked ECG on admission showing ST depression in inferior leads. Patient denied chest pain. Troponin I 31 > 207 > 209 > 149 peaked. Will monitor, can repeat ECG if patient with acute chest pain. -Echocardiogram shows LVEF 65-70% without LV regional wall motion abnormalities   RLL Nodule Incidental finding on CTA chest: 7 mm noncalcified nodule in the right lower lobe. Will need outpatient follow up. If patient is high risk for malignancy, Consider non-contrast Chest CT at 18-24 months, if low risk Chest CT is optional.    HLD Chronic. Initially held home atorvastatin 20 mg daily due to CK/concern for rhabdomyolysis. Resumed 12/14.   Best Practice: Diet: Regular diet VTE: NOAC Code: Full  Signature: Faith Rogue, D.O.  Internal Medicine Resident, PGY-1 [facility]  Pager: [phone] 9:16 AM, 04/19/2023   Please contact the on call pager after 5 pm and on weekends at [phone].

## 2023-04-20 DIAGNOSIS — J690 Pneumonitis due to inhalation of food and vomit: Secondary | ICD-10-CM | POA: Diagnosis not present

## 2023-04-20 DIAGNOSIS — I2699 Other pulmonary embolism without acute cor pulmonale: Secondary | ICD-10-CM | POA: Diagnosis not present

## 2023-04-20 LAB — PROTIME-INR
INR: 2 — ABNORMAL HIGH (ref 0.8–1.2)
Prothrombin Time: 22.7 s — ABNORMAL HIGH (ref 11.4–15.2)

## 2023-04-20 LAB — BASIC METABOLIC PANEL
Anion gap: 6 (ref 5–15)
BUN: 17 mg/dL (ref 8–23)
CO2: 23 mmol/L (ref 22–32)
Calcium: 8.8 mg/dL — ABNORMAL LOW (ref 8.9–10.3)
Chloride: 109 mmol/L (ref 98–111)
Creatinine, Ser: 1.21 mg/dL (ref 0.61–1.24)
GFR, Estimated: >60 mL/min (ref >60)
Glucose, Bld: 95 mg/dL (ref 70–99)
Potassium: 3.6 mmol/L (ref 3.5–5.1)
Sodium: 138 mmol/L (ref 135–145)

## 2023-04-20 LAB — CBC
HCT: 30.6 % — ABNORMAL LOW (ref 39.0–52.0)
Hemoglobin: 10 g/dL — ABNORMAL LOW (ref 13.0–17.0)
MCH: 28.4 pg (ref 26.0–34.0)
MCHC: 32.7 g/dL (ref 30.0–36.0)
MCV: 86.9 fL (ref 80.0–100.0)
Platelets: 239 10*3/uL (ref 150–400)
RBC: 3.52 MIL/uL — ABNORMAL LOW (ref 4.22–5.81)
RDW: 17.8 % — ABNORMAL HIGH (ref 11.5–15.5)
WBC: 10.1 10*3/uL (ref 4.0–10.5)
nRBC: 0 % (ref 0.0–0.2)

## 2023-04-20 LAB — LACTIC ACID, PLASMA: Lactic Acid, Venous: 1.2 mmol/L (ref 0.5–1.9)

## 2023-04-20 MED ORDER — THIAMINE MONONITRATE 100 MG PO TABS
100.0000 mg | ORAL_TABLET | Freq: Every day | ORAL | Status: DC
  Administered 2023-04-20 – 2023-04-22 (×3): 100 mg via ORAL
  Filled 2023-04-20 (×3): qty 1

## 2023-04-20 MED ORDER — LACTULOSE 10 GM/15ML PO SOLN: 10.0000 g | Freq: Every day | ORAL | Status: DC | PRN

## 2023-04-20 MED ORDER — POTASSIUM CHLORIDE CRYS ER 20 MEQ PO TBCR
40.0000 meq | EXTENDED_RELEASE_TABLET | Freq: Once | ORAL | Status: AC
  Administered 2023-04-20: 40 meq via ORAL
  Filled 2023-04-20: qty 2

## 2023-04-20 MED ORDER — AMOXICILLIN-POT CLAVULANATE 875-125 MG PO TABS
1.0000 | ORAL_TABLET | Freq: Two times a day (BID) | ORAL | Status: DC
  Administered 2023-04-20 – 2023-04-22 (×5): 1 via ORAL
  Filled 2023-04-20 (×5): qty 1

## 2023-04-20 MED ORDER — APIXABAN 5 MG PO TABS
5.0000 mg | ORAL_TABLET | Freq: Two times a day (BID) | ORAL | Status: DC
  Administered 2023-04-20 – 2023-04-22 (×5): 5 mg via ORAL
  Filled 2023-04-20 (×5): qty 1

## 2023-04-20 MED ORDER — FOLIC ACID 1 MG PO TABS
1.0000 mg | ORAL_TABLET | Freq: Every day | ORAL | Status: DC
  Administered 2023-04-20 – 2023-04-22 (×3): 1 mg via ORAL
  Filled 2023-04-20 (×3): qty 1

## 2023-04-20 NOTE — Progress Notes (Signed)
Physical Therapy Treatment Patient Details Name: Dan Henry MRN: [id number] DOB: [birth year] Today's Date: 04/20/2023   History of Present Illness Pt is a 72 y.o. male who presented 04/12/23 with concerns of persistent shaking and hallucinations. Being worked up for seizure in setting of alcohol withdrawal. Pt also found to have acute bil PE. 12/18 stay complicated by aspiration pneumonia PMH: HTN, alcohol use disorder    PT Comments  Pt resting in bed on arrival and agreeable to session with steady progress towards acute goals. Pt limited this session due to fatigue, however with good effort overall. Physically pt requiring supervision to come to sitting EOB with increased time and cues for initiation. Pt requiring min A to boost to stand, and mod A to gain balance as pt with posterior lean on rise, with pt able to recognize imbalance but unable to correct without assist. Pt needing min A for gait with RW for support. SpO2 dropping to 88% on RA during activity, reapplied 2L with SpO2 improving to 95%. Current plan remains appropriate to address deficits and maximize functional independence and decrease caregiver burden. Pt continues to benefit from skilled PT services to progress toward functional mobility goals.     If plan is discharge home, recommend the following: A lot of help with walking and/or transfers;A lot of help with bathing/dressing/bathroom;Assistance with cooking/housework;Direct supervision/assist for medications management;Direct supervision/assist for financial management;Assist for transportation;Supervision due to cognitive status   Can travel by private vehicle     Yes  Equipment Recommendations  Rolling walker (2 wheels)    Recommendations for Other Services       Precautions / Restrictions Precautions Precautions: Fall Restrictions Weight Bearing Restrictions Per Provider Order: No     Mobility  Bed Mobility Overal bed mobility: Needs Assistance Bed  Mobility: Supine to Sit     Supine to sit: Supervision, HOB elevated, Used rails Sit to supine: HOB elevated, Supervision, Min assist   General bed mobility comments: VC to initiate task. and  increased time to scoot out to EOB; min A to scoot out to [facility] once supine    Transfers Overall transfer level: Needs assistance Equipment used: Rolling walker (2 wheels) Transfers: Sit to/from Stand, Bed to chair/wheelchair/BSC Sit to Stand: Min assist, Mod assist           General transfer comment: min A to boost to stand and mod A to gain balance once standing d/t posterior lean    Ambulation/Gait Ambulation/Gait assistance: Min assist Gait Distance (Feet): 150 Feet Assistive device: Rolling walker (2 wheels) Gait Pattern/deviations: Decreased step length - right, Decreased step length - left, Decreased stride length, Leaning posteriorly, Shuffle, Knee flexed in stance - right, Knee flexed in stance - left Gait velocity: reduced     General Gait Details: min A for steadying assist as pt with some postural sway, some staggering steps with turning needing min A to maintain balance   Stairs             Wheelchair Mobility     Tilt Bed    Modified Rankin (Stroke Patients Only)       Balance Overall balance assessment: Needs assistance Sitting-balance support: Feet supported Sitting balance-Leahy Scale: Fair Sitting balance - Comments: sitting EOB   Standing balance support: Bilateral upper extremity supported, During functional activity, Reliant on assistive device for balance Standing balance-Leahy Scale: Poor Standing balance comment: reliant on UE supprot, blokcing LEs at EOB on initial rise to stand needing mod A to shift weight anterior  and gain balance                            Cognition Arousal: Alert Behavior During Therapy: WFL for tasks assessed/performed Overall Cognitive Status: Impaired/Different from baseline Area of Impairment: Problem  solving, Safety/judgement, Attention, Following commands                   Current Attention Level: Selective   Following Commands: Follows one step commands consistently, Follows one step commands with increased time Safety/Judgement: Decreased awareness of safety, Decreased awareness of deficits   Problem Solving: Slow processing, Decreased initiation, Requires verbal cues General Comments: increased time for processing, sequencing and initiating mobility        Exercises      General Comments General comments (skin integrity, edema, etc.): pt on 2L [place] on arrival ,asking to doff due to feeling uncomfortbale, SpO2 96% on RA, dropping to 88% with activity, reapplied 2L at end of session with SpO2 improving to 95% at end of session, HR up to 128bpm with activity      Pertinent Vitals/Pain Pain Assessment Pain Assessment: Faces Faces Pain Scale: No hurt Pain Intervention(s): Monitored during session    Home Living                          Prior Function            PT Goals (current goals can now be found in the care plan section) Acute Rehab PT Goals Patient Stated Goal: to improve PT Goal Formulation: With patient Time For Goal Achievement: 04/27/23 Progress towards PT goals: Progressing toward goals    Frequency    Min 1X/week      PT Plan      Co-evaluation              AM-PAC PT "6 Clicks" Mobility   Outcome Measure  Help needed turning from your back to your side while in a flat bed without using bedrails?: A Little Help needed moving from lying on your back to sitting on the side of a flat bed without using bedrails?: A Little Help needed moving to and from a bed to a chair (including a wheelchair)?: A Little Help needed standing up from a chair using your arms (e.g., wheelchair or bedside chair)?: A Lot Help needed to walk in hospital room?: A Little Help needed climbing 3-5 steps with a railing? : Total 6 Click Score: 15     End of Session Equipment Utilized During Treatment: Gait belt Activity Tolerance: Patient tolerated treatment well;Patient limited by fatigue Patient left: with call bell/phone within reach;in chair;with chair alarm set Nurse Communication: Mobility status PT Visit Diagnosis: Unsteadiness on feet (R26.81);Other abnormalities of gait and mobility (R26.89);Muscle weakness (generalized) (M62.81);Difficulty in walking, not elsewhere classified (R26.2)     Time: 1610-9604 PT Time Calculation (min) (ACUTE ONLY): 19 min  Charges:    $Gait Training: 8-22 mins PT General Charges $$ ACUTE PT VISIT: 1 Visit                     Roniqua Kintz R. PTA Acute Rehabilitation Services Office: [phone]   Catalina Antigua 04/20/2023, 10:00 AM

## 2023-04-20 NOTE — Progress Notes (Addendum)
Speech Language Pathology Treatment: Dysphagia  Patient Details Name: Dan Henry MRN: [id number] DOB: 08/04/1950 Today's Date: 04/20/2023 Time: 6433-2951 SLP Time Calculation (min) (ACUTE ONLY): 13 min  Assessment / Plan / Recommendation Clinical Impression  Pt had baseline dry cough prior to po's and coughed several times throughout session however did not appear related to po's as also seen during yesterday's assessment with other SLP. He was initially reluctant to eat stating "it would make him throw up" but eventually agreeable to po's. Today he did not exhibit oral holding and was able to consume water from straw without difficulty. He is endentulous but able to masticate graham cracker x 3 timely without oral residue. Pt has pna per MD. He does not appear to be aspirating from today's observation. Pt has history of ETOH abuse and suspect he could have esophageal involvement to cause dry chronic cough. SLP will order Dys 3, thin liquids, pills with water. ST will follow and if he exhibits s/s aspiration an MBS can be performed if warranted.    HPI HPI: Pt is a 72 y.o. male who presented 04/12/23 with concerns of persistent shaking and hallucinations. Being worked up for seizure in setting of alcohol withdrawal. Pt also found to have acute bil PE. PMH: HTN, alcohol use disorder. Found to have possible pna per MD.      SLP Plan  Continue with current plan of care      Recommendations for follow up therapy are one component of a multi-disciplinary discharge planning process, led by the attending physician.  Recommendations may be updated based on patient status, additional functional criteria and insurance authorization.    Recommendations  Diet recommendations: Dysphagia 3 (mechanical soft);Thin liquid Liquids provided via: Cup;Straw Medication Administration: Whole meds with liquid Supervision: Patient able to self feed;Intermittent supervision to cue for compensatory  strategies Compensations: Slow rate;Small sips/bites Postural Changes and/or Swallow Maneuvers: Seated upright 90 degrees                  Oral care BID   Intermittent Supervision/Assistance Dysphagia, oropharyngeal phase (R13.12)     Continue with current plan of care     Royce Macadamia  04/20/2023, 8:51 AM

## 2023-04-20 NOTE — TOC Progression Note (Signed)
Transition of Care [facility]) - Progression Note    Patient Details  Name: Dan Henry MRN: [id number] Date of Birth: 03-Oct-1950  [facility] CM/SW Contact  Baldemar Lenis, [place] Phone Number: 04/20/2023, 1:14 PM  Clinical Narrative:   CSW spoke with patient's niece, [name], this morning to provide update. Noting patient's improvements, will restart authorization request for SNF. CSW to follow.    Expected Discharge Plan: Skilled Nursing Facility Barriers to Discharge: Continued Medical Work up, English as a second language teacher, Active Substance Use - Placement  Expected Discharge Plan and Services     Post Acute Care Choice: Skilled Nursing Facility Living arrangements for the past 2 months: Apartment                                       Social Determinants of Health (SDOH) Interventions SDOH Screenings   Food Insecurity: Food Insecurity Present (04/13/2023)  Housing: High Risk (04/13/2023)  Transportation Needs: Unmet Transportation Needs (04/13/2023)  Utilities: At Risk (04/13/2023)  Depression (PHQ2-9): Low Risk  (10/22/2020)  Recent Concern: Depression (PHQ2-9) - Medium Risk (09/23/2020)  Tobacco Use: Low Risk  (04/13/2023)    Readmission Risk Interventions     No data to display

## 2023-04-20 NOTE — TOC Progression Note (Signed)
Transition of Care [facility]) - Progression Note    Patient Details  Name: Dan Henry MRN: [id number] Date of Birth: 09/28/1950  [facility]) CM/SW Contact  Baldemar Lenis, [place] Phone Number: 04/20/2023, 1:08 PM  Clinical Narrative:   CSW updated by RN that patient had a decline this morning, now not medically stable. Insurance requested peer to peer review, but CSW withdrew authorization request due to lack of medical readiness. CSW to reinitiate insurance authorization once patient is stable. CSW updated [employer].     Expected Discharge Plan: Skilled Nursing Facility Barriers to Discharge: Continued Medical Work up, English as a second language teacher, Active Substance Use - Placement  Expected Discharge Plan and Services     Post Acute Care Choice: Skilled Nursing Facility Living arrangements for the past 2 months: Apartment                                       Social Determinants of Health (SDOH) Interventions SDOH Screenings   Food Insecurity: Food Insecurity Present (04/13/2023)  Housing: High Risk (04/13/2023)  Transportation Needs: Unmet Transportation Needs (04/13/2023)  Utilities: At Risk (04/13/2023)  Depression (PHQ2-9): Low Risk  (10/22/2020)  Recent Concern: Depression (PHQ2-9) - Medium Risk (09/23/2020)  Tobacco Use: Low Risk  (04/13/2023)    Readmission Risk Interventions     No data to display

## 2023-04-20 NOTE — Plan of Care (Signed)

## 2023-04-20 NOTE — Progress Notes (Signed)
Occupational Therapy Treatment Patient Details Name: Dan Henry MRN: [id number] DOB: 04-Oct-1950 Today's Date: 04/20/2023   History of present illness Pt is a 72 y.o. male who presented 04/12/23 with concerns of persistent shaking and hallucinations. Being worked up for seizure in setting of alcohol withdrawal. Pt also found to have acute bil PE. 12/18 stay complicated by aspiration pneumonia PMH: HTN, alcohol use disorder   OT comments  Pt is progressing towards OT goals this session. IN room mobility with RW at min A for toilet transfer, peri care, sink level grooming, and then ambulation to RW. Pt continues to demonstrate deficits in cognition, balance, safety awareness, problem solving and activity tolerance. Pt is very pleasant, cooperative, and motivated to return to PLOF. OT will continue to follow acutely. POC remains appropriate that Pt get <3 hour daily rehab post-acute to maximize safety and independence in ADL and mobility.       If plan is discharge home, recommend the following:  A little help with walking and/or transfers;A little help with bathing/dressing/bathroom;Assistance with cooking/housework   Equipment Recommendations  Tub/shower seat    Recommendations for Other Services      Precautions / Restrictions Precautions Precautions: Fall Restrictions Weight Bearing Restrictions Per Provider Order: No       Mobility Bed Mobility Overal bed mobility: Needs Assistance Bed Mobility: Supine to Sit     Supine to sit: Supervision, HOB elevated, Used rails Sit to supine: HOB elevated, Supervision, Min assist   General bed mobility comments: VC to initiate task. and  increased time to scoot out to EOB; min A to scoot out to [facility] once supine    Transfers Overall transfer level: Needs assistance Equipment used: Rolling walker (2 wheels) Transfers: Sit to/from Stand, Bed to chair/wheelchair/BSC Sit to Stand: Min assist, Mod assist           General transfer  comment: min A to boost to stand and mod A to gain balance once standing d/t posterior lean     Balance Overall balance assessment: Needs assistance Sitting-balance support: Feet supported Sitting balance-Leahy Scale: Fair Sitting balance - Comments: sitting EOB   Standing balance support: Bilateral upper extremity supported, During functional activity, Reliant on assistive device for balance Standing balance-Leahy Scale: Poor Standing balance comment: reliant on UE supprot, blokcing LEs at EOB on initial rise to stand needing mod A to shift weight anterior and gain balance                           ADL either performed or assessed with clinical judgement   ADL Overall ADL's : Needs assistance/impaired Eating/Feeding: Set up;Sitting Eating/Feeding Details (indicate cue type and reason): in recliner, finger foods Grooming: Wash/dry hands;Contact guard assist;Standing Grooming Details (indicate cue type and reason): sink level, fatigues quickly                 Toilet Transfer: Minimal assistance;Cueing for safety;Cueing for sequencing;Ambulation;Rolling walker (2 wheels) Toilet Transfer Details (indicate cue type and reason): into bathroom Toileting- Clothing Manipulation and Hygiene: Supervision/safety;Sitting/lateral lean       Functional mobility during ADLs: Minimal assistance;Rolling walker (2 wheels);Cueing for safety;Cueing for sequencing General ADL Comments: cognition remains to be biggest hurdle along side decreased activity tolerance and mild balance. When pt set up to perform ADL and he has to problem solve he cannot do it without multimodal cues  - even familiar ADL tasks    Extremity/Trunk Assessment  Vision       Perception     Praxis      Cognition Arousal: Alert Behavior During Therapy: WFL for tasks assessed/performed Overall Cognitive Status: Impaired/Different from baseline Area of Impairment: Problem solving,  Safety/judgement, Attention, Following commands                   Current Attention Level: Selective   Following Commands: Follows one step commands consistently, Follows one step commands with increased time Safety/Judgement: Decreased awareness of safety, Decreased awareness of deficits   Problem Solving: Slow processing, Decreased initiation, Requires verbal cues General Comments: increased time for processing, sequencing and initiating functional tasks.        Exercises      Shoulder Instructions       General Comments Pt found in room on RA with SpO2 94%, after in room mobility to bathrooom and sink level grooming SpO2 was 88%. Pt declined supplemental O2 but was able to bring up to 91% with PLB    Pertinent Vitals/ Pain       Pain Assessment Pain Assessment: Faces Faces Pain Scale: No hurt Pain Intervention(s): Monitored during session, Repositioned  Home Living                                          Prior Functioning/Environment              Frequency  Min 1X/week        Progress Toward Goals  OT Goals(current goals can now be found in the care plan section)  Progress towards OT goals: Progressing toward goals  Acute Rehab OT Goals Patient Stated Goal: go to rehab OT Goal Formulation: With patient Time For Goal Achievement: 04/28/23 Potential to Achieve Goals: Good  Plan      Co-evaluation                 AM-PAC OT "6 Clicks" Daily Activity     Outcome Measure   Help from another person eating meals?: None Help from another person taking care of personal grooming?: A Little Help from another person toileting, which includes using toliet, bedpan, or urinal?: A Little Help from another person bathing (including washing, rinsing, drying)?: A Little Help from another person to put on and taking off regular upper body clothing?: A Little Help from another person to put on and taking off regular lower body  clothing?: A Little 6 Click Score: 19    End of Session Equipment Utilized During Treatment: Gait belt;Rolling walker (2 wheels)  OT Visit Diagnosis: Unsteadiness on feet (R26.81);Muscle weakness (generalized) (M62.81)   Activity Tolerance Patient tolerated treatment well   Patient Left in chair;with call bell/phone within reach;with chair alarm set   Nurse Communication          Time: 1253-1310 OT Time Calculation (min): 17 min  Charges: OT General Charges $OT Visit: 1 Visit OT Treatments $Self Care/Home Management : 8-22 mins  Nyoka Cowden OTR/L Acute Rehabilitation Services Office: [phone]   Evern Bio [facility] 04/20/2023, 1:26 PM

## 2023-04-20 NOTE — Progress Notes (Signed)
HD#8 SUBJECTIVE:  Patient Summary: Dan Henry is a 72 yo male with PMH of AUD w/ previous delirium tremens/multiple hospitalizations, HTN, HLD, and anxiety who presented with concerns of persistent shaking and was admitted to the Internal Medicine Teaching Service on 12/11 for seizures due to alcohol withdrawal.   Interim History:  Patient reports feeling better today. He denies SOB but does have a cough. He reports abdominal pain, last BM was yesterday.   OBJECTIVE:  Vital Signs: Vitals:   04/19/23 1824 04/19/23 1915 04/19/23 2340 04/20/23 0334  BP: (!) 140/58 133/74 132/73 130/76  Pulse: (!) 109 100 (!) 103 100  Resp: 18 20 15 16   Temp: (!) 101.1 F (38.4 C) (!) 101.1 F (38.4 C) 98.6 F (37 C) 99.2 F (37.3 C)  TempSrc: Oral Oral Oral Oral  SpO2: 96%  100% 100%  Weight:      Height:        Intake/Output Summary (Last 24 hours) at 04/20/2023 1046 Last data filed at 04/20/2023 1610 Gross per 24 hour  Intake 1209.21 ml  Output 580 ml  Net 629.21 ml   Net IO Since Admission: 2,174.49 mL [04/20/23 1046]  Physical Exam: Physical Exam: General:well appearing, conversing, sitting in bed eating breakfast Cardiac:tachycardic, regular rhythm Pulmonary: left lower lob crackles auscultated, normal effort  Abdominal:distended, bowel sounds present, LUQ tenderness Neuro:alert, awake, participating in conversation  Skin:warm and dry      Latest Ref Rng & Units 04/20/2023    6:20 AM 04/18/2023    5:21 AM 04/15/2023    6:04 AM  CBC  WBC 4.0 - 10.5 K/uL 10.1  7.0  8.6   Hemoglobin 13.0 - 17.0 g/dL 96.0  45.4  09.8   Hematocrit 39.0 - 52.0 % 30.6  35.4  34.1   Platelets 150 - 400 K/uL 239  230  176         Latest Ref Rng & Units 04/20/2023    6:20 AM 04/19/2023    5:58 AM 04/18/2023    5:21 AM  CMP  Glucose 70 - 99 mg/dL 95  119  147   BUN 8 - 23 mg/dL 17  20  21    Creatinine 0.61 - 1.24 mg/dL 8.29  5.62  1.30   Sodium 135 - 145 mmol/L 138  137  136   Potassium  3.5 - 5.1 mmol/L 3.6  4.1  4.1   Chloride 98 - 111 mmol/L 109  107  108   CO2 22 - 32 mmol/L 23  21  22    Calcium 8.9 - 10.3 mg/dL 8.8  9.2  9.1     Ammonia 04/17/2023: <10  ASSESSMENT/PLAN:  Assessment: Principal Problem:   Acute pulmonary embolism (HCC) Active Problems:   Alcohol use disorder, severe, in early remission (HCC)   Seizure (HCC)   Alcohol withdrawal syndrome, with delirium (HCC)   AKI (acute kidney injury) (HCC)   Sepsis (HCC)   Hypoxic respiratory failure (HCC)   Plan:  Sepsis 2/2 Aspiration Pneumonia Patient appears well today, improving on Unasyn.   -Transition to Augmentin BID for a total treatment course of 5 days -Blood cultures collected -Lactic acid elevation has resolved -Remains hemodynamically stable  -Patient may discharge once started on oral regimen  -Continue SLP recommendations of DYS 3 diet   Abdominal Distension Possibly due to constipation -Will order lactulose 10 g  #Seizure 2/2 Alcohol Withdrawal Given IV Keppra 2 g on admission to ED, per neurology, no need for continuing antiepileptic or outpatient  neurology follow up at this time unless patient has a repeat episode. Last alcohol use reported to be on day of admission.   Patient continues to be medically stable for discharge, awaiting SNF placement.  Plan:  - Finished librium taper on 12/16 - CIWA d/c  - Folic acid 1 mg daily  - Thiamine 100 mg tablet  -Naltrexone today, 50mg  daily   Acute Bilateral Pulmonary Embolism TTE 12/12 unremarkable. No evidence of DVT in lower extremities on bilateral LE doppler US per preliminary report. Transitioned to DOAC 12/12.  Plan: - Eliquis 5 mg BID   - Monitor vitals    HTN Hypertensive Urgency- resolved Patient initially presented with SBP > 180, as high as 210 since admission. Given IV hydralazine 2 g x 1 dose. History of HTN, home regimen includes hydrochlorothiazide 12.5 mg daily, irbesartan 300 mg daily and metoprolol 25 mg BID, not on  meds for past month.  Can consider adding other antihypertensives as appropriate.  - Continue home dose of amlodipine 10 mg daily and metoprolol 25 mg BID - Irbesartan 300 mg daily  - On telemetry, monitor vitals - Will continue to monitor and consider starting hydrochlorothiazide if patient becomes hypertensive    Hepatic Steatosis on CT Hyperammonemia- resolved   AKI- Resolved Elevated CK, concern for Rhabdomyolysis- improved Baseline Cr ~1.0 (0.97 7 months ago, 1.02 about a year ago). -Lab holiday    AGMA- resolved    Hyperglycemia- resolved -Will monitor CBGs for now.    Elevated Troponin- peaked ECG on admission showing ST depression in inferior leads. Patient denied chest pain. Troponin I 31 > 207 > 209 > 149 peaked. Will monitor, can repeat ECG if patient with acute chest pain. -Echocardiogram shows LVEF 65-70% without LV regional wall motion abnormalities   RLL Nodule Incidental finding on CTA chest: 7 mm noncalcified nodule in the right lower lobe. Will need outpatient follow up. If patient is high risk for malignancy, Consider non-contrast Chest CT at 18-24 months, if low risk Chest CT is optional.    HLD Chronic. Initially held home atorvastatin 20 mg daily due to CK/concern for rhabdomyolysis. Resumed 12/14.   Best Practice: Diet: Regular diet VTE: NOAC Code: Full  Signature: Faith Rogue, D.O.  Internal Medicine Resident, PGY-1 [facility]  Pager: [phone] 10:46 AM, 04/20/2023   Please contact the on call pager after 5 pm and on weekends at [phone].

## 2023-04-21 ENCOUNTER — Inpatient Hospital Stay (HOSPITAL_COMMUNITY): Payer: 59

## 2023-04-21 DIAGNOSIS — I2699 Other pulmonary embolism without acute cor pulmonale: Secondary | ICD-10-CM | POA: Diagnosis not present

## 2023-04-21 LAB — BASIC METABOLIC PANEL
Anion gap: 10 (ref 5–15)
BUN: 13 mg/dL (ref 8–23)
CO2: 21 mmol/L — ABNORMAL LOW (ref 22–32)
Calcium: 9.4 mg/dL (ref 8.9–10.3)
Chloride: 108 mmol/L (ref 98–111)
Creatinine, Ser: 1.04 mg/dL (ref 0.61–1.24)
GFR, Estimated: >60 mL/min (ref >60)
Glucose, Bld: 96 mg/dL (ref 70–99)
Potassium: 3.8 mmol/L (ref 3.5–5.1)
Sodium: 139 mmol/L (ref 135–145)

## 2023-04-21 LAB — CBC
HCT: 32.9 % — ABNORMAL LOW (ref 39.0–52.0)
Hemoglobin: 10.8 g/dL — ABNORMAL LOW (ref 13.0–17.0)
MCH: 28.4 pg (ref 26.0–34.0)
MCHC: 32.8 g/dL (ref 30.0–36.0)
MCV: 86.6 fL (ref 80.0–100.0)
Platelets: 283 10*3/uL (ref 150–400)
RBC: 3.8 MIL/uL — ABNORMAL LOW (ref 4.22–5.81)
RDW: 17.2 % — ABNORMAL HIGH (ref 11.5–15.5)
WBC: 9.3 10*3/uL (ref 4.0–10.5)
nRBC: 0 % (ref 0.0–0.2)

## 2023-04-21 MED ORDER — NALTREXONE HCL 50 MG PO TABS
50.0000 mg | ORAL_TABLET | Freq: Every day | ORAL | Status: DC
  Administered 2023-04-21 – 2023-04-22 (×2): 50 mg via ORAL
  Filled 2023-04-21 (×2): qty 1

## 2023-04-21 MED ORDER — LACTULOSE 10 GM/15ML PO SOLN
10.0000 g | Freq: Every day | ORAL | Status: DC
  Filled 2023-04-21: qty 30

## 2023-04-21 NOTE — TOC Progression Note (Signed)
Transition of Care [facility]) - Progression Note    Patient Details  Name: Dan Henry MRN: [id number] Date of Birth: May 12, 1950  Transition of Care [facility]) CM/SW Contact  Erin Sons, [place] Phone Number: 04/21/2023, 2:19 PM  Clinical Narrative:     [facility] is approved 12/20-- 12/24 though [facility] cannot admit until tomorrow. Auth ID [id number] MD notified. TOC will continue to follow. Plan to DC to [facility] tomorrow.   Expected Discharge Plan: Skilled Nursing Facility Barriers to Discharge: No SNF bed  Expected Discharge Plan and Services     Post Acute Care Choice: Skilled Nursing Facility Living arrangements for the past 2 months: Apartment                                       Social Determinants of Health (SDOH) Interventions SDOH Screenings   Food Insecurity: Food Insecurity Present (04/13/2023)  Housing: High Risk (04/13/2023)  Transportation Needs: Unmet Transportation Needs (04/13/2023)  Utilities: At Risk (04/13/2023)  Depression (PHQ2-9): Low Risk  (10/22/2020)  Recent Concern: Depression (PHQ2-9) - Medium Risk (09/23/2020)  Tobacco Use: Low Risk  (04/13/2023)    Readmission Risk Interventions     No data to display

## 2023-04-21 NOTE — Progress Notes (Signed)
Physical Therapy Treatment Patient Details Name: Dan Henry MRN: [id number] DOB: 1950-12-09 Today's Date: 04/21/2023   History of Present Illness Pt is a 72 y.o. male who presented 04/12/23 with concerns of persistent shaking and hallucinations. Being worked up for seizure in setting of alcohol withdrawal. Pt also found to have acute bil PE. 12/18 stay complicated by aspiration pneumonia PMH: HTN, alcohol use disorder    PT Comments  Pt continues to progress towards goals. Pt performing at Min a to CGA throughout session for functional mobility. Pt continues at a high risk for falls due to mentation and balance. Due to pt current functional status, home set up and available assistance at home recommending skilled physical therapy services < 3 hours/day in order to address strength, balance and functional mobility to decrease risk for falls, injury, immobility, skin break down and re-hospitalization.      If plan is discharge home, recommend the following: Assistance with cooking/housework;Assist for transportation;Supervision due to cognitive status;A little help with walking and/or transfers     Equipment Recommendations  Rolling walker (2 wheels)       Precautions / Restrictions Precautions Precautions: Fall Restrictions Weight Bearing Restrictions Per Provider Order: No     Mobility  Bed Mobility Overal bed mobility: Modified Independent Bed Mobility: Supine to Sit, Sit to Supine     Supine to sit: Supervision, HOB elevated, Used rails Sit to supine: HOB elevated, Supervision        Transfers Overall transfer level: Needs assistance Equipment used: Rolling walker (2 wheels) Transfers: Sit to/from Stand Sit to Stand: Contact guard assist           General transfer comment: CGA from EOB this session with verbal cues for safe hand placement to prevent pulling up from RW.    Ambulation/Gait Ambulation/Gait assistance: Contact guard assist Gait Distance (Feet): 150  Feet Assistive device: Rolling walker (2 wheels) Gait Pattern/deviations: Decreased step length - right, Decreased step length - left, Decreased stride length, Shuffle, Knee flexed in stance - left Gait velocity: decreased Gait velocity interpretation: <1.31 ft/sec, indicative of household ambulator   General Gait Details: pt continues with intermittent shuffling of bil feet during gait. Pt was able to improve progressively with gait as pt continued to ambulate with verbal cueing and auditory cues due to squeaking of the foot when it hit the floor.      Balance Overall balance assessment: Needs assistance Sitting-balance support: Feet supported Sitting balance-Leahy Scale: Good     Standing balance support: Bilateral upper extremity supported, During functional activity, Reliant on assistive device for balance Standing balance-Leahy Scale: Fair Standing balance comment: no overt LOB        Cognition Arousal: Alert   Overall Cognitive Status: Within Functional Limits for tasks assessed       Following Commands: Follows one step commands consistently Safety/Judgement: Decreased awareness of safety                     Pertinent Vitals/Pain Pain Assessment Pain Assessment: No/denies pain     PT Goals (current goals can now be found in the care plan section) Acute Rehab PT Goals Patient Stated Goal: to improve PT Goal Formulation: With patient Time For Goal Achievement: 04/27/23 Potential to Achieve Goals: Good Progress towards PT goals: Progressing toward goals    Frequency    Min 1X/week      PT Plan  Continue with current POC        AM-PAC PT "6 Clicks"  Mobility   Outcome Measure  Help needed turning from your back to your side while in a flat bed without using bedrails?: A Little Help needed moving from lying on your back to sitting on the side of a flat bed without using bedrails?: A Little Help needed moving to and from a bed to a chair  (including a wheelchair)?: A Little Help needed standing up from a chair using your arms (e.g., wheelchair or bedside chair)?: A Little Help needed to walk in hospital room?: A Little Help needed climbing 3-5 steps with a railing? : A Little 6 Click Score: 18    End of Session Equipment Utilized During Treatment: Gait belt Activity Tolerance: Patient tolerated treatment well Patient left: with call bell/phone within reach;in bed;with bed alarm set Nurse Communication: Mobility status PT Visit Diagnosis: Unsteadiness on feet (R26.81);Other abnormalities of gait and mobility (R26.89);Muscle weakness (generalized) (M62.81);Difficulty in walking, not elsewhere classified (R26.2)     Time: 3086-5784 PT Time Calculation (min) (ACUTE ONLY): 14 min  Charges:    $Gait Training: 8-22 mins PT General Charges $$ ACUTE PT VISIT: 1 Visit                     Harrel Carina, DPT, CLT  Acute Rehabilitation Services Office: [phone] (Secure chat preferred)    Claudia Desanctis 04/21/2023, 3:52 PM

## 2023-04-21 NOTE — Plan of Care (Signed)

## 2023-04-21 NOTE — TOC Progression Note (Signed)
Transition of Care [facility]) - Progression Note    Patient Details  Name: Dan Henry MRN: [id number] Date of Birth: 05/19/1950  [facility]) CM/SW Contact  Erin Sons, [place] Phone Number: 04/21/2023, 1:00 PM  Clinical Narrative:     SNF auth still pending at this time. TOC will continue to follow.   Expected Discharge Plan: Skilled Nursing Facility Barriers to Discharge: Continued Medical Work up, English as a second language teacher, Active Substance Use - Placement  Expected Discharge Plan and Services     Post Acute Care Choice: Skilled Nursing Facility Living arrangements for the past 2 months: Apartment                                       Social Determinants of Health (SDOH) Interventions SDOH Screenings   Food Insecurity: Food Insecurity Present (04/13/2023)  Housing: High Risk (04/13/2023)  Transportation Needs: Unmet Transportation Needs (04/13/2023)  Utilities: At Risk (04/13/2023)  Depression (PHQ2-9): Low Risk  (10/22/2020)  Recent Concern: Depression (PHQ2-9) - Medium Risk (09/23/2020)  Tobacco Use: Low Risk  (04/13/2023)    Readmission Risk Interventions     No data to display

## 2023-04-21 NOTE — Progress Notes (Signed)
Speech Language Pathology Treatment: Dysphagia  Patient Details Name: Dan Henry MRN: [id number] DOB: 01/31/1951 Today's Date: 04/21/2023 Time: 4098-1191 SLP Time Calculation (min) (ACUTE ONLY): 9 min  Assessment / Plan / Recommendation Clinical Impression  Pt continues to exhibit consistent coughing during meals and says he coughs when he is not eating as well. He has history of ETOH and may have an esophageal component. Given that he is continuing to cough today and yesterday and pneumonia, will proceed with MBS today at around 2:30-2:45 to further assess. He can eat and drink per orders until MBS.    HPI HPI: Pt is a 72 y.o. male who presented 04/12/23 with concerns of persistent shaking and hallucinations. Being worked up for seizure in setting of alcohol withdrawal. Pt also found to have acute bil PE. PMH: HTN, alcohol use disorder. Found to have possible pna.      SLP Plan  MBS      Recommendations for follow up therapy are one component of a multi-disciplinary discharge planning process, led by the attending physician.  Recommendations may be updated based on patient status, additional functional criteria and insurance authorization.    Recommendations  Diet recommendations: Dysphagia 3 (mechanical soft);Thin liquid Liquids provided via: Straw;Cup Medication Administration: Whole meds with liquid Supervision: Patient able to self feed Compensations: Slow rate;Small sips/bites Postural Changes and/or Swallow Maneuvers: Seated upright 90 degrees                  Oral care BID   Intermittent Supervision/Assistance Dysphagia, oropharyngeal phase (R13.12)     MBS     Royce Macadamia  04/21/2023, 10:12 AM

## 2023-04-21 NOTE — Progress Notes (Signed)
Modified Barium Swallow Study  Patient Details  Name: Dan Henry MRN: [id number] Date of Birth: 12-04-1950  Today's Date: 04/21/2023  Modified Barium Swallow completed.  Full report located under Chart Review in the Imaging Section.  History of Present Illness Pt is a 72 y.o. male who presented 04/12/23 with concerns of persistent shaking and hallucinations. Being worked up for seizure in setting of alcohol withdrawal. Pt also found to have acute bil PE. PMH: HTN, alcohol use disorder. Found to have possible pna. Frequent coughing during meals.   Clinical Impression Pt exhibits mild pharyngoesophageal dysphagia with suspected esophgeal involvement. Pt's oral control, cohesion and transit were within normal limits. The timing, strength and coordination of his swallow was normal to prevent penetration or aspiration from occuring. He did have a prominent cricopharyngeus muscle with mildly decreased pharyngoesophageal (PES) distention causing minimal retention and some backflow toward pyriform sinuses intermittently. Pt had a strong cough without material seen in laryngeal area. Barium pill stopped in the proximal esophagus and was not transited with large sips thin liquid. Successful transit through esophagus required 2 boluses of applesauce. Suspect coughing during/after meals may be due to esophageal retention. Although he has only 2 mandibular teeth he was able to functionally masticate solid texture. Recommend diet upgrade to regular, continue thin liquid, pills with liquid then follow with bites puree and remain in upright postion minimum 45 minutes after meals. ST will continue to see to reiterate strategies while he is on acute venue. Factors that may increase risk of adverse event in presence of aspiration Dan Henry & Dan Henry 2021):    Swallow Evaluation Recommendations Recommendations: PO diet PO Diet Recommendation: Regular;Thin liquids (Level 0) Liquid Administration via:  Straw;Cup Medication Administration: Whole meds with liquid (follow with bites of applesauce) Supervision: Patient able to self-feed Swallowing strategies  :  (bites of puree after pills to assist in transit) Postural changes: Position pt fully upright for meals;Stay upright 30-60 min after meals Oral care recommendations: Oral care BID (2x/day)      Dan Henry 04/21/2023,3:42 PM

## 2023-04-21 NOTE — Progress Notes (Signed)
HD#9 SUBJECTIVE:  Patient Summary: Dan Henry is a 72 yo male with PMH of AUD w/ previous delirium tremens/multiple hospitalizations, HTN, HLD, and anxiety who presented with concerns of persistent shaking and was admitted to the Internal Medicine Teaching Service on 12/11 for seizures due to alcohol withdrawal and was found to have acute bilateral pulmonary emboli in which he is on eliquis for.  During hospitalization, he unfortunately aspirated and became septic due to aspiration pneumonia that is currently treated with Augmentin.   Interim History:  He stated feeling well today and that he is ready for discharge. He denied shortness of breath and denied abdominal pain today.   OBJECTIVE:  Vital Signs: Vitals:   04/20/23 2029 04/21/23 0001 04/21/23 0354 04/21/23 0753  BP: (!) 146/69 (!) 140/74 129/76 (!) 151/76  Pulse: 96 95 93 (!) 101  Resp: 18 18 19 17   Temp: 99 F (37.2 C) 99.2 F (37.3 C) 98.6 F (37 C) 98.4 F (36.9 C)  TempSrc: Oral Oral Oral Oral  SpO2: 91% 99% 94% 93%  Weight:      Height:        Physical Exam: Physical Exam: General:NAD, sitting in bed with breakfast Cardiac:regular rate and rhythm, no murmurs auscultated   Pulmonary:normal effort on RA, LLL crackles auscultated Abdominal:non-tender, bowel sounds present  Neuro:alert, awake, moving all four extremities  Skin:warm and dry  Psych: normal mood and affect     Latest Ref Rng & Units 04/21/2023    3:57 AM 04/20/2023    6:20 AM 04/18/2023    5:21 AM  CBC  WBC 4.0 - 10.5 K/uL 9.3  10.1  7.0   Hemoglobin 13.0 - 17.0 g/dL 27.2  53.6  64.4   Hematocrit 39.0 - 52.0 % 32.9  30.6  35.4   Platelets 150 - 400 K/uL 283  239  230         Latest Ref Rng & Units 04/21/2023    3:57 AM 04/20/2023    6:20 AM 04/19/2023    5:58 AM  CMP  Glucose 70 - 99 mg/dL 96  95  034   BUN 8 - 23 mg/dL 13  17  20    Creatinine 0.61 - 1.24 mg/dL 7.42  5.95  6.38   Sodium 135 - 145 mmol/L 139  138  137   Potassium  3.5 - 5.1 mmol/L 3.8  3.6  4.1   Chloride 98 - 111 mmol/L 108  109  107   CO2 22 - 32 mmol/L 21  23  21    Calcium 8.9 - 10.3 mg/dL 9.4  8.8  9.2     Ammonia 04/17/2023: <10  ASSESSMENT/PLAN:  Assessment: Principal Problem:   Acute pulmonary embolism (HCC) Active Problems:   Alcohol use disorder, severe, in early remission (HCC)   Seizure (HCC)   Alcohol withdrawal syndrome, with delirium (HCC)   AKI (acute kidney injury) (HCC)   Sepsis (HCC)   Hypoxic respiratory failure (HCC)   Aspiration pneumonia (HCC)   Plan:  Sepsis 2/2 Aspiration Pneumonia, resolving  Patient appears at baseline, remained afebrile, and is saturating well on RA. He is doing well on Augmentin BID -Augmentin BID for a total treatment course of 5 days, day 3/5  -Blood cultures NGTD -Remains hemodynamically stable  -Continue SLP recommendations of DYS 3 diet, and MBS  -Patient is awaiting SNF placement  -PT/OT -Incentive Spirometer    Acute Bilateral Pulmonary Embolism TTE 12/12 unremarkable. No evidence of DVT in lower extremities on bilateral  LE doppler US per preliminary report. Transitioned to DOAC 12/12.  Plan: - Eliquis 5 mg BID started on 12/19 (s/p 10mg  BID)    - Monitor vitals  ____________________________ Resolved Problems  #Seizure 2/2 Alcohol Withdrawal, s/p librium taper: resolved  Given IV Keppra 2 g on admission to ED, per neurology, no need for continuing antiepileptic or outpatient neurology follow up at this time unless patient has a repeat episode. Last alcohol use reported to be on day of admission.   Plan:  - Finished librium taper on 12/16 - Folic acid 1 mg daily  - Thiamine 100 mg tablet  -Naltrexone today, 50mg  daily  HTN Hypertensive Urgency- resolved Patient initially presented with SBP > 180, as high as 210 since admission.  History of HTN, home regimen includes hydrochlorothiazide 12.5 mg daily, irbesartan 300 mg daily and metoprolol 25 mg BID, not on meds for past  month.  Can consider adding other antihypertensives as appropriate.  - Continue home dose of amlodipine 10 mg daily and metoprolol 25 mg BID - Irbesartan 300 mg daily  - Will continue to monitor and consider starting hydrochlorothiazide if patient becomes hypertensive    Hepatic Steatosis on CT Hyperammonemia- resolved   AKI- Resolved Elevated CK, concern for Rhabdomyolysis- improved Baseline Cr ~1.0 (0.97 7 months ago, 1.02 about a year ago).   AGMA- resolved    Hyperglycemia- resolved -Will monitor CBGs for now.    Elevated Troponin- peaked ECG on admission showing ST depression in inferior leads. Patient denied chest pain. Troponin I 31 > 207 > 209 > 149 peaked. Will monitor, can repeat ECG if patient with acute chest pain. -Echocardiogram shows LVEF 65-70% without LV regional wall motion abnormalities   RLL Nodule Incidental finding on CTA chest: 7 mm noncalcified nodule in the right lower lobe. Will need outpatient follow up. If patient is high risk for malignancy, Consider non-contrast Chest CT at 18-24 months, if low risk Chest CT is optional.    HLD Chronic. Initially held home atorvastatin 20 mg daily due to CK/concern for rhabdomyolysis. Resumed 12/14.   Best Practice: Diet: DYS 3  VTE: NOAC Code: Full  Signature: Faith Rogue, D.O.  Internal Medicine Resident, PGY-1 [facility]  Pager: [phone] 10:21 AM, 04/21/2023   Please contact the on call pager after 5 pm and on weekends at [phone].

## 2023-04-22 DIAGNOSIS — R1319 Other dysphagia: Secondary | ICD-10-CM | POA: Insufficient documentation

## 2023-04-22 DIAGNOSIS — I2699 Other pulmonary embolism without acute cor pulmonale: Secondary | ICD-10-CM | POA: Diagnosis not present

## 2023-04-22 MED ORDER — AMOXICILLIN-POT CLAVULANATE 875-125 MG PO TABS: 1.0000 | ORAL_TABLET | Freq: Two times a day (BID) | ORAL | Status: AC

## 2023-04-22 MED ORDER — FOLIC ACID 1 MG PO TABS: 1.0000 mg | ORAL_TABLET | Freq: Every day | ORAL | Status: AC

## 2023-04-22 MED ORDER — APIXABAN 5 MG PO TABS: 5.0000 mg | ORAL_TABLET | Freq: Two times a day (BID) | ORAL | Status: DC

## 2023-04-22 MED ORDER — NALTREXONE HCL 50 MG PO TABS: 50.0000 mg | ORAL_TABLET | Freq: Every day | ORAL | Status: DC

## 2023-04-22 NOTE — TOC Transition Note (Addendum)
Transition of Care [facility]) - Discharge Note   Patient Details  Name: Dan Henry MRN: [id number] Date of Birth: 02-07-1951  Transition [facility] CM/SW Contact:  Deatra Robinson, [place] Phone Number: 04/22/2023, 1:24 PM   Clinical Narrative:  Pt for dc to [facility] today. Confirmed with Kia in admissions they are prepared to admit pt to room 130A. Pt's niece aware of dc and reports agreeable. RN provided with number for report and [facility] arranged for transport. SW signing off at dc.   Dellie Burns, MSW, LCSW [phone] (coverage)       Final next level of care: Skilled Nursing Facility Barriers to Discharge: Barriers Resolved   Patient Goals and CMS Choice Patient states their goals for this hospitalization and ongoing recovery are:: to get better CMS Medicare.gov Compare Post Acute Care list provided to:: Patient Choice offered to / list presented to : Patient [place] ownership interest in [facility].provided to:: Patient    Discharge Placement              Patient chooses bed at: [facility] Patient to be transferred to facility by: PT[facility] Name of family member notified: Michelle/Dtr Patient and family notified of of transfer: 04/22/23  Discharge Plan and Services Additional resources added to the After Visit Summary for       Post Acute Care Choice: Skilled Nursing Facility                               Social Drivers of Health (SDOH) Interventions SDOH Screenings   Food Insecurity: Food Insecurity Present (04/13/2023)  Housing: High Risk (04/13/2023)  Transportation Needs: Unmet Transportation Needs (04/13/2023)  Utilities: At Risk (04/13/2023)  Depression (PHQ2-9): Low Risk  (10/22/2020)  Recent Concern: Depression (PHQ2-9) - Medium Risk (09/23/2020)  Tobacco Use: Low Risk  (04/13/2023)     Readmission Risk Interventions     No data to display

## 2023-04-22 NOTE — Plan of Care (Signed)

## 2023-04-22 NOTE — Progress Notes (Signed)
Called report to [facility]

## 2023-04-22 NOTE — Discharge Summary (Signed)
Name: Dan Henry MRN: [id number] DOB: 06/23/1950 72 y.o. PCP: Default, Provider, MD  Date of Admission: 04/12/2023  6:32 PM Date of Discharge: 04/22/2023 11:26 AM Attending Physician: Dan Henry, *  Discharge diagnosis: Active Problems:   Alcohol use disorder, severe, in early remission (HCC)   Esophageal dysphagia  Principal Problem (Resolved):   Acute pulmonary embolism (HCC) Resolved Problems:   Seizure (HCC)   Alcohol withdrawal syndrome, with delirium (HCC)   AKI (acute kidney injury) (HCC)   Sepsis (HCC)   Hypoxic respiratory failure (HCC)   Aspiration pneumonia (HCC)   Discharge medications: Allergies as of 04/22/2023   No Known Allergies      Medication List     STOP taking these medications    CertaVite/Antioxidants Tabs   feeding supplement Liqd   hydrochlorothiazide 12.5 MG tablet Commonly known as: HYDRODIURIL   metoprolol tartrate 25 MG tablet Commonly known as: LOPRESSOR   QUEtiapine 25 MG tablet Commonly known as: SEROquel       TAKE these medications    amLODipine 10 MG tablet Commonly known as: NORVASC Take 1 tablet (10 mg total) by mouth daily.   amoxicillin-clavulanate 875-125 MG tablet Commonly known as: AUGMENTIN Take 1 tablet by mouth every 12 (twelve) hours for 3 doses.   apixaban 5 MG Tabs tablet Commonly known as: ELIQUIS Take 1 tablet (5 mg total) by mouth 2 (two) times daily.   aspirin EC 81 MG tablet Take 81 mg by mouth daily. Swallow whole.   atorvastatin 20 MG tablet Commonly known as: LIPITOR Take 20 mg by mouth daily.   folic acid 1 MG tablet Commonly known as: FOLVITE Take 1 tablet (1 mg total) by mouth daily.   irbesartan 300 MG tablet Commonly known as: AVAPRO Take 1 tablet (300 mg total) by mouth daily.   naltrexone 50 MG tablet Commonly known as: DEPADE Take 1 tablet (50 mg total) by mouth daily.   nitroGLYCERIN 0.4 MG SL tablet Commonly known as: NITROSTAT Place 1 tablet (0.4  mg total) under the tongue every 5 (five) minutes as needed for chest pain.   thiamine 100 MG tablet Commonly known as: VITAMIN B1 Take 1 tablet (100 mg total) by mouth daily. Start taking 1 tablet daily on 02/21/22.        Follow-up appointments:  Follow-up Information     [facility] Follow up.   Why: Call for an appointment with your primary care doctor at [facility] after you return home.               Disposition and recommendations: [name] is a 72 y.o. year old admitted for seizure due to alcohol withdrawal, also treated for incidentally found pulmonary embolism.  Hospital course complicated by aspiration pneumonia with sepsis.  Discharged on hospital day 10 in fair condition to SNF for subacute rehab.  Seizure due to alcohol withdrawal Not discharged on antiseizure medicines.  Alcohol use disorder Discharged with naltrexone, folic acid, and thiamine.  Hypertension Initially hypertensive in setting of alcohol withdrawal seizure.  Then antihypertensives were held when he became septic.  Irbesartan alone was restarted on discharge.  Reassess need for HCTZ 12.5 mg and metoprolol 25 mg twice daily, these were discontinued on discharge  Right lower lobe lung nodule 7 mm noncalcified nodule, consider follow-up with noncontrast chest CT at 18 to 24 months.  Acute bilateral pulmonary embolism Incidental finding on admission.  Discharged on Eliquis 5 mg twice daily.  Consider outpatient  hypercoagulable workup.  Reassess need for chronic anticoagulation around May 2025, risks may outweigh benefits in this person at high risk for bleeding complications.  Dysphagia MBS during admission shows retention of pill in proximal esophagus requiring boluses of pure like applesauce to complete transit.  Regular diet with thin liquids okay.  Sepsis Due to aspiration pneumonia.  Blood cultures no growth at 3 days on discharge, still pending final  report.  Finish Augmentin, 3 more doses, as outpatient.  Hospital course: Seizure In setting of alcohol withdrawal.  With CK elevation.  Treated with a short course of IV levetiracetam but this was not continued after his withdrawal syndrome resolved.  Severe alcohol use disorder With several admissions for alcohol withdrawal syndrome.  Treated with Librium taper to good effect.  Discharged on naltrexone for alcohol cravings.  Bilateral pulmonary embolism Incidental finding on CT angiogram of chest to workup tachycardia.  Overall small clot burden.  Started on apixaban.  Unprovoked, may benefit from hypercoagulable workup in outpatient setting.  Continue DOAC for at least 6 months then reassess for need of chronic anticoagulation.  Hypertension Severely hypertensive shortly after seizure.  Antihypertensive medicines, to which he was nonadherent, or restarted.  Then these were stopped when he became septic.  Irbesartan was restarted prior to discharge but HCTZ and metoprolol were held.  Sepsis From aspiration pneumonia and with hypoxic respiratory failure.  Got better quickly with a course of amoxicillin/clavulanate.  This antibiotic was continued upon discharge, for 3 more doses.  Dysphagia Described as "pharyngoesophageal dysphagia with suspected esophageal involvement" on modified barium swallow study.  Attributed to prominent cricopharyngeus muscle and mildly decreased pharyngoesophageal distention.  Needs boluses of pured food like applesauce to follow pills.  Right lower lobe lung nodule 7 mm noncalcified.  May benefit from follow-up in 18 to 24 months with noncontrast chest CT if he is deemed to be high risk for lung cancer at that time.  Discharge exam: Feeling "much better."  Eager to leave the hospital.  Would prefer to go home but agrees that going to rehabilitation for strength building is important.   Blood pressure 129/87, pulse 80, temperature 98.2 F (36.8 C), temperature  source Oral, resp. rate 16, height 5\' 4"  (1.626 m), weight 65.9 kg, SpO2 98%.  No distress Heart rate and rhythm are normal, radial pulses are strong Breathing comfortably on room air, scant basilar crackles on the left Skin is warm and dry Alert, speech is normal sounding, no facial asymmetry Pleasant and congruent affect  Pertinent studies and procedures: Recent Results (from the past 29562 hours)  ECHOCARDIOGRAM COMPLETE   Collection Time: 04/13/23 11:31 AM  Result Value   BP 157/95   Single Plane A2C EF 68.6   Single Plane A4C EF 70.4   Calc EF 68.3   S' Lateral 2.30   Area-P 1/2 5.27   Est EF 65 - 70%   Narrative      ECHOCARDIOGRAM REPORT       Patient Name:   CHA PRESS Date of Exam: 04/13/2023 Medical Rec #:  [id number]     Height:       65.0 in Accession #:    [id number]    Weight:       123.5 lb Date of Birth:  1950-08-20     BSA:          1.611 m Patient Age:    72 years      BP:  157/95 mmHg Patient Gender: M             HR:           112 bpm. Exam Location:  Inpatient  Procedure: Cardiac Doppler and Color Doppler  Indications:    pulmonary emboli   History:        Patient has no prior history of Echocardiogram examinations.                 Signs/Symptoms:Fever; Risk Factors:Hypertension.   Sonographer:    Melissa Morford RDCS (AE, PE) Referring Phys: [id number] JULIE MACHEN  IMPRESSIONS    1. Left ventricular ejection fraction, by estimation, is 65 to 70%. The left ventricle has normal function. The left ventricle has no regional wall motion abnormalities. There is moderate concentric left ventricular hypertrophy. Left ventricular  diastolic parameters are indeterminate.  2. Right ventricular systolic function is normal. The right ventricular size is normal. There is normal pulmonary artery systolic pressure.  3. The mitral valve is normal in structure. Trivial mitral valve regurgitation. No evidence of mitral stenosis.  4. The aortic valve is  tricuspid. Aortic valve regurgitation is not visualized. No aortic stenosis is present.  5. The inferior vena cava is normal in size with greater than 50% respiratory variability, suggesting right atrial pressure of 3 mmHg.  6. Cannot exclude a small PFO.  Comparison(s): No prior Echocardiogram.  Conclusion(s)/Recommendation(s): Normal biventricular function without evidence of hemodynamically significant valvular heart disease.  FINDINGS  Left Ventricle: Left ventricular ejection fraction, by estimation, is 65 to 70%. The left ventricle has normal function. The left ventricle has no regional wall motion abnormalities. The left ventricular internal cavity size was normal in size. There is  moderate concentric left ventricular hypertrophy. Left ventricular diastolic parameters are indeterminate.  Right Ventricle: The right ventricular size is normal. No increase in right ventricular wall thickness. Right ventricular systolic function is normal. There is normal pulmonary artery systolic pressure. The tricuspid regurgitant velocity is 2.73 m/s, and  with an assumed right atrial pressure of 3 mmHg, the estimated right ventricular systolic pressure is 32.8 mmHg.  Left Atrium: Left atrial size was normal in size.  Right Atrium: Right atrial size was normal in size.  Pericardium: There is no evidence of pericardial effusion.  Mitral Valve: The mitral valve is normal in structure. Trivial mitral valve regurgitation. No evidence of mitral valve stenosis.  Tricuspid Valve: The tricuspid valve is normal in structure. Tricuspid valve regurgitation is mild . No evidence of tricuspid stenosis.  Aortic Valve: The aortic valve is tricuspid. Aortic valve regurgitation is not visualized. No aortic stenosis is present.  Pulmonic Valve: The pulmonic valve was not well visualized. Pulmonic valve regurgitation is not visualized. No evidence of pulmonic stenosis.  Aorta: The aortic root, ascending aorta,  aortic arch and descending aorta are all structurally normal, with no evidence of dilitation or obstruction.  Venous: The inferior vena cava is normal in size with greater than 50% respiratory variability, suggesting right atrial pressure of 3 mmHg.  IAS/Shunts: Cannot exclude a small PFO.    LEFT VENTRICLE PLAX 2D LVIDd:         3.50 cm     Diastology LVIDs:         2.30 cm     LV e' medial:    7.94 cm/s LV PW:         1.30 cm     LV E/e' medial:  9.2 LV IVS:  1.50 cm     LV e' lateral:   7.40 cm/s LVOT diam:     1.90 cm     LV E/e' lateral: 9.8 LV SV:         64 LV SV Index:   39 LVOT Area:     2.84 cm   LV Volumes (MOD) LV vol d, MOD A2C: 69.2 ml LV vol d, MOD A4C: 59.4 ml LV vol s, MOD A2C: 21.8 ml LV vol s, MOD A4C: 17.6 ml LV SV MOD A2C:     47.4 ml LV SV MOD A4C:     59.4 ml LV SV MOD BP:      46.3 ml  RIGHT VENTRICLE RV Basal diam:  2.60 cm RV S prime:     15.30 cm/s TAPSE (M-mode): 2.3 cm  LEFT ATRIUM             Index        RIGHT ATRIUM           Index LA diam:        3.20 cm 1.99 cm/m   RA Area:     10.60 cm LA Vol (A2C):   36.5 ml 22.65 ml/m  RA Volume:   19.90 ml  12.35 ml/m LA Vol (A4C):   39.5 ml 24.51 ml/m LA Biplane Vol: 37.5 ml 23.27 ml/m  AORTIC VALVE LVOT Vmax:   121.00 cm/s LVOT Vmean:  86.000 cm/s LVOT VTI:    0.224 m   AORTA Ao Root diam: 2.90 cm Ao Asc diam:  3.20 cm  MITRAL VALVE                TRICUSPID VALVE MV Area (PHT): 5.27 cm     TR Peak grad:   29.8 mmHg MV Decel Time: 144 msec     TR Vmax:        273.00 cm/s MV E velocity: 72.80 cm/s MV A velocity: 111.00 cm/s  SHUNTS MV E/A ratio:  0.66         Systemic VTI:  0.22 m                             Systemic Diam: 1.90 cm  Jodelle Red MD Electronically signed by Jodelle Red MD Signature Date/Time: 04/13/2023/3:18:29 PM       Final     *Note: Due to a large number of results and/or encounters for the requested time period, some results have  not been displayed. A complete set of results can be found in Results Review.   Imaging Orders         CT Angio Chest PE W and/or Wo Contrast         DG Chest Portable 1 View         CT HEAD WO CONTRAST ( )         US Abdomen Limited RUQ (LIVER/GB)         DG CHEST PORT 1 VIEW         DG CHEST PORT 1 VIEW         DG Swallowing Func-Speech Pathology         VAS [place] LOWER EXTREMITY VENOUS (DVT)    Lab Orders         Resp panel by RT-PCR (RSV, Flu A&B, Covid) Anterior Nasal Swab         Culture, blood (Routine X 2) w Reflex to ID Panel  CBC with Differential         Comprehensive metabolic panel         Lactic acid, plasma         TSH         Magnesium         Ammonia         Ethanol         Rapid urine drug screen (hospital performed)         Lipase, blood         Urinalysis, w/ Reflex to Culture (Infection Suspected) -Urine, Clean Catch         Protime-INR         Heparin level (unfractionated)         Lactic acid, plasma         Comprehensive metabolic panel         CBC         CK         Magnesium         Hemoglobin A1c         CK         Phosphorus         CBC         RPR         Vitamin B12         Comprehensive metabolic panel         CBC         CK         Glucose, capillary         CK         Glucose, capillary         Glucose, capillary         Glucose, capillary         Basic metabolic panel         CBC         Glucose, capillary         Glucose, capillary         Glucose, capillary         CK         Basic metabolic panel         Hepatic function panel         Basic metabolic panel         Magnesium         Basic metabolic panel         CBC         Magnesium         Basic metabolic panel         Lactic acid, plasma         Basic metabolic panel         Lactic acid, plasma         CBC         Basic metabolic panel         I-stat chem 8, ED (not at [facility], [facility] or [facility])     Discharge Instructions:   Discharge Instructions      To Mr.  Irbin Henry or their caretakers,  They were admitted to [facility] on 04/12/2023 for evaluation and treatment of:  Active Problems:   Alcohol use disorder, severe, in early remission (HCC)   Esophageal dysphagia  Principal Problem (Resolved):   Acute pulmonary embolism (HCC) Resolved Problems:   Seizure (HCC)   Alcohol withdrawal syndrome, with delirium (HCC)  AKI (acute kidney injury) (HCC)   Sepsis (HCC)   Hypoxic respiratory failure (HCC)   Aspiration pneumonia (HCC)  The evaluation suggested seizures related to alcohol use. They were treated antiseizure medicine and medicine for safe alcohol withdrawal.  They also had pneumonia while hospitalized which got better with antibiotics.  They were discharged from the hospital on 04/22/23. I recommend the following after leaving the hospital:   Continue naltrexone for alcohol cravings.  Continue taking the vitamins thiamine and folate.  You do not need to take antiseizure medicines as long as you are not at risk for alcohol withdrawal.  You need to continue taking the blood thinner, apixaban, for blood clots in the lungs.  Continue taking irbesartan for high blood pressure.  Talk with your primary doctor about restarting your other blood pressure medicines.  You had pneumonia while you were hospitalized.  You will finish a course of antibiotics outside of the hospital.  You will take 3 more doses of amoxicillin/clavulanate.  Your swallowing is mildly impaired.  Chew food carefully and follow pills with pured foods like applesauce or mashed potatoes.  You have a small right lung nodule.  This is not causing symptoms.  Most lung nodules are not dangerous.  Talk with your doctor in a year about a repeat CAT scan of your chest to look at this lung nodule again.  For questions about your care plan, until you are able to see your primary doctor: Call [phone]. Dial 0 for the operator. Ask for the internal  medicine resident on call.  Marrianne Mood MD 04/22/2023, 11:19 AM    Information on my medicine - ELIQUIS (apixaban)  Why was Eliquis prescribed for you? Eliquis was prescribed to treat blood clots that may have been found in the veins of your legs (deep vein thrombosis) or in your lungs (pulmonary embolism) and to reduce the risk of them occurring again.  What do You need to know about Eliquis ? The starting dose is 10 mg (two 5 mg tablets) taken TWICE daily for the FIRST SEVEN (7) DAYS, then on  12/19  the dose is reduced to ONE 5 mg tablet taken TWICE daily.  Eliquis may be taken with or without food.   Try to take the dose about the same time in the morning and in the evening. If you have difficulty swallowing the tablet whole please discuss with your pharmacist how to take the medication safely.  Take Eliquis exactly as prescribed and DO NOT stop taking Eliquis without talking to the doctor who prescribed the medication.  Stopping may increase your risk of developing a new blood clot.  Refill your prescription before you run out.  After discharge, you should have regular check-up appointments with your healthcare provider that is prescribing your Eliquis.    What do you do if you miss a dose? If a dose of ELIQUIS is not taken at the scheduled time, take it as soon as possible on the same day and twice-daily administration should be resumed. The dose should not be doubled to make up for a missed dose.  Important Safety Information A possible side effect of Eliquis is bleeding. You should call your healthcare provider right away if you experience any of the following: Bleeding from an injury or your nose that does not stop. Unusual colored urine (red or dark brown) or unusual colored stools (red or black). Unusual bruising for unknown reasons. A serious fall or if you hit your head (even if  there is no bleeding).  Some medicines may interact with Eliquis and might  increase your risk of bleeding or clotting while on Eliquis. To help avoid this, consult your healthcare provider or pharmacist prior to using any new prescription or non-prescription medications, including herbals, vitamins, non-steroidal anti-inflammatory drugs (NSAIDs) and supplements.  This website has more information on Eliquis (apixaban): http://www.eliquis.com/eliquis/home     Marrianne Mood MD 04/22/2023, 11:26 AM

## 2023-04-24 LAB — CULTURE, BLOOD (ROUTINE X 2)
Culture: NO GROWTH
Culture: NO GROWTH

## 2023-07-04 ENCOUNTER — Other Ambulatory Visit: Payer: Self-pay

## 2023-07-04 ENCOUNTER — Emergency Department (HOSPITAL_COMMUNITY)
Admission: EM | Admit: 2023-07-04 | Discharge: 2023-07-04 | Disposition: A | Discharge status: Home / Self Care | Attending: Emergency Medicine | Admitting: Emergency Medicine

## 2023-07-04 ENCOUNTER — Encounter (HOSPITAL_COMMUNITY): Payer: Self-pay | Admitting: Emergency Medicine

## 2023-07-04 DIAGNOSIS — Z7982 Long term (current) use of aspirin: Secondary | ICD-10-CM | POA: Insufficient documentation

## 2023-07-04 DIAGNOSIS — F10930 Alcohol use, unspecified with withdrawal, uncomplicated: Secondary | ICD-10-CM

## 2023-07-04 DIAGNOSIS — Y900 Blood alcohol level of less than 20 mg/100 ml: Secondary | ICD-10-CM | POA: Diagnosis not present

## 2023-07-04 DIAGNOSIS — F10239 Alcohol dependence with withdrawal, unspecified: Secondary | ICD-10-CM | POA: Diagnosis present

## 2023-07-04 DIAGNOSIS — Z7901 Long term (current) use of anticoagulants: Secondary | ICD-10-CM | POA: Insufficient documentation

## 2023-07-04 LAB — CBC WITH DIFFERENTIAL/PLATELET
Abs Immature Granulocytes: 0.02 10*3/uL (ref 0.00–0.07)
Basophils Absolute: 0 10*3/uL (ref 0.0–0.1)
Basophils Relative: 1 %
Eosinophils Absolute: 0 10*3/uL (ref 0.0–0.5)
Eosinophils Relative: 0 %
HCT: 33.4 % — ABNORMAL LOW (ref 39.0–52.0)
Hemoglobin: 11 g/dL — ABNORMAL LOW (ref 13.0–17.0)
Immature Granulocytes: 0 %
Lymphocytes Relative: 18 %
Lymphs Abs: 1.2 10*3/uL (ref 0.7–4.0)
MCH: 28.3 pg (ref 26.0–34.0)
MCHC: 32.9 g/dL (ref 30.0–36.0)
MCV: 85.9 fL (ref 80.0–100.0)
Monocytes Absolute: 0.5 10*3/uL (ref 0.1–1.0)
Monocytes Relative: 8 %
Neutro Abs: 5.1 10*3/uL (ref 1.7–7.7)
Neutrophils Relative %: 73 %
Platelets: 235 10*3/uL (ref 150–400)
RBC: 3.89 MIL/uL — ABNORMAL LOW (ref 4.22–5.81)
RDW: 17.2 % — ABNORMAL HIGH (ref 11.5–15.5)
WBC: 6.9 10*3/uL (ref 4.0–10.5)
nRBC: 0 % (ref 0.0–0.2)

## 2023-07-04 LAB — COMPREHENSIVE METABOLIC PANEL
ALT: 21 U/L (ref 0–44)
AST: 40 U/L (ref 15–41)
Albumin: 3.9 g/dL (ref 3.5–5.0)
Alkaline Phosphatase: 52 U/L (ref 38–126)
Anion gap: 12 (ref 5–15)
BUN: 11 mg/dL (ref 8–23)
CO2: 19 mmol/L — ABNORMAL LOW (ref 22–32)
Calcium: 8.8 mg/dL — ABNORMAL LOW (ref 8.9–10.3)
Chloride: 111 mmol/L (ref 98–111)
Creatinine, Ser: 1.15 mg/dL (ref 0.61–1.24)
GFR, Estimated: >60 mL/min (ref >60)
Glucose, Bld: 108 mg/dL — ABNORMAL HIGH (ref 70–99)
Potassium: 3.6 mmol/L (ref 3.5–5.1)
Sodium: 142 mmol/L (ref 135–145)
Total Bilirubin: 0.9 mg/dL (ref 0.0–1.2)
Total Protein: 7.4 g/dL (ref 6.5–8.1)

## 2023-07-04 LAB — ETHANOL: Alcohol, Ethyl (B): <10 mg/dL (ref <10)

## 2023-07-04 MED ORDER — SODIUM CHLORIDE 0.9 % IV BOLUS
1000.0000 mL | Freq: Once | INTRAVENOUS | Status: AC
  Administered 2023-07-04: 1000 mL via INTRAVENOUS

## 2023-07-04 MED ORDER — LORAZEPAM 2 MG/ML IJ SOLN
2.0000 mg | Freq: Once | INTRAMUSCULAR | Status: AC
Start: 2023-07-04 — End: 2023-07-04
  Administered 2023-07-04: 2 mg via INTRAVENOUS
  Filled 2023-07-04: qty 1

## 2023-07-04 MED ORDER — CHLORDIAZEPOXIDE HCL 25 MG PO CAPS: ORAL_CAPSULE | ORAL | 0 refills | Status: DC

## 2023-07-04 NOTE — ED Triage Notes (Signed)
 From PCP office. PCP believes that pt is going through DT's and withdraw. Last drink was 2 days ago. Has not filled any RX in the past month so the pt had not had his medication.

## 2023-07-04 NOTE — Discharge Instructions (Addendum)
 You have been seen and discharged from the emergency department.  You were treated for alcohol withdrawal.  Your blood work is normal.  Please take the Librium taper as prescribed.  Please follow-up with the resource guide for outpatient support.  Follow-up with your primary provider for further evaluation and further care. Take home medications as prescribed. If you have any worsening symptoms or further concerns for your health please return to an emergency department for further evaluation.

## 2023-07-04 NOTE — ED Provider Notes (Signed)
 [place] EMERGENCY DEPARTMENT AT [facility] Provider Note   CSN: [id number] Arrival date & time: 07/04/23  1649     History  Chief Complaint  Patient presents with   Alcohol Problem    Dan Henry is a 73 y.o. male.  HPI   73 year old male presents emergency department concern for alcohol withdrawal.  Patient states his last drink was about 2 days ago.  He has had withdrawal before, taken Librium at home with success.  Patient is now complaining of tremors, at times visual hallucinations but no seizure-like activity.  He is had mild nausea and feels dehydrated.  Home Medications Prior to Admission medications   Medication Sig Start Date End Date Taking? Authorizing Provider  amLODipine (NORVASC) 10 MG tablet Take 1 tablet (10 mg total) by mouth daily. 08/20/22   Rondel Baton, MD  apixaban (ELIQUIS) 5 MG TABS tablet Take 1 tablet (5 mg total) by mouth 2 (two) times daily. 04/22/23   Marrianne Mood, MD  aspirin EC 81 MG tablet Take 81 mg by mouth daily. Swallow whole.    [provider]  atorvastatin (LIPITOR) 20 MG tablet Take 20 mg by mouth daily. 11/19/22   [provider]  folic acid (FOLVITE) 1 MG tablet Take 1 tablet (1 mg total) by mouth daily. 04/22/23   Marrianne Mood, MD  irbesartan (AVAPRO) 300 MG tablet Take 1 tablet (300 mg total) by mouth daily. Patient not taking: Reported on 04/12/2023 08/20/22   Rondel Baton, MD  naltrexone (DEPADE) 50 MG tablet Take 1 tablet (50 mg total) by mouth daily. 04/22/23   Marrianne Mood, MD  nitroGLYCERIN (NITROSTAT) 0.4 MG SL tablet Place 1 tablet (0.4 mg total) under the tongue every 5 (five) minutes as needed for chest pain. Patient not taking: Reported on 04/12/2023 06/10/20   Verdene Lennert, MD  thiamine (VITAMIN B1) 100 MG tablet Take 1 tablet (100 mg total) by mouth daily. Start taking 1 tablet daily on 02/21/22. Patient not taking: Reported on 04/12/2023 02/22/22   Marrianne Mood,  MD      Allergies    Patient has no known allergies.    Review of Systems   Review of Systems  Constitutional:  Positive for fatigue. Negative for fever.  Respiratory:  Negative for shortness of breath.   Cardiovascular:  Negative for chest pain.  Gastrointestinal:  Negative for abdominal pain, diarrhea and vomiting.  Skin:  Negative for rash.  Neurological:  Positive for tremors. Negative for headaches.  Psychiatric/Behavioral:  Positive for hallucinations.     Physical Exam Updated Vital Signs BP (!) 173/96   Pulse (!) 125   Temp 100.1 F (37.8 C) (Oral)   Resp (!) 27   Ht 5\' 4"  (1.626 m)   Wt 59 kg   SpO2 100%   BMI 22.31 kg/m  Physical Exam Vitals and nursing note reviewed.  Constitutional:      Appearance: Normal appearance.  HENT:     Head: Normocephalic.     Mouth/Throat:     Mouth: Mucous membranes are moist.  Cardiovascular:     Rate and Rhythm: Normal rate.  Pulmonary:     Effort: Pulmonary effort is normal. No respiratory distress.  Abdominal:     Palpations: Abdomen is soft.     Tenderness: There is no abdominal tenderness.  Skin:    General: Skin is warm.  Neurological:     Mental Status: He is alert and oriented to person, place, and time. Mental status  is at baseline.     Comments: Tremulous  Psychiatric:        Mood and Affect: Mood normal.     ED Results / Procedures / Treatments   Labs (all labs ordered are listed, but only abnormal results are displayed) Labs Reviewed  CBC WITH DIFFERENTIAL/PLATELET - Abnormal; Notable for the following components:      Result Value   RBC 3.89 (*)    Hemoglobin 11.0 (*)    HCT 33.4 (*)    RDW 17.2 (*)    All other components within normal limits  COMPREHENSIVE METABOLIC PANEL - Abnormal; Notable for the following components:   CO2 19 (*)    Glucose, Bld 108 (*)    Calcium 8.8 (*)    All other components within normal limits  ETHANOL    EKG EKG Interpretation Date/Time:  Tuesday July 04 2023 18:53:22 EST Ventricular Rate:  98 PR Interval:  139 QRS Duration:  102 QT Interval:  371 QTC Calculation: 474 R Axis:   -3  Text Interpretation: Sinus rhythm Probable left atrial enlargement RSR' in V1 or V2, right VCD or RVH Borderline ST depression, lateral leads Confirmed by Coralee Pesa [phone]) on 07/04/2023 6:54:30 PM  Radiology No results found.  Procedures Procedures    Medications Ordered in ED Medications  sodium chloride 0.9 % bolus 1,000 mL (has no administration in time range)  LORazepam (ATIVAN) injection 2 mg (has no administration in time range)    ED Course/ Medical Decision Making/ A&P                                 Medical Decision Making Amount and/or Complexity of Data Reviewed Labs: ordered.  Risk Prescription drug management.   73 year old male presents emergency department concern for alcohol withdrawal.  Last drink was over 48 hours ago.  He is tachycardic, hypertensive but otherwise well-appearing.  No reported or findings of seizure-like activity.  Blood work is baseline, after IV fluids and a dose of Ativan patient feels significantly improved.  Last CIWA was around 3.  Will plan for outpatient Librium taper which the patient has had success with before and provide outpatient resources.  Patient at this time appears safe and stable for discharge and close outpatient follow up. Discharge plan and strict return to ED precautions discussed, patient verbalizes understanding and agreement.        Final Clinical Impression(s) / ED Diagnoses Final diagnoses:  None    Rx / DC Orders ED Discharge Orders     None         Rozelle Logan, DO 07/04/23 2247

## 2023-12-25 ENCOUNTER — Other Ambulatory Visit: Payer: Self-pay

## 2023-12-25 ENCOUNTER — Encounter (HOSPITAL_COMMUNITY): Payer: Self-pay

## 2023-12-25 ENCOUNTER — Emergency Department (HOSPITAL_COMMUNITY)
Admission: EM | Admit: 2023-12-25 | Discharge: 2023-12-25 | Disposition: A | Discharge status: Home / Self Care | Attending: Emergency Medicine | Admitting: Emergency Medicine

## 2023-12-25 DIAGNOSIS — Z7982 Long term (current) use of aspirin: Secondary | ICD-10-CM | POA: Diagnosis not present

## 2023-12-25 DIAGNOSIS — Z79899 Other long term (current) drug therapy: Secondary | ICD-10-CM | POA: Insufficient documentation

## 2023-12-25 DIAGNOSIS — Z7901 Long term (current) use of anticoagulants: Secondary | ICD-10-CM | POA: Diagnosis not present

## 2023-12-25 DIAGNOSIS — I10 Essential (primary) hypertension: Secondary | ICD-10-CM | POA: Insufficient documentation

## 2023-12-25 NOTE — ED Provider Notes (Signed)
 [place] EMERGENCY DEPARTMENT AT [facility] Provider Note   CSN: [id number] Arrival date & time: 12/25/23  1119     Patient presents with: Hypertension   Dan Henry is a 73 y.o. male.   73 year old male with history of hypertension, AUD, stable angina, history of PE on Eliquis  presenting from PCP office due to asymptomatic hypertension.  Reports he takes amlodipine  and losartan, took both of these today.  Has been compliant with Eliquis . Denies chest pain, shortness of breath, headache, dizziness, vision changes, weakness/numbness, abdominal pain, nausea/vomiting Denies other concerns.  Denies history of kidney disease or aneurysm       Hypertension Pertinent negatives include no chest pain, no abdominal pain, no headaches and no shortness of breath.       Prior to Admission medications   Medication Sig Start Date End Date Taking? Authorizing Provider  amLODipine  (NORVASC ) 10 MG tablet Take 1 tablet (10 mg total) by mouth daily. 08/20/22   Yolande Lamar BROCKS, MD  apixaban  (ELIQUIS ) 5 MG TABS tablet Take 1 tablet (5 mg total) by mouth 2 (two) times daily. 04/22/23   Norrine Sharper, MD  aspirin  EC 81 MG tablet Take 81 mg by mouth daily. Swallow whole.    [provider]  atorvastatin  (LIPITOR) 20 MG tablet Take 20 mg by mouth daily. 11/19/22   [provider]  chlordiazePOXIDE  (LIBRIUM ) 25 MG capsule 50mg  PO TID x 1D, then 25-50mg  PO BID X 1D, then 25-50mg  PO QD X 1D 07/04/23   Horton, Kristie M, DO  folic acid  (FOLVITE ) 1 MG tablet Take 1 tablet (1 mg total) by mouth daily. 04/22/23   Norrine Sharper, MD  irbesartan  (AVAPRO ) 300 MG tablet Take 1 tablet (300 mg total) by mouth daily. Patient not taking: Reported on 04/12/2023 08/20/22   Yolande Lamar BROCKS, MD  naltrexone  (DEPADE) 50 MG tablet Take 1 tablet (50 mg total) by mouth daily. 04/22/23   Norrine Sharper, MD  nitroGLYCERIN  (NITROSTAT ) 0.4 MG SL tablet Place 1 tablet (0.4 mg total)  under the tongue every 5 (five) minutes as needed for chest pain. Patient not taking: Reported on 04/12/2023 06/10/20   Arnett Saunders, MD  thiamine  (VITAMIN B1) 100 MG tablet Take 1 tablet (100 mg total) by mouth daily. Start taking 1 tablet daily on 02/21/22. Patient not taking: Reported on 04/12/2023 02/22/22   Norrine Sharper, MD    Allergies: Patient has no known allergies.    Review of Systems  Constitutional:  Negative for fever.  Eyes:  Negative for visual disturbance.  Respiratory:  Negative for cough, chest tightness, shortness of breath and wheezing.   Cardiovascular:  Negative for chest pain, palpitations and leg swelling.  Gastrointestinal:  Negative for abdominal pain, diarrhea, nausea and vomiting.  Neurological:  Negative for dizziness, weakness, light-headedness, numbness and headaches.    Updated Vital Signs BP (!) 188/97   Pulse 65   Temp (!) 97.5 F (36.4 C) (Oral)   Resp 15   Ht 5' 4 (1.626 m)   Wt 61.2 kg   SpO2 100%   BMI 23.17 kg/m   Physical Exam Vitals and nursing note reviewed.  Constitutional:      General: He is not in acute distress.    Appearance: He is well-developed.  HENT:     Head: Normocephalic and atraumatic.  Eyes:     Conjunctiva/sclera: Conjunctivae normal.  Cardiovascular:     Rate and Rhythm: Normal rate and regular rhythm.     Heart sounds: No  murmur heard. Pulmonary:     Effort: Pulmonary effort is normal. No respiratory distress.     Breath sounds: Normal breath sounds.  Abdominal:     General: Abdomen is flat. There is no distension.     Palpations: Abdomen is soft.     Tenderness: There is no abdominal tenderness.  Musculoskeletal:        General: No swelling.     Cervical back: Neck supple.  Skin:    General: Skin is warm and dry.     Capillary Refill: Capillary refill takes less than 2 seconds.  Neurological:     General: No focal deficit present.     Mental Status: He is alert.     Cranial Nerves: Cranial  nerves 2-12 are intact. No cranial nerve deficit or facial asymmetry.     Sensory: Sensation is intact. No sensory deficit.     Motor: Motor function is intact. No weakness.  Psychiatric:        Mood and Affect: Mood normal.     (all labs ordered are listed, but only abnormal results are displayed) Labs Reviewed - No data to display  EKG: None  Radiology: No results found.   Procedures   Medications Ordered in the ED - No data to display                                  Medical Decision Making 73 year old male with history of hypertension, AUD, stable angina, history of PE on Eliquis  presenting from PCP office due to asymptomatic hypertension.   BP elevated to 237/108, remains asymptomatic. Improved to 188/97 without intervention. He reportedly took his home medications right before his PCP appt which may have been why it was so elevated earlier  EKG without signs of acute ischemia or arrhythmia.  Neurologic exam unremarkable No evidence of endorgan damage in history or on exam  For asymptomatic hypertension, improving on its own with home meds, recommend close outpatient follow-up and titration of his blood pressure medications.  Discussed return precautions for symptomatic hypertension       Final diagnoses:  Asymptomatic hypertension    ED Discharge Orders     None          Romelle Booty, MD 12/25/23 1301    Pamella Ozell LABOR, DO 12/27/23 1637

## 2023-12-25 NOTE — ED Triage Notes (Signed)
 Pt arrives to ED via [facility] from his doctor office for a well check. Denies any complaints at this time. Doctors office sent him over for HTN 237-160 pt denies any complaints at this time.

## 2023-12-25 NOTE — Discharge Instructions (Addendum)
 Your blood pressure is very elevated.  For this I recommend following up closely with your primary care doctor who may choose to change your blood pressure medication regimen.  Please seek medical attention immediately if you experience any chest pain, shortness of breath, severe headache, blurry vision or blindness, numbness or weakness in your arms or legs, severe abdominal pain, nausea or vomiting

## 2024-01-15 ENCOUNTER — Other Ambulatory Visit: Payer: Self-pay

## 2024-01-15 ENCOUNTER — Emergency Department (HOSPITAL_COMMUNITY)

## 2024-01-15 ENCOUNTER — Emergency Department (HOSPITAL_COMMUNITY): Admission: EM | Admit: 2024-01-15 | Discharge: 2024-01-16 | Disposition: A | Discharge status: Home / Self Care

## 2024-01-15 DIAGNOSIS — Z79899 Other long term (current) drug therapy: Secondary | ICD-10-CM | POA: Insufficient documentation

## 2024-01-15 DIAGNOSIS — I10 Essential (primary) hypertension: Secondary | ICD-10-CM | POA: Diagnosis present

## 2024-01-15 DIAGNOSIS — Z7982 Long term (current) use of aspirin: Secondary | ICD-10-CM | POA: Diagnosis not present

## 2024-01-15 DIAGNOSIS — E876 Hypokalemia: Secondary | ICD-10-CM | POA: Insufficient documentation

## 2024-01-15 DIAGNOSIS — I160 Hypertensive urgency: Secondary | ICD-10-CM | POA: Diagnosis present

## 2024-01-15 LAB — CBC WITH DIFFERENTIAL/PLATELET
Abs Immature Granulocytes: 0.02 K/uL (ref 0.00–0.07)
Basophils Absolute: 0 K/uL (ref 0.0–0.1)
Basophils Relative: 1 %
Eosinophils Absolute: 0.1 K/uL (ref 0.0–0.5)
Eosinophils Relative: 2 %
HCT: 36.3 % — ABNORMAL LOW (ref 39.0–52.0)
Hemoglobin: 11.6 g/dL — ABNORMAL LOW (ref 13.0–17.0)
Immature Granulocytes: 0 %
Lymphocytes Relative: 24 %
Lymphs Abs: 1.3 K/uL (ref 0.7–4.0)
MCH: 27.8 pg (ref 26.0–34.0)
MCHC: 32 g/dL (ref 30.0–36.0)
MCV: 87.1 fL (ref 80.0–100.0)
Monocytes Absolute: 0.7 K/uL (ref 0.1–1.0)
Monocytes Relative: 13 %
Neutro Abs: 3.2 K/uL (ref 1.7–7.7)
Neutrophils Relative %: 60 %
Platelets: 165 K/uL (ref 150–400)
RBC: 4.17 MIL/uL — ABNORMAL LOW (ref 4.22–5.81)
RDW: 16.9 % — ABNORMAL HIGH (ref 11.5–15.5)
WBC: 5.4 K/uL (ref 4.0–10.5)
nRBC: 0 % (ref 0.0–0.2)

## 2024-01-15 LAB — BASIC METABOLIC PANEL WITH GFR
Anion gap: 12 (ref 5–15)
BUN: 12 mg/dL (ref 8–23)
CO2: 25 mmol/L (ref 22–32)
Calcium: 9.3 mg/dL (ref 8.9–10.3)
Chloride: 106 mmol/L (ref 98–111)
Creatinine, Ser: 1.08 mg/dL (ref 0.61–1.24)
GFR, Estimated: >60 mL/min (ref >60)
Glucose, Bld: 101 mg/dL — ABNORMAL HIGH (ref 70–99)
Potassium: 3.3 mmol/L — ABNORMAL LOW (ref 3.5–5.1)
Sodium: 143 mmol/L (ref 135–145)

## 2024-01-15 LAB — TROPONIN I (HIGH SENSITIVITY)
Troponin I (High Sensitivity): 9 ng/L (ref <18)
Troponin I (High Sensitivity): 7 ng/L (ref <18)

## 2024-01-15 MED ORDER — AMLODIPINE BESYLATE 5 MG PO TABS
5.0000 mg | ORAL_TABLET | Freq: Once | ORAL | Status: AC
  Administered 2024-01-16: 5 mg via ORAL
  Filled 2024-01-15: qty 1

## 2024-01-15 MED ORDER — AMLODIPINE BESYLATE 5 MG PO TABS: 5.0000 mg | ORAL_TABLET | Freq: Every day | ORAL | 1 refills | Status: DC

## 2024-01-15 MED ORDER — HYDRALAZINE HCL 25 MG PO TABS
25.0000 mg | ORAL_TABLET | Freq: Once | ORAL | Status: AC
  Administered 2024-01-16: 25 mg via ORAL
  Filled 2024-01-15: qty 1

## 2024-01-15 MED ORDER — METOPROLOL TARTRATE 25 MG PO TABS
25.0000 mg | ORAL_TABLET | Freq: Once | ORAL | Status: AC
  Administered 2024-01-15: 25 mg via ORAL
  Filled 2024-01-15: qty 1

## 2024-01-15 MED ORDER — AMLODIPINE BESYLATE 5 MG PO TABS
5.0000 mg | ORAL_TABLET | Freq: Once | ORAL | Status: AC
  Administered 2024-01-15: 5 mg via ORAL
  Filled 2024-01-15: qty 1

## 2024-01-15 MED ORDER — POTASSIUM CHLORIDE CRYS ER 20 MEQ PO TBCR
40.0000 meq | EXTENDED_RELEASE_TABLET | Freq: Once | ORAL | Status: AC
Start: 2024-01-15 — End: 2024-01-15
  Administered 2024-01-15: 40 meq via ORAL
  Filled 2024-01-15: qty 2

## 2024-01-15 NOTE — ED Notes (Signed)
 Patient transported to X-ray

## 2024-01-15 NOTE — ED Provider Notes (Signed)
 Care of patient assumed from Dr. Jackquline.  Patient presents for uncontrolled hypertension.  He is currently asymptomatic. Physical Exam  BP (!) 229/103 (BP Location: Right Arm)   Pulse 73   Temp 98.5 F (36.9 C) (Oral)   Resp 17   Ht 5' 4 (1.626 m)   Wt 61.2 kg   SpO2 100%   BMI 23.17 kg/m   Physical Exam Vitals and nursing note reviewed.  Constitutional:      General: He is not in acute distress.    Appearance: Normal appearance. He is well-developed. He is not ill-appearing, toxic-appearing or diaphoretic.  HENT:     Head: Normocephalic and atraumatic.     Right Ear: External ear normal.     Left Ear: External ear normal.     Nose: Nose normal.     Mouth/Throat:     Mouth: Mucous membranes are moist.  Eyes:     Extraocular Movements: Extraocular movements intact.     Conjunctiva/sclera: Conjunctivae normal.  Cardiovascular:     Rate and Rhythm: Normal rate and regular rhythm.  Pulmonary:     Effort: Pulmonary effort is normal. No respiratory distress.  Abdominal:     General: There is no distension.     Palpations: Abdomen is soft.  Musculoskeletal:        General: No swelling.     Cervical back: Normal range of motion and neck supple.  Skin:    General: Skin is warm and dry.  Neurological:     General: No focal deficit present.     Mental Status: He is alert and oriented to person, place, and time.  Psychiatric:        Mood and Affect: Mood normal.        Behavior: Behavior normal.     Procedures  Procedures  ED Course / MDM    Medical Decision Making Amount and/or Complexity of Data Reviewed Labs: ordered.  Risk Prescription drug management. Decision regarding hospitalization.   On assessment, patient resting comfortably.  He remains asymptomatic.  Blood pressure has improved to 170s over 80s.  I spoke with hospitalist, Dr. Alfornia who will see patient.  Patient's magnesium  was found to be low.  Replacement was ordered.         Melvenia Motto,  MD 01/16/24 [phone]

## 2024-01-15 NOTE — ED Notes (Signed)
 No answer for second trop at

## 2024-01-15 NOTE — ED Provider Notes (Signed)
 [place] EMERGENCY DEPARTMENT AT [facility] Provider Note   CSN: [id number] Arrival date & time: 01/15/24  1324     Patient presents with: Hypertension   Dan Henry is a 73 y.o. male.    Hypertension Pertinent negatives include no chest pain, no abdominal pain and no shortness of breath.    Patient presents because of hypertension.  According to patient, he has a history of hypertension.  Did not take his meds yesterday.  Took his medication this morning.  Patient states he is only on metoprolol  25 mg twice daily.  Once again, has not taken this tonight.  Patient states that he is asymptomatic this point time.  No chest pain or shortness of breath.  No nausea vomit diarrhea.  Patient denies a headache.  No vision changes.  Patient states he went to PCP office today who sent him here because of hypertension.  Denies all complaints at this time.   Previous medical history reviewed : Patient last seen in the ED on December 25, 2023 because of hypertension.  Unremarkable medical workup at that time.      Prior to Admission medications   Medication Sig Start Date End Date Taking? Authorizing Provider  amLODipine  (NORVASC ) 5 MG tablet Take 1 tablet (5 mg total) by mouth daily. 01/15/24  Yes Dan Lavonia SAILOR, MD  apixaban  (ELIQUIS ) 5 MG TABS tablet Take 1 tablet (5 mg total) by mouth 2 (two) times daily. 04/22/23   Dan Sharper, MD  aspirin  EC 81 MG tablet Take 81 mg by mouth daily. Swallow whole.    [provider]  atorvastatin  (LIPITOR) 20 MG tablet Take 20 mg by mouth daily. 11/19/22   [provider]  chlordiazePOXIDE  (LIBRIUM ) 25 MG capsule 50mg  PO TID x 1D, then 25-50mg  PO BID X 1D, then 25-50mg  PO QD X 1D 07/04/23   Horton, Kristie M, DO  folic acid  (FOLVITE ) 1 MG tablet Take 1 tablet (1 mg total) by mouth daily. 04/22/23   Dan Sharper, MD  irbesartan  (AVAPRO ) 300 MG tablet Take 1 tablet (300 mg total) by mouth daily. Patient not taking:  Reported on 04/12/2023 08/20/22   Dan Lamar BROCKS, MD  naltrexone  (DEPADE) 50 MG tablet Take 1 tablet (50 mg total) by mouth daily. 04/22/23   Dan Sharper, MD  nitroGLYCERIN  (NITROSTAT ) 0.4 MG SL tablet Place 1 tablet (0.4 mg total) under the tongue every 5 (five) minutes as needed for chest pain. Patient not taking: Reported on 04/12/2023 06/10/20   Dan Saunders, MD  thiamine  (VITAMIN B1) 100 MG tablet Take 1 tablet (100 mg total) by mouth daily. Start taking 1 tablet daily on 02/21/22. Patient not taking: Reported on 04/12/2023 02/22/22   Dan Sharper, MD    Allergies: Patient has no known allergies.    Review of Systems  Constitutional:  Negative for chills and fever.  HENT:  Negative for ear pain and sore throat.   Eyes:  Negative for pain and visual disturbance.  Respiratory:  Negative for cough and shortness of breath.   Cardiovascular:  Negative for chest pain and palpitations.  Gastrointestinal:  Negative for abdominal pain and vomiting.  Genitourinary:  Negative for dysuria and hematuria.  Musculoskeletal:  Negative for arthralgias and back pain.  Skin:  Negative for color change and rash.  Neurological:  Negative for seizures and syncope.  All other systems reviewed and are negative.   Updated Vital Signs BP (!) 229/103 (BP Location: Right Arm)   Pulse 73  Temp 98.5 F (36.9 C) (Oral)   Resp 17   Ht 5' 4 (1.626 Henry)   Wt 61.2 kg   SpO2 100%   BMI 23.17 kg/Henry   Physical Exam Vitals and nursing note reviewed.  Constitutional:      General: He is not in acute distress.    Appearance: He is well-developed.  HENT:     Head: Normocephalic and atraumatic.  Eyes:     Conjunctiva/sclera: Conjunctivae normal.  Cardiovascular:     Rate and Rhythm: Normal rate and regular rhythm.     Heart sounds: No murmur heard. Pulmonary:     Effort: Pulmonary effort is normal. No respiratory distress.     Breath sounds: Normal breath sounds.  Abdominal:      Palpations: Abdomen is soft.     Tenderness: There is no abdominal tenderness.  Musculoskeletal:        General: No swelling.     Cervical back: Neck supple.  Skin:    General: Skin is warm and dry.     Capillary Refill: Capillary refill takes less than 2 seconds.  Neurological:     Mental Status: He is alert.  Psychiatric:        Mood and Affect: Mood normal.     (all labs ordered are listed, but only abnormal results are displayed) Labs Reviewed  BASIC METABOLIC PANEL WITH GFR - Abnormal; Notable for the following components:      Result Value   Potassium 3.3 (*)    Glucose, Bld 101 (*)    All other components within normal limits  CBC WITH DIFFERENTIAL/PLATELET - Abnormal; Notable for the following components:   RBC 4.17 (*)    Hemoglobin 11.6 (*)    HCT 36.3 (*)    RDW 16.9 (*)    All other components within normal limits  RAPID URINE DRUG SCREEN, HOSP PERFORMED  MAGNESIUM   TROPONIN I (HIGH SENSITIVITY)  TROPONIN I (HIGH SENSITIVITY)    EKG: EKG Interpretation Date/Time:  Monday January 15 2024 14:46:20 EDT Ventricular Rate:  75 PR Interval:  136 QRS Duration:  90 QT Interval:  412 QTC Calculation: 460 R Axis:   31  Text Interpretation: Normal sinus rhythm Minimal voltage criteria for LVH, may be normal variant ( Sokolow-Lyon ) Nonspecific ST abnormality Confirmed by Dan Henry ([id number]) on 01/15/2024 2:48:14 PM  Radiology: [facility] Chest 1 View Result Date: 01/15/2024 CLINICAL DATA:  Chest pain. EXAM: CHEST  1 VIEW COMPARISON:  04/19/2023. FINDINGS: The heart size and mediastinal contours are within normal limits. No focal consolidation, pleural effusion, or pneumothorax. No acute osseous abnormality. IMPRESSION: No acute cardiopulmonary findings. Electronically Signed   By: Dan Sherry Henry.D.   On: 01/15/2024 16:04     Procedures   Medications Ordered in the ED  hydrALAZINE  (APRESOLINE ) tablet 25 mg (has no administration in time range)  amLODipine   (NORVASC ) tablet 5 mg (has no administration in time range)  irbesartan  (AVAPRO ) tablet 300 mg (has no administration in time range)  metoprolol  tartrate (LOPRESSOR ) tablet 25 mg (25 mg Oral Given 01/15/24 2050)  amLODipine  (NORVASC ) tablet 5 mg (5 mg Oral Given 01/15/24 2303)  potassium chloride  SA (KLOR-CON  Henry) CR tablet 40 mEq (40 mEq Oral Given 01/15/24 2303)                                    Medical Decision Making Risk Prescription drug management.  Previous medical history reviewed : Patient last seen in the ED on December 25, 2023 because of hypertension.  Unremarkable medical workup at that time.    Upon exam, patient ANO x 3 GCS 15.  No focal deficits.  Cranials 2 through 12 intact.  Strength and sensation intact in bilateral upper and lower extremities.  No concerns with subdural epidural.  No concern for intraparenchymal bleed  Laboratory workup shows no endorgan damage.  Potassium 3.3.  Replaced orally.  EKG sinus rhythm.  No STEMI.  Patient had cardiac troponins ordered at triage.  Unremarkable.   Patient remains persistently hypertensive despite home dose of metoprolol .  Patient not currently take amlodipine .  Also given 5 mg of amlodipine .  Still hypertensive.  Subsequently ordered another dose of p.o. hydralazine .  Plan will be for the patient to come into the inpatient service due to uncontrolled hypertension with chronically elevated systolics above 200 at baseline.     Final diagnoses:  Hypertension, unspecified type    ED Discharge Orders          Ordered    amLODipine  (NORVASC ) 5 MG tablet  Daily        01/15/24 2308               Dan Lavonia SAILOR, MD 01/16/24 0003

## 2024-01-15 NOTE — Discharge Instructions (Signed)
 Prescriptions have been sent to [facility] for Metoprolol , Amlodipine , and Irbesartan . Please begin taking Metoprolol  this evening and Amlodpine and Irbesartan  Wednesday morning.  Do not miss any doses.  Call your PCP to schedule an appointment within 7 days. If you start experiencing any chest pain or shortness of breath please come back to the ED for further evaluation.

## 2024-01-15 NOTE — ED Provider Triage Note (Signed)
 Emergency Medicine Provider Triage Evaluation Note  Dan Henry , a 73 y.o. male  was evaluated in triage.  Pt complains of high blood pressure.  States he has not taken his high blood pressure medicines in 2 days.  He states he does not know why he did not take them.  Admits occasional chest pain.  Review of Systems  Positive: Chest pain Negative: Headache, blurry vision.   Physical Exam  BP (!) 199/99 (BP Location: Right Arm)   Pulse 73   Temp 98.9 F (37.2 C) (Oral)   Resp 16   Ht 5' 4 (1.626 m)   Wt 61.2 kg   SpO2 99%   BMI 23.17 kg/m  Gen:   Awake, no distress   Resp:  Normal effort  MSK:   Moves extremities without difficulty   Medical Decision Making  Medically screening exam initiated at 2:12 PM.  Appropriate orders placed.  Dan Henry was informed that the remainder of the evaluation will be completed by another provider, this initial triage assessment does not replace that evaluation, and the importance of remaining in the ED until their evaluation is complete.     Dan Henry L, DO 01/15/24 [phone]

## 2024-01-15 NOTE — ED Triage Notes (Signed)
 Sent from office for hypertension.  Pt has no complaints.did not take meds yesterday but states that is the only day he has missed

## 2024-01-16 ENCOUNTER — Encounter (HOSPITAL_COMMUNITY): Payer: Self-pay

## 2024-01-16 DIAGNOSIS — I10 Essential (primary) hypertension: Secondary | ICD-10-CM

## 2024-01-16 DIAGNOSIS — E876 Hypokalemia: Secondary | ICD-10-CM | POA: Diagnosis not present

## 2024-01-16 LAB — RAPID URINE DRUG SCREEN, HOSP PERFORMED
Amphetamines: NOT DETECTED
Barbiturates: NOT DETECTED
Benzodiazepines: NOT DETECTED
Cocaine: NOT DETECTED
Opiates: NOT DETECTED
Tetrahydrocannabinol: NOT DETECTED

## 2024-01-16 LAB — MAGNESIUM: Magnesium: 1.6 mg/dL — ABNORMAL LOW (ref 1.7–2.4)

## 2024-01-16 MED ORDER — IRBESARTAN 300 MG PO TABS
300.0000 mg | ORAL_TABLET | Freq: Every day | ORAL | Status: DC
  Administered 2024-01-16: 300 mg via ORAL
  Filled 2024-01-16: qty 1

## 2024-01-16 MED ORDER — METOPROLOL TARTRATE 25 MG PO TABS: 25.0000 mg | ORAL_TABLET | Freq: Two times a day (BID) | ORAL | 3 refills | Status: AC

## 2024-01-16 MED ORDER — AMLODIPINE BESYLATE 5 MG PO TABS
10.0000 mg | ORAL_TABLET | Freq: Every day | ORAL | Status: DC
Start: 2024-01-16 — End: 2024-01-16

## 2024-01-16 MED ORDER — METOPROLOL TARTRATE 25 MG PO TABS: 25.0000 mg | ORAL_TABLET | Freq: Two times a day (BID) | ORAL | Status: DC

## 2024-01-16 MED ORDER — MAGNESIUM SULFATE 2 GM/50ML IV SOLN
2.0000 g | Freq: Once | INTRAVENOUS | Status: AC
  Administered 2024-01-16: 2 g via INTRAVENOUS
  Filled 2024-01-16: qty 50

## 2024-01-16 MED ORDER — METOPROLOL TARTRATE 25 MG PO TABS
25.0000 mg | ORAL_TABLET | Freq: Two times a day (BID) | ORAL | Status: DC
  Administered 2024-01-16: 25 mg via ORAL
  Filled 2024-01-16: qty 1

## 2024-01-16 MED ORDER — AMLODIPINE BESYLATE 10 MG PO TABS: 10.0000 mg | ORAL_TABLET | Freq: Every day | ORAL | 3 refills | Status: AC

## 2024-01-16 MED ORDER — AMLODIPINE BESYLATE 5 MG PO TABS
10.0000 mg | ORAL_TABLET | Freq: Every day | ORAL | Status: DC
Start: 2024-01-16 — End: 2024-01-16
  Administered 2024-01-16: 10 mg via ORAL
  Filled 2024-01-16: qty 2

## 2024-01-16 MED ORDER — HYDRALAZINE HCL 20 MG/ML IJ SOLN
5.0000 mg | Freq: Once | INTRAMUSCULAR | Status: AC
  Administered 2024-01-16: 5 mg via INTRAVENOUS
  Filled 2024-01-16: qty 1

## 2024-01-16 MED ORDER — IRBESARTAN 300 MG PO TABS: 300.0000 mg | ORAL_TABLET | Freq: Every day | ORAL | 3 refills | Status: AC

## 2024-01-16 NOTE — ED Notes (Signed)
 Admitting NP at bedside

## 2024-01-16 NOTE — Consult Note (Signed)
 [facility] Medical Consultation  Dan Henry FMW:[id number] DOB: 1950/05/19 DOA: 01/15/2024 PCP: [facility] [address] [facility]   Requesting physician: Bernardino Fireman, MD Date of consultation: 01/16/2024 Reason for consultation: Hypertensive urgency  Impression/Recommendations Active Problems:   Hypertensive urgency  #Hypertensive Urgency - Given 5 mg IV hydralazine  in ED and 25 mg p.o. hydralazine  as well as home metoprolol  25 mg p.o. - Added Amlodipine  10 mg and irbesartan  300 mg p.o BP 162/90 at 0845. Patient remains asymptomatic without evidence of end organ dysfunction. - Continue metoprolol  25 mg p.o. twice daily, amlodipine  10 mg p.o. daily, and irbesartan  300 mg p.o. daily - Follow up with PCP within 1 week  #Hypokalemia - Repleted   TR[facility] will sign off at this time and patient will be discharged from ED.  Thank you for your consultation and allowing us  to participate in the care of this patient.   HPI:  Dan Henry presented to [facility] from his primary care office for elevated blood pressure.  In ED BP as high as 219/112.  Mr. Crooker notes that he did not take his medication Sunday and took his morning dose on Monday.  He reports he has been asymptomatic and denies chest pain, dyspnea, headache, vision changes, or leg swelling.  In ED there is no laboratory or clinical evidence of end organ dysfunction.  Troponin 9-->7 and creatinine 1.08. UDS negative.  Review of Systems: As per HPI   Past Medical History:  Diagnosis Date   Acute kidney injury (HCC) 05/08/2017   Alcohol intoxication (HCC) 11/18/2019   Alcohol withdrawal (HCC) 04/23/2019   Alcohol withdrawal delirium (HCC) 02/14/2019   Hypertension    Hypokalemia 07/19/2017   Lactic acidosis 02/20/2018   Malnutrition of moderate degree 10/24/2018   Nosebleed 04/23/2019   Rhabdomyolysis 10/26/2018   Past Surgical History:  Procedure Laterality Date   NO PAST SURGERIES     Social History:  reports that he has never  smoked. He has never used smokeless tobacco. He reports current alcohol use of about 4.0 standard drinks of alcohol per week. He reports that he does not use drugs.  No Known Allergies Family History  Problem Relation Age of Onset   Hypertension Mother    Hypertension Father    Hypertension Sister    Hypertension Sister    Stroke Sister    Cancer Brother        throat    Prior to Admission medications   Medication Sig Start Date End Date Taking? Authorizing Provider  amLODipine  (NORVASC ) 5 MG tablet Take 1 tablet (5 mg total) by mouth daily. 01/15/24  Yes Simon Lavonia SAILOR, MD  aspirin  EC 81 MG tablet Take 81 mg by mouth daily. Swallow whole.   Yes [provider]  atorvastatin  (LIPITOR) 20 MG tablet Take 20 mg by mouth daily. 11/19/22  Yes [provider]  folic acid  (FOLVITE ) 1 MG tablet Take 1 tablet (1 mg total) by mouth daily. 04/22/23  Yes Norrine Sharper, MD  metoprolol  tartrate (LOPRESSOR ) 25 MG tablet Take 25 mg by mouth 2 (two) times daily. 12/16/23  Yes [provider]  thiamine  (VITAMIN B1) 100 MG tablet Take 1 tablet (100 mg total) by mouth daily. Start taking 1 tablet daily on 02/21/22. 02/22/22  Yes Norrine Sharper, MD  amLODipine  (NORVASC ) 5 MG tablet Take 5 mg by mouth daily. Patient not taking: Reported on 01/16/2024    [provider]  nitroGLYCERIN  (NITROSTAT ) 0.4 MG SL tablet Place 1 tablet (0.4 mg total)  under the tongue every 5 (five) minutes as needed for chest pain. Patient not taking: Reported on 04/12/2023 06/10/20   Arnett Saunders, MD   Physical Exam: Blood pressure (!) 159/94, pulse 78, temperature 98.7 F (37.1 C), resp. rate 18, height 5' 4 (1.626 m), weight 61.2 kg, SpO2 100%. Vitals:   01/16/24 0600 01/16/24 0615  BP: (!) 170/104 (!) 159/94  Pulse: 81 78  Resp: 20 18  Temp:    SpO2: 100% 100%    Constitutional: Pleasant, thin older African-American gentleman in NAD, calm, comfortable Eyes: PERRL, lids and  conjunctivae normal ENMT: Mucous membranes are moist.  Respiratory: clear to auscultation bilaterally, no wheezing, no crackles. Normal respiratory effort. No accessory muscle use.  Cardiovascular: Regular rate and rhythm, no murmurs / rubs / gallops. No extremity edema. 2+ radial and pedal PT pulses.  Abdomen: Soft, nontender.  Bowel sounds positive x4 quadrants.  Musculoskeletal:  No joint deformity upper and lower extremities. Good ROM, no contractures. Normal muscle tone.  Skin: Warm, dry.  No rashes, lesions, ulcers noted on exposed skin.  Neurologic:  Alert and oriented x 3.  Labs on Admission:  Basic Metabolic Panel: Recent Labs  Lab 01/15/24 1454  NA 143  K 3.3*  CL 106  CO2 25  GLUCOSE 101*  BUN 12  CREATININE 1.08  CALCIUM  9.3  MG 1.6*   Liver Function Tests: No results for input(s): AST, ALT, ALKPHOS, BILITOT, PROT, ALBUMIN in the last 168 hours. No results for input(s): LIPASE, AMYLASE in the last 168 hours. No results for input(s): AMMONIA in the last 168 hours. CBC: Recent Labs  Lab 01/15/24 1454  WBC 5.4  NEUTROABS 3.2  HGB 11.6*  HCT 36.3*  MCV 87.1  PLT 165   Cardiac Enzymes: No results for input(s): CKTOTAL, CKMB, CKMBINDEX, TROPONINI in the last 168 hours. BNP: Invalid input(s): POCBNP CBG: No results for input(s): GLUCAP in the last 168 hours.  Radiological Exams on Admission: DG Chest 1 View Result Date: 01/15/2024 CLINICAL DATA:  Chest pain. EXAM: CHEST  1 VIEW COMPARISON:  04/19/2023. FINDINGS: The heart size and mediastinal contours are within normal limits. No focal consolidation, pleural effusion, or pneumothorax. No acute osseous abnormality. IMPRESSION: No acute cardiopulmonary findings. Electronically Signed   By: Harrietta Sherry M.D.   On: 01/15/2024 16:04    EKG: Independently reviewed. NSR with incomplete RBBB and left ventricular hypertrophy, HR 75   To reach the provider On-Call:   7AM- 7PM see  care teams to locate the attending and reach out to them via [email or web address] Password: [email or web address] 7PM-7AM contact night-coverage If you still have difficulty reaching the appropriate provider, please page the [facility] (Director on Call) for [facility] on amion for assistance  This document was prepared using Conservation officer, historic buildings and may include unintentional dictation errors.  Rockie Rams FNP-BC, PMHNP-BC Nurse Practitioner [facility] [employer]
